# Patient Record
Sex: Female | Born: 1945
Health system: Southern US, Community
[De-identification: ages and names within clinical notes are randomized; demographics above are authoritative.]

## PROBLEM LIST (undated history)

## (undated) DIAGNOSIS — I10 Essential (primary) hypertension: Secondary | ICD-10-CM

## (undated) DIAGNOSIS — I639 Cerebral infarction, unspecified: Secondary | ICD-10-CM

## (undated) DIAGNOSIS — E039 Hypothyroidism, unspecified: Secondary | ICD-10-CM

## (undated) DIAGNOSIS — F419 Anxiety disorder, unspecified: Secondary | ICD-10-CM

## (undated) DIAGNOSIS — M84312A Stress fracture, left shoulder, initial encounter for fracture: Secondary | ICD-10-CM

## (undated) DIAGNOSIS — F329 Major depressive disorder, single episode, unspecified: Secondary | ICD-10-CM

## (undated) DIAGNOSIS — G2581 Restless legs syndrome: Secondary | ICD-10-CM

## (undated) DIAGNOSIS — F32A Depression, unspecified: Secondary | ICD-10-CM

## (undated) DIAGNOSIS — L509 Urticaria, unspecified: Secondary | ICD-10-CM

## (undated) DIAGNOSIS — G4730 Sleep apnea, unspecified: Secondary | ICD-10-CM

## (undated) HISTORY — DX: Hypothyroidism, unspecified: E03.9

## (undated) HISTORY — DX: Restless legs syndrome: G25.81

## (undated) HISTORY — DX: Urticaria, unspecified: L50.9

## (undated) HISTORY — PX: BREAST BIOPSY: SHX20

## (undated) HISTORY — PX: CHOLECYSTECTOMY: SHX55

## (undated) HISTORY — DX: Depression, unspecified: F32.A

## (undated) HISTORY — DX: Essential (primary) hypertension: I10

## (undated) HISTORY — DX: Major depressive disorder, single episode, unspecified: F32.9

## (undated) HISTORY — DX: Anxiety disorder, unspecified: F41.9

## (undated) HISTORY — DX: Sleep apnea, unspecified: G47.30

## (undated) HISTORY — DX: Stress fracture, left shoulder, initial encounter for fracture: M84.312A

---

## 1998-07-08 ENCOUNTER — Encounter: Admission: RE | Admit: 1998-07-08 | Discharge: 1998-07-08 | Payer: Self-pay | Admitting: Family Medicine

## 1998-11-24 ENCOUNTER — Encounter: Admission: RE | Admit: 1998-11-24 | Discharge: 1998-11-24 | Payer: Self-pay | Admitting: Sports Medicine

## 2000-12-03 ENCOUNTER — Encounter (INDEPENDENT_AMBULATORY_CARE_PROVIDER_SITE_OTHER): Payer: Self-pay

## 2000-12-03 ENCOUNTER — Other Ambulatory Visit: Admission: RE | Admit: 2000-12-03 | Discharge: 2000-12-03 | Payer: Self-pay | Admitting: Obstetrics and Gynecology

## 2001-12-09 ENCOUNTER — Ambulatory Visit (HOSPITAL_COMMUNITY): Admission: RE | Admit: 2001-12-09 | Discharge: 2001-12-09 | Payer: Self-pay | Admitting: Internal Medicine

## 2001-12-09 ENCOUNTER — Encounter: Payer: Self-pay | Admitting: Internal Medicine

## 2002-09-09 ENCOUNTER — Encounter: Payer: Self-pay | Admitting: Family Medicine

## 2002-09-09 ENCOUNTER — Ambulatory Visit (HOSPITAL_COMMUNITY): Admission: RE | Admit: 2002-09-09 | Discharge: 2002-09-09 | Payer: Self-pay | Admitting: Family Medicine

## 2002-11-27 HISTORY — PX: COLONOSCOPY: SHX174

## 2002-11-27 HISTORY — PX: ESOPHAGOGASTRODUODENOSCOPY: SHX1529

## 2003-03-04 ENCOUNTER — Ambulatory Visit (HOSPITAL_COMMUNITY): Admission: RE | Admit: 2003-03-04 | Discharge: 2003-03-04 | Payer: Self-pay | Admitting: Internal Medicine

## 2003-12-16 ENCOUNTER — Ambulatory Visit (HOSPITAL_COMMUNITY): Admission: RE | Admit: 2003-12-16 | Discharge: 2003-12-16 | Payer: Self-pay | Admitting: Internal Medicine

## 2004-11-27 HISTORY — PX: GASTRIC ROUX-EN-Y: SHX5262

## 2004-12-07 ENCOUNTER — Ambulatory Visit (HOSPITAL_COMMUNITY): Admission: RE | Admit: 2004-12-07 | Discharge: 2004-12-07 | Payer: Self-pay | Admitting: Internal Medicine

## 2005-02-12 ENCOUNTER — Emergency Department (HOSPITAL_COMMUNITY): Admission: EM | Admit: 2005-02-12 | Discharge: 2005-02-12 | Payer: Self-pay | Admitting: Emergency Medicine

## 2005-02-22 ENCOUNTER — Ambulatory Visit (HOSPITAL_COMMUNITY): Admission: RE | Admit: 2005-02-22 | Discharge: 2005-02-22 | Payer: Self-pay | Admitting: Surgery

## 2005-03-02 ENCOUNTER — Encounter: Admission: RE | Admit: 2005-03-02 | Discharge: 2005-05-31 | Payer: Self-pay | Admitting: Surgery

## 2005-03-14 ENCOUNTER — Ambulatory Visit (HOSPITAL_COMMUNITY): Admission: RE | Admit: 2005-03-14 | Discharge: 2005-03-14 | Payer: Self-pay | Admitting: Surgery

## 2005-04-21 ENCOUNTER — Ambulatory Visit (HOSPITAL_COMMUNITY): Admission: RE | Admit: 2005-04-21 | Discharge: 2005-04-21 | Payer: Self-pay | Admitting: Family Medicine

## 2005-05-04 ENCOUNTER — Ambulatory Visit: Payer: Self-pay | Admitting: Orthopedic Surgery

## 2005-05-09 ENCOUNTER — Inpatient Hospital Stay (HOSPITAL_COMMUNITY): Admission: RE | Admit: 2005-05-09 | Discharge: 2005-05-12 | Payer: Self-pay | Admitting: Surgery

## 2005-06-16 ENCOUNTER — Encounter: Admission: RE | Admit: 2005-06-16 | Discharge: 2005-09-14 | Payer: Self-pay | Admitting: Obstetrics and Gynecology

## 2005-07-03 ENCOUNTER — Ambulatory Visit: Payer: Self-pay | Admitting: Orthopedic Surgery

## 2005-07-13 ENCOUNTER — Ambulatory Visit: Payer: Self-pay | Admitting: Orthopedic Surgery

## 2005-07-18 ENCOUNTER — Encounter (HOSPITAL_COMMUNITY): Admission: RE | Admit: 2005-07-18 | Discharge: 2005-08-17 | Payer: Self-pay | Admitting: Orthopedic Surgery

## 2005-08-17 ENCOUNTER — Ambulatory Visit: Payer: Self-pay | Admitting: Orthopedic Surgery

## 2005-09-21 ENCOUNTER — Encounter: Admission: RE | Admit: 2005-09-21 | Discharge: 2005-11-26 | Payer: Self-pay | Admitting: Surgery

## 2006-01-04 ENCOUNTER — Encounter: Admission: RE | Admit: 2006-01-04 | Discharge: 2006-04-04 | Payer: Self-pay | Admitting: Surgery

## 2008-06-15 ENCOUNTER — Ambulatory Visit: Payer: Self-pay | Admitting: Orthopedic Surgery

## 2008-06-15 DIAGNOSIS — M7150 Other bursitis, not elsewhere classified, unspecified site: Secondary | ICD-10-CM | POA: Insufficient documentation

## 2008-10-14 ENCOUNTER — Ambulatory Visit: Payer: Self-pay | Admitting: Orthopedic Surgery

## 2008-10-14 DIAGNOSIS — M67919 Unspecified disorder of synovium and tendon, unspecified shoulder: Secondary | ICD-10-CM | POA: Insufficient documentation

## 2008-10-14 DIAGNOSIS — M25519 Pain in unspecified shoulder: Secondary | ICD-10-CM | POA: Insufficient documentation

## 2008-10-14 DIAGNOSIS — M719 Bursopathy, unspecified: Secondary | ICD-10-CM

## 2009-01-14 ENCOUNTER — Encounter (HOSPITAL_COMMUNITY): Admission: RE | Admit: 2009-01-14 | Discharge: 2009-02-13 | Payer: Self-pay | Admitting: Internal Medicine

## 2010-02-07 ENCOUNTER — Ambulatory Visit: Payer: Self-pay | Admitting: Orthopedic Surgery

## 2010-02-07 DIAGNOSIS — M6530 Trigger finger, unspecified finger: Secondary | ICD-10-CM | POA: Insufficient documentation

## 2010-02-07 DIAGNOSIS — D518 Other vitamin B12 deficiency anemias: Secondary | ICD-10-CM | POA: Insufficient documentation

## 2010-02-10 ENCOUNTER — Encounter: Payer: Self-pay | Admitting: Orthopedic Surgery

## 2010-02-10 ENCOUNTER — Encounter (INDEPENDENT_AMBULATORY_CARE_PROVIDER_SITE_OTHER): Payer: Self-pay | Admitting: *Deleted

## 2010-06-02 ENCOUNTER — Ambulatory Visit (HOSPITAL_COMMUNITY): Admission: RE | Admit: 2010-06-02 | Discharge: 2010-06-02 | Payer: Self-pay | Admitting: Orthopedic Surgery

## 2010-06-02 ENCOUNTER — Encounter: Payer: Self-pay | Admitting: Orthopedic Surgery

## 2010-06-06 ENCOUNTER — Ambulatory Visit: Payer: Self-pay | Admitting: Orthopedic Surgery

## 2010-06-06 DIAGNOSIS — M23329 Other meniscus derangements, posterior horn of medial meniscus, unspecified knee: Secondary | ICD-10-CM | POA: Insufficient documentation

## 2010-06-06 DIAGNOSIS — M1710 Unilateral primary osteoarthritis, unspecified knee: Secondary | ICD-10-CM | POA: Insufficient documentation

## 2010-06-06 DIAGNOSIS — IMO0002 Reserved for concepts with insufficient information to code with codable children: Secondary | ICD-10-CM

## 2010-09-14 ENCOUNTER — Ambulatory Visit: Payer: Self-pay | Admitting: Orthopedic Surgery

## 2010-10-12 ENCOUNTER — Encounter: Payer: Self-pay | Admitting: Orthopedic Surgery

## 2010-12-01 ENCOUNTER — Ambulatory Visit (HOSPITAL_COMMUNITY)
Admission: RE | Admit: 2010-12-01 | Discharge: 2010-12-01 | Payer: Self-pay | Source: Home / Self Care | Attending: Internal Medicine | Admitting: Internal Medicine

## 2010-12-06 ENCOUNTER — Ambulatory Visit
Admission: RE | Admit: 2010-12-06 | Discharge: 2010-12-06 | Payer: Self-pay | Source: Home / Self Care | Attending: Orthopedic Surgery | Admitting: Orthopedic Surgery

## 2010-12-18 ENCOUNTER — Encounter: Payer: Self-pay | Admitting: Internal Medicine

## 2010-12-27 NOTE — Letter (Signed)
Summary: External Other  External Other   Imported By: Elvera Maria 02/11/2010 10:41:13  _____________________________________________________________________  External Attachment:    Type:   Image     Comment:   Dr. Chestine Spore appt confimation

## 2010-12-27 NOTE — Assessment & Plan Note (Signed)
Summary: TRIGGER FINGER RECURRING/RT THUMB/BCBS/CAF   Visit Type:  Follow-up Referring Provider:  self Primary Provider:  Dr. Sherwood Gambler  CC:  RIGHT TRIGGER THUMB.  History of Present Illness: I saw Virginia [name] in the office today for a followup visit.  She is a 65 years old woman with the complaint of:  right trigger thumb  Last injection was 02-07-10. Patient states that it helped for several months.  RT THUMB trigger injection  Recurrent trigger finger the patient here for her fourth injection.  We discussed the risk of rupture and she wishes to proceed with injection.  We offered surgical treatment she would rather not  Clicking and locking noted with tenderness over the A1 pulley and nodularity at that level neurovascular exam normal.  She does have good flexion power at the IP joint  Injection given A1 pulley RIGHT thumb standard technique Verbal consent was obtained: The finger was prepped with ethyl chloride and injected with 1:1 injection of .25% sensorcaine, 1cc  and 40 mg of depomedrol, 1cc. There were no complications.   Allergies: 1)  ! Augmentin   Impression & Recommendations:  Problem # 1:  TRIGGER FINGER, THUMB (ICD-727.03) Assessment Deteriorated  Orders: Est. Patient Level III (16109) Injection, Tendon / Ligament (60454) Depo- Medrol 40mg  (J1030)  Patient Instructions: 1)  You have received an injection of cortisone today. You may experience increased pain at the injection site. Apply ice pack to the area for 20 minutes every 2 hours and take 2 xtra strength tylenol every 8 hours. This increased pain will usually resolve in 24 hours. The injection will take effect in 3-10 days.  2)  Please schedule a follow-up appointment as needed.   Orders Added: 1)  Est. Patient Level III [09811] 2)  Injection, Tendon / Ligament [20550] 3)  Depo- Medrol 40mg  [J1030]

## 2010-12-27 NOTE — Assessment & Plan Note (Signed)
Summary: needs injection rt thumb triggering seen before for this no x...   Visit Type:  Follow-up  CC:  right trigger thumb.  History of Present Illness: I saw Virginia [name] in the office today for a followup visit.  She is a 65 years old woman with the complaint of:  right trigger thumb.  Pain for 6 months. with history of clicking and locking.  Previous injection did well about 2 years ago    Problems Prior to Update: 1)  Anemia, B12 Deficiency  (ICD-281.1) 2)  Bursitis, Shoulder  (ICD-726.10) 3)  Shoulder Pain  (ICD-719.41) 4)  Trigger Finger  (ICD-727.3)  Allergies (verified): 1)  ! Augmentin  Past History:  Past Medical History: Last updated: 10/14/2008   Hypothyroidism Sinusitis  Past Surgical History: Last updated: 10/14/2008 Gall bladder removed gastric bypass  Review of Systems Endocrine:  she had several questions about her osteoporosis and her vitamin D metabolism she will be referred to [facility] endocrinologist [facility] for further care regarding that.  Physical Exam  Additional Exam:  RIGHT upper extremity examination there is no swelling in the RIGHT upper extremity.  There is tenderness over the A1 pulley of the RIGHT thumb.  She demonstrates clicking and catching with symptoms in the IP joint and over the A1 pulley.  She has good flexion power in the thumb and the joint is stable.  She has good capillary refill the skin is without rash or lesion and capillary refill his normal   Impression & Recommendations:  Problem # 1:  TRIGGER FINGER, THUMB (ICD-727.03) Assessment Deteriorated  I injected the A1 pulley of the RIGHT thumb Verbal consent was obtained: The finger was prepped with ethyl chloride and injected with 1:1 injection of .25% sensorcaine, 1cc  and 40 mg of depomedrol, 1cc. There were no complications.  Orders: Est. Patient Level III (16109) Joint Aspirate / Injection, Small (60454) Depo- Medrol 40mg  (J1030)  Other  Orders: Endocrinology Referral (Endocrine)  Patient Instructions: 1)  You have received an injection of cortisone today. You may experience increased pain at the injection site. Apply ice pack to the area for 20 minutes every 2 hours and take 2 xtra strength tylenol every 8 hours. This increased pain will usually resolve in 24 hours. The injection will take effect in 3-10 days.  2)    3)  Please schedule a follow-up appointment as needed.

## 2010-12-27 NOTE — Miscellaneous (Signed)
Summary: appt Dr. Chestine Spore 03/17/10 at noon, lmom for pt  Clinical Lists Changes

## 2010-12-27 NOTE — Miscellaneous (Signed)
Summary: faxed referral to see endocrinologist [facility]  Clinical Lists Changes

## 2010-12-27 NOTE — Assessment & Plan Note (Signed)
Summary: LEFT KNEE PAIN NEEDS XR/BCBS/BSF   Vital Signs:  Patient profile:   65 year old female Height:      64 inches Weight:      172 pounds Pulse rate:   80 / minute Resp:     16 per minute  Visit Type:  new problem Referring Provider:  self Primary Provider:  Dr. Sherwood Gambler  CC:  left knee pain.  History of Present Illness: I saw Virginia [name] in the office today for a visit.  She is a 66 years old woman with the complaint of:  left knee.  Pain is somewhat better today.  Xrays [facility] left knee on 06/02/10.  No injury.  Meds: prozac. synthroid, fosamax, vitamin d, tylenol, Oxycodone  was given a few weeks back  as well as IM shot in hip, some relief.  Pain and LEFT knee for approximate 6 weeks. No injury. Denies catching, locking, but reports swelling severe medial knee pain, which came on gradually. Pain is 8/10, relief with oxycodone. Pain is described as dull.       Allergies: 1)  ! Augmentin  Past History:  Past Surgical History: Last updated: 10/14/2008 Gall bladder removed gastric bypass  Family History: Last updated: 06/06/2010 FH of Cancer:  Family History Coronary Heart Disease female < 26 Family History of Arthritis Hx, family, chronic respiratory condition  Social History: Last updated: 06/06/2010 Patient is divorced.  retired no smoking no alcohol tea occasionally  Past Medical History: anxiety depression Hypothyroidism Sinusitis osteoporosis  Family History: Reviewed history and no changes required. FH of Cancer:  Family History Coronary Heart Disease female < 20 Family History of Arthritis Hx, family, chronic respiratory condition  Social History: Reviewed history and no changes required. Patient is divorced.  retired no smoking no alcohol tea occasionally  Review of Systems  The review of systems is negative for Constitutional, Cardiovascular, Respiratory, Gastrointestinal, Genitourinary, Neurologic, Musculoskeletal, Endocrine,  Psychiatric, Skin, HEENT, Immunology, and Hemoatologic.  Physical Exam  Skin:  intact without lesions or rashes Inguinal Nodes:  no significant adenopathy Psych:  alert and cooperative; normal mood and affect; normal attention span and concentration   Knee Exam  General:    Well-developed, well-nourished, normal body habitus; no deformities, normal grooming.  Gait:    favors the LEFT leg when walking  Palpation:    tenderness R-medial joint line.    Vascular:    There was no swelling or varicose veins. The pulses and temperature are normal. There was no edema or tenderness.  Sensory:    Gross coordination and sensation were normal.    Motor:    Motor strength 5/5 bilaterally for quadriceps, hamstrings, ankle dorsiflexion, and ankle plantar flexion.    Reflexes:    Normal and symmetric patellar and Achilles reflexes bilaterally.    Knee Exam:    Right:    Inspection:  Normal    Palpation:  Normal    Left:    Inspection:  Abnormal    Palpation:  Abnormal    medial joint line is tender. McMurray sign is positive at pain that 130 of flexion. She had full extension. All ligaments were stable   Impression & Recommendations: I think his meniscal tear. X-rays show mild arthritic changes with some lateral facet. Trochlear joint space narrowing.  MRI not ordered yet.  [facility] done at [facility]  Other Orders: Est. Patient Level IV (16109)  Patient Instructions: 1)  Exercises and ice fas needed  2)  continue the Oxycodone as needed  3)  if not any better after 4 weeks then get the MRI

## 2010-12-27 NOTE — Letter (Signed)
Summary: History form  History form   Imported By: Jacklynn Ganong 06/10/2010 09:57:32  _____________________________________________________________________  External Attachment:    Type:   Image     Comment:   External Document

## 2010-12-29 NOTE — Assessment & Plan Note (Signed)
Summary: left knee pain recurring/no xrays needed/bcbs.cbt   Visit Type:  Follow-up Referring Provider:  self Primary Provider:  Dr. Sherwood Gambler  CC:  left knee pain.  History of Present Illness: I saw Virginia [name] in the office today for a followup visit.  She is a 65 years old woman with the complaint of:  left knee pain recurring.  MRI taken left knee [facility] 12/01/10 for review.  IMPRESSION:   1.  Radial tear of the posterior horn of the medial meniscus and immediately adjacent the meniscal root. 2.  Moderate knee effusion with moderate Baker's cyst. 3.  Chondral thinning and mild chondral irregularity of the medial femoral condylar articular cartilage.    Using Tylenol for pain.  She had an injection of steroid in her RIGHT hip 2 weeks ago, which seemed to help.  Currently, she is not symptomatic, but she went to her primary care's office and he ordered an MRI. When I was on vacation. As noted above, shows a tear. The medial meniscus. Right now she is not symptomatic. 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  She denies any catching at this time or locking. Giving way symptoms occur occasionally           Allergies: 1)  ! Augmentin  Physical Exam  Additional Exam:  LEFT knee evaluation.  She's going to x3. Her overall appearance is normal. She has a normal mood and affect. She has no limp when she walks.  Inspection reveals tenderness along the medial joint line with normal range of motion. The knee feels stable. Muscle strength is normal. The skin is intact. The temperature of the leg is normal. There is no peripheral edema. Sensation is normal on the LEFT leg. Coordination and balance were normal and she ambulated.  She did not have a positive screw home, test or Apley's test, but she did have a positive McMurray's test   Impression & Recommendations:  Problem # 1:  DERANGEMENT OF POSTERIOR HORN OF MEDIAL  MENISCUS (ICD-717.2) Assessment Unchanged  Orders: Est. Patient Level III (16109)  Problem # 2:  ARTHRITIS, LEFT KNEE (ICD-716.96) Assessment: Unchanged  again arthroscopy is indicated, but she wants to wait until taxis and has finished, which is fine. We'll see her in March however, she calls back for a shot. She can come in RIGHT away.  Orders: Est. Patient Level III (60454)  Patient Instructions: 1)  come back mid march  2)  if she calls and needs a shot she can come in right away   Orders Added: 1)  Est. Patient Level III [09811]

## 2011-01-27 ENCOUNTER — Encounter: Payer: Self-pay | Admitting: Orthopedic Surgery

## 2011-02-07 ENCOUNTER — Telehealth: Payer: Self-pay | Admitting: Orthopedic Surgery

## 2011-02-07 ENCOUNTER — Encounter: Payer: Self-pay | Admitting: Orthopedic Surgery

## 2011-02-08 ENCOUNTER — Ambulatory Visit: Payer: Self-pay | Admitting: Orthopedic Surgery

## 2011-02-14 NOTE — Progress Notes (Signed)
Summary: patient cancelled appointment, feels much better  Phone Note Call from Patient   Caller: Patient Summary of Call: Patient called to cancel appointment for tomorrow, 02/08/11, for re-evaluation of knee.  States her pain is gone; states will call to re-schedule when she needs to return. Initial call taken by: Cammie Sickle,  February 07, 2011 8:55 AM

## 2011-04-14 NOTE — Consult Note (Signed)
NAME:  Virginia [name], Virginia [name]                         ACCOUNT NO.:  [id number]   MEDICAL RECORD NO.:  [id number]                   PATIENT TYPE:  OUT   LOCATION:  [facility]                                  FACILITY:  [facility]   PHYSICIAN:  R. Roetta Sessions, M.D.              DATE OF BIRTH:  [birth year]   DATE OF CONSULTATION:  DATE OF DISCHARGE:  09/09/2002                                   CONSULTATION   CHIEF COMPLAINT:  Hemoccult positive stool, GERD.   HISTORY OF PRESENT ILLNESS:  The patient is a pleasant 65 year old lady  kindly referred through the courtesy of Dr. Artis Delay to further evaluate  hemoccult positive stool on digital rectal exam plus 1/3 mail-in cards  positive.  She was originally found to be hemoccult positive by Dr. August Saucer  down in [place] during part of her annual examination.  She has not had  any melena or rectal bleeding.  She has a tendency towards diarrhea after  eating, particularly since her gallbladder was taken out 10 years ago.  She  does not describe any melena, has had no abdominal pain.  She has had  longstanding reflux symptoms for which she has taken various acid  suppression agents over the years - more recently Nexium 40 mg orally daily.  She has no odynophagia, no dysphagia.  She has not lost any weight.  She has  never had her lower GI tract evaluated.  Her family history is significant  in that her father had colonic polyps.  Reflux symptoms are worse after  eating a variety of foods.  Fasting ameliorates her symptoms (including her  diarrhea).   PAST MEDICAL HISTORY:  1. Hypothyroidism.  2. Hypertension.  3. Depression.  4. She is on hormone replacement therapy.   PAST SURGICAL HISTORY:  1. Cholecystectomy for cholelithiasis.  2. Cold knife conization.   CURRENT MEDICATIONS:  1. Synthroid 125 mcg daily.  2. Lotensin HCT 10/12.5.  3. Nexium 40 mg daily.  4. Prozac 40 mg daily.  5. Estrace 1 mg daily.  6. Provera 5 mg daily.   ALLERGIES:   No known drug allergies.   FAMILY HISTORY:  Father with colonic polyps, died with lung cancer.  Mother  alive with hypertension and arthritis.   SOCIAL HISTORY:  The patient is divorced, has one daughter.  She is retired  from the [employer] laboratory technician.  No tobacco, no  alcohol.   REVIEW OF SYSTEMS:  No chest pain, no dyspnea.  No fever or chills.  No  weight loss.   PHYSICAL EXAMINATION:  GENERAL:  Reveals a pleasant 65 year old lady,  resting comfortably.  VITAL SIGNS:  Weight 214, height 5 feet 4 inches.  Temperature 98.4, blood  pressure 120/80, pulse 72.  SKIN:  Warm and dry.  There is no jaundice.  HEENT:  No sclerae icterus.  JVD is not prominent.  CHEST:  Lungs  are clear to auscultation.  CARDIAC:  Regular rate and rhythm without murmur, gallop, or rub.  BREAST:  Deferred.  ABDOMEN:  Nondistended.  Positive bowel sounds.  Soft, nontender, without  any obvious mass or organomegaly.  EXTREMITIES:  No edema.  RECTAL:  Deferred to the time of colonoscopy.   IMPRESSION:  The patient is a pleasant 65 year old lady who is hemoccult  positive.  She has a positive family history of colonic polyps.  She needs  to have her lower GI tract evaluated.  As a separate issue, she has a  greater than 10-year history of reflux symptoms that are responsive to acid  suppression therapy.  There are no alarm features.  She has never had her  upper GI tract evaluated previously.   I have offered the patient both a colonoscopy and EGD to be performed at the  same time.  Discussed the potential risks, benefits, and alternatives with  this nice lady.  Questions were answered; she is agreeable.   PLAN:  Will perform endoscopic evaluation in the near future at [facility].  Further recommendations to follow.   I would like to thank Dr. Artis Delay for allowing me to see this nice lady  today.                                               Jonathon Bellows,  M.D.    RMR/MEDQ  D:  02/23/2003  T:  02/23/2003  Job:  [id number]   cc:   Madelin Rear. Sherwood Gambler, M.D.  [address]  [place]  [place] [address]  Fax: [phone]

## 2011-04-14 NOTE — Op Note (Signed)
NAMEARIANNI, GALLEGO             ACCOUNT NO.:  [id number]   MEDICAL RECORD NO.:  [id number]          PATIENT TYPE:  INP   LOCATION:  0165                         FACILITY:  [facility]   PHYSICIAN:  Lorne Skeens. Hoxworth, M.D.DATE OF BIRTH:  1946/02/23   DATE OF PROCEDURE:  05/09/2005  DATE OF DISCHARGE:                                 OPERATIVE REPORT   PROCEDURE:  Intraoperative upper GI endoscopy.   DESCRIPTION OF PROCEDURE:  Upper endoscopy is performed at the completion of  laparoscopic Roux-en-Y gastric bypass by Dr. Ovidio Kin. The Olympus video  endoscope was inserted into the proximal esophagus and then advanced under  direct vision to the EG junction at 44 cm from the incisors. The small  gastric pouch was entered. Staple and suture lines appeared intact and there  was no bleeding. The anastomosis was visualized and appeared patent. The  pouch was then tightly distended with air while Dr. Ezzard Standing clamped the  outlet under saline irrigation and there was no evidence of leak.  Photographs were obtained. The pouch was then decompressed and the scope  withdrawn.       BTH/MEDQ  D:  05/09/2005  T:  05/09/2005  Job:  [id number]

## 2011-04-14 NOTE — Discharge Summary (Signed)
[name], [name]             ACCOUNT NO.:  [id number]   MEDICAL RECORD NO.:  [id number]          PATIENT TYPE:  INP   LOCATION:  1503                         [facility]ACILITY:  [facility]   PHYSICIAN:  Virginia [name], M.D.  DATE OF BIRTH:  [birth year]   DATE OF ADMISSION:  05/09/2005  DATE OF DISCHARGE:  05/12/2005                                 DISCHARGE SUMMARY   DISCHARGE DIAGNOSES:  1.  Morbid obesity with a body mass index of approximately 38.5, weight 225      pounds.  2.  Hypertension.  3.  Sleep apnea, on CPAP.  4.  Gastroesophageal reflux disease.  5.  Irritable bowel syndrome.  6.  Corrected hypothyroidism.  7.  Restless legs syndrome.  8.  Depression.   OPERATION PERFORMED:  The patient had a laparoscopic roux-en-Y  gastrojejunostomy with esophagogastrojejunoscopy on May 09, 2005.   HISTORY OF ILLNESS:  Virginia [name] is a 65 year old white female patient of  Dr. Elfredia Nevins of [place] who has been morbidly obese much of her  adult life. She has tried multiple diets without success. She has been  through our preoperative bariatric program which has included screening with  labs, nutrition, psychiatry, and now comes to the hospital for roux-Y  gastric bypass.   HOSPITAL COURSE:  On the day of admission, the patient underwent an  anticolic, antigastric laparoscopic roux-en-Y gastrojejunostomy. She also  had an esophagogastrojejunoscopy for documenting patency of her anastomosis.   She did well with the surgery. On her postoperative day #1 her white blood  count was 13,900; hemoglobin of 12; hematocrit 35. Her Dopplers of her lower  extremities showed no evidence of DVT and her Gastrografin swallow showed no  evidence of leak. She was started on liquids.   On postoperative day #2 she was advanced to our half liquid/half protein  diet. Her white blood count was up a little bit. However, she was kept to  postoperative day #3. She is now three days postoperative. She is  afebrile;  her pulse is 79; her white blood count is 12,100; and she is ready for  discharge.   DISCHARGE INSTRUCTIONS:  Include resuming her home medications which  include:  1.  Lotensin HCT 10/12.5 daily.  2.  Synthroid 0.15 mg daily.  3.  Requip 1 mg nightly.  4.  Xanax p.r.n.  5.  She is given Roxicet liquid for pain.   She is to be on the bariatric diet. She has follow-up both with me in a week  and nutrition in a week for advancing her diet. She knows she can shower.  Her activity will be limited as far as driving for 4 or 5 days, no heavy  lifting for a week or two.   DISCHARGE CONDITION:  Good.       DHN/MEDQ  D:  05/12/2005  T:  05/12/2005  Job:  [id number]   cc:   Virginia Rear. Sherwood Gambler, MD  [address]  [place]  [place] [address]  Fax: [phone]   Virginia [name], M.D.  [phone] [address]  [place]  [place] [address]  Fax:  [phone] 

## 2011-04-14 NOTE — Op Note (Signed)
NAMECARALYNN, Virginia [name]             ACCOUNT NO.:  [id number]   MEDICAL RECORD NO.:  [id number]          PATIENT TYPE:  INP   LOCATION:  0003                         FACILITY:  [facility]   PHYSICIAN:  Sandria Bales. Ezzard Standing, M.D.  DATE OF BIRTH:  [birth year]   DATE OF PROCEDURE:  05/09/2005  DATE OF DISCHARGE:                                 OPERATIVE REPORT   PREOPERATIVE DIAGNOSES:  Morbid obesity with a BMI of 38.5, weight of 225  pounds.   POSTOPERATIVE DIAGNOSES:  Morbid obesity with a weight of 225 pounds, BMI of  38.5.   PROCEDURE:  Laparoscopic roux-en-Y gastrojejunostomy (antecolic-antegastric)  and esophagogastrojejunoscopy by Lorne Skeens. Hoxworth, M.D.   SURGEON:  Sandria Bales. Ezzard Standing, M.D.   FIRST ASSISTANT:  Sharlet Salina T. Hoxworth, M.D.   ANESTHESIA:  General endotracheal.   ESTIMATED BLOOD LOSS:  Minimal.   INDICATIONS FOR PROCEDURE:  Ms. Garoutte is a 65 year old white female who  has morbid obesity with a BMI of approximately 38.5, a weight of 225 pounds.  She has multiple comorbid problems associated with her weight which include  hypertension, sleep apnea, gastroesophageal reflux and history of  depression.   She now comes for attempted laparoscopic roux-en-Y gastrojejunostomy.   The indications and potential complications were explained to the patient.  The potential complications but not limited to bleeding, infection, bowel  leak, deep venous thrombosis, long-term nutritional consequences.   The patient now comes for attempted laparoscopic roux-en-Y  gastrojejunostomy.   OPERATIVE NOTE:   The patient placed in a supine position, given a general endotracheal  anesthetic. Of note, she had had nausea and vomiting the night before from  the bowel prep and was given a liter of fluid upon arrival because of some  mild dehydration. She had preoperative subcu heparin and IV Cefoxitin as  antibiotics. She had a Foley catheter in place, PAS stockings in place. Her  abdomen was  prepped with Betadine solution and sterilely draped.   The abdomen was accessed with an Optiview from [employer] in the left upper  quadrant and this was a 12 mm trocar. I used a total of 7 [employer] trocars.  There was a 5 mm left lateral subcostal, there was a 12 mm left subcostal  which was the Optiview. There was a 12 mm left paramedian trocar, there was  a left 12 mm right paramedian, 12 mm right subcostal. There was a 10 mm down  beside the right of the umbilicus for scope and then there was a 5 mm placed  in the submucosal location for the liver retractor. My first attention was  abdominal exploration. The right and left lobe of the liver were  unremarkable, the stump was unremarkable. The bowel that could be seen was  unremarkable. She did have some adhesions to the right upper quadrant and I  took these down with the harmonic scalpel. These were mainly omental  adhesions probably associated with a prior laparoscopic cholecystectomy.   I then identified the ligament of Treitz, counted 40 cm distally and divided  the small bowel using an EndoGIA stapler white load. I marked the gastric  limb with a Penrose drain and then counted 100 cm for the future gastric  limb.   I then did a side to side jejunojejunostomy. The anastomosis was done to the  right side of the main small bowel limb. I used a single firing of the white  load of the EndoGIA 45 stapler, closed the enterotomy with two running 2-0  Vicryl sutures. There was one weakness in the anastomosis and I put another  stitch of 2-0 Vicryl sutures. I then closed my mesenteric defect with a silk  suture which had a Laparoty on both ends and this closed the mesenteric  defect.   I then placed Tisseel over the jejunojejunostomy.   I then turned my attention to the upper abdomen. I placed a Nathanson  retractor to retract the liver. First I took down the angle of Hiss and then  went along the lesser curvature and divided the stomach  approximately 5 cm  beyond the gastroesophageal junction.   I used first a 45 mm stapler transversely and then I used three loads of the  60 mm staplers fashioning and tried to make a tubular pouch which was  approximately 5 cm long, along 3 cm wide.   With the gastric remnant completely divided from the new stomach pouch, I  inspected the staple line, there was no bleeding at any site. He did have  one bleeding vessel along the lesser curvature and I placed both clips and  harmonic scalpel and controlled this bleeding site.   I then over sewed the gastric remnant with a running 2-0 Vicryl suture with  laparoty's on both ends and I placed Tisseel along the new stomach pouch  greater curvature.   I was now ready for the gastrojejunostomy. I did place my candy cane to the  patient's right and brought this up and sewed this with a running posterior  2-0 Vicryl suture. I used the white load of the EndoGIA stapler to form my  gastrojejunostomy. I closed the enterotomy with two running 2-0 Vicryl  sutures. I then advanced the Ewald tube through my anastomosis to make sure  that I had at least a patent anastomosis.   I closed again the gastrojejunostomy enterotomy with two 2-0 Vicryl sutures  and then placed an anterior 2-0 Vicryl suture to make a second layer along  the gastrojejunostomy.   At this point, I reinspected my jejunojejunostomy. I reinspected how the  gastric limb lay. It was under no tension and  the serosa looked viable. I  then reinspected the gastrojejunostomy. I placed a clamp across the jejunum,  outflow track of the gastrojejunostomy while Dr. Johna Sheriff went to the head  of the bed and endoscoped the patient from above.   While he endoscoped the patient, I flooded the upper abdomen with saline.  There was no evidence of any leak. He identified the gastrojejunal anastomosis at about 40 cm, the esophagogastric junction at 39 cm for an  approximately 5 cm pouch and there  was no bleeding within the pouch and he  will dictate this portion of the operation.   Because I felt the operation had gone well and the anastomoses looked good,  I decided against leaving a drain in. I did watch the Nathanson retractor  come out, I watched the trocar that was removed, there was no bleeding in  the  trocar site. The skin was then closed with skin staples at each site. Then  the wounds were sterilely dressed with 4  x 4's and Hypafix. The patient  tolerated the procedure well and was transported to the recovery room in  good condition. Sponge, needle and instrument counts were correct at the end  of the case.       DHN/MEDQ  D:  05/09/2005  T:  05/09/2005  Job:  [id number]   cc:   Madelin Rear. Sherwood Gambler, MD  [address]  [place]  [place] [address]  Fax: [phone]   S. Kyra Manges, M.D.  [phone]  [place]  [place] [address]  Fax: [phone]

## 2011-04-14 NOTE — Op Note (Signed)
NAME:  Virginia [name], Virginia [name]                         ACCOUNT NO.:  [id number]   MEDICAL RECORD NO.:  [id number]                   PATIENT TYPE:  AMB   LOCATION:  DAY                                  FACILITY:  [facility]   PHYSICIAN:  R. Roetta Sessions, M.D.              DATE OF BIRTH:  [birth year]   DATE OF PROCEDURE:  03/04/2003  DATE OF DISCHARGE:                                 OPERATIVE REPORT   PROCEDURE:  Diagnostic esophagogastroduodenoscopy followed by a colonoscopy.   INDICATIONS FOR PROCEDURE:  The patient is a 65 year old lady with  longstanding gastroesophageal reflux symptoms, well controlled on Nexium,  found to be Hemoccult positive recently.  She is undergoing colonoscopy to  further evaluate Hemoccult positive stool as well as EGD particularly in  view of her longstanding gastroesophageal reflux symptoms.  This approach  has been discussed with the patient previously.  Potential risks, benefits,  and alternatives have been reviewed and questions answered.  She is  agreeable.  Please see my 02/23/2003, consultation.   PROCEDURE NOTE:  O2 saturation, blood pressure, pulse, respirations were  monitored throughout the entire procedure.   CONSCIOUS SEDATION:  Demerol 50 mg IV, Versed 4 mg IV in divided doses.  Cetacaine spray for topical oropharyngeal anesthesia.   INSTRUMENT USED:  Olympus video chip adult gastroscope and colonoscope.   PROCEDURE #1, EGD FINDINGS:  Examination of the tubular esophagus revealed  normal esophageal mucosa.  EG junction was easily traversed.   Stomach:  The gastric cavity was empty and insufflated well with air.  Thorough examination of the gastric mucosa including a retroflex view of the  proximal stomach, esophagogastric junction demonstrated only a small hiatal  hernia.  The pylorus was patent and easily traversed.   Duodenum:  The bulb and second portion appeared normal.   THERAPY/DIAGNOSTIC MANEUVERS PERFORMED:  None.   The patient  tolerated the procedure well and was prepared for colonoscopy.   PROCEDURE #2, COLONOSCOPY:  Digital rectal examination revealed no  abnormalities.   Her prep was good.   Rectum:  Examination of the rectal mucosa including retroflex view of the  anal verge revealed anal papilla and internal hemorrhoids.  Otherwise,  rectal mucosa appeared normal.   Colon:  The colonic mucosa was surveyed from the rectosigmoid junction to  the left, transverse, and right colon to the appendiceal orifice and  ileocecal valve and cecum.  The patient was noted to have a 6-mm polyp on a  stalk at the splenic flexure.  There was also left-sided diverticulum.  The  colonic mucosa appeared normal to the cecum.  The cecum, ileocecal valve,  appendiceal orifice were well seen and photographed for the record.  From  this level the scope was slowly withdrawn and all previously mentioned  mucosal surfaces were again seen.  No other abnormalities were observed.  The polyp at the splenic flexure was resected with the snare and recovered  through the scope without apparent complications.  The patient tolerated  both procedures very well and was reactive at end of endoscopy.   EGD IMPRESSION:  Small hiatal hernia.  Remainder of upper gastrointestinal  tract appeared normal.   COLONOSCOPY IMPRESSION:  Internal hemorrhoids, anal papilla.  Otherwise,  normal rectum.  Left-sided diverticulum.  Polyp at splenic flexure.  Remainder of the colonic mucosa appeared normal, status post polypectomy as  described above.   RECOMMENDATIONS:  1. Continue Nexium 40 mg daily for gastroesophageal reflux disease.  2. No aspirin/arthritis medications for 10 days.  Follow up on path from     polyp removed today.  3. Diverticulosis literature provided to the patient.  4. Further recommendations to follow.                                               Virginia [name], M.D.    RMR/MEDQ  D:  03/04/2003  T:  03/04/2003  Job:   [id number]   cc:   Madelin Rear. Sherwood Gambler, M.D.  [address]  [place]  [place] [address]  Fax: [phone]

## 2011-04-25 ENCOUNTER — Encounter: Payer: Self-pay | Admitting: Internal Medicine

## 2011-04-25 NOTE — Progress Notes (Signed)
Patient called back and decided that she would go ahead and have colonoscopy done. I explained to her again that she would need an OV first and she agreed to come in tomorrow 04/26/11 @ 2 pm and see LSL.

## 2011-04-25 NOTE — Progress Notes (Signed)
I received referral from [facility] that patient needed OV due to 6mm polyp found in 2004. I called patient and she said she hasn't even been to her PCP in some time and doesn't understand why they sent [place] a referral. I verified patient's last TCS as 03/04/2003 and explained to patient that with a history of polyps that our doctor's like to follow up with them every 5 years. She said that someone from our office called her and told her with the type of polyp she had that she didn't need to worry about coming back in 5 years and she could wait 10 years to have her next colonoscopy. Patient asked to wait until then to follow up.

## 2011-04-26 ENCOUNTER — Encounter: Payer: Self-pay | Admitting: Gastroenterology

## 2011-04-26 ENCOUNTER — Ambulatory Visit (INDEPENDENT_AMBULATORY_CARE_PROVIDER_SITE_OTHER): Payer: Federal, State, Local not specified - PPO | Admitting: Gastroenterology

## 2011-04-26 DIAGNOSIS — Z860100 Personal history of colon polyps, unspecified: Secondary | ICD-10-CM | POA: Insufficient documentation

## 2011-04-26 DIAGNOSIS — Z8371 Family history of colonic polyps: Secondary | ICD-10-CM

## 2011-04-26 NOTE — Progress Notes (Signed)
Primary Care Physician:  Cassell Smiles., MD  Primary Gastroenterologist:  Roetta Sessions, MD  Chief Complaint  Patient presents with  . Colon Cancer Screening  . Colon Polyps    HPI:  JADEA SHIFFER is a 65 y.o. female here to schedule surveillance colonoscopy. Her last colonoscopy was 8 years ago. She has small polyp removed from the splenic flexure. Path report is not available at this time. She has a family history of colon polyps, father. She denies any current problems with her bowels. Denies any blood in the stool or melena. No abdominal pain, heartburn, dysphagia, odynophagia, vomiting, unintentional weight loss. She'll still 100 pounds after her gastric bypass surgery in 2006 but states she's gained 50 pounds back. Heartburn well controlled on Nexium.  Current Outpatient Prescriptions  Medication Sig Dispense Refill  . ALPRAZolam (XANAX) 0.5 MG tablet       . FLUoxetine (PROZAC) 40 MG capsule Take 40 mg by mouth.        . Hormone Cream Base (VERSA HRT BASE BOTANICAL) CREA by Does not apply route.        . Levothyroxine Sodium (SYNTHROID PO) Take 112 mcg by mouth.       . esomeprazole (NEXIUM) 20 MG packet Take 20 mg by mouth.        Marland Kitchen lisinopril-hydrochlorothiazide (PRINZIDE,ZESTORETIC) 10-12.5 MG per tablet Take 1 tablet by mouth.        Marland Kitchen rOPINIRole (REQUIP) 1 MG tablet Take 1 mg by mouth at bedtime.          Allergies as of 04/26/2011 - Review Complete 04/26/2011  Allergen Reaction Noted  . ZOX:[id number]+XBJYNWGNF+AOZHYQMVHQ acid+aspartame      Past Medical History  Diagnosis Date  . Hypothyroidism   . Anxiety   . Depression   . Osteoporosis   . Sleep apnea   . HTN (hypertension)     resolved with weight loss s/p gastric bypass    Past Surgical History  Procedure Date  . Gastric roux-en-y 2006  . Cholecystectomy   . Esophagogastroduodenoscopy 2004    small hiatal hernia  . Colonoscopy 2004    internal hemorrhoids, left-sided diverticulae, splenic  flexure polyp (6mm), path unavailable at this time    Family History  Problem Relation Age of Onset  . Cancer      family history   . Coronary artery disease      family history   . Arthritis      family history   . Lung cancer Father     History   Social History  . Marital Status: Divorced    Spouse Name: N/A    Number of Children: 1  . Years of Education: N/A   Occupational History  . retired    .     Social History Main Topics  . Smoking status: Never Smoker   . Smokeless tobacco: Not on file  . Alcohol Use: No  . Drug Use: No  . Sexually Active: Not on file   Other Topics Concern  . Not on file   Social History Narrative  . No narrative on file      ROS:  General: Negative for anorexia, weight loss, fever, chills, fatigue, weakness. Eyes: Negative for vision changes.  ENT: Negative for hoarseness, difficulty swallowing , nasal congestion. CV: Negative for chest pain, angina, palpitations, dyspnea on exertion, peripheral edema.  Respiratory: Negative for dyspnea at rest, dyspnea on exertion, cough, sputum, wheezing.  GI: See history of present illness. GU:  Negative  for dysuria, hematuria, urinary incontinence, urinary frequency, nocturnal urination.  MS: Negative for joint pain, low back pain.  Derm: Negative for rash or itching.  Neuro: Negative for weakness, abnormal sensation, seizure, frequent headaches, memory loss, confusion.  Psych: Negative for anxiety, depression, suicidal ideation, hallucinations.  Endo: Negative for unusual weight change.  Heme: Negative for bruising or bleeding. Allergy: Negative for rash or hives.    Physical Examination:  BP 153/81  Pulse 77  Temp(Src) 97.4 F (36.3 C) (Temporal)  Ht 5\' 3"  (1.6 m)  Wt 179 lb 9.6 oz (81.466 kg)  BMI 31.81 kg/m2   General: Well-nourished, well-developed in no acute distress.  Head: Normocephalic, atraumatic.   Eyes: Conjunctiva pink, no icterus. Mouth: Oropharyngeal mucosa moist  and pink , no lesions erythema or exudate. Neck: Supple without thyromegaly, masses, or lymphadenopathy.  Lungs: Clear to auscultation bilaterally.  Heart: Regular rate and rhythm, no murmurs rubs or gallops.  Abdomen: Bowel sounds are normal, nontender, nondistended, no hepatosplenomegaly or masses, no abdominal bruits or    hernia , no rebound or guarding.   Extremities: No lower extremity edema.  Neuro: Alert and oriented x 4 , grossly normal neurologically.  Skin: Warm and dry, no rash or jaundice.   Psych: Alert and cooperative, normal mood and affect.

## 2011-04-26 NOTE — Assessment & Plan Note (Signed)
Personal history of colonic polyps, last colonoscopy 8 years ago. Family history of colon polyps in father. Recommend updating colonoscopy at this time. Patient does not want to take Trilytely prep, will give her Moviprep.  I have discussed the risks, alternatives, benefits with regards to but not limited to the risk of reaction to medication, bleeding, infection, perforation and the patient is agreeable to proceed. Written consent to be obtained.Marland Kitchen

## 2011-04-27 NOTE — Progress Notes (Signed)
Cc to PCP 

## 2011-04-28 HISTORY — PX: COLONOSCOPY: SHX174

## 2011-05-10 ENCOUNTER — Ambulatory Visit (HOSPITAL_COMMUNITY)
Admission: RE | Admit: 2011-05-10 | Discharge: 2011-05-10 | Disposition: A | Discharge status: Home / Self Care | Payer: Federal, State, Local not specified - PPO | Source: Ambulatory Visit | Attending: Internal Medicine | Admitting: Internal Medicine

## 2011-05-10 ENCOUNTER — Other Ambulatory Visit: Payer: Self-pay | Admitting: Internal Medicine

## 2011-05-10 ENCOUNTER — Encounter: Payer: Federal, State, Local not specified - PPO | Admitting: Internal Medicine

## 2011-05-10 DIAGNOSIS — K644 Residual hemorrhoidal skin tags: Secondary | ICD-10-CM | POA: Insufficient documentation

## 2011-05-10 DIAGNOSIS — I10 Essential (primary) hypertension: Secondary | ICD-10-CM | POA: Insufficient documentation

## 2011-05-10 DIAGNOSIS — Z860100 Personal history of colon polyps, unspecified: Secondary | ICD-10-CM

## 2011-05-10 DIAGNOSIS — Z1211 Encounter for screening for malignant neoplasm of colon: Secondary | ICD-10-CM

## 2011-05-10 DIAGNOSIS — K5730 Diverticulosis of large intestine without perforation or abscess without bleeding: Secondary | ICD-10-CM

## 2011-05-10 DIAGNOSIS — Z79899 Other long term (current) drug therapy: Secondary | ICD-10-CM | POA: Insufficient documentation

## 2011-05-10 DIAGNOSIS — D128 Benign neoplasm of rectum: Secondary | ICD-10-CM | POA: Insufficient documentation

## 2011-05-10 DIAGNOSIS — K648 Other hemorrhoids: Secondary | ICD-10-CM | POA: Insufficient documentation

## 2011-05-10 DIAGNOSIS — D129 Benign neoplasm of anus and anal canal: Secondary | ICD-10-CM

## 2011-05-10 DIAGNOSIS — Z800 Family history of malignant neoplasm of digestive organs: Secondary | ICD-10-CM | POA: Insufficient documentation

## 2011-05-11 NOTE — Progress Notes (Signed)
TCS < 10 YEARS DUE TO FAM HX POLYPS

## 2011-06-12 NOTE — Op Note (Signed)
  NAME:  Virginia [name], DOBROWOLSKI             ACCOUNT NO.:  [id number]  MEDICAL RECORD NO.:  [id number]  LOCATION:  DAYP                          FACILITY:  [facility]  PHYSICIAN:  R. Roetta Sessions, MD FACP FACGDATE OF BIRTH:  03/31/1946  DATE OF PROCEDURE:  05/10/2011 DATE OF DISCHARGE:                              OPERATIVE REPORT   INDICATIONS FOR PROCEDURE:  A 65 year old lady with history of colonic polyps last colonoscopy 8 years ago.  Positive family history of colon polyps.  No lower GI tract symptoms, currently here for surveillance colonoscopy.  Risks, benefits, limitations, alternatives and imponderables have discussed, questions answered.  Please see the documentation in the medical record.  PROCEDURE NOTE:  O2 saturation, blood pressure, pulse and respirations were monitored throughout the entire procedure.  CONSCIOUS SEDATION: 1. Versed 6 mg IV. 2. Demerol 75 mg IV in divided doses.  INSTRUMENT:  Pentax video chip system.  FINDINGS:  Digital rectal exam revealed external anal papilla and hemorrhoids.  Endoscopic findings:  Prep was adequate.  Colon:  Colonic mucosa was surveyed from the rectosigmoid junction through the left transverse right colon through the appendical surface, ileocecal valve/cecum.  These structures were well seen and photographed for the record.  Terminal ileum was intubated to 5-cm.  From this level, scope was slowly and consciously withdrawn.  All previously mentioned mucosal surfaces were again seen.  The patient was noted to have left-sided diverticula.  The remainder of the colonic mucosa as well as terminal ileal mucosa appeared normal.  Scope was pulled down into the rectum, where a thorough examination of the rectal mucosa including retroflexed view of the anal verge demonstrated some anal papilla and minimal internal hemorrhoids as well as a pedunculated 5-mm polyp at 5-cm from the anal verge.  This polyp was removed with hot snare cautery  technique and was recovered through the scope.  The patient tolerated the procedure well.  Cecal withdrawal time 8 minutes.  IMPRESSION: 1. Internal and external hemorrhoids and anal papilla, polyp in the     rectum resected with snare polypectomy technique as described     above, left-sided diverticula and colonic mucosa and terminal     mucosa appeared     normal.  Literature on polyps and diverticulosis provided to Ms.     Sullivant. 2. Followup on path. 3. Further recommendations to follow.     Jonathon Bellows, MD FACP [facility]     RMR/MEDQ  D:  05/10/2011  T:  05/10/2011  Job:  [id number]  cc:   Madelin Rear. Sherwood Gambler, MD Fax: [phone]  Electronically Signed by Lorrin Goodell M.D. on 06/12/2011 08:40:40 AM

## 2012-06-28 ENCOUNTER — Inpatient Hospital Stay (HOSPITAL_COMMUNITY): Payer: Federal, State, Local not specified - PPO

## 2012-06-28 ENCOUNTER — Emergency Department (HOSPITAL_COMMUNITY): Payer: Federal, State, Local not specified - PPO

## 2012-06-28 ENCOUNTER — Encounter (HOSPITAL_COMMUNITY): Payer: Self-pay | Admitting: *Deleted

## 2012-06-28 ENCOUNTER — Inpatient Hospital Stay (HOSPITAL_COMMUNITY)
Admission: EM | Admit: 2012-06-28 | Discharge: 2012-07-02 | DRG: 014 | Disposition: A | Discharge status: Home / Self Care | Payer: Federal, State, Local not specified - PPO | Attending: Internal Medicine | Admitting: Internal Medicine

## 2012-06-28 DIAGNOSIS — F411 Generalized anxiety disorder: Secondary | ICD-10-CM

## 2012-06-28 DIAGNOSIS — M810 Age-related osteoporosis without current pathological fracture: Secondary | ICD-10-CM | POA: Diagnosis present

## 2012-06-28 DIAGNOSIS — I639 Cerebral infarction, unspecified: Secondary | ICD-10-CM | POA: Diagnosis present

## 2012-06-28 DIAGNOSIS — E039 Hypothyroidism, unspecified: Secondary | ICD-10-CM | POA: Diagnosis present

## 2012-06-28 DIAGNOSIS — E785 Hyperlipidemia, unspecified: Secondary | ICD-10-CM | POA: Diagnosis present

## 2012-06-28 DIAGNOSIS — Z9884 Bariatric surgery status: Secondary | ICD-10-CM

## 2012-06-28 DIAGNOSIS — R209 Unspecified disturbances of skin sensation: Secondary | ICD-10-CM | POA: Diagnosis present

## 2012-06-28 DIAGNOSIS — I6350 Cerebral infarction due to unspecified occlusion or stenosis of unspecified cerebral artery: Principal | ICD-10-CM | POA: Diagnosis present

## 2012-06-28 DIAGNOSIS — I10 Essential (primary) hypertension: Secondary | ICD-10-CM

## 2012-06-28 DIAGNOSIS — F3289 Other specified depressive episodes: Secondary | ICD-10-CM | POA: Diagnosis present

## 2012-06-28 DIAGNOSIS — F419 Anxiety disorder, unspecified: Secondary | ICD-10-CM | POA: Diagnosis present

## 2012-06-28 DIAGNOSIS — R030 Elevated blood-pressure reading, without diagnosis of hypertension: Secondary | ICD-10-CM | POA: Diagnosis present

## 2012-06-28 DIAGNOSIS — Z79899 Other long term (current) drug therapy: Secondary | ICD-10-CM

## 2012-06-28 DIAGNOSIS — F329 Major depressive disorder, single episode, unspecified: Secondary | ICD-10-CM | POA: Diagnosis present

## 2012-06-28 LAB — URINALYSIS, ROUTINE W REFLEX MICROSCOPIC
Bilirubin Urine: NEGATIVE
Ketones, ur: NEGATIVE mg/dL
Nitrite: NEGATIVE
Specific Gravity, Urine: <1.005 — ABNORMAL LOW (ref 1.005–1.030)
Urobilinogen, UA: 0.2 mg/dL (ref 0.0–1.0)
pH: 5.5 (ref 5.0–8.0)

## 2012-06-28 LAB — URINE MICROSCOPIC-ADD ON

## 2012-06-28 LAB — CBC
Hemoglobin: 12.6 g/dL (ref 12.0–15.0)
MCH: 28.8 pg (ref 26.0–34.0)
MCHC: 32.1 g/dL (ref 30.0–36.0)
Platelets: 301 10*3/uL (ref 150–400)
RDW: 13.4 % (ref 11.5–15.5)

## 2012-06-28 LAB — COMPREHENSIVE METABOLIC PANEL
ALT: 21 U/L (ref 0–35)
Albumin: 3.4 g/dL — ABNORMAL LOW (ref 3.5–5.2)
Alkaline Phosphatase: 129 U/L — ABNORMAL HIGH (ref 39–117)
Calcium: 9.5 mg/dL (ref 8.4–10.5)
Potassium: 4.2 mEq/L (ref 3.5–5.1)
Sodium: 137 mEq/L (ref 135–145)
Total Protein: 7.2 g/dL (ref 6.0–8.3)

## 2012-06-28 LAB — DIFFERENTIAL
Basophils Absolute: 0 10*3/uL (ref 0.0–0.1)
Basophils Relative: 1 % (ref 0–1)
Eosinophils Absolute: 0.5 10*3/uL (ref 0.0–0.7)
Eosinophils Relative: 7 % — ABNORMAL HIGH (ref 0–5)
Lymphs Abs: 2.1 10*3/uL (ref 0.7–4.0)
Neutrophils Relative %: 50 % (ref 43–77)

## 2012-06-28 LAB — TROPONIN I: Troponin I: <0.3 ng/mL (ref <0.30)

## 2012-06-28 LAB — CK TOTAL AND CKMB (NOT AT ARMC): Relative Index: INVALID (ref 0.0–2.5)

## 2012-06-28 MED ORDER — SENNOSIDES-DOCUSATE SODIUM 8.6-50 MG PO TABS: 1.0000 | ORAL_TABLET | Freq: Every evening | ORAL | Status: DC | PRN

## 2012-06-28 MED ORDER — ALPRAZOLAM 0.5 MG PO TABS
0.5000 mg | ORAL_TABLET | Freq: Three times a day (TID) | ORAL | Status: DC | PRN
  Administered 2012-06-29: 0.5 mg via ORAL
  Filled 2012-06-28: qty 1

## 2012-06-28 MED ORDER — ONDANSETRON HCL 4 MG/2ML IJ SOLN
4.0000 mg | Freq: Three times a day (TID) | INTRAMUSCULAR | Status: AC | PRN
Start: 2012-06-28 — End: 2012-06-29

## 2012-06-28 MED ORDER — ENOXAPARIN SODIUM 30 MG/0.3ML ~~LOC~~ SOLN
30.0000 mg | Freq: Two times a day (BID) | SUBCUTANEOUS | Status: DC
  Administered 2012-06-28 – 2012-07-01 (×7): 30 mg via SUBCUTANEOUS
  Filled 2012-06-28 (×9): qty 0.3

## 2012-06-28 MED ORDER — FLUOXETINE HCL 20 MG PO CAPS
40.0000 mg | ORAL_CAPSULE | ORAL | Status: DC
  Administered 2012-06-29 – 2012-07-01 (×3): 40 mg via ORAL
  Filled 2012-06-28 (×5): qty 2

## 2012-06-28 MED ORDER — LEVOTHYROXINE SODIUM 112 MCG PO TABS
112.0000 ug | ORAL_TABLET | ORAL | Status: DC
  Administered 2012-06-29 – 2012-07-01 (×3): 112 ug via ORAL
  Filled 2012-06-28 (×5): qty 1

## 2012-06-28 MED ORDER — ASPIRIN-DIPYRIDAMOLE ER 25-200 MG PO CP12
1.0000 | ORAL_CAPSULE | Freq: Every day | ORAL | Status: DC
  Filled 2012-06-28: qty 1

## 2012-06-28 MED ORDER — ADULT MULTIVITAMIN W/MINERALS CH
1.0000 | ORAL_TABLET | Freq: Every morning | ORAL | Status: DC
  Administered 2012-06-29 – 2012-07-01 (×3): 1 via ORAL
  Filled 2012-06-28 (×4): qty 1

## 2012-06-28 MED ORDER — LORAZEPAM 1 MG PO TABS
1.0000 mg | ORAL_TABLET | Freq: Once | ORAL | Status: AC
  Administered 2012-06-28: 1 mg via ORAL
  Filled 2012-06-28: qty 1

## 2012-06-28 MED ORDER — SODIUM CHLORIDE 0.9 % IV SOLN
INTRAVENOUS | Status: AC
  Administered 2012-06-28: 21:00:00 via INTRAVENOUS

## 2012-06-28 MED ORDER — FERROUS SULFATE 325 (65 FE) MG PO TABS
325.0000 mg | ORAL_TABLET | ORAL | Status: DC
  Administered 2012-06-29 – 2012-07-01 (×3): 325 mg via ORAL
  Filled 2012-06-28 (×5): qty 1

## 2012-06-28 MED ORDER — ONDANSETRON HCL 4 MG/2ML IJ SOLN: 4.0000 mg | Freq: Four times a day (QID) | INTRAMUSCULAR | Status: DC | PRN

## 2012-06-28 MED ORDER — SIMVASTATIN 40 MG PO TABS
40.0000 mg | ORAL_TABLET | Freq: Every day | ORAL | Status: DC
  Administered 2012-06-29 – 2012-07-01 (×3): 40 mg via ORAL
  Filled 2012-06-28 (×4): qty 1

## 2012-06-28 NOTE — ED Provider Notes (Signed)
History     CSN: [id number]  Arrival date & time 06/28/12  1355   First MD Initiated Contact with Patient 06/28/12 1413      Chief Complaint  Patient presents with  . Numbness    HPI Pt was seen at 1435.  Per pt, c/o gradual onset and worsening of persistent left lower facial and left hand "numbness" for the past 3 days.  Pt states her symptoms have been intermittent for the past 3 days, lasting several minutes before self-resolving.  Pt states her symptoms have been constant since she woke up this morning approx 0900.  Pt describes her symptoms as her left lower face (nose, left tongue and upper/lower lips) and "entire left hand" are "numb." States over the course of the day today her symptoms have remained constant; though she now feels her left hand symptoms "have improved so that just my thumb is numb."  Denies focal motor weakness, no fevers, no neck pain, no headache, no visual changes, no dysphagia, no dysarthria, no facial droop, no CP/SOB, no abd pain.    Past Medical History  Diagnosis Date  . Hypothyroidism   . Anxiety   . Depression   . Osteoporosis   . Sleep apnea   . HTN (hypertension)     resolved with weight loss s/p gastric bypass    Past Surgical History  Procedure Date  . Gastric roux-en-y 2006  . Cholecystectomy   . Esophagogastroduodenoscopy 2004    small hiatal hernia  . Colonoscopy 2004    internal hemorrhoids, left-sided diverticulae, splenic flexure polyp (6mm), path unavailable at this time    Family History  Problem Relation Age of Onset  . Cancer      family history   . Coronary artery disease      family history   . Arthritis      family history   . Lung cancer Father     History  Substance Use Topics  . Smoking status: Never Smoker   . Smokeless tobacco: Not on file  . Alcohol Use: No    Review of Systems ROS: Statement: All systems negative except as marked or noted in the HPI; Constitutional: Negative for fever and chills. ; ;  Eyes: Negative for eye pain, redness and discharge. ; ; ENMT: Negative for ear pain, hoarseness, nasal congestion, sinus pressure and sore throat. ; ; Cardiovascular: Negative for chest pain, palpitations, diaphoresis, dyspnea and peripheral edema. ; ; Respiratory: Negative for cough, wheezing and stridor. ; ; Gastrointestinal: Negative for nausea, vomiting, diarrhea, abdominal pain, blood in stool, hematemesis, jaundice and rectal bleeding. . ; ; Genitourinary: Negative for dysuria, flank pain and hematuria. ; ; Musculoskeletal: Negative for back pain and neck pain. Negative for swelling and trauma.; ; Skin: Negative for pruritus, rash, abrasions, blisters, bruising and skin lesion.; ; Neuro: +paresthesias. Negative for headache, lightheadedness and neck stiffness. Negative for weakness, altered level of consciousness , altered mental status, extremity weakness, involuntary movement, seizure and syncope.     Allergies  Amoxicillin-pot clavulanate  Home Medications   Current Outpatient Rx  Name Route Sig Dispense Refill  . ALPRAZOLAM 0.5 MG PO TABS Oral Take 0.5 mg by mouth daily as needed. For anxiety and/or sleep    . CALTRATE 600 PLUS-VIT D PO Oral Take 1 tablet by mouth every morning.    Marland Kitchen VITAMIN D PO Oral Take 1 tablet by mouth every morning.    Marland Kitchen VITAMIN B-12 PO Oral Take 1 tablet by mouth every  morning.    Marland Kitchen FERROUS SULFATE 325 (65 FE) MG PO TABS Oral Take 325 mg by mouth every morning.    Marland Kitchen FLUOXETINE HCL 40 MG PO CAPS Oral Take 40 mg by mouth every morning.     Marland Kitchen LEVOTHYROXINE SODIUM 112 MCG PO TABS Oral Take 112 mcg by mouth every morning.    . ADULT MULTIVITAMIN W/MINERALS CH Oral Take 1 tablet by mouth every morning.      BP 133/75  Pulse 60  Temp 98.6 F (37 C) (Oral)  Resp 14  Ht 5\' 4"  (1.626 m)  Wt 186 lb (84.369 kg)  BMI 31.93 kg/m2  SpO2 99%  Physical Exam 1440: Physical examination:  Nursing notes reviewed; Vital signs and O2 SAT reviewed;  Constitutional: Well  developed, Well nourished, Well hydrated, In no acute distress; Head:  Normocephalic, atraumatic; Eyes: EOMI, PERRL, No scleral icterus; ENMT: Mouth and pharynx normal, Mucous membranes moist; Neck: Supple, Full range of motion, No lymphadenopathy; Cardiovascular: Regular rate and rhythm, No murmur, rub, or gallop; Respiratory: Breath sounds clear & equal bilaterally, No rales, rhonchi, wheezes.  Speaking full sentences with ease, Normal respiratory effort/excursion; Chest: Nontender, Movement normal; Abdomen: Soft, Nontender, Nondistended, Normal bowel sounds;; Extremities: Pulses normal, No tenderness, No edema, No calf edema or asymmetry. Sensation intact over left deltoid region, distal NMS intact with left hand having intact and equal sensation and strength in the distribution of the median, radial, and ulnar nerve function to RUE/right hand.  Strong radial pulses.;; Neuro: AA&Ox3, Major CN grossly intact.  Speech clear. Strength 5/5 equal bilat UE's and LE's.  DTR 2/4 equal bilat UE's and LE's.  +mild decreased sensation to left lower face, otherwise no gross sensory deficits.  Normal cerebellar testing bilat UE's (finger-nose) and LE's (heel-shin).  No facial droop.  No nystagmus.;; Skin: Color normal, Warm, Dry.   ED Course  Procedures   1445:  Pt is NOT code stroke nor TPA candidate because pt woke up with symptoms this morning and feels they are improving (left hand numbness) as the day progressed.   1705:  No change in neuro assessment.  Passed swallowing screen.  Dx testing d/w pt and family.  Questions answered.  Verb understanding, agreeable to transfer/admit to [facility].  T/C to [facility] Neuro Dr. Thad Ranger, case discussed, including:  HPI, pertinent PM/SHx, VS/PE, dx testing, ED course and treatment:  Agreeable to consult, requests to admit to [facility] at [facility].    1715:  T/C to [facility] Dr. Izola Price, case discussed, including:  HPI, pertinent PM/SHx, VS/PE, dx testing, ED course and  treatment:  Agreeable to admit, requests to obtain neuro bed on 3000.    MDM  MDM Reviewed: nursing note, vitals and previous chart Reviewed previous: ECG Interpretation: ECG, labs, x-ray, MRI and CT scan    Date: 06/28/2012  Rate: 63  Rhythm: normal sinus rhythm  QRS Axis: normal  Intervals: normal  ST/T Wave abnormalities: normal  Conduction Disutrbances:none  Narrative Interpretation:   Old EKG Reviewed: unchanged; no significant changes from previous EKG dated 02/23/2012.   Results for orders placed during the hospital encounter of 06/28/12  [facility]      Component Value Range   Prothrombin Time 13.0  11.6 - 15.2 seconds   INR 0.96  0.00 - 1.49  APTT      Component Value Range   aPTT 34  24 - 37 seconds  CBC      Component Value Range   WBC 7.0  4.0 - 10.5 K/uL   RBC 4.38  3.87 - 5.11 MIL/uL   Hemoglobin 12.6  12.0 - 15.0 g/dL   HCT 40.9  81.1 - 91.4 %   MCV 89.5  78.0 - 100.0 fL   MCH 28.8  26.0 - 34.0 pg   MCHC 32.1  30.0 - 36.0 g/dL   RDW 78.2  95.6 - 21.3 %   Platelets 301  150 - 400 K/uL  DIFFERENTIAL      Component Value Range   Neutrophils Relative 50  43 - 77 %   Neutro Abs 3.5  1.7 - 7.7 K/uL   Lymphocytes Relative 30  12 - 46 %   Lymphs Abs 2.1  0.7 - 4.0 K/uL   Monocytes Relative 13 (*) 3 - 12 %   Monocytes Absolute 0.9  0.1 - 1.0 K/uL   Eosinophils Relative 7 (*) 0 - 5 %   Eosinophils Absolute 0.5  0.0 - 0.7 K/uL   Basophils Relative 1  0 - 1 %   Basophils Absolute 0.0  0.0 - 0.1 K/uL  COMPREHENSIVE METABOLIC PANEL      Component Value Range   Sodium 137  135 - 145 mEq/L   Potassium 4.2  3.5 - 5.1 mEq/L   Chloride 101  96 - 112 mEq/L   CO2 27  19 - 32 mEq/L   Glucose, Bld 87  70 - 99 mg/dL   BUN 6  6 - 23 mg/dL   Creatinine, Ser 0.86  0.50 - 1.10 mg/dL   Calcium 9.5  8.4 - 57.8 mg/dL   Total Protein 7.2  6.0 - 8.3 g/dL   Albumin 3.4 (*) 3.5 - 5.2 g/dL   AST 26  0 - 37 U/L   ALT 21  0 - 35 U/L   Alkaline Phosphatase 129 (*) 39 - 117  U/L   Total Bilirubin 0.3  0.3 - 1.2 mg/dL   GFR calc non Af Amer 88 (*) >90 mL/min   GFR calc Af Amer >90  >90 mL/min  CK TOTAL AND CKMB      Component Value Range   Total CK 88  7 - 177 U/L   CK, MB 2.0  0.3 - 4.0 ng/mL   Relative Index RELATIVE INDEX IS INVALID  0.0 - 2.5  TROPONIN I      Component Value Range   Troponin I <0.30  <0.30 ng/mL  URINALYSIS, ROUTINE W REFLEX MICROSCOPIC      Component Value Range   Color, Urine YELLOW  YELLOW   APPearance CLEAR  CLEAR   Specific Gravity, Urine <1.005 (*) 1.005 - 1.030   pH 5.5  5.0 - 8.0   Glucose, UA NEGATIVE  NEGATIVE mg/dL   Hgb urine dipstick NEGATIVE  NEGATIVE   Bilirubin Urine NEGATIVE  NEGATIVE   Ketones, ur NEGATIVE  NEGATIVE mg/dL   Protein, ur NEGATIVE  NEGATIVE mg/dL   Urobilinogen, UA 0.2  0.0 - 1.0 mg/dL   Nitrite NEGATIVE  NEGATIVE   Leukocytes, UA MODERATE (*) NEGATIVE  URINE MICROSCOPIC-ADD ON      Component Value Range   Squamous Epithelial / LPF MANY (*) RARE   WBC, UA 11-20  <3 WBC/hpf   Bacteria, UA FEW (*) RARE   Ct Head Wo Contrast 06/28/2012  *RADIOLOGY REPORT*  Clinical Data: Numbness.  Left facial and arm numbness which began yesterday.  CT HEAD WITHOUT CONTRAST  Technique:  Contiguous axial images were obtained from the base of the skull  through the vertex without contrast.  Comparison: None  Findings: There is patchy low density within the subcortical and periventricular white matter consistent with chronic small vessel ischemic change.  The brain has an otherwise normal appearance without evidence for hemorrhage, infarction, hydrocephalus, or mass lesion.  There is no extra axial fluid collection.  The skull and paranasal sinuses are normal.  There is mucosal thickening involving the ethmoid air cells.  The remaining paranasal sinuses are clear.  The mastoid air cells are clear.  The skull is intact.  IMPRESSION:  1.  No acute intracranial abnormalities. 2. Small vessel ischemic disease  Original Report  Authenticated By: Rosealee Albee, M.D.   Mr Brain Wo Contrast 06/28/2012  *RADIOLOGY REPORT*  Clinical Data: Numbness of the left side of the body.  MRI HEAD WITHOUT CONTRAST  Technique:  Multiplanar, multiecho pulse sequences of the brain and surrounding structures were obtained according to standard protocol without intravenous contrast.  Comparison: Head CT same day  Findings:  there is a 5 mm acute infarction in the lateral thalamus on the right.  I think there may be a small subacute infarction in the central medulla as well.  No other acute infarction.  There are extensive chronic small vessel changes within the pons.  There are old small vessel infarctions within the cerebellum.  There are old chronic small vessel infarctions affecting the thalami, basal ganglia and the cerebral hemispheric deep white matter.  No large vessel territory infarction.  No mass lesion, hemorrhage, hydrocephalus or extra-axial collection.  No pituitary mass.  There is mild mucosal inflammation of the paranasal sinuses.  Major vessels at the base of the brain show flow.  IMPRESSION: Background pattern of chronic small vessel disease throughout the brain.  5 mm acute infarction within the lateral thalamus on the right. Suspicion of small acute or subacute infarction within the medulla.  Original Report Authenticated By: Thomasenia Sales, M.D.              Laray Anger, DO 06/29/12 [phone]

## 2012-06-28 NOTE — ED Notes (Signed)
Called to give report to [facility] and on hold. Unable to give report at this time

## 2012-06-28 NOTE — ED Notes (Signed)
Patient c/o numbness on left side of body (nose, mouth, and arm) x3 days. Per patient numbness was intermittent yesterday. Denies any weakness. Sent here from Dr Lamar Blinks office via EMS, ambulatory.

## 2012-06-28 NOTE — ED Notes (Addendum)
Sent from Dr. Lamar Blinks office for questionable TIA per office nurse. Numbness to left side of chin,lip and nose. Also intermittent numbness to left hand and fingers. No neuro deficits at this time. Pt states symptoms x 1 1/2 days.

## 2012-06-29 ENCOUNTER — Inpatient Hospital Stay (HOSPITAL_COMMUNITY): Payer: Federal, State, Local not specified - PPO

## 2012-06-29 DIAGNOSIS — R030 Elevated blood-pressure reading, without diagnosis of hypertension: Secondary | ICD-10-CM | POA: Diagnosis present

## 2012-06-29 LAB — LIPID PANEL
HDL: 59 mg/dL (ref >39)
Total CHOL/HDL Ratio: 3.1 RATIO

## 2012-06-29 LAB — CBC
MCH: 28.9 pg (ref 26.0–34.0)
MCV: 89.2 fL (ref 78.0–100.0)
Platelets: 271 10*3/uL (ref 150–400)
RDW: 13.6 % (ref 11.5–15.5)
WBC: 7.3 10*3/uL (ref 4.0–10.5)

## 2012-06-29 LAB — HEMOGLOBIN A1C: Mean Plasma Glucose: 111 mg/dL (ref <117)

## 2012-06-29 LAB — CREATININE, SERUM: GFR calc Af Amer: 83 mL/min — ABNORMAL LOW (ref >90)

## 2012-06-29 MED ORDER — ASPIRIN 325 MG PO TABS
325.0000 mg | ORAL_TABLET | Freq: Every day | ORAL | Status: DC
  Administered 2012-06-29: 325 mg via ORAL
  Filled 2012-06-29 (×2): qty 1

## 2012-06-29 MED ORDER — LORAZEPAM 1 MG PO TABS
1.0000 mg | ORAL_TABLET | Freq: Once | ORAL | Status: AC
  Administered 2012-06-29: 1 mg via ORAL
  Filled 2012-06-29: qty 1

## 2012-06-29 NOTE — H&P (Signed)
Virginia [name] is an 66 y.o. female.   Chief Complaint: Numbness HPI: A 66 [facility] old [facility] with sudden onset of left facial numbness and left hand numbness. Symptoms suddenly started she was feeling wiered but no weakness. She has risk factors for stroke including hypertension and hypothyroidism. She has never had a stroke before and no strong family history of stroke. [facility] done showing a right medullary stroke. She was therefore transferred here for further evaluation. Patient has symptoms started 3 days earlier with intermittent since numbness in the affected areas. She did not take it serious because it was going and coming back. She thought it was her neck discs that were acting out and she was be having some neuropathy. Today however the symptoms have persisted. No other side is affected and she is able to use her arm without a problem. No visual changes no change in her speech.  Past Medical History  Diagnosis Date  . Hypothyroidism   . Anxiety   . Depression   . Osteoporosis   . Sleep apnea   . HTN (hypertension)     resolved with weight loss s/p gastric bypass    Past Surgical History  Procedure Date  . Gastric roux-en-y 2006  . Cholecystectomy   . Esophagogastroduodenoscopy 2004    small hiatal hernia  . Colonoscopy 2004    internal hemorrhoids, left-sided diverticulae, splenic flexure polyp (6mm), path unavailable at this time    Family History  Problem Relation Age of Onset  . Cancer      family history   . Coronary artery disease      family history   . Arthritis      family history   . Lung cancer Father    Social History:  reports that she has never smoked. She does not have any smokeless tobacco history on file. She reports that she does not drink alcohol or use illicit drugs.  Allergies:  Allergies  Allergen Reactions  . Amoxicillin-Pot  Clavulanate Hives and Rash    Medications Prior to Admission  Medication Sig Dispense Refill  . ALPRAZolam (XANAX) 0.5 MG tablet Take 0.5 mg by mouth daily as needed. For anxiety and/or sleep      . Calcium-Vitamin D (CALTRATE 600 PLUS-VIT D PO) Take 1 tablet by mouth every morning.      . Cholecalciferol (VITAMIN D PO) Take 1 tablet by mouth every morning.      . Cyanocobalamin (VITAMIN B-12 PO) Take 1 tablet by mouth every morning.      . ferrous sulfate 325 (65 FE) MG tablet Take 325 mg by mouth every morning.      Marland Kitchen FLUoxetine (PROZAC) 40 MG capsule Take 40 mg by mouth every morning.       Marland Kitchen levothyroxine (SYNTHROID, LEVOTHROID) 112 MCG tablet Take 112 mcg by mouth every morning.      . Multiple Vitamin (MULTIVITAMIN WITH MINERALS) TABS Take 1 tablet by mouth every morning.        Results for orders placed during the hospital encounter of 06/28/12 (from the past 48 hour(s))  URINALYSIS, ROUTINE W REFLEX MICROSCOPIC     Status: Abnormal   Collection Time   06/28/12  2:59 PM      Component Value Range Comment   Color, Urine YELLOW  YELLOW    APPearance CLEAR  CLEAR    Specific Gravity, Urine <  1.005 (*) 1.005 - 1.030    pH 5.5  5.0 - 8.0    Glucose, UA NEGATIVE  NEGATIVE mg/dL    Hgb urine dipstick NEGATIVE  NEGATIVE    Bilirubin Urine NEGATIVE  NEGATIVE    Ketones, ur NEGATIVE  NEGATIVE mg/dL    Protein, ur NEGATIVE  NEGATIVE mg/dL    Urobilinogen, UA 0.2  0.0 - 1.0 mg/dL    Nitrite NEGATIVE  NEGATIVE    Leukocytes, UA MODERATE (*) NEGATIVE   URINE MICROSCOPIC-ADD ON     Status: Abnormal   Collection Time   06/28/12  2:59 PM      Component Value Range Comment   Squamous Epithelial / LPF MANY (*) RARE    WBC, UA 11-20  <3 WBC/hpf    Bacteria, UA FEW (*) RARE   PROTIME-INR     Status: Normal   Collection Time   06/28/12  3:10 PM      Component Value Range Comment   Prothrombin Time 13.0  11.6 - 15.2 seconds    INR 0.96  0.00 - 1.49   APTT     Status: Normal   Collection Time     06/28/12  3:10 PM      Component Value Range Comment   aPTT 34  24 - 37 seconds   CBC     Status: Normal   Collection Time   06/28/12  3:10 PM      Component Value Range Comment   WBC 7.0  4.0 - 10.5 K/uL    RBC 4.38  3.87 - 5.11 MIL/uL    Hemoglobin 12.6  12.0 - 15.0 g/dL    HCT 40.9  81.1 - 91.4 %    MCV 89.5  78.0 - 100.0 fL    MCH 28.8  26.0 - 34.0 pg    MCHC 32.1  30.0 - 36.0 g/dL    RDW 78.2  95.6 - 21.3 %    Platelets 301  150 - 400 K/uL   DIFFERENTIAL     Status: Abnormal   Collection Time   06/28/12  3:10 PM      Component Value Range Comment   Neutrophils Relative 50  43 - 77 %    Neutro Abs 3.5  1.7 - 7.7 K/uL    Lymphocytes Relative 30  12 - 46 %    Lymphs Abs 2.1  0.7 - 4.0 K/uL    Monocytes Relative 13 (*) 3 - 12 %    Monocytes Absolute 0.9  0.1 - 1.0 K/uL    Eosinophils Relative 7 (*) 0 - 5 %    Eosinophils Absolute 0.5  0.0 - 0.7 K/uL    Basophils Relative 1  0 - 1 %    Basophils Absolute 0.0  0.0 - 0.1 K/uL   COMPREHENSIVE METABOLIC PANEL     Status: Abnormal   Collection Time   06/28/12  3:10 PM      Component Value Range Comment   Sodium 137  135 - 145 mEq/L    Potassium 4.2  3.5 - 5.1 mEq/L    Chloride 101  96 - 112 mEq/L    CO2 27  19 - 32 mEq/L    Glucose, Bld 87  70 - 99 mg/dL    BUN 6  6 - 23 mg/dL    Creatinine, Ser 0.86  0.50 - 1.10 mg/dL    Calcium 9.5  8.4 - 57.8 mg/dL    Total Protein 7.2  6.0 -  8.3 g/dL    Albumin 3.4 (*) 3.5 - 5.2 g/dL    AST 26  0 - 37 U/L    ALT 21  0 - 35 U/L    Alkaline Phosphatase 129 (*) 39 - 117 U/L    Total Bilirubin 0.3  0.3 - 1.2 mg/dL    GFR calc non Af Amer 88 (*) >90 mL/min    GFR calc Af Amer >90  >90 mL/min   CK TOTAL AND CKMB     Status: Normal   Collection Time   06/28/12  3:10 PM      Component Value Range Comment   Total CK 88  7 - 177 U/L    CK, MB 2.0  0.3 - 4.0 ng/mL    Relative Index RELATIVE INDEX IS INVALID  0.0 - 2.5   TROPONIN I     Status: Normal   Collection Time   06/28/12  3:10 PM       Component Value Range Comment   Troponin I <0.30  <0.30 ng/mL   CBC     Status: Normal   Collection Time   06/28/12 11:50 PM      Component Value Range Comment   WBC 7.3  4.0 - 10.5 K/uL    RBC 4.43  3.87 - 5.11 MIL/uL    Hemoglobin 12.8  12.0 - 15.0 g/dL    HCT 16.1  09.6 - 04.5 %    MCV 89.2  78.0 - 100.0 fL    MCH 28.9  26.0 - 34.0 pg    MCHC 32.4  30.0 - 36.0 g/dL    RDW 40.9  81.1 - 91.4 %    Platelets 271  150 - 400 K/uL   CREATININE, SERUM     Status: Abnormal   Collection Time   06/28/12 11:50 PM      Component Value Range Comment   Creatinine, Ser 0.85  0.50 - 1.10 mg/dL    GFR calc non Af Amer 71 (*) >90 mL/min    GFR calc Af Amer 83 (*) >90 mL/min    Dg Chest 2 View  06/29/2012  *RADIOLOGY REPORT*  Clinical Data: CVA.  CHEST - 2 VIEW  Comparison: 12/07/2004 chest radiograph  Findings: The cardiomediastinal silhouette is unremarkable. There is no evidence of focal airspace disease, pulmonary edema, suspicious pulmonary nodule/mass, pleural effusion, or pneumothorax. No acute bony abnormalities are identified.  IMPRESSION: No evidence of active cardiopulmonary disease.  Original Report Authenticated By: Rosendo Gros, M.D.   Ct Head Wo Contrast  06/28/2012  *RADIOLOGY REPORT*  Clinical Data: Numbness.  Left facial and arm numbness which began yesterday.  CT HEAD WITHOUT CONTRAST  Technique:  Contiguous axial images were obtained from the base of the skull through the vertex without contrast.  Comparison: None  Findings: There is patchy low density within the subcortical and periventricular white matter consistent with chronic small vessel ischemic change.  The brain has an otherwise normal appearance without evidence for hemorrhage, infarction, hydrocephalus, or mass lesion.  There is no extra axial fluid collection.  The skull and paranasal sinuses are normal.  There is mucosal thickening involving the ethmoid air cells.  The remaining paranasal sinuses are clear.  The mastoid air  cells are clear.  The skull is intact.  IMPRESSION:  1.  No acute intracranial abnormalities. 2. Small vessel ischemic disease  Original Report Authenticated By: Rosealee Albee, M.D.   Mr Brain Wo Contrast  06/28/2012  *RADIOLOGY REPORT*  Clinical Data: Numbness of the left side of the body.  MRI HEAD WITHOUT CONTRAST  Technique:  Multiplanar, multiecho pulse sequences of the brain and surrounding structures were obtained according to standard protocol without intravenous contrast.  Comparison: Head CT same day  Findings:  there is a 5 mm acute infarction in the lateral thalamus on the right.  I think there may be a small subacute infarction in the central medulla as well.  No other acute infarction.  There are extensive chronic small vessel changes within the pons.  There are old small vessel infarctions within the cerebellum.  There are old chronic small vessel infarctions affecting the thalami, basal ganglia and the cerebral hemispheric deep white matter.  No large vessel territory infarction.  No mass lesion, hemorrhage, hydrocephalus or extra-axial collection.  No pituitary mass.  There is mild mucosal inflammation of the paranasal sinuses.  Major vessels at the base of the brain show flow.  IMPRESSION: Background pattern of chronic small vessel disease throughout the brain.  5 mm acute infarction within the lateral thalamus on the right. Suspicion of small acute or subacute infarction within the medulla.  Original Report Authenticated By: Thomasenia Sales, M.D.    Review of Systems  Constitutional: Negative.   HENT: Negative.   Eyes: Negative.   Respiratory: Negative.   Cardiovascular: Negative.   Gastrointestinal: Negative.   Genitourinary: Negative.   Musculoskeletal: Negative.   Skin: Negative.   Neurological: Positive for tingling, sensory change and loss of consciousness. Negative for focal weakness.       Numbness of left face and left hand  Psychiatric/Behavioral: Negative.     Blood  pressure 162/86, pulse 69, temperature 97.9 F (36.6 C), temperature source Oral, resp. rate 16, height 5\' 4"  (1.626 m), weight 84.369 kg (186 lb), SpO2 95.00%. Physical Exam  Constitutional: She is oriented to person, place, and time. She appears well-developed and well-nourished.  HENT:  Head: Normocephalic and atraumatic.  Right Ear: External ear normal.  Left Ear: External ear normal.  Nose: Nose normal.  Mouth/Throat: Oropharynx is clear and moist.  Eyes: Conjunctivae and EOM are normal. Pupils are equal, round, and reactive to light.  Neck: Normal range of motion. Neck supple.  Cardiovascular: Normal rate, regular rhythm, normal heart sounds and intact distal pulses.   Respiratory: Effort normal and breath sounds normal.  GI: Soft. Bowel sounds are normal.  Musculoskeletal: Normal range of motion.  Neurological: She is alert and oriented to person, place, and time. She has normal strength and normal reflexes. She displays normal reflexes. A sensory deficit is present. No cranial nerve deficit. She exhibits normal muscle tone. She displays a negative Romberg sign. Coordination normal.  Skin: Skin is warm and dry.  Psychiatric: She has a normal mood and affect. Her behavior is normal. Judgment and thought content normal.     Assessment/Plan 66 year old female presenting with right mid due to CVA. Patient with significant risk factors for coronary artery disease and CVA.  Plan #1 acute stroke: Patient will be admitted for further workup. She had had an MRI done. We'll get MRA, carotid Dopplers, 2-D echocardiogram. Patient will be started on Aggrenox as well as a statin. Neurology will be consulted. She will have stroke education as well.  #2 hypertension: Blood pressure seems reasonable at this point. With active stroke we will not lower it further.   #3 hypothyroidism: We'll check a TSH and continue with levothyroxine.  #4 anxiety disorder: She is stable right now but will  continue  with her anxiolytics from home.    GARBA,LAWAL 06/29/2012, 2:46 AM

## 2012-06-29 NOTE — Evaluation (Signed)
Speech Language Pathology Evaluation Patient Details Name: Virginia [name] MRN: [id number] DOB: [birth year] Today's Date: 06/29/2012 Time: 1330-1350 SLP Time Calculation (min): 20 min  Problem List:  Patient Active Problem List  Diagnosis  . ANEMIA, B12 DEFICIENCY  . ARTHRITIS, LEFT KNEE  . DERANGEMENT OF POSTERIOR HORN OF MEDIAL MENISCUS  . SHOULDER PAIN  . BURSITIS, SHOULDER  . TRIGGER FINGER, THUMB  . TRIGGER FINGER  . Personal history of colonic polyps  . Family history of colonic polyps  . Stroke  . Hypothyroidism  . Anxiety  . Elevated blood pressure reading without diagnosis of hypertension   Past Medical History:  Past Medical History  Diagnosis Date  . Hypothyroidism   . Anxiety   . Depression   . Osteoporosis   . Sleep apnea   . HTN (hypertension)     resolved with weight loss s/p gastric bypass   Past Surgical History:  Past Surgical History  Procedure Date  . Gastric roux-en-y 2006  . Cholecystectomy   . Esophagogastroduodenoscopy 2004    small hiatal hernia  . Colonoscopy 2004    internal hemorrhoids, left-sided diverticulae, splenic flexure polyp (6mm), path unavailable at this time   HPI:  Virginia [name] is an 66 y.o. female who began feeling a tingling sensation of left side mouth, left tip of tongue and left hand digits Wednesday evening, 06/26/12.  Thursday, experienced similar sensations what waxed and waned throughout the day.  Woke Friday am with persistent symptoms.  MRI completed on 06/28/12 indicates 5 mm acute infarction within the lateral thalamus on the right. Suspicion of small acute or subacute infarction within the medulla.  Patient referred for Cognitive Linguistic Evaluation per stroke protocol.  Assessment / Plan / Recommendation Clinical Impression  Cognitive Linguistic evaluation completed and judged to be baseline.  No further Speech Language Pathology warranted .  ST to sign off.      SLP Assessment  Patient does not need any  further Speech Lanaguage Pathology Services    Follow Up Recommendations  None            SLP Evaluation Prior Functioning  Cognitive/Linguistic Baseline: Within functional limits Type of Home: House Lives With: Family;Daughter Available Help at Discharge: Family Education: 12th grade education some college Vocation: Part time employment   Cognition  Overall Cognitive Status: Appears within functional limits for tasks assessed    Comprehension  Auditory Comprehension Overall Auditory Comprehension: Appears within functional limits for tasks assessed Visual Recognition/Discrimination Discrimination: Within Function Limits Reading Comprehension Reading Status: Within funtional limits    Expression Expression Primary Mode of Expression: Verbal Verbal Expression Overall Verbal Expression: Appears within functional limits for tasks assessed Written Expression Dominant Hand: Right Written Expression: Within Functional Limits   Oral / Motor Oral Motor/Sensory Function Overall Oral Motor/Sensory Function: Appears within functional limits for tasks assessed    Moreen Fowler MS, CCC-SLP [phone]   [facility] 06/29/2012, 2:06 PM

## 2012-06-29 NOTE — Progress Notes (Addendum)
PCP: Cassell Smiles., MD  Brief HPI:  A 66 year old female transferred from [facility] who presented with sudden onset of left facial numbness and left hand numbness but no weakness. She has risk factors for stroke including hypertension and hypothyroidism. She has never had a stroke before and no strong family history of stroke. She was seen in the emergency room at [facility] where a MRI was done showing a right medullary stroke. She was therefore transferred to [facility] for further evaluation. Patient had symptoms that started 3 days prior to admission with intermittent numbness in the affected areas. She did not take it serious because it was going and coming back. She thought it was her neck discs that were acting out and she was be having some neuropathy. However the symptoms persisted. No other side is affected and she is able to use her arm without a problem. No visual changes no change in her speech.  Past medical history:  Past Medical History  Diagnosis Date  . Hypothyroidism   . Anxiety   . Depression   . Osteoporosis   . Sleep apnea   . HTN (hypertension)     resolved with weight loss s/p gastric bypass     Consultants: Neurology  Procedures: None  Subjective: Patient states that the numbness in the left arm is better. Still with numbness in the left face. Some weakness is present. Denies any other symptoms.  Objective: Vital signs in last 24 hours: Temp:  [97.8 F (36.6 C)-99.1 F (37.3 C)] 97.9 F (36.6 C) (08/03 1005) Pulse Rate:  [56-82] 69  (08/03 1005) Resp:  [14-18] 18  (08/03 1005) BP: (133-162)/(63-98) 150/74 mmHg (08/03 1005) SpO2:  [94 %-100 %] 100 % (08/03 1005) Weight:  [84.369 kg (186 lb)] 84.369 kg (186 lb) (08/02 1400) Weight change:  Last BM Date: 06/28/12  Intake/Output from previous day:   Intake/Output this shift:    General appearance: alert, cooperative, appears stated age and no distress Head: Normocephalic, without obvious  abnormality, atraumatic Eyes: conjunctivae/corneas clear. PERRL, EOM's intact.  Throat: lips, mucosa, and tongue normal; teeth and gums normal Neck: no adenopathy, no carotid bruit, no JVD, supple, symmetrical, trachea midline and thyroid not enlarged, symmetric, no tenderness/mass/nodules Resp: clear to auscultation bilaterally Cardio: regular rate and rhythm, S1, S2 normal, no murmur, click, rub or gallop GI: soft, non-tender; bowel sounds normal; no masses,  no organomegaly Extremities: extremities normal, atraumatic, no cyanosis or edema Pulses: 2+ and symmetric Skin: Skin color, texture, turgor normal. No rashes or lesions Lymph nodes: Cervical, supraclavicular, and axillary nodes normal. Neurologic: Alert and oriented X 3. Decreased sensation left face. Other cranial nerves normal. Normal strength and tone in upper ext. Left leg is weaker (4-5/5) compared to right. Bicep reflexes sluggish on left. Left patellar was sluggish as well. Normal coordination. No pronator drift.  Lab Results:  Basename 06/28/12 2350 06/28/12 1510  WBC 7.3 7.0  HGB 12.8 12.6  HCT 39.5 39.2  PLT 271 301   BMET  Basename 06/28/12 2350 06/28/12 1510  NA -- 137  K -- 4.2  CL -- 101  CO2 -- 27  GLUCOSE -- 87  BUN -- 6  CREATININE 0.85 0.77  CALCIUM -- 9.5  ALT -- 21    Studies/Results: Dg Chest 2 View  06/29/2012  *RADIOLOGY REPORT*  Clinical Data: CVA.  CHEST - 2 VIEW  Comparison: 12/07/2004 chest radiograph  Findings: The cardiomediastinal silhouette is unremarkable. There is no evidence of focal airspace  disease, pulmonary edema, suspicious pulmonary nodule/mass, pleural effusion, or pneumothorax. No acute bony abnormalities are identified.  IMPRESSION: No evidence of active cardiopulmonary disease.  Original Report Authenticated By: Rosendo Gros, M.D.   Ct Head Wo Contrast  06/28/2012  *RADIOLOGY REPORT*  Clinical Data: Numbness.  Left facial and arm numbness which began yesterday.  CT HEAD  WITHOUT CONTRAST  Technique:  Contiguous axial images were obtained from the base of the skull through the vertex without contrast.  Comparison: None  Findings: There is patchy low density within the subcortical and periventricular white matter consistent with chronic small vessel ischemic change.  The brain has an otherwise normal appearance without evidence for hemorrhage, infarction, hydrocephalus, or mass lesion.  There is no extra axial fluid collection.  The skull and paranasal sinuses are normal.  There is mucosal thickening involving the ethmoid air cells.  The remaining paranasal sinuses are clear.  The mastoid air cells are clear.  The skull is intact.  IMPRESSION:  1.  No acute intracranial abnormalities. 2. Small vessel ischemic disease  Original Report Authenticated By: Rosealee Albee, M.D.   Mr Brain Wo Contrast  06/28/2012  *RADIOLOGY REPORT*  Clinical Data: Numbness of the left side of the body.  MRI HEAD WITHOUT CONTRAST  Technique:  Multiplanar, multiecho pulse sequences of the brain and surrounding structures were obtained according to standard protocol without intravenous contrast.  Comparison: Head CT same day  Findings:  there is a 5 mm acute infarction in the lateral thalamus on the right.  I think there may be a small subacute infarction in the central medulla as well.  No other acute infarction.  There are extensive chronic small vessel changes within the pons.  There are old small vessel infarctions within the cerebellum.  There are old chronic small vessel infarctions affecting the thalami, basal ganglia and the cerebral hemispheric deep white matter.  No large vessel territory infarction.  No mass lesion, hemorrhage, hydrocephalus or extra-axial collection.  No pituitary mass.  There is mild mucosal inflammation of the paranasal sinuses.  Major vessels at the base of the brain show flow.  IMPRESSION: Background pattern of chronic small vessel disease throughout the brain.  5 mm acute  infarction within the lateral thalamus on the right. Suspicion of small acute or subacute infarction within the medulla.  Original Report Authenticated By: Thomasenia Sales, M.D.    Medications:  Scheduled:   . sodium chloride   Intravenous STAT  . dipyridamole-aspirin  1 capsule Oral Daily  . enoxaparin  30 mg Subcutaneous Q12H  . ferrous sulfate  325 mg Oral BH-q7a  . FLUoxetine  40 mg Oral BH-q7a  . levothyroxine  112 mcg Oral BH-q7a  . LORazepam  1 mg Oral Once  . multivitamin with minerals  1 tablet Oral q morning - 10a  . simvastatin  40 mg Oral q1800   Continuous:  EAV:[id number], ondansetron (ZOFRAN) IV, ondansetron (ZOFRAN) IV, senna-docusate  Assessment/Plan:  Active Problems:  Stroke  HTN (hypertension)  Hypothyroidism  Anxiety    Acute CVA involving Right Thalamus and Medulla Patient is stable and has some deficits on the left. Stroke work up in progress. Patient does not take aspirin or any other antiplatelets at home. Will change Aggrenox to ASA. Statin has been initiated.PT/OT pending. Neurology consult pending. EKG showed SR. MRA will be ordered.  Elevated BP She states she used to have HTN but that resolved once she had gastric bypass procedure. But her BP has been  trending upwards over the last many months. Continue to monitor. Do not treat unless significantly elevated.  H/O Hypothyroidism Continue Levothyroxine  Anxiety Disorder Xanax as needed.  H/O Sleep Apnea Not since gastric bypass procedure.  Abnormal UA Asymptomatic. Afebrile. Possibly contaminated. No antibiotics for now.  Code Status Full Code  DVT Prophylaxis Enoxaparin  Disposition: Per PT/OT   LOS: 1 day   [facility]  [facility] Pager [phone] 06/29/2012, 10:07 AM

## 2012-06-29 NOTE — Progress Notes (Signed)
Occupational Therapy Evaluation Patient Details Name: Virginia [name] MRN: [id number] DOB: 29-May-1946 Today's Date: 06/29/2012 Time: 4098-1191 OT Time Calculation (min): 35 min  OT Assessment / Plan / Recommendation Clinical Impression  66 yo s/p apparent R lateral thalamic infarct with resulting sensory deficits L hand and l mouth area. Completed all education. No further Ot needs. thanks for referral.    OT Assessment  Patient does not need any further OT services    Follow Up Recommendations  No OT follow up    Barriers to Discharge      Equipment Recommendations  None recommended by OT    Recommendations for Other Services    Frequency       Precautions / Restrictions Precautions Precautions: None Restrictions Weight Bearing Restrictions: No   Pertinent Vitals/Pain NONE.    ADL  Eating/Feeding: Performed;Independent Where Assessed - Eating/Feeding: Chair Grooming: Performed;Independent Where Assessed - Grooming: Unsupported standing Upper Body Bathing: Simulated;Modified independent Where Assessed - Upper Body Bathing: Unsupported sitting Lower Body Bathing: Simulated;Modified independent Where Assessed - Lower Body Bathing: Unsupported sit to stand Upper Body Dressing: Simulated;Independent Where Assessed - Upper Body Dressing: Unsupported sitting Lower Body Dressing: Simulated;Independent Where Assessed - Lower Body Dressing: Unsupported sit to stand Toilet Transfer: Performed;Modified independent Toilet Transfer Method: Sit to stand;Stand pivot Toilet Transfer Equipment: Comfort height toilet Toileting - Clothing Manipulation and Hygiene: Simulated;Independent Where Assessed - Toileting Clothing Manipulation and Hygiene: Standing Transfers/Ambulation Related to ADLs: independent ADL Comments: overall independent. Discussed home safety regarding sensory deficits - especially increased risk of cutting/buring self. discussed warning s/s  of TIA/CVA and gave pt  pamphlet.     OT Diagnosis:    OT Problem List:   OT Treatment Interventions:     OT Goals Acute Rehab OT Goals OT Goal Formulation:  (EVAL ONLY)  Visit Information  Last OT Received On: 06/29/12 Assistance Needed: +1 PT/OT Co-Evaluation/Treatment: Yes    Subjective Data      Prior Functioning  Vision/Perception  Home Living Lives With: Daughter;Other (Comment);Family Available Help at Discharge: Family;Available 24 hours/day Type of Home: House Home Access: Stairs to enter [employer] of Steps: 4 Entrance Stairs-Rails: Right;Left Home Layout: One level Bathroom Shower/Tub: Forensic scientist: Standard Bathroom Accessibility: Yes How Accessible: Accessible via walker Home Adaptive Equipment: None Prior Function Level of Independence: Independent Able to Take Stairs?: Yes Driving: Yes Vocation: Part time employment Comments: [employer] during tax season Communication Communication: No difficulties Dominant Hand: Right   Vision - Assessment Eye Alignment: Within Functional Limits Perception Perception: Within Functional Limits Praxis Praxis: Intact  Cognition  Overall Cognitive Status: Appears within functional limits for tasks assessed/performed Arousal/Alertness: Awake/alert Orientation Level: Appears intact for tasks assessed Behavior During Session: [facility] for tasks performed    Extremity/Trunk Assessment Right Upper Extremity Assessment RUE ROM/Strength/Tone: Within functional levels RUE Sensation: WFL - Proprioception;Deficits RUE Sensation Deficits: decrased sensation L hand RUE Coordination: WFL - gross/fine motor Left Upper Extremity Assessment LUE ROM/Strength/Tone: Within functional levels LUE Sensation: WFL - Light Touch;WFL - Proprioception LUE Coordination: WFL - gross/fine motor Right Lower Extremity Assessment RLE ROM/Strength/Tone: Within functional levels RLE Sensation: WFL - Light Touch Left Lower  Extremity Assessment LLE ROM/Strength/Tone: Within functional levels LLE Sensation: WFL - Light Touch Trunk Assessment Trunk Assessment: Normal   Mobility Bed Mobility Bed Mobility: Supine to Sit Supine to Sit: 7: Independent Sitting - Scoot to Edge of Bed: 7: Independent Transfers Sit to Stand: 7: Independent Stand to Sit: 7: Independent  Exercise    Balance Balance Balance Assessed: Yes High Level Balance High Level Balance Activites: Direction changes;Turns;Sudden stops;Head turns High Level Balance Comments: no difficulty with any balance activities  End of Session OT - End of Session Activity Tolerance: Patient tolerated treatment well Patient left: in chair;with call bell/phone within reach Nurse Communication: Mobility status;Other (comment) (interest in nutritional consult)  GO     [name],HILLARY 06/29/2012, 2:54 PM [facility], OTR/L  [phone] 06/29/2012

## 2012-06-29 NOTE — Evaluation (Signed)
Physical Therapy Evaluation Patient Details Name: LEDONNA DORMER MRN: [id number] DOB: 1946/09/04 Today's Date: 06/29/2012 Time: 3086-5784 PT Time Calculation (min): 8 min  PT Assessment / Plan / Recommendation Clinical Impression  Pt admitted with R medullar stroke. Pt is baseline with all functional mobility and balance, no acute PT needs. Will not follow. Please reorder if necessary.     PT Assessment  Patent does not need any further PT services    Follow Up Recommendations  No PT follow up       Equipment Recommendations  None recommended by PT          Precautions / Restrictions Precautions Precautions: None Restrictions Weight Bearing Restrictions: No         Mobility  Bed Mobility Bed Mobility: Supine to Sit;Sitting - Scoot to Edge of Bed Supine to Sit: 7: Independent Sitting - Scoot to [place] of Bed: 7: Independent Transfers Transfers: Sit to Stand;Stand to Sit Sit to Stand: 7: Independent Stand to Sit: 7: Independent Ambulation/Gait Ambulation/Gait Assistance: 7: Independent Ambulation Distance (Feet): 100 Feet Assistive device: None Gait Pattern: Within Functional Limits Stairs: No Modified Rankin (Stroke Patients Only) Pre-Morbid Rankin Score: No symptoms Modified Rankin: No significant disability        Visit Information  Last PT Received On: 06/29/12 Assistance Needed: +1    Subjective Data      Prior Functioning  Home Living Lives With: Daughter;Other (Comment);Family Available Help at Discharge: Family;Available 24 hours/day Type of Home: House Home Access: Stairs to enter [employer] of Steps: 4 Entrance Stairs-Rails: Right;Left Home Layout: One level Bathroom Shower/Tub: Forensic scientist: Standard Bathroom Accessibility: Yes How Accessible: Accessible via walker Home Adaptive Equipment: None Prior Function Level of Independence: Independent Able to Take Stairs?: Yes Driving: Yes Vocation:  Part time employment Comments: [employer] during tax season Communication Communication: No difficulties Dominant Hand: Right    Cognition  Overall Cognitive Status: Appears within functional limits for tasks assessed/performed Arousal/Alertness: Awake/alert Orientation Level: Appears intact for tasks assessed Behavior During Session: [facility] for tasks performed    Extremity/Trunk Assessment Right Lower Extremity Assessment RLE ROM/Strength/Tone: Within functional levels RLE Sensation: WFL - Light Touch Left Lower Extremity Assessment LLE ROM/Strength/Tone: Within functional levels LLE Sensation: WFL - Light Touch   Balance Balance Balance Assessed: Yes High Level Balance High Level Balance Activites: Direction changes;Turns;Sudden stops;Head turns High Level Balance Comments: no difficulty with any balance activities  End of Session PT - End of Session Equipment Utilized During Treatment: Gait belt Activity Tolerance: Patient tolerated treatment well Patient left: in chair;with call bell/phone within reach;Other (comment) (with OT) Nurse Communication: Mobility status   Milana Kidney 06/29/2012, 2:19 PM  06/29/2012 Milana Kidney DPT PAGER: [phone] OFFICE: [phone]

## 2012-06-29 NOTE — Consult Note (Signed)
Referring Physician:Krishnan    Chief Complaint: New Right Thalamic CVA  HPI: Virginia [name] is an 66 y.o. female who began feeling a tingling sensation of left side mouth, left tip of tongue and left hand digits Wednesday evening, 06/26/12.  Thursday, experienced similar sensations what waxed and waned throughout the day.  Woke Friday am with persistent symptoms.  Decided to go to ER.    LSN: < Wednesday evening. 06/26/12 tPA Given: no, outside time window.    Past Medical History  Diagnosis Date  . Hypothyroidism   . Anxiety   . Depression   . Osteoporosis   . Sleep apnea   . HTN (hypertension)     resolved with weight loss s/p gastric bypass    Past Surgical History  Procedure Date  . Gastric roux-en-y 2006  . Cholecystectomy   . Esophagogastroduodenoscopy 2004    small hiatal hernia  . Colonoscopy 2004    internal hemorrhoids, left-sided diverticulae, splenic flexure polyp (6mm), path unavailable at this time    Family History  Problem Relation Age of Onset  . Cancer      family history   . Coronary artery disease      family history   . Arthritis      family history   . Lung cancer Father   All family members on maternal side have had TIA/CVA/CAD.  Social History:  No tobacco, IDU, or ETOH use. Living with/caring for mother x 5 months.  Drinks copious sweet tea.  Allergies:  Allergies  Allergen Reactions  . Amoxicillin-Pot Clavulanate Hives and Rash   Medications: Scheduled:   . sodium chloride   Intravenous STAT  . aspirin  325 mg Oral Daily  . enoxaparin  30 mg Subcutaneous Q12H  . ferrous sulfate  325 mg Oral BH-q7a  . FLUoxetine  40 mg Oral BH-q7a  . levothyroxine  112 mcg Oral BH-q7a  . LORazepam  1 mg Oral Once  . LORazepam  1 mg Oral Once  . multivitamin with minerals  1 tablet Oral q morning - 10a  . simvastatin  40 mg Oral q1800  . DISCONTD: dipyridamole-aspirin  1 capsule Oral Daily   ROS: No fever, chills, cough, URI.  No headache,  dizziness, syncope or presyncope.  No chest pain, SOB or palpitations.  No difficulty swallowing, drooling or difficulty talking.  No GI or GU c/o.  No balance difficulties or fall. No edema or new skin lesions. Does snore excessively, tired in daytime, no am headaches, no driving fatigue.  Had OSA and used CPAP prior to gastric bypass.  Lost 100 lbs, has gained 50 back.  Right hand dominant.  Physical Examination: Blood pressure 150/74, pulse 69, temperature 97.9 F (36.6 C), temperature source Oral, resp. rate 18, height 5\' 4"  (1.626 m), weight 84.369 kg (186 lb), SpO2 100.00%. Telemetry: SR  Neurologic Examination: Mental Status: Alert, oriented, thought content appropriate.  Speech fluent without evidence of aphasia. Able to follow 3 step commands without difficulty. Cranial Nerves: II- Visual fields grossly intact. III/IV/VI-Extraocular movements intact.  Pupils reactive bilaterally. V/VII-Smile symmetric VIII-grossly intact IX/X-not assessed XI-bilateral shoulder shrug XII-midline tongue extension Motor: 5/5 RUE/ RLE.  -5/5 proximal LUE, slightly weaker grip on left. -5/5 hip flexor and leg elevation LLE, normal tone and bulk.  No drift. Sensory: light touch diminished left lips to beginning of cheek to just under chin, LUE and LLE especially distal.   Deep Tendon Reflexes: 2+ and symmetric throughout Plantars: Downgoing bilaterally Cerebellar: Normal finger-to-nose  and normal heel-to-shin test. Orbits right hand around left.  Basic Metabolic Panel:  Lab 06/28/12 1610 06/28/12 1510  NA -- 137  K -- 4.2  CL -- 101  CO2 -- 27  GLUCOSE -- 87  BUN -- 6  CREATININE 0.85 0.77  CALCIUM -- 9.5  MG -- --  PHOS -- --   Liver Function Tests:  Lab 06/28/12 1510  AST 26  ALT 21  ALKPHOS 129*  BILITOT 0.3  PROT 7.2  ALBUMIN 3.4*   CBC:  Lab 06/28/12 2350 06/28/12 1510  WBC 7.3 7.0  NEUTROABS -- 3.5  HGB 12.8 12.6  HCT 39.5 39.2  MCV 89.2 89.5  PLT 271 301   Cardiac  Enzymes:  Lab 06/28/12 1510  CKTOTAL 88  CKMB 2.0  CKMBINDEX --  TROPONINI <0.30   Fasting Lipid Panel:  Lab 06/29/12 0500  CHOL 183  HDL 59  LDLCALC 107*  TRIG 87  CHOLHDL 3.1  LDLDIRECT --   Coagulation:  Lab 06/28/12 1510  LABPROT 13.0  INR 0.96   Urinalysis:  Lab 06/28/12 1459  COLORURINE YELLOW  LABSPEC <1.005*  PHURINE 5.5  GLUCOSEU NEGATIVE  HGBUR NEGATIVE  BILIRUBINUR NEGATIVE  KETONESUR NEGATIVE  PROTEINUR NEGATIVE  UROBILINOGEN 0.2  NITRITE NEGATIVE  LEUKOCYTESUR MODERATE*   Studies:   06/29/2012 CHEST - 2 VIEW  Findings: The cardiomediastinal silhouette is unremarkable. There is no evidence of focal airspace disease, pulmonary edema, suspicious pulmonary nodule/mass, pleural effusion, or pneumothorax. No acute bony abnormalities are identified.  IMPRESSION: No evidence of active cardiopulmonary disease. JEFFREY T. HU, M.D.   06/28/2012 CT HEAD WITHOUT CONTRAST  Technique:  Contiguous axial images were obtained from the base of the skull through the vertex without contrast.  Comparison: None  Findings: There is patchy low density within the subcortical and periventricular white matter consistent with chronic small vessel ischemic change.  The brain has an otherwise normal appearance without evidence for hemorrhage, infarction, hydrocephalus, or mass lesion.  There is no extra axial fluid collection.  The skull and paranasal sinuses are normal.  There is mucosal thickening involving the ethmoid air cells.  The remaining paranasal sinuses are clear.  The mastoid air cells are clear.  The skull is intact.  IMPRESSION:  1.  No acute intracranial abnormalities. 2. Small vessel ischemic disease  Rosealee Albee, M.D.   06/28/2012 MRI HEAD WITHOUT CONTRAST  Findings:  There is a 5 mm acute infarction in the lateral thalamus on the right.  I think there may be a small subacute infarction in the central medulla as well.  No other acute infarction.  There are extensive chronic  small vessel changes within the pons.  There are old small vessel infarctions within the cerebellum.  There are old chronic small vessel infarctions affecting the thalami, basal ganglia and the cerebral hemispheric deep white matter.  No large vessel territory infarction.  No mass lesion, hemorrhage, hydrocephalus or extra-axial collection.  No pituitary mass.  There is mild mucosal inflammation of the paranasal sinuses.  Major vessels at the base of the brain show flow.  IMPRESSION: Background pattern of chronic small vessel disease throughout the brain.  5 mm acute infarction within the lateral thalamus on the right. Suspicion of small acute or subacute infarction within the medulla.  Thomasenia Sales, M.D.   Assessment: 66 y.o. female with acute right thalamic infarct. Has resolving sensory alteration of lower left face, left hand and mild weakness of left U/L extremity.  Stroke work  up underway.  Strong maternal family history of CVA/CAD.    LDL 107, Goal LDL< 100 Stroke Risk Factors - HTN. HLD  Plan: 1. HgbA1c,- pending 2. MRA of the brain without contrast 3. PT consult, OT consult 4. Echocardiogram 5. Carotid dopplers 6. Prophylactic therapy-ASA 325 mg daily 7. Risk factor modification 8. Telemetry monitoring 9. Agree with adding Zocor; alk phos elevated, monitor on statin therapy.   Marya Fossa PA-C [facility] [phone] 06/29/2012, 11:11 AM  Patient seen and examined.  Clinical course and management discussed.  Necessary edits performed.  I agree with the above.  Thana Farr, MD [facility] [phone]  06/29/2012  1:31 PM

## 2012-06-30 DIAGNOSIS — I6350 Cerebral infarction due to unspecified occlusion or stenosis of unspecified cerebral artery: Secondary | ICD-10-CM

## 2012-06-30 DIAGNOSIS — E785 Hyperlipidemia, unspecified: Secondary | ICD-10-CM | POA: Diagnosis present

## 2012-06-30 LAB — URINE CULTURE

## 2012-06-30 MED ORDER — ASPIRIN 81 MG PO CHEW
CHEWABLE_TABLET | ORAL | Status: AC
  Administered 2012-06-30: 81 mg
  Filled 2012-06-30: qty 1

## 2012-06-30 MED ORDER — ASPIRIN EC 81 MG PO TBEC
81.0000 mg | DELAYED_RELEASE_TABLET | Freq: Every day | ORAL | Status: DC
  Administered 2012-07-01: 81 mg via ORAL
  Filled 2012-06-30 (×3): qty 1

## 2012-06-30 NOTE — Progress Notes (Signed)
*  PRELIMINARY RESULTS* Vascular Ultrasound Carotid Duplex (Doppler) has been completed.  Preliminary findings: Bilaterally no significant ICA stenosis with antegrade vertebral flow.  Farrel Demark, RDMS, RVT  06/30/2012, 2:41 PM

## 2012-06-30 NOTE — Progress Notes (Signed)
PCP: Cassell Smiles., MD  Brief HPI:  A 66 year old female transferred from [facility] who presented with sudden onset of left facial numbness and left hand numbness but no weakness. She has risk factors for stroke including hypertension and hypothyroidism. She has never had a stroke before and no strong family history of stroke. She was seen in the emergency room at [facility] where a MRI was done showing a right medullary stroke. She was therefore transferred to [facility] for further evaluation. Patient had symptoms that started 3 days prior to admission with intermittent numbness in the affected areas. She did not take it serious because it was going and coming back. She thought it was her neck discs that were acting out and she was be having some neuropathy. However the symptoms persisted. No other side is affected and she is able to use her arm without a problem. No visual changes no change in her speech.  Past medical history:  Past Medical History  Diagnosis Date  . Hypothyroidism   . Anxiety   . Depression   . Osteoporosis   . Sleep apnea   . HTN (hypertension)     resolved with weight loss s/p gastric bypass     Consultants: Neurology  Procedures: None  Subjective: Patient states that she is about the same. Numbness in the left arm and face persists. Says that she does have some discomfort in her left hip when she tries to lift her leg. Denies any other symptoms.  Objective: Vital signs in last 24 hours: Temp:  [97.9 F (36.6 C)-98.4 F (36.9 C)] 98.1 F (36.7 C) (08/04 0500) Pulse Rate:  [65-79] 65  (08/04 0500) Resp:  [18] 18  (08/04 0500) BP: (147-153)/(73-89) 147/76 mmHg (08/04 0500) SpO2:  [97 %-100 %] 98 % (08/04 0500) Weight change:  Last BM Date: 06/28/12  Intake/Output from previous day:   Intake/Output this shift:    General appearance: alert, cooperative, appears stated age and no distress Head: Normocephalic, without obvious abnormality,  atraumatic Resp: clear to auscultation bilaterally Cardio: regular rate and rhythm, S1, S2 normal, no murmur, click, rub or gallop GI: soft, non-tender; bowel sounds normal; no masses,  no organomegaly Extremities: extremities normal, atraumatic, no cyanosis or edema Neurologic: Alert and oriented X 3. Decreased sensation left face. Other cranial nerves normal. Normal strength and tone in upper ext. Left leg is weaker (4-5/5) compared to right probably due to left hip issues rather than neurological. Bicep reflexes sluggish on left. Left patellar was sluggish as well. Normal coordination. No pronator drift.  Lab Results:  Basename 06/28/12 2350 06/28/12 1510  WBC 7.3 7.0  HGB 12.8 12.6  HCT 39.5 39.2  PLT 271 301   BMET  Basename 06/28/12 2350 06/28/12 1510  NA -- 137  K -- 4.2  CL -- 101  CO2 -- 27  GLUCOSE -- 87  BUN -- 6  CREATININE 0.85 0.77  CALCIUM -- 9.5  ALT -- 21    Studies/Results: Dg Chest 2 View  06/29/2012  *RADIOLOGY REPORT*  Clinical Data: CVA.  CHEST - 2 VIEW  Comparison: 12/07/2004 chest radiograph  Findings: The cardiomediastinal silhouette is unremarkable. There is no evidence of focal airspace disease, pulmonary edema, suspicious pulmonary nodule/mass, pleural effusion, or pneumothorax. No acute bony abnormalities are identified.  IMPRESSION: No evidence of active cardiopulmonary disease.  Original Report Authenticated By: Rosendo Gros, M.D.   Ct Head Wo Contrast  06/28/2012  *RADIOLOGY REPORT*  Clinical Data: Numbness.  Left facial and arm numbness which began yesterday.  CT HEAD WITHOUT CONTRAST  Technique:  Contiguous axial images were obtained from the base of the skull through the vertex without contrast.  Comparison: None  Findings: There is patchy low density within the subcortical and periventricular white matter consistent with chronic small vessel ischemic change.  The brain has an otherwise normal appearance without evidence for hemorrhage, infarction,  hydrocephalus, or mass lesion.  There is no extra axial fluid collection.  The skull and paranasal sinuses are normal.  There is mucosal thickening involving the ethmoid air cells.  The remaining paranasal sinuses are clear.  The mastoid air cells are clear.  The skull is intact.  IMPRESSION:  1.  No acute intracranial abnormalities. 2. Small vessel ischemic disease  Original Report Authenticated By: Rosealee Albee, M.D.   Mr [facility] Head Wo Contrast  06/29/2012  *RADIOLOGY REPORT*  Clinical Data: Right thalamic infarct.  Numbness about her lips.  MRA HEAD WITHOUT CONTRAST  Technique: Angiographic images of the Circle of Willis were obtained using MRA technique without intravenous contrast.  Comparison: MRI brain 06/28/2012.  Findings: The study is mildly degraded by patient motion in the upper slab.  Mild irregularity is present the cavernous and supraclinoid internal carotid arteries without significant stenosis.  The A1 and M1 segments are normal.  The anterior communicating artery is patent.  The right A2 segment is small.  The A2 segment is essentially azygos from the left.  The MCA bifurcations are within normal limits.  There is moderate segmental irregularity within the MCA branch vessels bilaterally.  The vertebral arteries are codominant.  The left PICA origin is visualized and normal.  The AICA vessels are prominent bilaterally. The basilar artery is normal.  Both posterior cerebral arteries originate from the basilar tip.  There is signal loss in the proximal left posterior cerebral artery compatible with a moderate stenosis.  There is asymmetric to the distal PCA branch vessels bilaterally, worse on the left.  IMPRESSION:  1.  Atherosclerotic changes within the cavernous carotid arteries bilaterally. 2.  Mild to moderate small vessel disease. 3.  Proximal narrowing in the left posterior cerebral artery suggests a moderate stenosis.  Original Report Authenticated By: Jamesetta Orleans. MATTERN, M.D.   Mr  Brain Wo Contrast  06/28/2012  *RADIOLOGY REPORT*  Clinical Data: Numbness of the left side of the body.  MRI HEAD WITHOUT CONTRAST  Technique:  Multiplanar, multiecho pulse sequences of the brain and surrounding structures were obtained according to standard protocol without intravenous contrast.  Comparison: Head CT same day  Findings:  there is a 5 mm acute infarction in the lateral thalamus on the right.  I think there may be a small subacute infarction in the central medulla as well.  No other acute infarction.  There are extensive chronic small vessel changes within the pons.  There are old small vessel infarctions within the cerebellum.  There are old chronic small vessel infarctions affecting the thalami, basal ganglia and the cerebral hemispheric deep white matter.  No large vessel territory infarction.  No mass lesion, hemorrhage, hydrocephalus or extra-axial collection.  No pituitary mass.  There is mild mucosal inflammation of the paranasal sinuses.  Major vessels at the base of the brain show flow.  IMPRESSION: Background pattern of chronic small vessel disease throughout the brain.  5 mm acute infarction within the lateral thalamus on the right. Suspicion of small acute or subacute infarction within the medulla.  Original Report Authenticated By:  Thomasenia Sales, M.D.    Medications:  Scheduled:    . aspirin EC  81 mg Oral Daily  . enoxaparin  30 mg Subcutaneous Q12H  . ferrous sulfate  325 mg Oral BH-q7a  . FLUoxetine  40 mg Oral BH-q7a  . levothyroxine  112 mcg Oral BH-q7a  . LORazepam  1 mg Oral Once  . multivitamin with minerals  1 tablet Oral q morning - 10a  . simvastatin  40 mg Oral q1800  . DISCONTD: aspirin  325 mg Oral Daily  . DISCONTD: dipyridamole-aspirin  1 capsule Oral Daily   Continuous:  GEX:[id number], ondansetron (ZOFRAN) IV, senna-docusate  Assessment/Plan:  Principal Problem:  *Stroke Active Problems:  Hypothyroidism  Anxiety  Elevated blood pressure  reading without diagnosis of hypertension    Acute CVA involving Right Thalamus and Medulla Patient is stable and has some deficits on the left. Stroke work up in progress. Patient does not take aspirin or any other antiplatelets at home. Now on ASA. Statin has been initiated. PT/OT has seen. Appreciate neurology input. EKG showed SR. MRA report reviewed. Await ECHo and Carotid dopplers.  Elevated BP She states she used to have HTN but that resolved once she had gastric bypass procedure. But her BP has been trending upwards over the last many months. Continue to monitor. Do not treat unless significantly elevated. This can be further addressed by her PCP.  H/O Hypothyroidism Continue Levothyroxine  Anxiety Disorder Xanax as needed.  H/O Sleep Apnea Not since gastric bypass procedure.  Abnormal UA Asymptomatic. Afebrile. Possibly contaminated. No antibiotics for now.  Code Status Full Code  DVT Prophylaxis Enoxaparin  Disposition: Will go home once work up is completed.   LOS: 2 days   [facility]  [facility] Pager [phone] 06/30/2012, 9:20 AM

## 2012-06-30 NOTE — Progress Notes (Signed)
Admission History: Virginia [name] is an 66 y.o. female who began feeling a tingling sensation of left side mouth, left tip of tongue and left hand digits Wednesday evening, 06/26/12. Thursday, experienced similar sensations that waxed and waned throughout the day. Woke Friday am with persistent symptoms. Decided to go to ER.  MRI brain confirmed infarct right lateral thalamus and suspicion of small acute or subacute infarction within the medulla.  She was admitted for further workup.  Subjective: I'm about the same.  A little less tingling in middle 3 fingers left hand. Left perioral about the same.    Objective: BP 147/76  Pulse 65  Temp 98.1 F (36.7 C) (Oral)  Resp 18  Ht 5\' 4"  (1.626 m)  Wt 84.369 kg (186 lb)  BMI 31.93 kg/m2  SpO2 98% Telemetry: SR  Diet: regular, thin Activity: up ad lib DVT Prophylaxis: lovenox  Medications: Scheduled:   . sodium chloride   Intravenous STAT  . aspirin  325 mg Oral Daily  . enoxaparin  30 mg Subcutaneous Q12H  . ferrous sulfate  325 mg Oral BH-q7a  . FLUoxetine  40 mg Oral BH-q7a  . levothyroxine  112 mcg Oral BH-q7a  . LORazepam  1 mg Oral Once  . multivitamin with minerals  1 tablet Oral q morning - 10a  . simvastatin  40 mg Oral q1800  . DISCONTD: dipyridamole-aspirin  1 capsule Oral Daily   Neurologic Examination:  Mental Status:  Alert, oriented, thought content appropriate. Speech fluent without evidence of aphasia. Able to follow 3 step commands without difficulty.  Cranial Nerves:  II- Visual fields grossly intact.  III/IV/VI-Extraocular movements intact. Pupils reactive bilaterally.  V/VII-Smile symmetric  VIII-grossly intact  IX/X-not assessed  XI-bilateral shoulder shrug  XII-midline tongue extension  Motor: 5/5 RUE/ RLE. -5/5 proximal LUE, slightly weaker grip on left. -5/5 hip flexor and leg elevation LLE, normal tone and bulk. No drift.  Sensory: light touch diminished left lips to beginning of cheek to just under  chin, left thumb and pinkie and left plantar aspect foot.  Deep Tendon Reflexes: 2+ and symmetric throughout  Plantars: Downgoing bilaterally  Cerebellar: Normal finger-to-nose and normal heel-to-shin test. Less orbiting of right hand around left.  Physical Exam: Carotids without bruit Cor RRR without M/G/R Lungs CTA Extremities without edema  Lab Results: Basic Metabolic Panel:  Lab 06/28/12 1610 06/28/12 1510  NA -- 137  K -- 4.2  CL -- 101  CO2 -- 27  GLUCOSE -- 87  BUN -- 6  CREATININE 0.85 0.77  CALCIUM -- 9.5  MG -- --  PHOS -- --   Liver Function Tests:  Lab 06/28/12 1510  AST 26  ALT 21  ALKPHOS 129*  BILITOT 0.3  PROT 7.2  ALBUMIN 3.4*   CBC:  Lab 06/28/12 2350 06/28/12 1510  WBC 7.3 7.0  NEUTROABS -- 3.5  HGB 12.8 12.6  HCT 39.5 39.2  MCV 89.2 89.5  PLT 271 301   Hemoglobin A1C:  Lab 06/29/12 0700  HGBA1C 5.5   Fasting Lipid Panel:  Lab 06/29/12 0500  CHOL 183  HDL 59  LDLCALC 107*  TRIG 87  CHOLHDL 3.1  LDLDIRECT --   Coagulation:  Lab 06/28/12 1510  LABPROT 13.0  INR 0.96   Urinalysis:  Lab 06/28/12 1459  COLORURINE YELLOW  LABSPEC <1.005*  PHURINE 5.5  GLUCOSEU NEGATIVE  HGBUR NEGATIVE  BILIRUBINUR NEGATIVE  KETONESUR NEGATIVE  PROTEINUR NEGATIVE  UROBILINOGEN 0.2  NITRITE NEGATIVE  LEUKOCYTESUR MODERATE*  Study Results:  06/29/2012  CHEST - 2 VIEW  Comparison: 12/07/2004 chest radiograph  Findings: The cardiomediastinal silhouette is unremarkable. There is no evidence of focal airspace disease, pulmonary edema, suspicious pulmonary nodule/mass, pleural effusion, or pneumothorax. No acute bony abnormalities are identified.  IMPRESSION: No evidence of active cardiopulmonary disease.JEFFREY T. HU, M.D.   06/29/2012  MRA HEAD WITHOUT CONTRAST  Findings: The study is mildly degraded by patient motion in the upper slab.  Mild irregularity is present the cavernous and supraclinoid internal carotid arteries without significant  stenosis.  The A1 and M1 segments are normal.  The anterior communicating artery is patent.  The right A2 segment is small.  The A2 segment is essentially azygos from the left.  The MCA bifurcations are within normal limits.  There is moderate segmental irregularity within the MCA branch vessels bilaterally.  The vertebral arteries are codominant.  The left PICA origin is visualized and normal.  The AICA vessels are prominent bilaterally. The basilar artery is normal.  Both posterior cerebral arteries originate from the basilar tip.  There is signal loss in the proximal left posterior cerebral artery compatible with a moderate stenosis.  There is asymmetric to the distal PCA branch vessels bilaterally, worse on the left.  IMPRESSION:  1.  Atherosclerotic changes within the cavernous carotid arteries bilaterally. 2.  Mild to moderate small vessel disease. 3.  Proximal narrowing in the left posterior cerebral artery suggests a moderate stenosis.  CHRISTOPHER W. MATTERN, M.D.   06/28/2012  MRI HEAD WITHOUT CONTRAST  Findings:  there is a 5 mm acute infarction in the lateral thalamus on the right.  I think there may be a small subacute infarction in the central medulla as well.  No other acute infarction.  There are extensive chronic small vessel changes within the pons.  There are old small vessel infarctions within the cerebellum.  There are old chronic small vessel infarctions affecting the thalami, basal ganglia and the cerebral hemispheric deep white matter.  No large vessel territory infarction.  No mass lesion, hemorrhage, hydrocephalus or extra-axial collection.  No pituitary mass.  There is mild mucosal inflammation of the paranasal sinuses.  Major vessels at the base of the brain show flow.  IMPRESSION: Background pattern of chronic small vessel disease throughout the brain.  5 mm acute infarction within the lateral thalamus on the right. Suspicion of small acute or subacute infarction within the medulla.   Thomasenia Sales, M.D.   06/28/2012 CT HEAD WITHOUT CONTRAST Findings: There is patchy low density within the subcortical and periventricular white matter consistent with chronic small vessel ischemic change.  The brain has an otherwise normal appearance without evidence for hemorrhage, infarction, hydrocephalus, or mass lesion.  There is no extra axial fluid collection.  The skull and paranasal sinuses are normal.  There is mucosal thickening involving the ethmoid air cells.  The remaining paranasal sinuses are clear.  The mastoid air cells are clear.  The skull is intact.  IMPRESSION:  1.  No acute intracranial abnormalities. 2. Small vessel ischemic disease. Rosealee Albee, M.D.   Therapies: no therapy needed  Assessment/Plan: 66 y.o. female with acute right thalamic infarct and small acute or subacute infarction within the medulla.  Has resolving sensory alteration of lower left face, left hand and mild weakness of left U/L extremity. Treatment initiated for hyperlipidemia and ASA added for secondary prevention.   The patient likely has had a small vessel ischemic stroke. Doubt cardioembolic event  A1C 5.5  Plan:   1. Echocardiogram  2. Carotid dopplers  3. Prophylactic therapy-ASA 81 mg daily The patient was not on aspirin PTA 4. Recommend outpatient sleep study Will likely be able to go home after echo and carotid doppler testing.  Will need 2 month f/u with Dr. Anne Hahn. Blood pressures are slightly elevated- the patient may require therapy.   LOS: 2 days   Marya Fossa PA-C [facility] [phone] 06/30/2012  8:18 AM  Lesly Dukes

## 2012-07-01 MED ORDER — SIMVASTATIN 40 MG PO TABS: 20.0000 mg | ORAL_TABLET | Freq: Every day | ORAL | Status: DC

## 2012-07-01 MED ORDER — ASPIRIN 81 MG PO TBEC: 81.0000 mg | DELAYED_RELEASE_TABLET | Freq: Every day | ORAL | Status: AC

## 2012-07-01 NOTE — Progress Notes (Signed)
  ECHO report still not available. I called down to the CV Lab and they were waiting on a read by the cardiologist. I have conveyed the above to the patient. It is possible she may need to stay overnight if ECHO report is not available by 7Pm.  Virginia [name] 5:38 PM

## 2012-07-01 NOTE — Progress Notes (Signed)
  Echocardiogram 2D Echocardiogram has been performed.  Georgian Co 07/01/2012, 9:52 AM

## 2012-07-01 NOTE — Discharge Summary (Addendum)
Physician Discharge Summary  Patient ID: Virginia [name] MRN: [id number] DOB/AGE: 1946/01/02 66 y.o.  Admit date: 06/28/2012 Discharge date: 07/01/2012  PCP: Cassell Smiles., MD  DISCHARGE DIAGNOSES:  Principal Problem:  *Stroke Active Problems:  Hypothyroidism  Anxiety  Elevated blood pressure reading without diagnosis of hypertension  Hyperlipidemia   RECOMMENDATIONS TO PCP: 1. Please follow up for high BP. She may need initiation of an anti-hypertensive medication  DISCHARGE CONDITION: fair  INITIAL HISTORY: A 66 year old female transferred from [facility] who presented with sudden onset of left facial numbness and left hand numbness but no weakness. She has risk factors for stroke including hypertension and hypothyroidism. She has never had a stroke before and no strong family history of stroke. She was seen in the emergency room at [facility] where a MRI was done showing a right medullary stroke. She was therefore transferred to [facility] for further evaluation. Patient had symptoms that started 3 days prior to admission with intermittent numbness in the affected areas. She did not take it serious because it was going and coming back. She thought it was her neck discs that were acting out and she was be having some neuropathy. However the symptoms persisted. No other side is affected and she is able to use her arm without a problem. No visual changes no change in her speech.   HOSPITAL COURSE:   Acute CVA involving Right Thalamus and Medulla  Patient remained stable. She still has some numbness in the face, as well as in the left hand. She was started on aspirin. Her LDL was elevated and so, she was started on simvastatin. She will need to see the neurologist in followup in 2 months. She was seen by physical and occupational therapy and was cleared for discharge. Does not have any home health needs. EKG did not show any atrial fibrillation. Carotid Dopplers did not show any  significant stenosis. Echocardiogram is pending and once that is, reported she'll be discharged home.   Elevated BP  She states she used to have HTN but that resolved once she had gastric bypass procedure. But her BP has been trending upwards over the last many months. She did have elevated blood pressures during this hospitalization. However, because of the stroke she was not aggressively treated. Have asked her to followup with her PCP for blood pressure check next week and to consider initiation of antihypertensive agent at that time.   H/O Hypothyroidism  Continue Levothyroxine   Anxiety Disorder  Xanax as needed.   H/O Sleep Apnea  Not since gastric bypass procedure.   Abnormal UA  Asymptomatic. Afebrile. Possibly contaminated. No antibiotics for now.   Overall, patient has remained stable. She stable for discharge as long as the echocardiogram looks out. Right. Once that report is, available. She'll be discharged home.   PERTINENT LABS: LDL was 107. HbA1c was 5.5.   IMAGING STUDIES Dg Chest 2 View  06/29/2012  *RADIOLOGY REPORT*  Clinical Data: CVA.  CHEST - 2 VIEW  Comparison: 12/07/2004 chest radiograph  Findings: The cardiomediastinal silhouette is unremarkable. There is no evidence of focal airspace disease, pulmonary edema, suspicious pulmonary nodule/mass, pleural effusion, or pneumothorax. No acute bony abnormalities are identified.  IMPRESSION: No evidence of active cardiopulmonary disease.  Original Report Authenticated By: Rosendo Gros, M.D.   Ct Head Wo Contrast  06/28/2012  *RADIOLOGY REPORT*  Clinical Data: Numbness.  Left facial and arm numbness which began yesterday.  CT HEAD WITHOUT CONTRAST  Technique:  Contiguous axial images were obtained from the base of the skull through the vertex without contrast.  Comparison: None  Findings: There is patchy low density within the subcortical and periventricular white matter consistent with chronic small vessel ischemic  change.  The brain has an otherwise normal appearance without evidence for hemorrhage, infarction, hydrocephalus, or mass lesion.  There is no extra axial fluid collection.  The skull and paranasal sinuses are normal.  There is mucosal thickening involving the ethmoid air cells.  The remaining paranasal sinuses are clear.  The mastoid air cells are clear.  The skull is intact.  IMPRESSION:  1.  No acute intracranial abnormalities. 2. Small vessel ischemic disease  Original Report Authenticated By: Rosealee Albee, M.D.   Mr [facility] Head Wo Contrast  06/29/2012  *RADIOLOGY REPORT*  Clinical Data: Right thalamic infarct.  Numbness about her lips.  MRA HEAD WITHOUT CONTRAST  Technique: Angiographic images of the Circle of Willis were obtained using MRA technique without intravenous contrast.  Comparison: MRI brain 06/28/2012.  Findings: The study is mildly degraded by patient motion in the upper slab.  Mild irregularity is present the cavernous and supraclinoid internal carotid arteries without significant stenosis.  The A1 and M1 segments are normal.  The anterior communicating artery is patent.  The right A2 segment is small.  The A2 segment is essentially azygos from the left.  The MCA bifurcations are within normal limits.  There is moderate segmental irregularity within the MCA branch vessels bilaterally.  The vertebral arteries are codominant.  The left PICA origin is visualized and normal.  The AICA vessels are prominent bilaterally. The basilar artery is normal.  Both posterior cerebral arteries originate from the basilar tip.  There is signal loss in the proximal left posterior cerebral artery compatible with a moderate stenosis.  There is asymmetric to the distal PCA branch vessels bilaterally, worse on the left.  IMPRESSION:  1.  Atherosclerotic changes within the cavernous carotid arteries bilaterally. 2.  Mild to moderate small vessel disease. 3.  Proximal narrowing in the left posterior cerebral artery  suggests a moderate stenosis.  Original Report Authenticated By: Jamesetta Orleans. MATTERN, M.D.   Mr Brain Wo Contrast  06/28/2012  *RADIOLOGY REPORT*  Clinical Data: Numbness of the left side of the body.  MRI HEAD WITHOUT CONTRAST  Technique:  Multiplanar, multiecho pulse sequences of the brain and surrounding structures were obtained according to standard protocol without intravenous contrast.  Comparison: Head CT same day  Findings:  there is a 5 mm acute infarction in the lateral thalamus on the right.  I think there may be a small subacute infarction in the central medulla as well.  No other acute infarction.  There are extensive chronic small vessel changes within the pons.  There are old small vessel infarctions within the cerebellum.  There are old chronic small vessel infarctions affecting the thalami, basal ganglia and the cerebral hemispheric deep white matter.  No large vessel territory infarction.  No mass lesion, hemorrhage, hydrocephalus or extra-axial collection.  No pituitary mass.  There is mild mucosal inflammation of the paranasal sinuses.  Major vessels at the base of the brain show flow.  IMPRESSION: Background pattern of chronic small vessel disease throughout the brain.  5 mm acute infarction within the lateral thalamus on the right. Suspicion of small acute or subacute infarction within the medulla.  Original Report Authenticated By: Thomasenia Sales, M.D.    DISCHARGE EXAMINATION: Blood pressure 144/73, pulse 67, temperature 97.9  F (36.6 C), temperature source Oral, resp. rate 18, height 5\' 4"  (1.626 m), weight 84.369 kg (186 lb), SpO2 98.00%. General appearance: alert, cooperative, appears stated age and no distress Resp: clear to auscultation bilaterally Cardio: regular rate and rhythm, S1, S2 normal, no murmur, click, rub or gallop GI: soft, non-tender; bowel sounds normal; no masses,  no organomegaly Neurologic: Alert and oriented x3. No focal motor neurological  deficits  DISPOSITION: Home   Current Discharge Medication List    START taking these medications   Details  aspirin EC 81 MG EC tablet Take 1 tablet (81 mg total) by mouth daily. Qty: 30 tablet, Refills: 3    simvastatin (ZOCOR) 40 MG tablet Take 0.5 tablets (20 mg total) by mouth daily at 6 PM. Qty: 30 tablet, Refills: 3      CONTINUE these medications which have NOT CHANGED   Details  ALPRAZolam (XANAX) 0.5 MG tablet Take 0.5 mg by mouth daily as needed. For anxiety and/or sleep    Calcium-Vitamin D (CALTRATE 600 PLUS-VIT D PO) Take 1 tablet by mouth every morning.    Cholecalciferol (VITAMIN D PO) Take 1 tablet by mouth every morning.    Cyanocobalamin (VITAMIN B-12 PO) Take 1 tablet by mouth every morning.    ferrous sulfate 325 (65 FE) MG tablet Take 325 mg by mouth every morning.    FLUoxetine (PROZAC) 40 MG capsule Take 40 mg by mouth every morning.     levothyroxine (SYNTHROID, LEVOTHROID) 112 MCG tablet Take 112 mcg by mouth every morning.    Multiple Vitamin (MULTIVITAMIN WITH MINERALS) TABS Take 1 tablet by mouth every morning.       Follow-up Information    Follow up with Lesly Dukes, MD. Schedule an appointment as soon as possible for a visit in 2 months. (stroke follow up and recommend sleep study)    Contact information:   [address], [address] [place] [address] [facility] [place] [address] [phone]       Follow up with Cassell Smiles., MD. Schedule an appointment as soon as possible for a visit in 1 week. (BP check and to consider initiating antihypertensive medication)    Contact information:   [address] [place] [address] [phone]          TOTAL DISCHARGE TIME: 35 mins  [facility]  [facility] Pager [phone]  07/01/2012, 12:00 PM  ECHO report available this morning. EF was normal. Per neurology this is unlikely to be embolic stroke. Follow up as  outlined above.  Montasia Chisenhall 07/02/2012 7:26 AM

## 2012-07-01 NOTE — Progress Notes (Signed)
Patient was given stroke d.c instructions along with beyond the hospital booklet. Pt and I had a lengthy discussion about s/s and what meds to take.  No questions were asked.  Pt is pending d/c per ECHO results, therefore stroke discharge ed was done just incase.  Patient stable, neuro intact.

## 2012-07-01 NOTE — Progress Notes (Signed)
Admission History: Virginia [name] is an 66 y.o. female who began feeling a tingling sensation of left side mouth, left tip of tongue and left hand digits Wednesday evening, 06/26/12. Thursday, experienced similar sensations that waxed and waned throughout the day. Woke Friday am with persistent symptoms. Decided to go to ER.  MRI brain confirmed infarct right lateral thalamus and suspicion of small acute or subacute infarction within the medulla.  She was admitted for further workup.  Subjective: I feel pretty good. Still with some left sided numbness.  Objective: BP 164/78  Pulse 62  Temp 98 F (36.7 C) (Oral)  Resp 20  Ht 5\' 4"  (1.626 m)  Wt 84.369 kg (186 lb)  BMI 31.93 kg/m2  SpO2 98% Telemetry: SR  Diet: regular, thin Activity: up ad lib DVT Prophylaxis: lovenox  Medications: Scheduled:   . aspirin EC  81 mg Oral Daily  . enoxaparin  30 mg Subcutaneous Q12H  . ferrous sulfate  325 mg Oral BH-q7a  . FLUoxetine  40 mg Oral BH-q7a  . levothyroxine  112 mcg Oral BH-q7a  . multivitamin with minerals  1 tablet Oral q morning - 10a  . simvastatin  40 mg Oral q1800   Neurologic Examination:  GENERAL EXAM: Patient is in no distress  CARDIOVASCULAR: Regular rate and rhythm, no murmurs, no carotid bruits  NEUROLOGIC: MENTAL STATUS: awake, alert, language fluent, comprehension intact, naming intact CRANIAL NERVE: pupils equal and reactive to light, visual fields full to confrontation, extraocular muscles intact, no nystagmus, DECREASED LEFT NASAL AND LEFT UPPER LIP SENSATION, facial strength symmetric, uvula midline, shoulder shrug symmetric, tongue midline. MOTOR: normal bulk and tone, full strength in the BUE, BLE; EXCEPT MILD LEFT HIP FLEXOR WEAKNESS (5-/5).  SENSORY: DECREASED SENSATION IN LEFT LEFT UPPER LIPS, LEFT HAND. COORDINATION: finger-nose-finger, fine finger movements, heel-shin normal REFLEXES: deep tendon reflexes present and symmetric GAIT/STATION: IN BED. NOT  ASSESSED.   Lab Results: Basic Metabolic Panel: Lab 06/28/12 2350 06/28/12 1510  NA -- 137  K -- 4.2  CL -- 101  CO2 -- 27  GLUCOSE -- 87  BUN -- 6  CREATININE 0.85 0.77  CALCIUM -- 9.5  MG -- --  PHOS -- --   Liver Function Tests: Lab 06/28/12 1510  AST 26  ALT 21  ALKPHOS 129*  BILITOT 0.3  PROT 7.2  ALBUMIN 3.4*   CBC: Lab 06/28/12 2350 06/28/12 1510  WBC 7.3 7.0  NEUTROABS -- 3.5  HGB 12.8 12.6  HCT 39.5 39.2  MCV 89.2 89.5  PLT 271 301   Hemoglobin A1C: Lab 06/29/12 0700  HGBA1C 5.5   Fasting Lipid Panel: Lab 06/29/12 0500  CHOL 183  HDL 59  LDLCALC 107*  TRIG 87  CHOLHDL 3.1  LDLDIRECT --   Coagulation: Lab 06/28/12 1510  LABPROT 13.0  INR 0.96   Urinalysis: Lab 06/28/12 1459  COLORURINE YELLOW  LABSPEC <1.005*  PHURINE 5.5  GLUCOSEU NEGATIVE  HGBUR NEGATIVE  BILIRUBINUR NEGATIVE  KETONESUR NEGATIVE  PROTEINUR NEGATIVE  UROBILINOGEN 0.2  NITRITE NEGATIVE  LEUKOCYTESUR MODERATE*   Study Results:  06/29/2012  CHEST - 2 VIEW  Comparison: 12/07/2004 chest radiograph  Findings: The cardiomediastinal silhouette is unremarkable. There is no evidence of focal airspace disease, pulmonary edema, suspicious pulmonary nodule/mass, pleural effusion, or pneumothorax. No acute bony abnormalities are identified.  IMPRESSION: No evidence of active cardiopulmonary disease.JEFFREY T. HU, M.D.   06/29/2012  MRA HEAD WITHOUT CONTRAST  Findings: The study is mildly degraded by patient motion in  the upper slab.  Mild irregularity is present the cavernous and supraclinoid internal carotid arteries without significant stenosis.  The A1 and M1 segments are normal.  The anterior communicating artery is patent.  The right A2 segment is small.  The A2 segment is essentially azygos from the left.  The MCA bifurcations are within normal limits.  There is moderate segmental irregularity within the MCA branch vessels bilaterally.  The vertebral arteries are codominant.  The  left PICA origin is visualized and normal.  The AICA vessels are prominent bilaterally. The basilar artery is normal.  Both posterior cerebral arteries originate from the basilar tip.  There is signal loss in the proximal left posterior cerebral artery compatible with a moderate stenosis.  There is asymmetric to the distal PCA branch vessels bilaterally, worse on the left.  IMPRESSION:  1.  Atherosclerotic changes within the cavernous carotid arteries bilaterally. 2.  Mild to moderate small vessel disease. 3.  Proximal narrowing in the left posterior cerebral artery suggests a moderate stenosis.  CHRISTOPHER W. MATTERN, M.D.   06/28/2012  MRI HEAD WITHOUT CONTRAST  Findings:  there is a 5 mm acute infarction in the lateral thalamus on the right.  I think there may be a small subacute infarction in the central medulla as well.  No other acute infarction.  There are extensive chronic small vessel changes within the pons.  There are old small vessel infarctions within the cerebellum.  There are old chronic small vessel infarctions affecting the thalami, basal ganglia and the cerebral hemispheric deep white matter.  No large vessel territory infarction.  No mass lesion, hemorrhage, hydrocephalus or extra-axial collection.  No pituitary mass.  There is mild mucosal inflammation of the paranasal sinuses.  Major vessels at the base of the brain show flow.  IMPRESSION: Background pattern of chronic small vessel disease throughout the brain.  5 mm acute infarction within the lateral thalamus on the right. Suspicion of small acute or subacute infarction within the medulla.  Thomasenia Sales, M.D.   06/28/2012 CT HEAD WITHOUT CONTRAST Findings: There is patchy low density within the subcortical and periventricular white matter consistent with chronic small vessel ischemic change.  The brain has an otherwise normal appearance without evidence for hemorrhage, infarction, hydrocephalus, or mass lesion.  There is no extra axial  fluid collection.  The skull and paranasal sinuses are normal.  There is mucosal thickening involving the ethmoid air cells.  The remaining paranasal sinuses are clear.  The mastoid air cells are clear.  The skull is intact.  IMPRESSION:  1.  No acute intracranial abnormalities. 2. Small vessel ischemic disease. Rosealee Albee, M.D.   06/30/2012 Carotid Doppler No internal carotid artery stenosis bilaterally. Vertebrals with antegrade flow bilaterally.   06/30/2012 2D Echo   Therapies: no therapy needed  Assessment/Plan: 66 y.o. female with acute right thalamic infarct and small acute or subacute infarction within the medulla.  Has resolving sensory alteration of lower left face, left hand and mild weakness of left U/L extremity. Treatment initiated for hyperlipidemia and ASA added for secondary prevention.   Stroke secondary to small vessel ischemic stroke. Doubt cardioembolic event  A1C 5.5  Obesity, Body mass index is 31.93 kg/(m^2).  Plan:   1. F/u echocardiogram results 2. ASA 81 mg daily The patient was not on aspirin PTA 3. Recommend outpatient sleep study  Ok for discharge from neuro standpoint. Will need 2 month f/u with Dr. Anne Hahn. Blood pressures are slightly elevated- the patient may require  therapy.   LOS: 3 days   Annie Main, MSN, RN, ANVP-BC, ANP-BC, GNP-BC [facility] Pager: [phone] 07/01/2012 9:57 AM  Dr. Joycelyn Schmid has personally reviewed chart, pertinent data, examined the patient and developed the plan of care.  [facility] - Stroke Team Joycelyn Schmid, MD 07/01/2012, 7:24 PM   Please refer to [email or web address] for on-call Stroke MD

## 2012-07-02 NOTE — Progress Notes (Signed)
Pt D/C'd home via wheelchair @ 0830 per MD order. Pt given D/C instructions with Rx's, Pt verbalized understanding of stroke education. Rema Fendt, RN

## 2012-07-02 NOTE — Progress Notes (Signed)
PCP: Cassell Smiles., MD  Brief HPI:  A 67 year old female transferred from [facility] who presented with sudden onset of left facial numbness and left hand numbness but no weakness. She has risk factors for stroke including hypertension and hypothyroidism. She has never had a stroke before and no strong family history of stroke. She was seen in the emergency room at [facility] where a MRI was done showing a right medullary stroke. She was therefore transferred to [facility] for further evaluation. Patient had symptoms that started 3 days prior to admission with intermittent numbness in the affected areas. She did not take it serious because it was going and coming back. She thought it was her neck discs that were acting out and she was be having some neuropathy. However the symptoms persisted. No other side is affected and she is able to use her arm without a problem. No visual changes no change in her speech.  Past medical history:  Past Medical History  Diagnosis Date  . Hypothyroidism   . Anxiety   . Depression   . Osteoporosis   . Sleep apnea   . HTN (hypertension)     resolved with weight loss s/p gastric bypass     Consultants: Neurology  Procedures: None  Subjective: Patient remains stable. No new complaints. Numbness persists.  Objective: Vital signs in last 24 hours: Temp:  [97.5 F (36.4 C)-98 F (36.7 C)] 98 F (36.7 C) (08/06 0627) Pulse Rate:  [66-75] 66  (08/06 0627) Resp:  [17-18] 17  (08/06 0627) BP: (131-147)/(68-76) 144/68 mmHg (08/06 0627) SpO2:  [98 %-100 %] 100 % (08/06 0627) Weight change:  Last BM Date: 06/28/12  Intake/Output from previous day:   Intake/Output this shift:    General appearance: alert, cooperative, appears stated age and no distress Head: Normocephalic, without obvious abnormality, atraumatic Resp: clear to auscultation bilaterally Cardio: regular rate and rhythm, S1, S2 normal, no murmur, click, rub or gallop GI: soft,  non-tender; bowel sounds normal; no masses,  no organomegaly Extremities: extremities normal, atraumatic, no cyanosis or edema Neurologic: Alert and oriented X 3. No change in examination.  Lab Results: No results found for this basename: WBC:2,HGB:2,HCT:2,PLT:2 in the last 72 hours BMET No results found for this basename: NA:2,K:2,CL:2,CO2:2,GLUCOSE:2,BUN:2,CREATININE:2,CALCIUM:2, AST:2,ALT:2 in the last 72 hours  Studies/Results: No results found.  Medications:  Scheduled:    . aspirin EC  81 mg Oral Daily  . enoxaparin  30 mg Subcutaneous Q12H  . ferrous sulfate  325 mg Oral BH-q7a  . FLUoxetine  40 mg Oral BH-q7a  . levothyroxine  112 mcg Oral BH-q7a  . multivitamin with minerals  1 tablet Oral q morning - 10a  . simvastatin  40 mg Oral q1800   Continuous:  JYN:[id number], ondansetron (ZOFRAN) IV, senna-docusate  Assessment/Plan:  Principal Problem:  *Stroke Active Problems:  Hypothyroidism  Anxiety  Elevated blood pressure reading without diagnosis of hypertension  Hyperlipidemia    Acute CVA involving Right Thalamus and Medulla Patient is stable and has some deficits on the left. Stroke work up completed. Patient to take ASA. Statin has been initiated. PT/OT has seen. Appreciate neurology input. EKG showed SR. MRA report reviewed. ECHO and Carotid dopplers reports reviewed. No further intervention needed. Patient is stable for discharge. Please see d/c summary.  Elevated BP She states she used to have HTN but that resolved once she had gastric bypass procedure. But her BP has been trending upwards over the last many months. This can be further  addressed by her PCP.  H/O Hypothyroidism Continue Levothyroxine  Anxiety Disorder Xanax as needed.  H/O Sleep Apnea Not since gastric bypass procedure.  Abnormal UA Asymptomatic. Afebrile. Possibly contaminated. No antibiotics.  Code Status Full Code  DVT Prophylaxis Enoxaparin  Disposition: Home today    LOS: 4 days   [facility]  [facility] Pager [phone] 07/02/2012, 7:23 AM

## 2012-07-08 ENCOUNTER — Other Ambulatory Visit (HOSPITAL_COMMUNITY): Payer: Self-pay | Admitting: Internal Medicine

## 2012-07-08 DIAGNOSIS — Z139 Encounter for screening, unspecified: Secondary | ICD-10-CM

## 2012-07-11 ENCOUNTER — Ambulatory Visit (HOSPITAL_COMMUNITY)
Admission: RE | Admit: 2012-07-11 | Discharge: 2012-07-11 | Disposition: A | Discharge status: Home / Self Care | Payer: Federal, State, Local not specified - PPO | Source: Ambulatory Visit | Attending: Internal Medicine | Admitting: Internal Medicine

## 2012-07-11 DIAGNOSIS — Z1231 Encounter for screening mammogram for malignant neoplasm of breast: Secondary | ICD-10-CM | POA: Insufficient documentation

## 2012-07-11 DIAGNOSIS — Z139 Encounter for screening, unspecified: Secondary | ICD-10-CM

## 2012-12-25 ENCOUNTER — Emergency Department (HOSPITAL_COMMUNITY): Payer: Federal, State, Local not specified - PPO

## 2012-12-25 ENCOUNTER — Encounter (HOSPITAL_COMMUNITY): Payer: Self-pay | Admitting: Emergency Medicine

## 2012-12-25 ENCOUNTER — Emergency Department (HOSPITAL_COMMUNITY)
Admission: EM | Admit: 2012-12-25 | Discharge: 2012-12-25 | Disposition: A | Discharge status: Home / Self Care | Payer: Federal, State, Local not specified - PPO | Attending: Emergency Medicine | Admitting: Emergency Medicine

## 2012-12-25 DIAGNOSIS — Z7982 Long term (current) use of aspirin: Secondary | ICD-10-CM | POA: Insufficient documentation

## 2012-12-25 DIAGNOSIS — Y939 Activity, unspecified: Secondary | ICD-10-CM | POA: Insufficient documentation

## 2012-12-25 DIAGNOSIS — F3289 Other specified depressive episodes: Secondary | ICD-10-CM | POA: Insufficient documentation

## 2012-12-25 DIAGNOSIS — W010XXA Fall on same level from slipping, tripping and stumbling without subsequent striking against object, initial encounter: Secondary | ICD-10-CM | POA: Insufficient documentation

## 2012-12-25 DIAGNOSIS — Z79899 Other long term (current) drug therapy: Secondary | ICD-10-CM | POA: Insufficient documentation

## 2012-12-25 DIAGNOSIS — F329 Major depressive disorder, single episode, unspecified: Secondary | ICD-10-CM | POA: Insufficient documentation

## 2012-12-25 DIAGNOSIS — M810 Age-related osteoporosis without current pathological fracture: Secondary | ICD-10-CM | POA: Insufficient documentation

## 2012-12-25 DIAGNOSIS — S52599A Other fractures of lower end of unspecified radius, initial encounter for closed fracture: Secondary | ICD-10-CM | POA: Insufficient documentation

## 2012-12-25 DIAGNOSIS — F411 Generalized anxiety disorder: Secondary | ICD-10-CM | POA: Insufficient documentation

## 2012-12-25 DIAGNOSIS — I10 Essential (primary) hypertension: Secondary | ICD-10-CM | POA: Insufficient documentation

## 2012-12-25 DIAGNOSIS — Z8669 Personal history of other diseases of the nervous system and sense organs: Secondary | ICD-10-CM | POA: Insufficient documentation

## 2012-12-25 DIAGNOSIS — Y929 Unspecified place or not applicable: Secondary | ICD-10-CM | POA: Insufficient documentation

## 2012-12-25 DIAGNOSIS — S5290XA Unspecified fracture of unspecified forearm, initial encounter for closed fracture: Secondary | ICD-10-CM

## 2012-12-25 DIAGNOSIS — E039 Hypothyroidism, unspecified: Secondary | ICD-10-CM | POA: Insufficient documentation

## 2012-12-25 DIAGNOSIS — W1809XA Striking against other object with subsequent fall, initial encounter: Secondary | ICD-10-CM | POA: Insufficient documentation

## 2012-12-25 DIAGNOSIS — Z9884 Bariatric surgery status: Secondary | ICD-10-CM | POA: Insufficient documentation

## 2012-12-25 MED ORDER — HYDROCODONE-ACETAMINOPHEN 5-325 MG PO TABS: ORAL_TABLET | ORAL | Status: DC

## 2012-12-25 NOTE — ED Provider Notes (Signed)
History     CSN: [id number]  Arrival date & time 12/25/12  1825   First MD Initiated Contact with Patient 12/25/12 1955      Chief Complaint  Patient presents with  . Wrist Pain    (Consider location/radiation/quality/duration/timing/severity/associated sxs/prior treatment) HPI Comments: Patient c/o pain and swelling to the right wrist after she slipped on the ice and fell on an outstretched hand.  Reports immediate swelling tot  He joint.  She denies head injury, neck or back pain or LOC.  Patient is right hand dominant.    Patient is a 67 y.o. female presenting with wrist pain. The history is provided by the patient.  Wrist Pain This is a new problem. Episode onset: several hrs PTA. The problem occurs constantly. The problem has been unchanged. Associated symptoms include arthralgias and joint swelling. Pertinent negatives include no chest pain, chills, fever, headaches, myalgias, nausea, neck pain, numbness, rash, vomiting or weakness. The symptoms are aggravated by bending (movement and palpation). She has tried ice for the symptoms. The treatment provided no relief.    Past Medical History  Diagnosis Date  . Hypothyroidism   . Anxiety   . Depression   . Osteoporosis   . Sleep apnea   . HTN (hypertension)     resolved with weight loss s/p gastric bypass    Past Surgical History  Procedure Date  . Gastric roux-en-y 2006  . Cholecystectomy   . Esophagogastroduodenoscopy 2004    small hiatal hernia  . Colonoscopy 2004    internal hemorrhoids, left-sided diverticulae, splenic flexure polyp (6mm), path unavailable at this time    Family History  Problem Relation Age of Onset  . Cancer      family history   . Coronary artery disease      family history   . Arthritis      family history   . Lung cancer Father     History  Substance Use Topics  . Smoking status: Never Smoker   . Smokeless tobacco: Not on file  . Alcohol Use: No    OB History    Grav Para Term  Preterm Abortions TAB SAB Ect Mult Living                  Review of Systems  Constitutional: Negative for fever and chills.  HENT: Negative for neck pain.   Respiratory: Negative for chest tightness and shortness of breath.   Cardiovascular: Negative for chest pain.  Gastrointestinal: Negative for nausea and vomiting.  Genitourinary: Negative for dysuria and difficulty urinating.  Musculoskeletal: Positive for joint swelling and arthralgias. Negative for myalgias and back pain.  Skin: Negative for color change, rash and wound.  Neurological: Negative for dizziness, weakness, numbness and headaches.  All other systems reviewed and are negative.    Allergies  Amoxicillin-pot clavulanate  Home Medications   Current Outpatient Rx  Name  Route  Sig  Dispense  Refill  . ALPRAZOLAM 0.5 MG PO TABS   Oral   Take 0.5 mg by mouth daily as needed. For anxiety and/or sleep         . ASPIRIN 81 MG PO TBEC   Oral   Take 1 tablet (81 mg total) by mouth daily.   30 tablet   3   . CALTRATE 600 PLUS-VIT D PO   Oral   Take 1 tablet by mouth every morning.         Marland Kitchen VITAMIN D PO   Oral  Take 1 tablet by mouth every morning.         Marland Kitchen VITAMIN B-12 PO   Oral   Take 1 tablet by mouth every morning.         Marland Kitchen FERROUS SULFATE 325 (65 FE) MG PO TABS   Oral   Take 325 mg by mouth every morning.         Marland Kitchen FLUOXETINE HCL 40 MG PO CAPS   Oral   Take 40 mg by mouth every morning.          Marland Kitchen HYDROCODONE-ACETAMINOPHEN 5-325 MG PO TABS      Take one-two tabs po q 4-6 hrs prn pain   6 tablet   0   . HYDROCODONE-ACETAMINOPHEN 5-325 MG PO TABS      Take one-two tabs po q 4-6 hrs prn pain   20 tablet   0   . LEVOTHYROXINE SODIUM 112 MCG PO TABS   Oral   Take 112 mcg by mouth every morning.         . ADULT MULTIVITAMIN W/MINERALS CH   Oral   Take 1 tablet by mouth every morning.         Marland Kitchen SIMVASTATIN 40 MG PO TABS   Oral   Take 0.5 tablets (20 mg total) by  mouth daily at 6 PM.   30 tablet   3     BP 147/78  Pulse 70  Temp 98 F (36.7 C) (Oral)  Resp 20  Ht 5\' 4"  (1.626 m)  Wt 190 lb (86.183 kg)  BMI 32.61 kg/m2  SpO2 99%  Physical Exam  Nursing note and vitals reviewed. Constitutional: She is oriented to person, place, and time. She appears well-developed and well-nourished. No distress.  HENT:  Head: Normocephalic and atraumatic.  Neck: Normal range of motion.  Cardiovascular: Normal rate, regular rhythm, normal heart sounds and intact distal pulses.   No murmur heard. Pulmonary/Chest: Effort normal and breath sounds normal.  Musculoskeletal: She exhibits tenderness. She exhibits no edema.       Right wrist: She exhibits decreased range of motion, tenderness, bony tenderness and swelling. She exhibits no effusion, no crepitus, no deformity and no laceration.       Arms:      ttp of the distal right wrist, moderate STS present.  Radial pulse is brisk, distal sensation intact.  CR< 2 sec.  No proximal tenderness.  Grip strength is strong and symmetrical bilaterally  Lymphadenopathy:    She has no cervical adenopathy.  Neurological: She is alert and oriented to person, place, and time. She exhibits normal muscle tone. Coordination normal.  Skin: Skin is warm and dry.    ED Course  Procedures (including critical care time)  Labs Reviewed - No data to display Dg Wrist Complete Right  12/25/2012  *RADIOLOGY REPORT*  Clinical Data: 67 year old female with right wrist pain following fall and injury.  RIGHT WRIST - COMPLETE 3+ VIEW  Comparison: None  Findings: A nondisplaced transverse fracture through the distal radial metaphysis is identified with vertical extension into the radiocarpal joint. There is no evidence of subluxation or dislocation. No other focal bony abnormalities are identified.  IMPRESSION: Nondisplaced intrarticular distal radial fracture.   Original Report Authenticated By: Harmon Pier, M.D.         Sugar tong  splint applied. Sling given.  Pain improved.  Remains NV intact on recheck   MDM     Pt prefers to f/u with Dr. Romeo Apple.  Agrees  to call his office to arrange f/u appt tomorrow.      Pt is driving, dispensed vicodin pre-pack for home use.  Prescribed: norco #20  Daman Steffenhagen L. Hardy, [place] 12/26/12 2218

## 2012-12-25 NOTE — ED Notes (Signed)
Pt fell on ice today. C/o r wrist pain. Slight swelling to r lateral area. Nad. Pt had ice applied

## 2012-12-26 ENCOUNTER — Ambulatory Visit (INDEPENDENT_AMBULATORY_CARE_PROVIDER_SITE_OTHER): Payer: Federal, State, Local not specified - PPO | Admitting: Orthopedic Surgery

## 2012-12-26 ENCOUNTER — Encounter: Payer: Self-pay | Admitting: Orthopedic Surgery

## 2012-12-26 VITALS — BP 122/76 | Ht 64.0 in | Wt 195.8 lb

## 2012-12-26 DIAGNOSIS — S52599A Other fractures of lower end of unspecified radius, initial encounter for closed fracture: Secondary | ICD-10-CM

## 2012-12-26 DIAGNOSIS — S52509A Unspecified fracture of the lower end of unspecified radius, initial encounter for closed fracture: Secondary | ICD-10-CM

## 2012-12-26 MED ORDER — OXYCODONE-ACETAMINOPHEN 5-325 MG PO TABS: 1.0000 | ORAL_TABLET | ORAL | Status: DC | PRN

## 2012-12-26 NOTE — Progress Notes (Signed)
  Subjective:    Patient ID: Virginia [name], female    DOB: Jan 06, 1946, 67 y.o.   MRN: [id number]  Wrist Pain  The pain is present in the right wrist and right hand. This is a new problem. The current episode started yesterday. There has been a history of trauma. The problem occurs constantly. The problem has been unchanged. The quality of the pain is described as aching, dull and pounding. The pain is severe. Associated symptoms include joint swelling and a limited range of motion. Pertinent negatives include no fever, inability to bear weight, itching, joint locking, numbness or tingling.      Review of Systems  Constitutional: Negative for fever.  Skin: Negative for itching.  Neurological: Negative for tingling and numbness.  All other systems reviewed and are negative.       Objective:   Physical Exam  Nursing note and vitals reviewed. Constitutional: She is oriented to person, place, and time. She appears well-developed and well-nourished. No distress.  HENT:  Head: Normocephalic.  Eyes: Pupils are equal, round, and reactive to light.  Neck: Normal range of motion.  Cardiovascular: Normal rate and intact distal pulses.   Pulmonary/Chest: Effort normal. She has no wheezes.  Abdominal: She exhibits no distension.  Musculoskeletal:       Right shoulder: Normal.       Left shoulder: Normal.       Right elbow: Normal.      Left elbow: Normal.       Right wrist: She exhibits decreased range of motion, tenderness, bony tenderness and swelling. She exhibits no effusion, no crepitus, no deformity and no laceration.       Left wrist: Normal.       Arms:      Lower extremity exam  Ambulation is normal.  The right and left lower extremity:  Inspection and palpation revealed no tenderness or abnormality in alignment in the lower extremities. Range of motion is full.  Strength is grade 5.  All joints are stable.     Neurological: She is alert and oriented to person, place,  and time. She has normal reflexes. She displays normal reflexes. She exhibits normal muscle tone. Coordination normal.  Skin: Skin is warm and dry. No rash noted. She is not diaphoretic. No erythema. No pallor.  Psychiatric: She has a normal mood and affect. Her behavior is normal. Judgment and thought content normal.          Assessment & Plan:

## 2012-12-26 NOTE — Patient Instructions (Addendum)
Keep  Cast dry   Do not get wet   If it gets wet dry with a hair dryer on low setting and call the office   

## 2012-12-27 NOTE — ED Provider Notes (Signed)
Medical screening examination/treatment/procedure(s) were performed by non-physician practitioner and as supervising physician I was immediately available for consultation/collaboration.   Poonam Woehrle L Boss Danielsen, MD 12/27/12 1515 

## 2012-12-30 MED FILL — Hydrocodone-Acetaminophen Tab 5-325 MG: ORAL | Qty: 6 | Status: AC

## 2013-01-11 ENCOUNTER — Other Ambulatory Visit: Payer: Self-pay

## 2013-01-14 ENCOUNTER — Ambulatory Visit (INDEPENDENT_AMBULATORY_CARE_PROVIDER_SITE_OTHER): Payer: Federal, State, Local not specified - PPO

## 2013-01-14 ENCOUNTER — Ambulatory Visit (INDEPENDENT_AMBULATORY_CARE_PROVIDER_SITE_OTHER): Payer: Federal, State, Local not specified - PPO | Admitting: Orthopedic Surgery

## 2013-01-14 VITALS — BP 120/68 | Ht 64.0 in | Wt 195.0 lb

## 2013-01-14 DIAGNOSIS — S62109A Fracture of unspecified carpal bone, unspecified wrist, initial encounter for closed fracture: Secondary | ICD-10-CM

## 2013-01-14 NOTE — Progress Notes (Signed)
Patient ID: Virginia [name], female   DOB: [birth year], 67 y.o.   MRN: [id number] Chief Complaint  Patient presents with  . Follow-up    follow up right wrist fracture DOI 12/25/12    Diagnosis fracture right distal radius  Treatment short arm cast  Followup visit for x-ray  X-ray was ordered and reviewed and the interpretation is that the fracture has stabilized in stable configuration with an adequate radial inclination angle minimal shortening slight dorsal tilt  Recommend continue short arm casting and an x-ray out of plaster next visit

## 2013-01-14 NOTE — Patient Instructions (Signed)
Keep  Cast dry   Do not get wet   If it gets wet dry with a hair dryer on low setting and call the office   

## 2013-02-04 ENCOUNTER — Ambulatory Visit (INDEPENDENT_AMBULATORY_CARE_PROVIDER_SITE_OTHER): Payer: Federal, State, Local not specified - PPO | Admitting: Orthopedic Surgery

## 2013-02-04 ENCOUNTER — Ambulatory Visit (INDEPENDENT_AMBULATORY_CARE_PROVIDER_SITE_OTHER): Payer: Federal, State, Local not specified - PPO

## 2013-02-04 VITALS — BP 140/70 | Ht 64.0 in | Wt 195.0 lb

## 2013-02-04 DIAGNOSIS — S62101D Fracture of unspecified carpal bone, right wrist, subsequent encounter for fracture with routine healing: Secondary | ICD-10-CM

## 2013-02-04 DIAGNOSIS — S5290XD Unspecified fracture of unspecified forearm, subsequent encounter for closed fracture with routine healing: Secondary | ICD-10-CM

## 2013-02-04 DIAGNOSIS — S62109A Fracture of unspecified carpal bone, unspecified wrist, initial encounter for closed fracture: Secondary | ICD-10-CM | POA: Insufficient documentation

## 2013-02-04 NOTE — Patient Instructions (Addendum)
3 x a day I want the brace off and do hand exercises   Wear brace x 4 weeks

## 2013-02-04 NOTE — Progress Notes (Signed)
Patient ID: Virginia [name], female   DOB: 1946/03/12, 67 y.o.   MRN: [id number] Chief Complaint  Patient presents with  . Follow-up    3week recheck xray right wrist out of cast.    History status post fracture right wrist six-week x-ray out of plaster shows good healing good position  Recommend brace for 4 weeks exercise for 6 see me in 6.  Wrist fracture, right, with routine healing, subsequent encounter - Plan: DG Wrist Complete Right

## 2013-03-13 ENCOUNTER — Other Ambulatory Visit: Payer: Self-pay | Admitting: Neurology

## 2013-03-18 ENCOUNTER — Ambulatory Visit (INDEPENDENT_AMBULATORY_CARE_PROVIDER_SITE_OTHER): Payer: Federal, State, Local not specified - PPO | Admitting: Orthopedic Surgery

## 2013-03-18 VITALS — BP 118/64 | Ht 64.0 in | Wt 195.0 lb

## 2013-03-18 DIAGNOSIS — S62101S Fracture of unspecified carpal bone, right wrist, sequela: Secondary | ICD-10-CM

## 2013-03-18 DIAGNOSIS — G5601 Carpal tunnel syndrome, right upper limb: Secondary | ICD-10-CM

## 2013-03-18 DIAGNOSIS — G5600 Carpal tunnel syndrome, unspecified upper limb: Secondary | ICD-10-CM | POA: Insufficient documentation

## 2013-03-18 DIAGNOSIS — S42309S Unspecified fracture of shaft of humerus, unspecified arm, sequela: Secondary | ICD-10-CM

## 2013-03-18 NOTE — Progress Notes (Signed)
Patient ID: Virginia [name], female   DOB: 11/13/1946, 67 y.o.   MRN: [id number] Chief Complaint  Patient presents with  . Follow-up    6 week recheck right wrist following brace, injury date January 29   BP 118/64  Ht 5\' 4"  (1.626 m)  Wt 195 lb (88.451 kg)  BMI 33.46 kg/m2  Diagnosis right distal radius fracture  This is 3 months status post injury. Patient recently took her brace off still has some tiredness and numbness in the right hand especially when the arm is over the head.  She has decreased supination normal pronation decreased flexion normal extension  Grip strength is normal  Shows mild symptoms of carpal tunnel  After discussion we decided to try bracing at night and 6 weeks of vitamin D 600 mg twice a day she'll followup in 6 weeks if she's not improved we will discuss again the possibility of need for carpal tunnel release  I explained to her that she has lost some radial inclination which is making her on the more prominent she's also lost some volar tilt but not enough to warrant surgery.  She is happy with this she is happy that she has gotten to this point and is making good progress

## 2013-03-18 NOTE — Patient Instructions (Signed)
Wear brace only at night and take 100 mg vitamin B6 twice a day for 6 weeks

## 2013-04-29 ENCOUNTER — Ambulatory Visit (INDEPENDENT_AMBULATORY_CARE_PROVIDER_SITE_OTHER): Payer: Federal, State, Local not specified - PPO | Admitting: Orthopedic Surgery

## 2013-04-29 ENCOUNTER — Encounter: Payer: Self-pay | Admitting: Orthopedic Surgery

## 2013-04-29 VITALS — BP 120/70 | Ht 64.0 in | Wt 195.0 lb

## 2013-04-29 DIAGNOSIS — G5601 Carpal tunnel syndrome, right upper limb: Secondary | ICD-10-CM

## 2013-04-29 DIAGNOSIS — G5600 Carpal tunnel syndrome, unspecified upper limb: Secondary | ICD-10-CM

## 2013-04-29 NOTE — Patient Instructions (Addendum)
activities as tolerated 

## 2013-04-29 NOTE — Progress Notes (Signed)
Patient ID: Virginia [name], female   DOB: 1946-01-17, 67 y.o.   MRN: [id number] Chief Complaint  Patient presents with  . Follow-up    6 week recheck right CTS s/p brace and Vit B    CTS post fracture   Improved with brace and Vit B6  She has lost some supination and wrist flexion but overall wrist function is very good   BP 120/70  Ht 5\' 4"  (1.626 m)  Wt 195 lb (88.451 kg)  BMI 33.46 kg/m2 General appearance is normal, the patient is alert and oriented x3 with normal mood and affect. Right wrist no tenderness. She has decreased flexion and supination. No pain good grip strength. Elbow and shoulder are normal  No neurologic deficits at this time carpal tunnel symptoms improved good color and the wrist and hand.  Carpal tunnel syndrome improved continue splinting Right wrist fracture healed.  Activities as tolerated follow up as needed

## 2013-07-25 ENCOUNTER — Encounter (HOSPITAL_COMMUNITY): Payer: Self-pay | Admitting: *Deleted

## 2013-07-25 ENCOUNTER — Emergency Department (HOSPITAL_COMMUNITY): Payer: Federal, State, Local not specified - PPO

## 2013-07-25 ENCOUNTER — Emergency Department (HOSPITAL_COMMUNITY)
Admission: EM | Admit: 2013-07-25 | Discharge: 2013-07-25 | Disposition: A | Discharge status: Home / Self Care | Payer: Federal, State, Local not specified - PPO | Attending: Emergency Medicine | Admitting: Emergency Medicine

## 2013-07-25 DIAGNOSIS — F329 Major depressive disorder, single episode, unspecified: Secondary | ICD-10-CM | POA: Insufficient documentation

## 2013-07-25 DIAGNOSIS — F411 Generalized anxiety disorder: Secondary | ICD-10-CM | POA: Insufficient documentation

## 2013-07-25 DIAGNOSIS — G4730 Sleep apnea, unspecified: Secondary | ICD-10-CM | POA: Insufficient documentation

## 2013-07-25 DIAGNOSIS — Z79899 Other long term (current) drug therapy: Secondary | ICD-10-CM | POA: Insufficient documentation

## 2013-07-25 DIAGNOSIS — S20219A Contusion of unspecified front wall of thorax, initial encounter: Secondary | ICD-10-CM | POA: Insufficient documentation

## 2013-07-25 DIAGNOSIS — I10 Essential (primary) hypertension: Secondary | ICD-10-CM | POA: Insufficient documentation

## 2013-07-25 DIAGNOSIS — Z880 Allergy status to penicillin: Secondary | ICD-10-CM | POA: Insufficient documentation

## 2013-07-25 DIAGNOSIS — E039 Hypothyroidism, unspecified: Secondary | ICD-10-CM | POA: Insufficient documentation

## 2013-07-25 DIAGNOSIS — M810 Age-related osteoporosis without current pathological fracture: Secondary | ICD-10-CM | POA: Insufficient documentation

## 2013-07-25 DIAGNOSIS — Z9884 Bariatric surgery status: Secondary | ICD-10-CM | POA: Insufficient documentation

## 2013-07-25 DIAGNOSIS — F3289 Other specified depressive episodes: Secondary | ICD-10-CM | POA: Insufficient documentation

## 2013-07-25 DIAGNOSIS — Y9301 Activity, walking, marching and hiking: Secondary | ICD-10-CM | POA: Insufficient documentation

## 2013-07-25 DIAGNOSIS — Y92009 Unspecified place in unspecified non-institutional (private) residence as the place of occurrence of the external cause: Secondary | ICD-10-CM | POA: Insufficient documentation

## 2013-07-25 DIAGNOSIS — W1809XA Striking against other object with subsequent fall, initial encounter: Secondary | ICD-10-CM | POA: Insufficient documentation

## 2013-07-25 DIAGNOSIS — Z8673 Personal history of transient ischemic attack (TIA), and cerebral infarction without residual deficits: Secondary | ICD-10-CM | POA: Insufficient documentation

## 2013-07-25 HISTORY — DX: Cerebral infarction, unspecified: I63.9

## 2013-07-25 MED ORDER — HYDROCODONE-ACETAMINOPHEN 5-325 MG PO TABS: 1.0000 | ORAL_TABLET | ORAL | Status: DC | PRN

## 2013-07-25 NOTE — ED Provider Notes (Signed)
CSN: [id number]     Arrival date & time 07/25/13  1010 History   First MD Initiated Contact with Patient 07/25/13 1037     Chief Complaint  Patient presents with  . Fall   (Consider location/radiation/quality/duration/timing/severity/associated sxs/prior Treatment) Patient is a 67 y.o. female presenting with fall. The history is provided by the patient.  Fall This is a new problem. The current episode started 1 to 4 weeks ago. The problem has been gradually worsening. Associated symptoms include chest pain. Pertinent negatives include no chills, coughing, fever, headaches, nausea, rash or vomiting. She has tried rest, acetaminophen and ice for the symptoms. The treatment provided no relief.   Virginia [name] is a 67 y.o. female who presents to the ED for pain in the mid chest for over 2 weeks after a fall. She states that she was walking in the yard and fell. She was caring a flash light and pulled her hand with the flash light to her chest and fell on the flash light. She has tried ice, rest, tylenol and has no relief. She is concerned that she may have fractured her sternum.   Past Medical History  Diagnosis Date  . Hypothyroidism   . Anxiety   . Depression   . Osteoporosis   . Sleep apnea   . HTN (hypertension)     resolved with weight loss s/p gastric bypass  . Stroke    Past Surgical History  Procedure Laterality Date  . Gastric roux-en-y  2006  . Cholecystectomy    . Esophagogastroduodenoscopy  2004    small hiatal hernia  . Colonoscopy  2004    internal hemorrhoids, left-sided diverticulae, splenic flexure polyp (6mm), path unavailable at this time   Family History  Problem Relation Age of Onset  . Cancer      family history   . Coronary artery disease      family history   . Arthritis      family history   . Lung cancer Father    History  Substance Use Topics  . Smoking status: Never Smoker   . Smokeless tobacco: Not on file  . Alcohol Use: No   OB History     Grav Para Term Preterm Abortions TAB SAB Ect Mult Living                 Review of Systems  Constitutional: Negative for fever and chills.  Respiratory: Negative for cough and chest tightness.   Cardiovascular: Positive for chest pain.  Gastrointestinal: Negative for nausea and vomiting.  Skin: Negative for rash.  Neurological: Negative for dizziness and headaches.  Psychiatric/Behavioral: The patient is not nervous/anxious.     Allergies  Amoxicillin-pot clavulanate  Home Medications   Current Outpatient Rx  Name  Route  Sig  Dispense  Refill  . ALPRAZolam (XANAX) 0.5 MG tablet   Oral   Take 0.5 mg by mouth daily as needed. For anxiety and/or sleep         . Calcium-Vitamin D (CALTRATE 600 PLUS-VIT D PO)   Oral   Take 1 tablet by mouth every morning.         . Cholecalciferol (VITAMIN D PO)   Oral   Take 1 tablet by mouth every morning.         . Cyanocobalamin (VITAMIN B-12 PO)   Oral   Take 1 tablet by mouth every morning.         . ferrous sulfate 325 (65 FE) MG  tablet   Oral   Take 325 mg by mouth every morning.         Marland Kitchen FLUoxetine (PROZAC) 40 MG capsule   Oral   Take 40 mg by mouth every morning.          Marland Kitchen HYDROcodone-acetaminophen (NORCO/VICODIN) 5-325 MG per tablet      Take one-two tabs po q 4-6 hrs prn pain   6 tablet   0   . HYDROcodone-acetaminophen (NORCO/VICODIN) 5-325 MG per tablet      Take one-two tabs po q 4-6 hrs prn pain   20 tablet   0   . levothyroxine (SYNTHROID, LEVOTHROID) 112 MCG tablet   Oral   Take 112 mcg by mouth every morning.         . Multiple Vitamin (MULTIVITAMIN WITH MINERALS) TABS   Oral   Take 1 tablet by mouth every morning.         Marland Kitchen oxyCODONE-acetaminophen (PERCOCET/ROXICET) 5-325 MG per tablet   Oral   Take 1 tablet by mouth every 4 (four) hours as needed for pain.   90 tablet   0   . rOPINIRole (REQUIP) 0.25 MG tablet      TAKE 1 TABLET IN THE EVENING   90 tablet   3   .  EXPIRED: simvastatin (ZOCOR) 40 MG tablet   Oral   Take 0.5 tablets (20 mg total) by mouth daily at 6 PM.   30 tablet   3    BP 144/62  Pulse 71  Temp(Src) 97.9 F (36.6 C) (Oral)  Resp 18  Ht 5\' 3"  (1.6 m)  Wt 195 lb (88.451 kg)  BMI 34.55 kg/m2  SpO2 97% Physical Exam  Nursing note and vitals reviewed. Constitutional: She is oriented to person, place, and time. She appears well-developed and well-nourished. No distress.  HENT:  Head: Normocephalic and atraumatic.  Eyes: Conjunctivae and EOM are normal.  Neck: Normal range of motion. Neck supple.  Cardiovascular: Normal rate and regular rhythm.   Pulmonary/Chest: Effort normal and breath sounds normal. She exhibits tenderness.    Tender with palpation to sternum. No ecchymosis noted.  Abdominal: Soft. There is no tenderness.  Musculoskeletal: Normal range of motion.  Neurological: She is alert and oriented to person, place, and time. She has normal strength. No cranial nerve deficit or sensory deficit. Gait normal.  Skin: Skin is warm and dry.  Psychiatric: She has a normal mood and affect. Her behavior is normal.   Dg Sternum  07/25/2013   *RADIOLOGY REPORT*  Clinical Data: Traumatic injury and pain  STERNUM - 2+ VIEW  Comparison: None.  Findings: Cardiac shadow is within normal limits.  The lungs are clear.  No acute bony abnormality is seen.  IMPRESSION: No acute abnormality noted.   Original Report Authenticated By: Alcide Clever, M.D.    ED Course  Procedures  MDM  67 y.o. female with contusion to mid chest area s/p fall. Will treat pain and she will take NSAIDS and follow up with her PCP. She will return here as needed for any problems.  I have reviewed this patient's vital signs, nurses notes, appropriate labs and imaging.     Medication List    TAKE these medications       HYDROcodone-acetaminophen 5-325 MG per tablet  Commonly known as:  NORCO/VICODIN  Take 1 tablet by mouth every 4 (four) hours as needed.        ASK your doctor about these medications  ALPRAZolam 0.5 MG tablet  Commonly known as:  XANAX  Take 0.5 mg by mouth daily as needed. For anxiety and/or sleep     CALTRATE 600 PLUS-VIT D PO  Take 1 tablet by mouth every morning.     ferrous sulfate 325 (65 FE) MG tablet  Take 325 mg by mouth every morning.     FLUoxetine 40 MG capsule  Commonly known as:  PROZAC  Take 40 mg by mouth every morning.     levothyroxine 100 MCG tablet  Commonly known as:  SYNTHROID, LEVOTHROID  Take 100 mcg by mouth daily before breakfast.     multivitamin with minerals Tabs tablet  Take 1 tablet by mouth every morning.     rOPINIRole 0.25 MG tablet  Commonly known as:  REQUIP  TAKE 1 TABLET IN THE EVENING     simvastatin 20 MG tablet  Commonly known as:  ZOCOR  Take 20 mg by mouth daily.     VITAMIN B-12 PO  Take 1 tablet by mouth every morning.           [facility] Orlene Och, [place] 07/25/13 1515

## 2013-07-25 NOTE — ED Notes (Signed)
Pt updated on wait status 

## 2013-07-25 NOTE — ED Notes (Signed)
Pt alert & oriented x4, stable gait. Patient given discharge instructions, paperwork & prescription(s). Patient  instructed to stop at the registration desk to finish any additional paperwork. Patient verbalized understanding. Pt left department w/ no further questions. 

## 2013-07-25 NOTE — ED Notes (Signed)
Pt states she lost her balance and fell forward on Aug. 10. Fell on a flash light. Pt states she is concerned that she may have fx sternum.

## 2013-07-28 NOTE — ED Provider Notes (Signed)
Medical screening examination/treatment/procedure(s) were performed by non-physician practitioner and as supervising physician I was immediately available for consultation/collaboration.  Loralei Radcliffe T Kanetra Ho, MD 07/28/13 0831 

## 2013-09-12 ENCOUNTER — Other Ambulatory Visit: Payer: Self-pay | Admitting: Neurology

## 2013-09-15 NOTE — Telephone Encounter (Signed)
Dr Dohmeier previously prescribed this med in [employer]

## 2013-10-02 ENCOUNTER — Other Ambulatory Visit: Payer: Self-pay

## 2013-10-06 DIAGNOSIS — Z23 Encounter for immunization: Secondary | ICD-10-CM | POA: Diagnosis not present

## 2013-11-25 ENCOUNTER — Encounter: Payer: Self-pay | Admitting: Neurology

## 2013-11-25 ENCOUNTER — Ambulatory Visit: Payer: Self-pay | Admitting: Neurology

## 2013-12-02 ENCOUNTER — Encounter: Payer: Self-pay | Admitting: Neurology

## 2013-12-02 ENCOUNTER — Ambulatory Visit (INDEPENDENT_AMBULATORY_CARE_PROVIDER_SITE_OTHER): Payer: Medicare Other | Admitting: Neurology

## 2013-12-02 VITALS — BP 144/80 | HR 67 | Resp 17 | Ht 64.0 in | Wt 196.0 lb

## 2013-12-02 DIAGNOSIS — G4733 Obstructive sleep apnea (adult) (pediatric): Secondary | ICD-10-CM | POA: Diagnosis not present

## 2013-12-02 DIAGNOSIS — I6350 Cerebral infarction due to unspecified occlusion or stenosis of unspecified cerebral artery: Secondary | ICD-10-CM | POA: Diagnosis not present

## 2013-12-02 DIAGNOSIS — G2581 Restless legs syndrome: Secondary | ICD-10-CM

## 2013-12-02 DIAGNOSIS — Z9989 Dependence on other enabling machines and devices: Principal | ICD-10-CM

## 2013-12-02 DIAGNOSIS — I639 Cerebral infarction, unspecified: Secondary | ICD-10-CM

## 2013-12-02 MED ORDER — ROPINIROLE HCL 0.25 MG PO TABS: 0.2500 mg | ORAL_TABLET | Freq: Every day | ORAL | Status: DC

## 2013-12-02 MED ORDER — SIMVASTATIN 40 MG PO TABS: 40.0000 mg | ORAL_TABLET | Freq: Every day | ORAL | Status: DC

## 2013-12-02 NOTE — Progress Notes (Addendum)
[facility]  Provider:  Larey Seat, M D  Referring Provider: Redmond School, MD Primary Care Physician:  [name], MD  Chief Complaint  Patient presents with  . Follow-up    1 yr. per EMR, Rm 11    HPI:  Virginia [name] is a 68 y.o. female ,  seen here as a revisit  from [place], who sees the patient for Stroke since August 2nd 2013 .  At that time developed left-sided hemiparesis involving face and arm predominantly. The left side was affected. She has returned to the current , normal level of  function . In  October  2013 , the patient was tested for sleep apnea followed by a CPAP titration. PolySomnogram was ordered by Dr Jannifer Franklin to check for secondary risk for stroke .  The patient had previously been diagnosed with sleep apnea in 2006 , but at that time the RDI was 19 / hour and she had PLM related arousals  more frequently than apnea related events.  At that time,  she was also considered morbidly obese and a candidate for gastric  bypass surgery.  After she underwent weight loss surgery a weight loss of 100 pounds resulted . This also seemed to have resolved obstructive sleep apnea at least for a period of time , but over the years she had regained about 50 pounds and she noted a return of the symptoms , which brought her back to the sleep lab.  On 08-11-12  She underwent a diagnostic polysomnogram,  which documented an AHI of 17.9  and on 09-15-12 she was titrated successfully to CPAP and optimal titration device was ordered for the patient it seemed that she responded best to very low pressure such as 6 cm water.  A compliance download was obtained today for a whole year. Her twelve-month download short periods during which the CPAP could not be used.  The gap in 12/28/2012 resulted from a fall with a fracture of the  right wrist, the patient was unable to use the headgear and actually slept in a recliner.  The patient lives with her psychiatrically  ill daughter , who has ADHD, OCD claustrophobia and major depression. The second time period in October resulted from a psychiatric break down after her brother's wedding party. [employer]. Her daughter's daughter is 39 and also living with them. The patient was under a lot of stress and finally her daughter was committed.  She is her daughter's (27 y.o.)  main caretaker.    CPAP compliance : The patient fulfills the CMS requirement , AHI is 3.0, and auto -titrator is not restricted- the pressure window is between 4 and 20 CM water?  72% compliance over 12 month . 95% percentile pressure is 9.6 cm water.  In the last 90 days, her compliance was over 85% . 97% CPAP compliance in 2013.    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  She is now on Medicare , and has needed to get new CPAP supplies.      Review of Systems: Out of a complete 14 system review, the patient complains of only the following symptoms, and all other reviewed systems are negative. Patient endorsed GDS at 6 points, FSS 35, Epworth zero.   Please remind patients to bring CPAP machine to visit. [facility] is DME , [facility].   History   Social History  . Marital Status: Divorced    Spouse Name: N/A    Number of Children:  1  . Years of Education: college   Occupational History  . retired    .     Social History Main Topics  . Smoking status: Never Smoker   . Smokeless tobacco: Not on file  . Alcohol Use: No  . Drug Use: No  . Sexual Activity: Not on file   Other Topics Concern  . Not on file   Social History Narrative  . No narrative on file    Family History  Problem Relation Age of Onset  . Cancer      family history   . Coronary artery disease      family history   . Arthritis      family history   . Lung cancer Father   . Stroke Father   . Stroke Mother   . Stroke Brother   . Stroke Maternal Grandmother   . Stroke Maternal Grandfather     Past Medical History  Diagnosis Date  .  Hypothyroidism   . Anxiety   . Depression   . Osteoporosis   . Sleep apnea   . HTN (hypertension)     resolved with weight loss s/p gastric bypass  . Stroke   . RLS (restless legs syndrome)     Past Surgical History  Procedure Laterality Date  . Gastric roux-en-y  2006  . Cholecystectomy    . Esophagogastroduodenoscopy  2004    small hiatal hernia  . Colonoscopy  2004    internal hemorrhoids, left-sided diverticulae, splenic flexure polyp (51mm), path unavailable at this time    Current Outpatient Prescriptions  Medication Sig Dispense Refill  . ALPRAZolam (XANAX) 0.5 MG tablet Take 0.5 mg by mouth daily as needed. For anxiety and/or sleep      . aspirin 81 MG tablet Take 81 mg by mouth daily.      . Calcium Carbonate-Vit D-Min (CALCIUM 1200 PO) Take 1,200 mg by mouth daily.      . Cholecalciferol (VITAMIN D-3) 1000 UNITS CAPS Take 1,000 Units by mouth daily.      . Cyanocobalamin (VITAMIN B-12 PO) Take 1 tablet by mouth every morning.      Marland Kitchen FLUoxetine (PROZAC) 40 MG capsule Take 40 mg by mouth every morning.       Marland Kitchen levothyroxine (SYNTHROID, LEVOTHROID) 100 MCG tablet Take 100 mcg by mouth daily before breakfast.      . Multiple Vitamin (MULTIVITAMIN WITH MINERALS) TABS Take 1 tablet by mouth every morning.      Marland Kitchen rOPINIRole (REQUIP) 0.25 MG tablet Take 1 tablet (0.25 mg total) by mouth at bedtime.  90 tablet  3  . simvastatin (ZOCOR) 40 MG tablet Take 1 tablet (40 mg total) by mouth daily at 6 PM.  45 tablet  0   No current facility-administered medications for this visit.    Allergies as of 12/02/2013 - Review Complete 12/02/2013  Allergen Reaction Noted  . Amoxicillin-pot clavulanate Hives and Rash     Vitals: BP 144/80  Pulse 67  Resp 17  Ht 5\' 4"  (1.626 m)  Wt 196 lb (88.905 kg)  BMI 33.63 kg/m2 Last Weight:  Wt Readings from Last 1 Encounters:  12/02/13 196 lb (88.905 kg)   Last Height:   Ht Readings from Last 1 Encounters:  12/02/13 5\' 4"  (1.626 m)     Physical exam:  General: The patient is awake, alert and appears not in acute distress. The patient is well groomed. Head: Normocephalic, atraumatic. Neck is supple. Mallampati 3  neck circumference: 15.5  Cardiovascular:  Regular rate and rhythm, without  murmurs or carotid bruit, and without distended neck veins. Respiratory: Lungs are clear to auscultation. Skin:  Without evidence of edema, or rash Trunk: BMI is elevated , normal posture.  Neurologic exam : The patient is awake and alert, oriented to place and time.  Memory subjective described as intact.  There is a normal attention span & concentration ability. Speech is fluent without dysarthria, dysphonia or aphasia. Mood and affect are depressed .  Cranial nerves: Pupils are equal and briskly reactive to light. Funduscopic exam without  evidence of pallor or edema.  Extraocular movements  in vertical and horizontal planes intact and without nystagmus. Visual fields by finger perimetry are intact. Hearing to finger rub intact.  Facial sensation intact to fine touch. Facial motor strength is symmetric and tongue and uvula move midline.  Motor exam:    The patient has symmetric muscle tone and symmetric muscle mass between left and right body, there is a slight weakness of the hip flexor and a milder weakness of the supinator. Sensory:  Fine touch, pinprick and vibration were tested in all extremities. Proprioception normal.  Coordination: Rapid alternating movements in the fingers/hands is tested and normal, without evidence of ataxia, dysmetria or tremor.  Gait and station: Patient walks without assistive device .  Deep tendon reflexes: in the  upper and lower extremities are symmetric and intact.     Assessment:  After physical and neurologic examination, review of laboratory studies, imaging, neurophysiology testing and pre-existing records, assessment is   Patient with mild to moderate OSA , history of CVA, still obese -  but not morbidly .  CPAP auto-titrator allows for residual AHI of 3.0 , sufficient control. (95% @ 9.x cm water ) compliance for the last 90 days is very good, following the pause in October.   Patient has 2 medical reasons for interrupted CPAP use.   RLS - on Requip with some anticipation.   Plan:  Treatment plan and additional workup : continue CPAP use, I would like to limit the auto -titrator to 5-10 cm water, new mask and headgear ordered. Medicare guidelines applied.

## 2013-12-02 NOTE — Addendum Note (Signed)
Addended by: Larey Seat on: 12/02/2013 12:31 PM   Modules accepted: Orders, Medications

## 2013-12-05 ENCOUNTER — Encounter: Payer: Self-pay | Admitting: Neurology

## 2013-12-10 ENCOUNTER — Other Ambulatory Visit (HOSPITAL_COMMUNITY): Payer: Self-pay | Admitting: Internal Medicine

## 2013-12-10 DIAGNOSIS — E039 Hypothyroidism, unspecified: Secondary | ICD-10-CM | POA: Diagnosis not present

## 2013-12-10 DIAGNOSIS — Z6835 Body mass index (BMI) 35.0-35.9, adult: Secondary | ICD-10-CM | POA: Diagnosis not present

## 2013-12-10 DIAGNOSIS — I10 Essential (primary) hypertension: Secondary | ICD-10-CM | POA: Diagnosis not present

## 2013-12-10 DIAGNOSIS — Z01419 Encounter for gynecological examination (general) (routine) without abnormal findings: Secondary | ICD-10-CM | POA: Diagnosis not present

## 2013-12-19 ENCOUNTER — Encounter: Payer: Self-pay | Admitting: Neurology

## 2013-12-29 ENCOUNTER — Other Ambulatory Visit (HOSPITAL_COMMUNITY): Payer: Self-pay | Admitting: Internal Medicine

## 2013-12-29 DIAGNOSIS — M810 Age-related osteoporosis without current pathological fracture: Secondary | ICD-10-CM

## 2013-12-31 ENCOUNTER — Other Ambulatory Visit (HOSPITAL_COMMUNITY): Payer: Federal, State, Local not specified - PPO

## 2014-01-02 ENCOUNTER — Encounter: Payer: Self-pay | Admitting: Neurology

## 2014-01-05 ENCOUNTER — Other Ambulatory Visit (HOSPITAL_COMMUNITY): Payer: Self-pay | Admitting: Internal Medicine

## 2014-01-05 ENCOUNTER — Encounter: Payer: Self-pay | Admitting: Neurology

## 2014-01-05 DIAGNOSIS — Z139 Encounter for screening, unspecified: Secondary | ICD-10-CM

## 2014-01-07 ENCOUNTER — Ambulatory Visit (HOSPITAL_COMMUNITY)
Admission: RE | Admit: 2014-01-07 | Discharge: 2014-01-07 | Disposition: A | Discharge status: Home / Self Care | Payer: Medicare Other | Source: Ambulatory Visit | Attending: Internal Medicine | Admitting: Internal Medicine

## 2014-01-07 DIAGNOSIS — M899 Disorder of bone, unspecified: Secondary | ICD-10-CM | POA: Insufficient documentation

## 2014-01-07 DIAGNOSIS — Z780 Asymptomatic menopausal state: Secondary | ICD-10-CM | POA: Diagnosis not present

## 2014-01-07 DIAGNOSIS — M949 Disorder of cartilage, unspecified: Principal | ICD-10-CM

## 2014-01-07 DIAGNOSIS — M810 Age-related osteoporosis without current pathological fracture: Secondary | ICD-10-CM

## 2014-01-08 ENCOUNTER — Other Ambulatory Visit (HOSPITAL_COMMUNITY): Payer: Self-pay | Admitting: Internal Medicine

## 2014-01-08 ENCOUNTER — Ambulatory Visit (HOSPITAL_COMMUNITY)
Admission: RE | Admit: 2014-01-08 | Discharge: 2014-01-08 | Disposition: A | Discharge status: Home / Self Care | Payer: Medicare Other | Source: Ambulatory Visit | Attending: Internal Medicine | Admitting: Internal Medicine

## 2014-01-08 DIAGNOSIS — Z1231 Encounter for screening mammogram for malignant neoplasm of breast: Secondary | ICD-10-CM | POA: Diagnosis not present

## 2014-01-12 ENCOUNTER — Other Ambulatory Visit: Payer: Self-pay | Admitting: Internal Medicine

## 2014-01-12 DIAGNOSIS — R928 Other abnormal and inconclusive findings on diagnostic imaging of breast: Secondary | ICD-10-CM

## 2014-01-14 ENCOUNTER — Ambulatory Visit (HOSPITAL_COMMUNITY)
Admission: RE | Admit: 2014-01-14 | Discharge: 2014-01-14 | Disposition: A | Discharge status: Home / Self Care | Payer: Medicare Other | Source: Ambulatory Visit | Attending: Internal Medicine | Admitting: Internal Medicine

## 2014-01-14 DIAGNOSIS — R928 Other abnormal and inconclusive findings on diagnostic imaging of breast: Secondary | ICD-10-CM | POA: Insufficient documentation

## 2014-01-14 DIAGNOSIS — R922 Inconclusive mammogram: Secondary | ICD-10-CM | POA: Diagnosis not present

## 2014-01-15 ENCOUNTER — Ambulatory Visit (INDEPENDENT_AMBULATORY_CARE_PROVIDER_SITE_OTHER): Payer: Medicare Other | Admitting: Otolaryngology

## 2014-01-15 ENCOUNTER — Ambulatory Visit: Payer: Medicare Other | Admitting: Podiatry

## 2014-01-15 DIAGNOSIS — H903 Sensorineural hearing loss, bilateral: Secondary | ICD-10-CM | POA: Diagnosis not present

## 2014-01-22 ENCOUNTER — Encounter: Payer: Self-pay | Admitting: Podiatry

## 2014-01-22 ENCOUNTER — Other Ambulatory Visit: Payer: Self-pay | Admitting: Podiatry

## 2014-01-22 ENCOUNTER — Ambulatory Visit (INDEPENDENT_AMBULATORY_CARE_PROVIDER_SITE_OTHER): Payer: Medicare Other

## 2014-01-22 ENCOUNTER — Ambulatory Visit (INDEPENDENT_AMBULATORY_CARE_PROVIDER_SITE_OTHER): Payer: Medicare Other | Admitting: Podiatry

## 2014-01-22 VITALS — BP 112/56 | HR 81 | Resp 16 | Ht 64.0 in | Wt 190.0 lb

## 2014-01-22 DIAGNOSIS — M2010 Hallux valgus (acquired), unspecified foot: Secondary | ICD-10-CM | POA: Diagnosis not present

## 2014-01-22 DIAGNOSIS — M2040 Other hammer toe(s) (acquired), unspecified foot: Secondary | ICD-10-CM | POA: Diagnosis not present

## 2014-01-22 DIAGNOSIS — I6350 Cerebral infarction due to unspecified occlusion or stenosis of unspecified cerebral artery: Secondary | ICD-10-CM

## 2014-01-22 NOTE — Progress Notes (Signed)
   Subjective:    Patient ID: Virginia [name], female    DOB: 1945/12/14, 68 y.o.   MRN: [id number]  HPI Comments: "I have a problem on this right foot"  Patient c/o tenderness 1st MPJ and 2nd toe right foot for several years. The 2nd toe overlaps the 1st toe and is red at the knuckle. The 1st toe and MPJ is callused medially. She wears a toe strap that holds the 2nd to the 3rd toe and a spacer between the 1st and 2nd toes. This does help.     Review of Systems  All other systems reviewed and are negative.       Objective:   Physical Exam        Assessment & Plan:

## 2014-01-22 NOTE — Patient Instructions (Signed)
Bunion (Hallux Valgus) A bony bump (protrusion) on the inside of the foot, at the base of the first toe, is called a bunion (hallux valgus). A bunion causes the first toe to angle toward the other toes. SYMPTOMS   A bony bump on the inside of the foot, causing an outward turning of the first toe. It may also overlap the second toe.  Thickening of the skin (callus) over the bony bump.  Fluid buildup under the callus. Fluid may become red, tender, and swollen (inflamed) with constant irritation or pressure.  Foot pain and stiffness. CAUSES  Many causes exist, including:  Inherited from your family (genetics).  Injury (trauma) forcing the first toe into a position in which it overlaps other toes.  Bunions are also associated with wearing shoes that have a narrow toe box (pointy shoes). RISK INCREASES WITH:  Family history of foot abnormalities, especially bunions.  Arthritis.  Narrow shoes, especially high heels. PREVENTION  Wear shoes with a wide toe box.  Avoid shoes with high heels.  Wear a small pad between the big toe and second toe.  Maintain proper conditioning:  Foot and ankle flexibility.  Muscle strength and endurance. PROGNOSIS  With proper treatment, bunions can typically be cured. Occasionally, surgery is required.  RELATED COMPLICATIONS   Infection of the bunion.  Arthritis of the first toe.  Risks of surgery, including infection, bleeding, injury to nerves (numb toe), recurrent bunion, overcorrection (toe points inward), arthritis of the big toe, big toe pointing upward, and bone not healing. TREATMENT  Treatment first consists of stopping the activities that aggravate the pain, taking pain medicines, and icing to reduce inflammation and pain. Wear shoes with a wide toe box. Shoes can be modified by a shoe repair person to relieve pressure on the bunion, especially if you cannot find shoes with a wide enough toe box. You may also place a pad with the  center cut out in your shoe, to reduce pressure on the bunion. Sometimes, an arch support (orthotic) may reduce pressure on the bunion and alleviate the symptoms. Stretching and strengthening exercises for the muscles of the foot may be useful. You may choose to wear a brace or pad at night to hold the big toe away from the second toe. If non-surgical treatments are not successful, surgery may be needed. Surgery involves removing the overgrown tissue and correcting the position of the first toe, by realigning the bones. Bunion surgery is typically performed on an outpatient basis, meaning you can go home the same day as surgery. The surgery may involve cutting the mid portion of the bone of the first toe, or just cutting and repairing (reconstructing) the ligaments and soft tissues around the first toe.  MEDICATION   If pain medicine is needed, nonsteroidal anti-inflammatory medicines, such as aspirin and ibuprofen, or other minor pain relievers, such as acetaminophen, are often recommended.  Do not take pain medicine for 7 days before surgery.  Prescription pain relievers are usually only prescribed after surgery. Use only as directed and only as much as you need.  Ointments applied to the skin may be helpful. HEAT AND COLD  Cold treatment (icing) relieves pain and reduces inflammation. Cold treatment should be applied for 10 to 15 minutes every 2 to 3 hours for inflammation and pain and immediately after any activity that aggravates your symptoms. Use ice packs or an ice massage.  Heat treatment may be used prior to performing the stretching and strengthening activities prescribed by your   caregiver, physical therapist, or athletic trainer. Use a heat pack or a warm soak. SEEK MEDICAL CARE IF:   Symptoms get worse or do not improve in 2 weeks, despite treatment.  After surgery, you develop fever, increasing pain, redness, swelling, drainage of fluids, bleeding, or increasing warmth around the  surgical area.  New, unexplained symptoms develop. (Drugs used in treatment may produce side effects.) Document Released: 11/13/2005 Document Revised: 02/05/2012 Document Reviewed: 02/25/2009 ExitCare Patient Information 2014 [employer].   Hammer Toes Hammer toes is a condition in which one or more of your toes is permanently flexed. CAUSES  This happens when a muscle imbalance or abnormal bone length makes your small toes buckle. This causes the toe joint to contract and the strong cord-like bands that attach muscles to the bones (tendons) in your toes to shorten.  SIGNS AND SYMPTOMS  Common symptoms of flexible hammer toes include:   A build up of skin cells (Corns). Corns occur where boney bumps come in frequent contact with hard surfaces. For example, where your shoes press and rub.  Irritation.  Inflammation.  Pain.  Limited motion in your toes DIAGNOSIS  Hammer toes are diagnosed through a physical exam of your toes.During the exam, your health care provider may try to reproduce your symptoms by manipulating your foot. Often, x-ray exams are done to determine the degree of deformity and to make sure that the cause is not a fracture.  TREATMENT  Hammer toes can be treated with corrective surgery. There are several types of surgical procedures that can treat hammer toes. The most common procedures include:  Arthroplasty A portion of the joint is surgically removed and your toe is straightened. The gap fills in with fibrous tissue. This procedure helps treat pain and deformity and helps restore function.  Fusion Cartilage between the two bones of the affected joint is taken out and the bones fuse together into one longer bone. This helps keep your toe stable and reduces pain but leaves your toe stiff, yet straight.  Implantation A portion of your bone is removed and replaced with an implant to restore motion.  Flexor tendon transfers This procedure repositions the tendons  that curl the toes down (flexor tendons). This may be done to release the deforming force that causes your toe to buckle. Several of these procedures require fixing your toe with a pin that is visible at the tip of your toe. The pin keeps the toe straight during healing. Your health care provider will remove the pin usually within 4 8 weeks after the procedure.  Document Released: 11/10/2000 Document Revised: 09/03/2013 Document Reviewed: 07/21/2013 ExitCare Patient Information 2014 [employer].  

## 2014-01-23 NOTE — Progress Notes (Signed)
Subjective:     Patient ID: Virginia [name], female   DOB: 1946/10/04, 68 y.o.   MRN: [id number]  Foot Pain   patient presents with severe bunion deformity right with rigid contracture of digit 2 and pain on palpation dorsally second toe and keratotic tissue around the inside. States she's been using padding which helps some  Review of Systems  All other systems reviewed and are negative.       Objective:   Physical Exam  Nursing note and vitals reviewed. Constitutional: She is oriented to person, place, and time.  Cardiovascular: Intact distal pulses.   Musculoskeletal: Normal range of motion.  Neurological: She is oriented to person, place, and time.  Skin: Skin is dry.   neurovascular status intact with muscle strength adequate and range of motion within normal limits. Patient's forefoot right over left shows severe digital deformity of the second toe with rigid fracture noted. Patient has under lapping big toe with significant structural bunion deformity right over left     Assessment:     Significant for foot malalignment with rigid contracture of lesser digits and structural bunion right over left that is moderately symptomatic    Plan:     H&P and x-rays reviewed with patient. Discussed the nature of this condition and discussed surgical intervention for this right foot that can be done if necessary. Patient wants to hold off and continue to use padding understanding it may get more difficult to fix

## 2014-02-05 ENCOUNTER — Encounter: Payer: Self-pay | Admitting: Neurology

## 2014-02-05 DIAGNOSIS — M1610 Unilateral primary osteoarthritis, unspecified hip: Secondary | ICD-10-CM | POA: Diagnosis not present

## 2014-02-05 DIAGNOSIS — I10 Essential (primary) hypertension: Secondary | ICD-10-CM | POA: Diagnosis not present

## 2014-02-05 DIAGNOSIS — F411 Generalized anxiety disorder: Secondary | ICD-10-CM | POA: Diagnosis not present

## 2014-02-05 DIAGNOSIS — M169 Osteoarthritis of hip, unspecified: Secondary | ICD-10-CM | POA: Diagnosis not present

## 2014-02-05 DIAGNOSIS — E039 Hypothyroidism, unspecified: Secondary | ICD-10-CM | POA: Diagnosis not present

## 2014-02-06 ENCOUNTER — Encounter: Payer: Self-pay | Admitting: Neurology

## 2014-02-15 ENCOUNTER — Other Ambulatory Visit: Payer: Self-pay | Admitting: Neurology

## 2014-02-16 NOTE — Telephone Encounter (Signed)
This was prescribed at [place] in January

## 2014-02-18 ENCOUNTER — Other Ambulatory Visit (HOSPITAL_COMMUNITY): Payer: Self-pay | Admitting: Physician Assistant

## 2014-02-18 ENCOUNTER — Ambulatory Visit (HOSPITAL_COMMUNITY)
Admission: RE | Admit: 2014-02-18 | Discharge: 2014-02-18 | Disposition: A | Discharge status: Home / Self Care | Payer: Medicare Other | Source: Ambulatory Visit | Attending: Physician Assistant | Admitting: Physician Assistant

## 2014-02-18 DIAGNOSIS — M169 Osteoarthritis of hip, unspecified: Secondary | ICD-10-CM | POA: Diagnosis not present

## 2014-02-18 DIAGNOSIS — R1032 Left lower quadrant pain: Secondary | ICD-10-CM | POA: Diagnosis not present

## 2014-02-18 DIAGNOSIS — M25559 Pain in unspecified hip: Secondary | ICD-10-CM

## 2014-02-18 DIAGNOSIS — Z6834 Body mass index (BMI) 34.0-34.9, adult: Secondary | ICD-10-CM | POA: Diagnosis not present

## 2014-02-18 DIAGNOSIS — M1610 Unilateral primary osteoarthritis, unspecified hip: Secondary | ICD-10-CM | POA: Diagnosis not present

## 2014-04-17 DIAGNOSIS — Z6835 Body mass index (BMI) 35.0-35.9, adult: Secondary | ICD-10-CM | POA: Diagnosis not present

## 2014-04-17 DIAGNOSIS — N390 Urinary tract infection, site not specified: Secondary | ICD-10-CM | POA: Diagnosis not present

## 2014-04-29 DIAGNOSIS — L509 Urticaria, unspecified: Secondary | ICD-10-CM | POA: Diagnosis not present

## 2014-04-30 DIAGNOSIS — Z6835 Body mass index (BMI) 35.0-35.9, adult: Secondary | ICD-10-CM | POA: Diagnosis not present

## 2014-04-30 DIAGNOSIS — L509 Urticaria, unspecified: Secondary | ICD-10-CM | POA: Diagnosis not present

## 2014-08-27 DIAGNOSIS — Z23 Encounter for immunization: Secondary | ICD-10-CM | POA: Diagnosis not present

## 2014-09-11 ENCOUNTER — Other Ambulatory Visit: Payer: Self-pay

## 2014-10-09 ENCOUNTER — Encounter: Payer: Self-pay | Admitting: Neurology

## 2014-11-05 ENCOUNTER — Ambulatory Visit (INDEPENDENT_AMBULATORY_CARE_PROVIDER_SITE_OTHER): Payer: Medicare Other

## 2014-11-05 ENCOUNTER — Ambulatory Visit (INDEPENDENT_AMBULATORY_CARE_PROVIDER_SITE_OTHER): Payer: Medicare Other | Admitting: Orthopedic Surgery

## 2014-11-05 ENCOUNTER — Encounter: Payer: Self-pay | Admitting: Orthopedic Surgery

## 2014-11-05 VITALS — BP 154/84 | Ht 64.0 in | Wt 200.0 lb

## 2014-11-05 DIAGNOSIS — M1120 Other chondrocalcinosis, unspecified site: Secondary | ICD-10-CM

## 2014-11-05 DIAGNOSIS — M25561 Pain in right knee: Secondary | ICD-10-CM

## 2014-11-05 DIAGNOSIS — M25461 Effusion, right knee: Secondary | ICD-10-CM | POA: Diagnosis not present

## 2014-11-05 DIAGNOSIS — M1711 Unilateral primary osteoarthritis, right knee: Secondary | ICD-10-CM

## 2014-11-05 DIAGNOSIS — I639 Cerebral infarction, unspecified: Secondary | ICD-10-CM

## 2014-11-05 NOTE — Progress Notes (Signed)
Patient ID: Virginia [name], female   DOB: 1946-09-24, 68 y.o.   MRN: [id number]    Patient ID: Virginia [name], female   DOB: Jan 06, 1946, 68 y.o.   MRN: [id number]  Chief Complaint  Patient presents with  . Knee Pain    Right knee pain, no injury.    HPI Virginia [name] is a 68 y.o. female.  68 years old presents with 3 week history of pain and swelling giving out symptoms in her right knee no previous history of major knee problems. She complains of pressure sensation with some giving way symptoms aching and throbbing currently 2 out of 10 at its worst 7 out of 10 and did require her to use a crutch  It seems like the heat in the Aleve have relieved her symptoms 95% although she pressure when flexing her knee   Review of Systems Review of Systems 1. Review of systems joint pain stiff joints swollen joints denies back pain has a history of depression ankle swelling slight otherwise review of systems normal     Past Medical History  Diagnosis Date  . Hypothyroidism   . Anxiety   . Depression   . Osteoporosis   . Sleep apnea   . HTN (hypertension)     resolved with weight loss s/p gastric bypass  . Stroke   . RLS (restless legs syndrome)     Past Surgical History  Procedure Laterality Date  . Gastric roux-en-y  2006  . Cholecystectomy    . Esophagogastroduodenoscopy  2004    small hiatal hernia  . Colonoscopy  2004    internal hemorrhoids, left-sided diverticulae, splenic flexure polyp (63mm), path unavailable at this time    Social History History  Substance Use Topics  . Smoking status: Never Smoker   . Smokeless tobacco: Not on file  . Alcohol Use: No    Allergies  Allergen Reactions  . Amoxicillin-Pot Clavulanate Hives and Rash    Current Outpatient Prescriptions  Medication Sig Dispense Refill  . ALPRAZolam (XANAX) 0.5 MG tablet Take 0.5 mg by mouth daily as needed. For anxiety and/or sleep    . aspirin 81 MG tablet Take 81 mg by mouth daily.     . Calcium Carbonate-Vit D-Min (CALCIUM 1200 PO) Take 1,200 mg by mouth daily.    . Cholecalciferol (VITAMIN D-3) 1000 UNITS CAPS Take 1,000 Units by mouth daily.    . Cyanocobalamin (VITAMIN B-12 PO) Take 1 tablet by mouth every morning.    . DULoxetine (CYMBALTA) 60 MG capsule Take 60 mg by mouth daily.    Marland Kitchen levothyroxine (SYNTHROID, LEVOTHROID) 100 MCG tablet Take 100 mcg by mouth daily before breakfast.    . Multiple Vitamin (MULTIVITAMIN WITH MINERALS) TABS Take 1 tablet by mouth every morning.    Marland Kitchen rOPINIRole (REQUIP) 0.25 MG tablet Take 1 tablet (0.25 mg total) by mouth at bedtime. 90 tablet 3  . simvastatin (ZOCOR) 40 MG tablet TAKE 1 TABLET (40 MG TOTAL) BY MOUTH DAILY AT 6 PM. 90 tablet 3   No current facility-administered medications for this visit.      Physical Exam Physical Exam Blood pressure 154/84, height 5\' 4"  (1.626 m), weight 200 lb (90.719 kg).  Gen. appearance normal grooming normal hygiene The patient is alert and oriented person place and time Mood is normal affect is normal Ambulatory status do not detect a limp today  Exam of the right knee  Inspection moderate joint effusion with medial joint line tenderness McMurray sign  is positive ROM full passive range of motion Stability all ligaments were tested and are stable Strength muscle tone and strength remain normal  Skin: Skin is intact  Pulses: Normal distal pulses  Neuro: No sensory deficits  Left knee no effusion   Data Reviewed X-ray interpretation: Symmetric osteoarthritis with chondrocalcinosis right knee  Assessment    Chondrocalcinosis with osteoarthritis joint effusion and possible medial meniscal tear    Plan    Aspiration injection Return in 4 weeks from now  continue ice and Aleve-  Procedure note injection and aspiration right knee joint  Verbal consent was obtained to aspirate and inject the right knee joint   Timeout was completed to confirm the site of aspiration and  injection  An 18-gauge needle was used to aspirate the knee joint from a suprapatellar lateral approach.  The medications used were 40 mg of Depo-Medrol and 1% lidocaine 3 cc  Anesthesia was provided by ethyl chloride and the skin was prepped with alcohol.  After cleaning the skin with alcohol an 18-gauge needle was used to aspirate the right knee joint.  We obtained 20  cc of fluid  We follow this by injection of 40 mg of Depo-Medrol and 3 cc 1% lidocaine.  There were no complications. A sterile bandage was applied.          Arther Abbott 11/05/2014, 11:42 AM

## 2014-11-05 NOTE — Patient Instructions (Signed)
Ice 2-3 times daily 20-30 minutes Aleve

## 2014-12-02 ENCOUNTER — Ambulatory Visit (INDEPENDENT_AMBULATORY_CARE_PROVIDER_SITE_OTHER): Payer: Medicare Other | Admitting: Neurology

## 2014-12-02 ENCOUNTER — Encounter: Payer: Self-pay | Admitting: Neurology

## 2014-12-02 ENCOUNTER — Ambulatory Visit: Payer: Medicare Other | Admitting: Nurse Practitioner

## 2014-12-02 VITALS — BP 149/87 | HR 77 | Resp 14 | Ht 64.0 in | Wt 200.0 lb

## 2014-12-02 DIAGNOSIS — G2581 Restless legs syndrome: Secondary | ICD-10-CM

## 2014-12-02 DIAGNOSIS — I639 Cerebral infarction, unspecified: Secondary | ICD-10-CM

## 2014-12-02 DIAGNOSIS — I63031 Cerebral infarction due to thrombosis of right carotid artery: Secondary | ICD-10-CM | POA: Diagnosis not present

## 2014-12-02 DIAGNOSIS — G4733 Obstructive sleep apnea (adult) (pediatric): Secondary | ICD-10-CM

## 2014-12-02 DIAGNOSIS — Z9989 Dependence on other enabling machines and devices: Secondary | ICD-10-CM

## 2014-12-02 MED ORDER — SIMVASTATIN 40 MG PO TABS: ORAL_TABLET | ORAL | Status: DC

## 2014-12-02 MED ORDER — ROPINIROLE HCL 0.25 MG PO TABS: 0.2500 mg | ORAL_TABLET | Freq: Every day | ORAL | Status: DC

## 2014-12-02 NOTE — Progress Notes (Signed)
[facility] Sleep clinic visit   Provider:  Larey [name], M D  Referring Provider: Redmond School, MD Primary Care Physician:  Virginia [name]., MD  Chief Complaint  Patient presents with  . RV sleep cpap    Rm 10, alone    HPI:  Virginia [name] is a 69 y.o. female ,  seen here as a revisit  from [place], who sees the patient for Stroke since August 2nd 2013 .   She has a CPAP for OSA treatment and is seen here for Compliance.  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 She is now on Medicare , and has needed to get new CPAP supplies.    In 2013 she developed left-sided hemiparesis involving face and arm predominantly. The left side was affected. She has returned to the current , normal level of function . In  October  2013 , the patient was tested for sleep apnea followed by a CPAP titration. PolySomnogram was ordered by Dr Virginia [name] to check for secondary risk for stroke . The patient had previously been diagnosed with sleep apnea in 2006 , but at that time the RDI was 19 / hour and she had PLM related arousals  more frequently than apnea related events.  At that time,  she was also considered morbidly obese and a candidate for gastric  bypass surgery.  After she underwent weight loss surgery a weight loss of 100 pounds resulted . This also seemed to have resolved obstructive sleep apnea at least for a period of time , but over the years she had regained about 50 pounds and she noted a return of the symptoms , which brought her back to the sleep lab.  On 08-11-12  She underwent a diagnostic polysomnogram,  which documented an AHI of 17.9  and on 09-15-12 she was titrated successfully to CPAP and optimal titration device was ordered for the patient it seemed that she responded best to very low pressure such as 6 cm water.  A compliance download was obtained 2015 for a whole year. Her twelve-month download short periods during which the CPAP could not be  used.  The gap in 12/28/2012 resulted from a fall with a fracture of the  right wrist, the patient was unable to use the headgear and actually slept in a recliner.  The patient lives with her psychiatrically ill daughter , who has ADHD, OCD claustrophobia and major depression. The second time period in October  2015 resulted from a psychiatric break down after her brother's wedding party. [employer].  Her daughter's daughter is 88 and also living with them. The patient was under a lot of stress and finally her daughter was committed.  She is her daughter's (15 y.o.)  main caretaker.   2015 CPAP compliance : The patient fulfills the CMS requirement , AHI is 3.0, and auto -titrator is not restricted- the pressure window is between 4 and 20 CM water?  72% compliance over 12 month . 95% percentile pressure is 9.6 cm water.  In the last 90 days, her compliance was over 85% . 97% CPAP compliance in 2013.    Mrs.   [name] brought me her CPAP machine download today it is dated 12-01-14 and encompassed the last 30 days. The patient is a high compliance 93% of days of use and every single day all for 4 hours. The average time of use nightly is 8 hours 8 minutes the patient uses an auto Pap the minimum  pressure is 5 and the maximum pressure 10 cm water at the 95% percentile she is at 9.7 cm water her AHI is 4.1 reviewing the grafts I noticed that the AHI is not that highly variable day by day and that the airleak doesn't play a role. In the 90 day download her AHI was 3.6 her compliance 93% again -every night over 4 hours 8 hours and 12 minutes on average, between 5 and 10 cm water out of  95% percentile here as well 9.7 cm water. She would like a little more air to start and a shorter RAMP , starting at 5 cm , The patient denies any discomfort with the mask fit, she does not swallow air or is air hungry, and she does not complain of any condensation water accumulating. Her Epworth sleepiness score is 6 points and her  fatigue score 28 points. When I met her last year she was very depressed and I think she is now having a more happy outlook again.     Review of Systems: Out of a complete 14 system review, the patient complains of only the following symptoms, and all other reviewed systems are negative. Patient endorsed GDS at 2 points, FSS 28 Epworth 6    [facility] is DME , [place].   History   Social History  . Marital Status: Divorced    Spouse Name: N/A    Number of Children: 1  . Years of Education: college   Occupational History  . retired    .     Social History Main Topics  . Smoking status: Former Smoker    Quit date: 11/28/1987  . Smokeless tobacco: Not on file  . Alcohol Use: No  . Drug Use: No  . Sexual Activity: Not on file   Other Topics Concern  . Not on file   Social History Narrative   Caffeine 6 cups daily.  Retired/[employer],   2 yrs college,  Divorced, one daughter.    Family History  Problem Relation Age of Onset  . Cancer      family history   . Coronary artery disease      family history   . Arthritis      family history   . Lung cancer Father   . Stroke Father   . Stroke Mother   . Stroke Brother   . Stroke Maternal Grandmother   . Stroke Maternal Grandfather     Past Medical History  Diagnosis Date  . Hypothyroidism   . Anxiety   . Depression   . Osteoporosis   . Sleep apnea   . HTN (hypertension)     resolved with weight loss s/p gastric bypass  . Stroke   . RLS (restless legs syndrome)     Past Surgical History  Procedure Laterality Date  . Gastric roux-en-y  2006  . Cholecystectomy    . Esophagogastroduodenoscopy  2004    small hiatal hernia  . Colonoscopy  2004    internal hemorrhoids, left-sided diverticulae, splenic flexure polyp (7m), path unavailable at this time    Current Outpatient Prescriptions  Medication Sig Dispense Refill  . ALPRAZolam (XANAX) 0.5 MG tablet Take 0.5 mg by mouth daily as needed. For anxiety and/or  sleep    . aspirin 81 MG tablet Take 81 mg by mouth daily.    . Calcium Carbonate-Vit D-Min (CALCIUM 1200 PO) Take 1,200 mg by mouth daily.    . Cyanocobalamin (VITAMIN B-12 PO) Take 1 tablet by  mouth every morning.    . DULoxetine (CYMBALTA) 60 MG capsule Take 60 mg by mouth daily.    Marland Kitchen levothyroxine (SYNTHROID, LEVOTHROID) 100 MCG tablet Take 100 mcg by mouth daily before breakfast. BRAND.    . Multiple Vitamin (MULTIVITAMIN WITH MINERALS) TABS Take 1 tablet by mouth every morning.    Marland Kitchen rOPINIRole (REQUIP) 0.25 MG tablet Take 1 tablet (0.25 mg total) by mouth at bedtime. 90 tablet 3  . Cholecalciferol (VITAMIN D-3) 1000 UNITS CAPS Take 1,000 Units by mouth daily.    . simvastatin (ZOCOR) 40 MG tablet TAKE 1 TABLET (40 MG TOTAL) BY MOUTH DAILY AT 6 PM. (Patient taking differently: TAKE 1 TABLET (40 MG TOTAL) BY MOUTH DAILY AT 6 PM. (takes 1/2 tablet daily)) 90 tablet 3  . SYNTHROID 112 MCG tablet   3   No current facility-administered medications for this visit.    Allergies as of 12/02/2014 - Review Complete 12/02/2014  Allergen Reaction Noted  . Amoxicillin-pot clavulanate Hives and Rash     Vitals: BP 149/87 mmHg  Pulse 77  Resp 14  Ht '5\' 4"'  (1.626 m)  Wt 200 lb (90.719 kg)  BMI 34.31 kg/m2 Last Weight:  Wt Readings from Last 1 Encounters:  12/02/14 200 lb (90.719 kg)   Last Height:   Ht Readings from Last 1 Encounters:  12/02/14 '5\' 4"'  (1.626 m)    Physical exam:  General: The patient is awake, alert and appears not in acute distress. The patient is well groomed. Head: Normocephalic, atraumatic. Neck is supple. Mallampati 3  neck circumference: 15.5  Cardiovascular:  Regular rate and rhythm, without  murmurs or carotid bruit, and without distended neck veins. Respiratory: Lungs are clear to auscultation. Skin:  Without evidence of edema, or rash Trunk: BMI is elevated , normal posture.  Neurologic exam : The patient is awake and alert, oriented to place and time.   Memory subjective described as intact.  There is a normal attention span & concentration ability. Speech is fluent without dysarthria, dysphonia or aphasia. Mood and affect are depressed .  Cranial nerves: Pupils are equal and briskly reactive to light. Funduscopic exam without  evidence of pallor or edema.  Extraocular movements  in vertical and horizontal planes intact and without nystagmus. Visual fields by finger perimetry are intact. Hearing to finger rub intact.  Facial sensation intact to fine touch. Facial motor strength is symmetric and tongue and uvula move midline.  Motor exam:    The patient has symmetric muscle tone and symmetric muscle mass between left and right body, there is a slight weakness of the hip flexor and a milder weakness of the supinator. Sensory:  Fine touch, pinprick and vibration were tested in all extremities. Proprioception normal.  Coordination: Rapid alternating movements in the fingers/hands is tested and normal, without evidence of ataxia, dysmetria or tremor.  Gait and station: Patient walks without assistive device .  Deep tendon reflexes: in the  upper and lower extremities are symmetric and intact.     Assessment:  After physical and neurologic examination, review of laboratory studies, imaging, neurophysiology testing and pre-existing records, assessment is   1) Patient with mild to moderate OSA , history of CVA, still obese - but not morbidly .  CPAP auto-titrator allows for residual AHI of 3.9 , sufficient control.95% percentile here as well 9.7 cm water. She would like a little more air to start and a shorter RAMP , starting at 5 cm ,  The patient denies any  discomfort with the mask fit, she does not swallow air or is air hungry, and she does not complain of any condensation water accumulating.   2) Her Epworth sleepiness score is 6 points and her fatigue score 28 points.  When I met her last year she was very depressed and I think she is now  having a more happy outlook again. Depression resolved.  3)RLS - on Requip with some anticipation.   4) Zocor for secondary stroke prevention.   Plan:  Treatment plan and additional workup : continue CPAP use, I would like to start at 5 cm , RAMP time needs to be shorter - 97% compliance by  Medicare guidelines . New DME in [place]. Marland Kitchen

## 2014-12-03 ENCOUNTER — Ambulatory Visit (INDEPENDENT_AMBULATORY_CARE_PROVIDER_SITE_OTHER): Payer: Medicare Other | Admitting: Orthopedic Surgery

## 2014-12-03 ENCOUNTER — Encounter: Payer: Self-pay | Admitting: Orthopedic Surgery

## 2014-12-03 VITALS — BP 141/73 | Ht 64.0 in | Wt 200.0 lb

## 2014-12-03 DIAGNOSIS — I639 Cerebral infarction, unspecified: Secondary | ICD-10-CM

## 2014-12-03 DIAGNOSIS — M25561 Pain in right knee: Secondary | ICD-10-CM | POA: Diagnosis not present

## 2014-12-03 DIAGNOSIS — M25461 Effusion, right knee: Secondary | ICD-10-CM

## 2014-12-03 DIAGNOSIS — M1711 Unilateral primary osteoarthritis, right knee: Secondary | ICD-10-CM | POA: Diagnosis not present

## 2014-12-03 DIAGNOSIS — M1120 Other chondrocalcinosis, unspecified site: Secondary | ICD-10-CM

## 2014-12-03 NOTE — Progress Notes (Signed)
Patient ID: Virginia [name], female   DOB: [birth year], 69 y.o.   MRN: [id number] Encounter Diagnoses  Name Primary?  . Right knee pain Yes  . Effusion of knee joint, right   . Primary osteoarthritis of right knee   . Chondrocalcinosis     Chief Complaint  Patient presents with  . Follow-up    4 week follow up Rt knee s/p injection    Follow-up status post aspiration injection right knee approximately one month ago patient also had chondrocalcinosis with osteoarthritis of the right knee. She says her knee pain is better perfusion has returned. Her pain is not completely resolved  No catching locking or giving way since we aspirated her last time  Review of systems negative for numbness or tingling in the right leg. Exam shows a palpable joint effusion mild tenderness medial joint line range of motion 130. Knee stable motor exam normal skin intact good pulses normal sensation she is alert and oriented 3 mood and affect normal.  Aspirated and a 30 mL injected it. See report. Follow-up 4 weeks.  Procedure note injection and aspiration right knee joint  Verbal consent was obtained to aspirate and inject the right knee joint   Timeout was completed to confirm the site of aspiration and injection  An 18-gauge needle was used to aspirate the knee joint from a suprapatellar lateral approach.  The medications used were 40 mg of Depo-Medrol and 1% lidocaine 3 cc  Anesthesia was provided by ethyl chloride and the skin was prepped with alcohol.  After cleaning the skin with alcohol an 18-gauge needle was used to aspirate the right knee joint.  We obtained 30  cc of fluid  We follow this by injection of 40 mg of Depo-Medrol and 3 cc 1% lidocaine.  There were no complications. A sterile bandage was applied.

## 2014-12-31 ENCOUNTER — Ambulatory Visit (INDEPENDENT_AMBULATORY_CARE_PROVIDER_SITE_OTHER): Payer: Medicare Other | Admitting: Orthopedic Surgery

## 2014-12-31 ENCOUNTER — Encounter: Payer: Self-pay | Admitting: Orthopedic Surgery

## 2014-12-31 VITALS — BP 136/71 | Ht 64.0 in | Wt 200.0 lb

## 2014-12-31 DIAGNOSIS — I639 Cerebral infarction, unspecified: Secondary | ICD-10-CM | POA: Diagnosis not present

## 2014-12-31 DIAGNOSIS — M1711 Unilateral primary osteoarthritis, right knee: Secondary | ICD-10-CM

## 2014-12-31 NOTE — Progress Notes (Signed)
Chief Complaint  Patient presents with  . Follow-up    4 week recheck Right Knee    The patient says that she was going to cancel this visit her knee feels fine. She has free and easy range of motion no swelling  Follow-up as needed  Arthritis pain with effusion right knee resolved.

## 2015-02-25 ENCOUNTER — Other Ambulatory Visit (HOSPITAL_COMMUNITY): Payer: Self-pay | Admitting: Internal Medicine

## 2015-02-25 DIAGNOSIS — Z6836 Body mass index (BMI) 36.0-36.9, adult: Secondary | ICD-10-CM | POA: Diagnosis not present

## 2015-02-25 DIAGNOSIS — Z23 Encounter for immunization: Secondary | ICD-10-CM | POA: Diagnosis not present

## 2015-02-25 DIAGNOSIS — K921 Melena: Secondary | ICD-10-CM

## 2015-02-25 DIAGNOSIS — R109 Unspecified abdominal pain: Secondary | ICD-10-CM | POA: Diagnosis not present

## 2015-02-25 DIAGNOSIS — R7309 Other abnormal glucose: Secondary | ICD-10-CM | POA: Diagnosis not present

## 2015-02-25 DIAGNOSIS — F419 Anxiety disorder, unspecified: Secondary | ICD-10-CM | POA: Diagnosis not present

## 2015-02-25 DIAGNOSIS — E063 Autoimmune thyroiditis: Secondary | ICD-10-CM | POA: Diagnosis not present

## 2015-02-25 DIAGNOSIS — R1013 Epigastric pain: Secondary | ICD-10-CM | POA: Diagnosis not present

## 2015-02-25 DIAGNOSIS — E6609 Other obesity due to excess calories: Secondary | ICD-10-CM | POA: Diagnosis not present

## 2015-02-25 DIAGNOSIS — I10 Essential (primary) hypertension: Secondary | ICD-10-CM | POA: Diagnosis not present

## 2015-02-25 DIAGNOSIS — E781 Pure hyperglyceridemia: Secondary | ICD-10-CM | POA: Diagnosis not present

## 2015-03-01 ENCOUNTER — Ambulatory Visit (HOSPITAL_COMMUNITY)
Admission: RE | Admit: 2015-03-01 | Discharge: 2015-03-01 | Disposition: A | Discharge status: Home / Self Care | Payer: Medicare Other | Source: Ambulatory Visit | Attending: Internal Medicine | Admitting: Internal Medicine

## 2015-03-01 DIAGNOSIS — K760 Fatty (change of) liver, not elsewhere classified: Secondary | ICD-10-CM | POA: Diagnosis not present

## 2015-03-01 DIAGNOSIS — R1013 Epigastric pain: Secondary | ICD-10-CM | POA: Insufficient documentation

## 2015-03-01 DIAGNOSIS — R109 Unspecified abdominal pain: Secondary | ICD-10-CM

## 2015-03-01 DIAGNOSIS — K921 Melena: Secondary | ICD-10-CM

## 2015-03-01 LAB — POCT I-STAT CREATININE: Creatinine, Ser: 0.8 mg/dL (ref 0.50–1.10)

## 2015-03-01 MED ORDER — IOHEXOL 300 MG/ML  SOLN
100.0000 mL | Freq: Once | INTRAMUSCULAR | Status: AC | PRN
  Administered 2015-03-01: 100 mL via INTRAVENOUS

## 2015-03-09 DIAGNOSIS — K859 Acute pancreatitis, unspecified: Secondary | ICD-10-CM | POA: Diagnosis not present

## 2015-03-09 DIAGNOSIS — R1013 Epigastric pain: Secondary | ICD-10-CM | POA: Diagnosis not present

## 2015-03-22 DIAGNOSIS — H43812 Vitreous degeneration, left eye: Secondary | ICD-10-CM | POA: Diagnosis not present

## 2015-05-17 ENCOUNTER — Other Ambulatory Visit (HOSPITAL_COMMUNITY): Payer: Self-pay | Admitting: Internal Medicine

## 2015-05-17 DIAGNOSIS — Z1231 Encounter for screening mammogram for malignant neoplasm of breast: Secondary | ICD-10-CM

## 2015-05-24 ENCOUNTER — Other Ambulatory Visit: Payer: Self-pay

## 2015-05-26 ENCOUNTER — Ambulatory Visit (HOSPITAL_COMMUNITY)
Admission: RE | Admit: 2015-05-26 | Discharge: 2015-05-26 | Disposition: A | Discharge status: Home / Self Care | Payer: Medicare Other | Source: Ambulatory Visit | Attending: Internal Medicine | Admitting: Internal Medicine

## 2015-05-26 DIAGNOSIS — Z1231 Encounter for screening mammogram for malignant neoplasm of breast: Secondary | ICD-10-CM

## 2015-05-28 ENCOUNTER — Other Ambulatory Visit (HOSPITAL_COMMUNITY): Payer: Self-pay | Admitting: Internal Medicine

## 2015-05-28 DIAGNOSIS — N63 Unspecified lump in unspecified breast: Secondary | ICD-10-CM

## 2015-06-01 ENCOUNTER — Ambulatory Visit (HOSPITAL_COMMUNITY): Payer: Medicare Other

## 2015-06-08 ENCOUNTER — Other Ambulatory Visit: Payer: Self-pay | Admitting: Internal Medicine

## 2015-06-08 ENCOUNTER — Ambulatory Visit (HOSPITAL_COMMUNITY)
Admission: RE | Admit: 2015-06-08 | Discharge: 2015-06-08 | Disposition: A | Discharge status: Home / Self Care | Payer: Medicare Other | Source: Ambulatory Visit | Attending: Internal Medicine | Admitting: Internal Medicine

## 2015-06-08 DIAGNOSIS — N63 Unspecified lump in unspecified breast: Secondary | ICD-10-CM

## 2015-06-11 ENCOUNTER — Ambulatory Visit
Admission: RE | Admit: 2015-06-11 | Discharge: 2015-06-11 | Disposition: A | Discharge status: Home / Self Care | Payer: Medicare Other | Source: Ambulatory Visit | Attending: Internal Medicine | Admitting: Internal Medicine

## 2015-06-11 ENCOUNTER — Other Ambulatory Visit: Payer: Self-pay | Admitting: Internal Medicine

## 2015-06-11 DIAGNOSIS — N63 Unspecified lump in unspecified breast: Secondary | ICD-10-CM

## 2015-06-11 DIAGNOSIS — N6489 Other specified disorders of breast: Secondary | ICD-10-CM | POA: Diagnosis not present

## 2015-06-21 DIAGNOSIS — R7309 Other abnormal glucose: Secondary | ICD-10-CM | POA: Diagnosis not present

## 2015-09-17 DIAGNOSIS — M1991 Primary osteoarthritis, unspecified site: Secondary | ICD-10-CM | POA: Diagnosis not present

## 2015-09-17 DIAGNOSIS — Z1389 Encounter for screening for other disorder: Secondary | ICD-10-CM | POA: Diagnosis not present

## 2015-09-17 DIAGNOSIS — Z23 Encounter for immunization: Secondary | ICD-10-CM | POA: Diagnosis not present

## 2015-09-17 DIAGNOSIS — Z6833 Body mass index (BMI) 33.0-33.9, adult: Secondary | ICD-10-CM | POA: Diagnosis not present

## 2015-09-17 DIAGNOSIS — E063 Autoimmune thyroiditis: Secondary | ICD-10-CM | POA: Diagnosis not present

## 2015-09-17 DIAGNOSIS — E781 Pure hyperglyceridemia: Secondary | ICD-10-CM | POA: Diagnosis not present

## 2015-09-17 DIAGNOSIS — R7309 Other abnormal glucose: Secondary | ICD-10-CM | POA: Diagnosis not present

## 2015-12-05 ENCOUNTER — Telehealth: Payer: Self-pay

## 2015-12-05 NOTE — Telephone Encounter (Signed)
Spoke to pt. I advised her that our office will be closed tomorrow, 12/06/15 and we will need to reschedule her appt. Pt is agreeable to coming in on 12/16/15 at 2:30. Pt verbalized understanding.

## 2015-12-06 ENCOUNTER — Ambulatory Visit: Payer: Medicare Other | Admitting: Neurology

## 2015-12-16 ENCOUNTER — Ambulatory Visit (INDEPENDENT_AMBULATORY_CARE_PROVIDER_SITE_OTHER): Payer: Medicare Other | Admitting: Neurology

## 2015-12-16 ENCOUNTER — Encounter: Payer: Self-pay | Admitting: Neurology

## 2015-12-16 VITALS — BP 178/78 | HR 88 | Resp 20 | Ht 64.0 in | Wt 193.0 lb

## 2015-12-16 DIAGNOSIS — G4733 Obstructive sleep apnea (adult) (pediatric): Secondary | ICD-10-CM | POA: Diagnosis not present

## 2015-12-16 DIAGNOSIS — G2581 Restless legs syndrome: Secondary | ICD-10-CM | POA: Diagnosis not present

## 2015-12-16 DIAGNOSIS — I63311 Cerebral infarction due to thrombosis of right middle cerebral artery: Secondary | ICD-10-CM

## 2015-12-16 DIAGNOSIS — Z9989 Dependence on other enabling machines and devices: Principal | ICD-10-CM

## 2015-12-16 MED ORDER — ROPINIROLE HCL 0.25 MG PO TABS
0.2500 mg | ORAL_TABLET | Freq: Every day | ORAL | Status: DC
Start: 2015-12-16 — End: 2016-12-22

## 2015-12-16 NOTE — Progress Notes (Signed)
[facility] Sleep clinic visit   Provider:  Larey Seat, M D  Referring Provider: Redmond School, MD Primary Care Physician:  [name], MD  Chief Complaint  Patient presents with  . Follow-up    cpap follow up, going well, no problems, rm 11, alone    HPI:  Virginia [name] is a 70 y.o. female ,  seen here as a revisit  from [name], who sees the patient for Stroke since August 2nd 2013 .   She has a CPAP for OSA treatment and is seen here for Compliance.  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  In 2013 she developed left-sided hemiparesis involving face and arm predominantly. The left side was affected. She has returned to the current , normal level of function . In  October  2013 , the patient was tested for sleep apnea followed by a CPAP titration. PolySomnogram was ordered by Dr Jannifer Franklin to check for secondary risk for stroke . The patient had previously been diagnosed with sleep apnea in 2006 , but at that time the RDI was 19 / hour and she had PLM related arousals  more frequently than apnea related events.  At that time,  she was also considered morbidly obese and a candidate for gastric  bypass surgery.  After she underwent weight loss surgery a weight loss of 100 pounds resulted . This also seemed to have resolved obstructive sleep apnea at least for a period of time , but over the years she had regained about 50 pounds and she noted a return of the symptoms , which brought her back to the sleep lab.  On 08-11-12  She underwent a diagnostic polysomnogram,  which documented an AHI of 17.9  and on 09-15-12 she was titrated successfully to CPAP and optimal titration device was ordered for the patient it seemed that she responded best to very low pressure such as 6 cm water.  A compliance download was obtained 2015 for a whole year. Her twelve-month download short periods  during which the CPAP could not be used. The gap in 12/28/2012 resulted from a fall with a fracture of the  right wrist, the patient was unable to use the headgear and actually slept in a recliner. The patient lives with her psychiatrically ill daughter , who has ADHD, OCD claustrophobia and major depression. The second time period in October  2015 resulted from a psychiatric break down after her brother's wedding party. Her daughter's daughter is 54 and also living with them. The patient was under a lot of stress and finally her daughter was committed.  She is her daughter's (70 y.o.)  main caretaker.   2015 CPAP compliance : The patient fulfills the CMS requirement , AHI is 3.0, and auto -titrator is not restricted- the pressure window is between 4 and 20 CM water?  72% compliance over 12 month . 95% percentile pressure is 9.6 cm water.  In the last 90 days, her compliance was over 85% . 97% CPAP compliance in 2013.   [name] brought me her CPAP machine , dated 12-01-14 and encompassed the last 30 days.  The patient has a high compliance 93% of days of use and every single day all for 4 hours. The average time of use nightly is 8 hours 8 minutes the patient uses an auto Pap the minimum pressure is 5 and the maximum  pressure 10 cm water at the 95% percentile she is at 9.7 cm water her AHI is 4.1 reviewing the grafts I noticed that the AHI is not that highly variable day by day and that the airleak doesn't play a role. In the 90 day download her AHI was 3.6 her compliance 93% again -every night over 4 hours 8 hours and 12 minutes on average, between 5 and 10 cm water out of  95% percentile here as well 9.7 cm water. She would like a little more air to start and a shorter RAMP , starting at 5 cm , The patient denies any discomfort with the mask fit, she does not swallow air or is air hungry, and she does not complain of any condensation water accumulating. Her Epworth sleepiness score is 6 points and her  fatigue score 28 points. When I met her last year she was very depressed and I think she is now having a more happy outlook again.  History from 12/16/2015 [name] is here today for her regular yearly CPAP compliance visit. She is using an old machine, anEscape by [employer]	[place],. She has been on this machine for 3-1/2 years. The patient shows a 90% compliance on today's download for over 4 hours of daily use average user time 6 hours 52 minutes, AHI is 3.2 which is in a good resolution. She uses a minimum pressure of 5 and a maximum pressure of 10 cm water the 91st percentile is 9.6.  The patient has endorsed the geriatric depression score at one point, the fatigue severity score at 17 and the Epworth sleepiness score at 7 points. She lost 8 pounds over the last 12 month. She reported having higher glucose levels. HbcA1c score reportedly rose to 5.9. She needs no medication, diet controlled. She believes that the Zocor may have negatively affected her blood sugar levels as well as her memory. I'm not aware of the diabetic side effects but I am well aware of possible memory side effects as well as myopathy.    Review of Systems: Out of a complete 14 system review, the patient complains of only the following symptoms, and all other reviewed systems are negative. Patient endorsed GDS at 2 points,  FSS 17 Epworth 7   [facility] is DME , [place].   Social History   Social History  . Marital Status: Divorced    Spouse Name: N/A  . Number of Children: 1  . Years of Education: college   Occupational History  . retired    .     Social History Main Topics  . Smoking status: Former Smoker    Quit date: 11/28/1987  . Smokeless tobacco: Not on file  . Alcohol Use: No  . Drug Use: No  . Sexual Activity: Not on file   Other Topics Concern  . Not on file   Social History Narrative   Caffeine 6 cups daily.  Retired/[employer],   2 yrs college,  Divorced, one daughter.    Family History  Problem  Relation Age of Onset  . Cancer      family history   . Coronary artery disease      family history   . Arthritis      family history   . Lung cancer Father   . Stroke Father   . Stroke Mother   . Stroke Brother   . Stroke Maternal Grandmother   . Stroke Maternal Grandfather     Past Medical History  Diagnosis Date  .  Hypothyroidism   . Anxiety   . Depression   . Osteoporosis   . Sleep apnea   . HTN (hypertension)     resolved with weight loss s/p gastric bypass  . Stroke ([place])   . RLS (restless legs syndrome)     Past Surgical History  Procedure Laterality Date  . Gastric roux-en-y  2006  . Cholecystectomy    . Esophagogastroduodenoscopy  2004    small hiatal hernia  . Colonoscopy  2004    internal hemorrhoids, left-sided diverticulae, splenic flexure polyp (63m), path unavailable at this time    Current Outpatient Prescriptions  Medication Sig Dispense Refill  . ALPRAZolam (XANAX) 0.5 MG tablet Take 0.5 mg by mouth daily as needed. For anxiety and/or sleep    . aspirin 81 MG tablet Take 81 mg by mouth daily.    . Calcium Carbonate-Vit D-Min (CALCIUM 1200 PO) Take 1,200 mg by mouth daily.    . Cholecalciferol (VITAMIN D-3) 1000 UNITS CAPS Take 1,000 Units by mouth daily.    . Cyanocobalamin (VITAMIN B-12 PO) Take 1 tablet by mouth every morning.    . DULoxetine (CYMBALTA) 30 MG capsule Take 30 mg by mouth daily.  3  . DULoxetine (CYMBALTA) 60 MG capsule Take 60 mg by mouth daily.    .Marland Kitchenlevothyroxine (SYNTHROID, LEVOTHROID) 100 MCG tablet Take 100 mcg by mouth daily before breakfast.    . Multiple Vitamin (MULTIVITAMIN WITH MINERALS) TABS Take 1 tablet by mouth every morning.    .Marland KitchenrOPINIRole (REQUIP) 0.25 MG tablet Take 1 tablet (0.25 mg total) by mouth at bedtime. 90 tablet 3  . simvastatin (ZOCOR) 40 MG tablet TAKE 1 TABLET (40 MG TOTAL) BY MOUTH DAILY AT 6 PM. (Patient not taking: Reported on 12/16/2015) 90 tablet 3   No current facility-administered medications  for this visit.    Allergies as of 12/16/2015 - Review Complete 12/16/2015  Allergen Reaction Noted  . Amoxicillin-pot clavulanate Hives and Rash     Vitals: BP 178/78 mmHg  Pulse 88  Resp 20  Ht '5\' 4"'  (1.626 m)  Wt 193 lb (87.544 kg)  BMI 33.11 kg/m2 Last Weight:  Wt Readings from Last 1 Encounters:  12/16/15 193 lb (87.544 kg)   Last Height:   Ht Readings from Last 1 Encounters:  12/16/15 '5\' 4"'  (1.626 m)    Physical exam:  General: The patient is awake, alert and appears not in acute distress. The patient is well groomed. Head: Normocephalic, atraumatic. Neck is supple. Mallampati 3  neck circumference: 15.5  Cardiovascular:  Regular rate and rhythm, without  murmurs or carotid bruit, and without distended neck veins. Respiratory: Lungs are clear to auscultation. Skin:  Without evidence of edema, or rash Trunk: BMI is elevated , normal posture.  Neurologic exam : The patient is awake and alert, oriented to place and time.  Memory subjective described as intact.  There is a normal attention span & concentration ability. Speech is fluent without dysarthria, dysphonia or aphasia. Mood and affect are depressed .  Cranial nerves: Pupils are equal and briskly reactive to light. Funduscopic exam without  evidence of pallor or edema.  Extraocular movements  in vertical and horizontal planes intact and without nystagmus. Visual fields by finger perimetry are intact. Hearing to finger rub intact.  Facial sensation intact to fine touch. Facial motor strength is symmetric and tongue and uvula move midline.  Motor exam:    The patient has symmetric muscle tone and symmetric muscle mass between left  and right body, there is a slight weakness of the hip flexor and a milder weakness of the supinator. Sensory:  Fine touch, pinprick and vibration were tested in all extremities. Proprioception normal.  Coordination: Rapid alternating movements in the fingers/hands is tested and normal,  without evidence of ataxia, dysmetria or tremor.  Gait and station: Patient walks without assistive device .  Deep tendon reflexes: in the  upper and lower extremities are symmetric and intact.     Assessment:  After physical and neurologic examination, review of laboratory studies, imaging, neurophysiology testing and pre-existing records, assessment is   1) Patient with mild to moderate OSA , history of CVA, still obese - but not morbidly .  CPAP auto-titrator allows for residual AHI of 3.2 , sufficient control. 95% percentile here as well 9.6 cm water. She would like a little more air to start and a shorter RAMP , starting at 5 cm ,  The patient denies any discomfort with the mask fit, she does not swallow air or is air hungry, and she does not complain of any condensation water accumulating.   2) Her Epworth sleepiness score is 6 points and her fatigue score 28 points.  When I met her last year she was very depressed and I think she is now having a more happy outlook again. Depression resolved.   3)RLS - on Requip with some anticipation.   4) Zocor for secondary stroke prevention was d/c by the patient for fear of having raised her blood glucose .   Plan:  Treatment plan and additional workup : continue CPAP use, I would like to start at 5 cm , RAMP time needs to be shorter - 97% compliance by  Medicare guidelines . New DME in [facility].    Audrielle Vankuren, MD   Cc ; Dr Gerarda Fraction

## 2015-12-16 NOTE — Patient Instructions (Signed)
Rv in 12 month with Np and CPAP download.

## 2016-01-04 ENCOUNTER — Emergency Department (HOSPITAL_COMMUNITY): Payer: Medicare Other

## 2016-01-04 ENCOUNTER — Telehealth: Payer: Self-pay | Admitting: Neurology

## 2016-01-04 ENCOUNTER — Encounter (HOSPITAL_COMMUNITY): Payer: Self-pay | Admitting: Family Medicine

## 2016-01-04 ENCOUNTER — Observation Stay (HOSPITAL_COMMUNITY)
Admission: EM | Admit: 2016-01-04 | Discharge: 2016-01-05 | Disposition: A | Discharge status: Home / Self Care | Payer: Medicare Other | Attending: Internal Medicine | Admitting: Internal Medicine

## 2016-01-04 DIAGNOSIS — E039 Hypothyroidism, unspecified: Secondary | ICD-10-CM | POA: Insufficient documentation

## 2016-01-04 DIAGNOSIS — I6782 Cerebral ischemia: Secondary | ICD-10-CM | POA: Insufficient documentation

## 2016-01-04 DIAGNOSIS — G2581 Restless legs syndrome: Secondary | ICD-10-CM | POA: Insufficient documentation

## 2016-01-04 DIAGNOSIS — E785 Hyperlipidemia, unspecified: Secondary | ICD-10-CM | POA: Insufficient documentation

## 2016-01-04 DIAGNOSIS — R269 Unspecified abnormalities of gait and mobility: Secondary | ICD-10-CM | POA: Diagnosis not present

## 2016-01-04 DIAGNOSIS — Z7982 Long term (current) use of aspirin: Secondary | ICD-10-CM | POA: Insufficient documentation

## 2016-01-04 DIAGNOSIS — Z87891 Personal history of nicotine dependence: Secondary | ICD-10-CM | POA: Diagnosis not present

## 2016-01-04 DIAGNOSIS — Z9884 Bariatric surgery status: Secondary | ICD-10-CM | POA: Insufficient documentation

## 2016-01-04 DIAGNOSIS — F329 Major depressive disorder, single episode, unspecified: Secondary | ICD-10-CM | POA: Insufficient documentation

## 2016-01-04 DIAGNOSIS — Z79899 Other long term (current) drug therapy: Secondary | ICD-10-CM | POA: Insufficient documentation

## 2016-01-04 DIAGNOSIS — F419 Anxiety disorder, unspecified: Secondary | ICD-10-CM | POA: Diagnosis not present

## 2016-01-04 DIAGNOSIS — R4701 Aphasia: Principal | ICD-10-CM

## 2016-01-04 DIAGNOSIS — Z6832 Body mass index (BMI) 32.0-32.9, adult: Secondary | ICD-10-CM | POA: Diagnosis not present

## 2016-01-04 DIAGNOSIS — Z8673 Personal history of transient ischemic attack (TIA), and cerebral infarction without residual deficits: Secondary | ICD-10-CM | POA: Diagnosis not present

## 2016-01-04 DIAGNOSIS — R5383 Other fatigue: Secondary | ICD-10-CM | POA: Diagnosis not present

## 2016-01-04 DIAGNOSIS — I10 Essential (primary) hypertension: Secondary | ICD-10-CM | POA: Insufficient documentation

## 2016-01-04 DIAGNOSIS — I639 Cerebral infarction, unspecified: Secondary | ICD-10-CM | POA: Diagnosis present

## 2016-01-04 DIAGNOSIS — G4733 Obstructive sleep apnea (adult) (pediatric): Secondary | ICD-10-CM | POA: Diagnosis not present

## 2016-01-04 DIAGNOSIS — R4182 Altered mental status, unspecified: Secondary | ICD-10-CM | POA: Diagnosis not present

## 2016-01-04 DIAGNOSIS — R29818 Other symptoms and signs involving the nervous system: Secondary | ICD-10-CM | POA: Diagnosis not present

## 2016-01-04 DIAGNOSIS — R270 Ataxia, unspecified: Secondary | ICD-10-CM | POA: Insufficient documentation

## 2016-01-04 LAB — I-STAT CHEM 8, ED
BUN: 12 mg/dL (ref 6–20)
CALCIUM ION: 1.16 mmol/L (ref 1.13–1.30)
CHLORIDE: 100 mmol/L — AB (ref 101–111)
CREATININE: 0.8 mg/dL (ref 0.44–1.00)
GLUCOSE: 95 mg/dL (ref 65–99)
HCT: 38 % (ref 36.0–46.0)
Hemoglobin: 12.9 g/dL (ref 12.0–15.0)
POTASSIUM: 3.6 mmol/L (ref 3.5–5.1)
Sodium: 139 mmol/L (ref 135–145)
TCO2: 27 mmol/L (ref 0–100)

## 2016-01-04 LAB — URINALYSIS, ROUTINE W REFLEX MICROSCOPIC
Bilirubin Urine: NEGATIVE
GLUCOSE, UA: NEGATIVE mg/dL
HGB URINE DIPSTICK: NEGATIVE
Ketones, ur: NEGATIVE mg/dL
Nitrite: NEGATIVE
Protein, ur: NEGATIVE mg/dL
SPECIFIC GRAVITY, URINE: 1.004 — AB (ref 1.005–1.030)
pH: 7 (ref 5.0–8.0)

## 2016-01-04 LAB — DIFFERENTIAL
Basophils Absolute: 0 10*3/uL (ref 0.0–0.1)
Basophils Relative: 1 %
EOS PCT: 6 %
Eosinophils Absolute: 0.5 10*3/uL (ref 0.0–0.7)
LYMPHS ABS: 2.6 10*3/uL (ref 0.7–4.0)
LYMPHS PCT: 31 %
MONO ABS: 0.9 10*3/uL (ref 0.1–1.0)
Monocytes Relative: 11 %
NEUTROS ABS: 4.4 10*3/uL (ref 1.7–7.7)
Neutrophils Relative %: 53 %

## 2016-01-04 LAB — COMPREHENSIVE METABOLIC PANEL
ALK PHOS: 174 U/L — AB (ref 38–126)
ALT: 18 U/L (ref 14–54)
AST: 24 U/L (ref 15–41)
Albumin: 4 g/dL (ref 3.5–5.0)
Anion gap: 11 (ref 5–15)
BILIRUBIN TOTAL: 0.7 mg/dL (ref 0.3–1.2)
BUN: 13 mg/dL (ref 6–20)
CALCIUM: 9.6 mg/dL (ref 8.9–10.3)
CO2: 26 mmol/L (ref 22–32)
CREATININE: 0.79 mg/dL (ref 0.44–1.00)
Chloride: 102 mmol/L (ref 101–111)
GFR calc non Af Amer: >60 mL/min (ref >60)
GLUCOSE: 100 mg/dL — AB (ref 65–99)
Potassium: 3.8 mmol/L (ref 3.5–5.1)
SODIUM: 139 mmol/L (ref 135–145)
TOTAL PROTEIN: 7.9 g/dL (ref 6.5–8.1)

## 2016-01-04 LAB — PROTIME-INR
INR: 1.1 (ref 0.00–1.49)
Prothrombin Time: 13.9 seconds (ref 11.6–15.2)

## 2016-01-04 LAB — CBC
HEMATOCRIT: 35.7 % — AB (ref 36.0–46.0)
HEMOGLOBIN: 11 g/dL — AB (ref 12.0–15.0)
MCH: 23.9 pg — AB (ref 26.0–34.0)
MCHC: 30.8 g/dL (ref 30.0–36.0)
MCV: 77.6 fL — AB (ref 78.0–100.0)
PLATELETS: 365 10*3/uL (ref 150–400)
RBC: 4.6 MIL/uL (ref 3.87–5.11)
RDW: 17.1 % — ABNORMAL HIGH (ref 11.5–15.5)
WBC: 8.4 10*3/uL (ref 4.0–10.5)

## 2016-01-04 LAB — URINE MICROSCOPIC-ADD ON

## 2016-01-04 LAB — CBG MONITORING, ED: Glucose-Capillary: 86 mg/dL (ref 65–99)

## 2016-01-04 LAB — I-STAT TROPONIN, ED: Troponin i, poc: 0 ng/mL (ref 0.00–0.08)

## 2016-01-04 LAB — APTT: aPTT: 32 seconds (ref 24–37)

## 2016-01-04 MED ORDER — VITAMIN B-12 100 MCG PO TABS
100.0000 ug | ORAL_TABLET | Freq: Every morning | ORAL | Status: DC
  Administered 2016-01-05: 100 ug via ORAL
  Filled 2016-01-04: qty 1

## 2016-01-04 MED ORDER — DULOXETINE HCL 60 MG PO CPEP
60.0000 mg | ORAL_CAPSULE | Freq: Every day | ORAL | Status: DC
  Administered 2016-01-05: 60 mg via ORAL
  Filled 2016-01-04: qty 1

## 2016-01-04 MED ORDER — LEVOTHYROXINE SODIUM 112 MCG PO TABS
112.0000 ug | ORAL_TABLET | Freq: Every day | ORAL | Status: DC
Start: 2016-01-05 — End: 2016-01-05
  Administered 2016-01-05: 112 ug via ORAL
  Filled 2016-01-04: qty 1

## 2016-01-04 MED ORDER — SODIUM CHLORIDE 0.9 % IV SOLN: 250.0000 mL | INTRAVENOUS | Status: DC | PRN

## 2016-01-04 MED ORDER — DULOXETINE HCL 30 MG PO CPEP
30.0000 mg | ORAL_CAPSULE | Freq: Every day | ORAL | Status: DC
  Administered 2016-01-05: 30 mg via ORAL
  Filled 2016-01-04: qty 1

## 2016-01-04 MED ORDER — ADULT MULTIVITAMIN W/MINERALS CH
1.0000 | ORAL_TABLET | Freq: Every morning | ORAL | Status: DC
  Administered 2016-01-05: 1 via ORAL
  Filled 2016-01-04: qty 1

## 2016-01-04 MED ORDER — STROKE: EARLY STAGES OF RECOVERY BOOK
Freq: Once | Status: AC
  Administered 2016-01-04: 1
  Filled 2016-01-04: qty 1

## 2016-01-04 MED ORDER — CALCIUM CARBONATE-VITAMIN D 500-200 MG-UNIT PO TABS
1.0000 | ORAL_TABLET | Freq: Every day | ORAL | Status: DC
  Administered 2016-01-05: 1 via ORAL
  Filled 2016-01-04: qty 1

## 2016-01-04 MED ORDER — ENOXAPARIN SODIUM 40 MG/0.4ML ~~LOC~~ SOLN
40.0000 mg | SUBCUTANEOUS | Status: DC
  Administered 2016-01-04: 40 mg via SUBCUTANEOUS
  Filled 2016-01-04: qty 0.4

## 2016-01-04 MED ORDER — LORAZEPAM 2 MG/ML IJ SOLN
1.0000 mg | Freq: Once | INTRAMUSCULAR | Status: AC
  Administered 2016-01-05: 1 mg via INTRAVENOUS
  Filled 2016-01-04: qty 1

## 2016-01-04 MED ORDER — SENNOSIDES-DOCUSATE SODIUM 8.6-50 MG PO TABS: 1.0000 | ORAL_TABLET | Freq: Every evening | ORAL | Status: DC | PRN

## 2016-01-04 MED ORDER — VITAMIN D 1000 UNITS PO TABS
1000.0000 [IU] | ORAL_TABLET | Freq: Every day | ORAL | Status: DC
  Administered 2016-01-05: 1000 [IU] via ORAL
  Filled 2016-01-04: qty 1

## 2016-01-04 MED ORDER — ACETAMINOPHEN 650 MG RE SUPP: 650.0000 mg | RECTAL | Status: DC | PRN

## 2016-01-04 MED ORDER — SODIUM CHLORIDE 0.9% FLUSH
3.0000 mL | Freq: Two times a day (BID) | INTRAVENOUS | Status: DC
  Administered 2016-01-04 – 2016-01-05 (×2): 3 mL via INTRAVENOUS

## 2016-01-04 MED ORDER — ASPIRIN 81 MG PO CHEW
81.0000 mg | CHEWABLE_TABLET | Freq: Every day | ORAL | Status: DC
  Administered 2016-01-05: 81 mg via ORAL
  Filled 2016-01-04: qty 1

## 2016-01-04 MED ORDER — SODIUM CHLORIDE 0.9% FLUSH: 3.0000 mL | INTRAVENOUS | Status: DC | PRN

## 2016-01-04 MED ORDER — ACETAMINOPHEN 325 MG PO TABS: 650.0000 mg | ORAL_TABLET | ORAL | Status: DC | PRN

## 2016-01-04 NOTE — ED Notes (Signed)
Pt reports she felt normal on Sunday night before going to bed around 23:00. Pt woke up yesterday morning, noticed around 7:00am she states she felt "like my brain was foggy", hard to focus. Pt left work early yesterday than her normal time. Pt reports she having a hard time to get her thoughts into words and taking her longer to say something. Pt has generalized weakness. On Sunday, pt reports she layed around the house more than she normally does.

## 2016-01-04 NOTE — Progress Notes (Signed)
Pt arrived to unit via [facility] 2110 hrs, A&Ox4, no C/O, no obvious distress, NIHSS-0, admission orders implemented, MD paged to notify of arrival, Pt oriented to room and equipment

## 2016-01-04 NOTE — ED Provider Notes (Signed)
CSN: [id number]     Arrival date & time 01/04/16  1547 History   First MD Initiated Contact with Patient 01/04/16 1615     Chief Complaint  Patient presents with  . Aphasia  . Altered Mental Status     (Consider location/radiation/quality/duration/timing/severity/associated sxs/prior Treatment) HPI Comments: Patient presents with weakness. She states that she woke up yesterday morning and felt off balance. She was normal when she went to bed the night before. She states yesterday and today she's been stumbling. She also feels like her brain this fall he. She states it's hard to focus and it's hard to get her words out. Her daughter states she hasn't really had slurred speech but she's taking a long time to get her words out and get her thoughts together. She denies any numbness or weakness in her extremities. She denies any facial numbness or drooping. She denies any vision changes. She denies any chest pain or shortness of breath. No abdominal pain. No nausea or vomiting. No urinary symptoms. She had a history of a stroke in 2013 with facial numbness and left-sided extremity numbness. She doesn't notice any residual symptoms.  Patient is a 70 y.o. female presenting with altered mental status.  Altered Mental Status Associated symptoms: no abdominal pain, no fever, no headaches, no nausea, no rash, no vomiting and no weakness     Past Medical History  Diagnosis Date  . Hypothyroidism   . Anxiety   . Depression   . Osteoporosis   . Sleep apnea   . HTN (hypertension)     resolved with weight loss s/p gastric bypass  . Stroke ([place])   . RLS (restless legs syndrome)    Past Surgical History  Procedure Laterality Date  . Gastric roux-en-y  2006  . Cholecystectomy    . Esophagogastroduodenoscopy  2004    small hiatal hernia  . Colonoscopy  2004    internal hemorrhoids, left-sided diverticulae, splenic flexure polyp (64mm), path unavailable at this time   Family History  Problem  Relation Age of Onset  . Cancer      family history   . Coronary artery disease      family history   . Arthritis      family history   . Lung cancer Father   . Stroke Father   . Stroke Mother   . Stroke Brother   . Stroke Maternal Grandmother   . Stroke Maternal Grandfather    Social History  Substance Use Topics  . Smoking status: Former Smoker    Quit date: 11/28/1987  . Smokeless tobacco: None  . Alcohol Use: No   OB History    No data available     Review of Systems  Constitutional: Positive for fatigue. Negative for fever, chills and diaphoresis.  HENT: Negative for congestion, rhinorrhea and sneezing.   Eyes: Negative.   Respiratory: Negative for cough, chest tightness and shortness of breath.   Cardiovascular: Negative for chest pain and leg swelling.  Gastrointestinal: Negative for nausea, vomiting, abdominal pain, diarrhea and blood in stool.  Genitourinary: Negative for frequency, hematuria, flank pain and difficulty urinating.  Musculoskeletal: Negative for back pain and arthralgias.  Skin: Negative for rash.  Neurological: Positive for speech difficulty. Negative for dizziness, weakness, numbness and headaches.      Allergies  Amoxicillin-pot clavulanate  Home Medications   Prior to Admission medications   Medication Sig Start Date End Date Taking? Authorizing Provider  ALPRAZolam Duanne Moron) 0.5 MG tablet Take 0.5 mg  by mouth daily as needed. For anxiety and/or sleep 03/27/11  Yes Historical Provider, MD  aspirin 81 MG tablet Take 81 mg by mouth daily.   Yes Historical Provider, MD  Calcium Carbonate-Vit D-Min (CALCIUM 1200 PO) Take 1,200 mg by mouth daily.   Yes Historical Provider, MD  Cholecalciferol (VITAMIN D-3) 1000 UNITS CAPS Take 1,000 Units by mouth daily.   Yes Historical Provider, MD  Cyanocobalamin (VITAMIN B-12 PO) Take 1 tablet by mouth every morning.   Yes Historical Provider, MD  DULoxetine (CYMBALTA) 30 MG capsule Take 30 mg by mouth  daily. 12/01/15  Yes Historical Provider, MD  DULoxetine (CYMBALTA) 60 MG capsule Take 60 mg by mouth daily.   Yes Historical Provider, MD  Multiple Vitamin (MULTIVITAMIN WITH MINERALS) TABS Take 1 tablet by mouth every morning.   Yes Historical Provider, MD  rOPINIRole (REQUIP) 0.25 MG tablet Take 1 tablet (0.25 mg total) by mouth at bedtime. 12/16/15  Yes Carmen Dohmeier, MD  SYNTHROID 112 MCG tablet Take 112 mcg by mouth daily. 10/09/15  Yes Historical Provider, MD   BP 179/81 mmHg  Pulse 82  Temp(Src) 97.7 F (36.5 C) (Oral)  Resp 20  Ht 5\' 4"  (1.626 m)  Wt 190 lb (86.183 kg)  BMI 32.60 kg/m2  SpO2 100% Physical Exam  Constitutional: She is oriented to person, place, and time. She appears well-developed and well-nourished.  HENT:  Head: Normocephalic and atraumatic.  Eyes: Pupils are equal, round, and reactive to light.  Neck: Normal range of motion. Neck supple.  Cardiovascular: Normal rate, regular rhythm and normal heart sounds.   Pulmonary/Chest: Effort normal and breath sounds normal. No respiratory distress. She has no wheezes. She has no rales. She exhibits no tenderness.  Abdominal: Soft. Bowel sounds are normal. There is no tenderness. There is no rebound and no guarding.  Musculoskeletal: Normal range of motion. She exhibits no edema.  Lymphadenopathy:    She has no cervical adenopathy.  Neurological: She is alert and oriented to person, place, and time. She has normal strength. No cranial nerve deficit or sensory deficit. GCS eye subscore is 4. GCS verbal subscore is 5. GCS motor subscore is 6.  Motor 5/5 all extremities, sensation grossly intact to light touch all extremities. No pronator drift, finger nose intact, no facial drooping. No obvious cranial nerve deficit.  Skin: Skin is warm and dry. No rash noted.  Psychiatric: She has a normal mood and affect.    ED Course  Procedures (including critical care time) Labs Review Labs Reviewed  CBC - Abnormal; Notable for  the following:    Hemoglobin 11.0 (*)    HCT 35.7 (*)    MCV 77.6 (*)    MCH 23.9 (*)    RDW 17.1 (*)    All other components within normal limits  COMPREHENSIVE METABOLIC PANEL - Abnormal; Notable for the following:    Glucose, Bld 100 (*)    Alkaline Phosphatase 174 (*)    All other components within normal limits  URINALYSIS, ROUTINE W REFLEX MICROSCOPIC (NOT AT [facility]) - Abnormal; Notable for the following:    Specific Gravity, Urine 1.004 (*)    Leukocytes, UA SMALL (*)    All other components within normal limits  URINE MICROSCOPIC-ADD ON - Abnormal; Notable for the following:    Squamous Epithelial / LPF 0-5 (*)    Bacteria, UA RARE (*)    All other components within normal limits  I-STAT CHEM 8, ED - Abnormal; Notable for the following:  Chloride 100 (*)    All other components within normal limits  PROTIME-INR  APTT  DIFFERENTIAL  I-STAT TROPOININ, ED  CBG MONITORING, ED    Imaging Review Ct Head Wo Contrast  01/04/2016  CLINICAL DATA:  Weakness. Mental status changes. Stroke-like symptoms. EXAM: CT HEAD WITHOUT CONTRAST TECHNIQUE: Contiguous axial images were obtained from the base of the skull through the vertex without intravenous contrast. COMPARISON:  CT 06/28/2012 and MRI of 06/29/2012. FINDINGS: Sinuses/Soft tissues: Ethmoid air cell mucosal thickening. Hyperostosis frontalis interna. Other paranasal sinuses and mastoid air cells clear. Intracranial: Mildly age advanced cerebral atrophy. Mild low density in the periventricular white matter likely related to small vessel disease. more focal hypoattenuation in the deep white matter of the left cerebral hemisphere on image 17 of series 2 is similar. No mass lesion, hemorrhage, hydrocephalus, acute infarct, intra-axial, or extra-axial fluid collection. IMPRESSION: 1.  No acute intracranial abnormality. 2.  Cerebral atrophy and small vessel ischemic change. 3. Sinus disease. Electronically Signed   By: Abigail Miyamoto M.D.   On:  01/04/2016 16:32   I have personally reviewed and evaluated these images and lab results as part of my medical decision-making.   EKG Interpretation   Date/Time:  Tuesday January 04 2016 16:01:56 EST Ventricular Rate:  71 PR Interval:  150 QRS Duration: 94 QT Interval:  403 QTC Calculation: 438 R Axis:   17 Text Interpretation:  Sinus rhythm Low voltage, precordial leads Baseline  wander in lead(s) V5 since last tracing no significant change Confirmed by  Laquenta Whitsell  MD, Shaine Newmark ([id number]) on 01/04/2016 5:10:16 PM      MDM   Final diagnoses:  Ataxia    Patient presents with ataxia and speech difficulty. She's not a candidate for intervention given her symptoms started yesterday morning. Her head CT is negative for intracranial hemorrhage or other abnormality. She doesn't have any obvious neurologic deficits currently. I spoke with Dr. Janann Colonel, the neuro hospitalist who recommended transfer to [facility] for further stroke evaluation. I discussed this with the hospitalist, Dr. Jonnie Finner who will do the H&P for admission to [facility].    Malvin Johns, MD 01/04/16 [phone]

## 2016-01-04 NOTE — ED Notes (Signed)
[facility] contacted for transportation 

## 2016-01-04 NOTE — Telephone Encounter (Signed)
Patient called and stated she has had stroke like symptoms yesterday and today.  She was advised to go to the emergency room.

## 2016-01-04 NOTE — H&P (Signed)
[facility] History and Physical  EMANUEL ZOCH [id number] DOB: [birth year] DOA: 01/04/2016  Referring physician: Dr. Tamera Punt PCP: [name], MD   Chief Complaint: Speech and gait difficulty  HPI: Virginia [name] is a 70 y.o. female with hx of HTN, OSA, gastric bypass in 2006, depression, acute CVA 2013 with mild LLE weakness as sequela.  Pt presenting to ED today with 1 1/2 day hx of foggy thinking, difficulty expressing herself and difficulty with balance/ gait.  Head CT in ED neg for bleed or CVA.  ED MD spoke with Dr Janann Colonel on call for neurology who recommended admit at [facility] for CVA workup and they will see tomorrow.    Pt here in 2013 with L sided weakness/ facial droop and w/u showed thalamic and medullary CVA.  She recovered mostly from that event.  Takes ASA , no nsaids or other blood thinners. No hx afib.   EKG showed NSR, no acute changes.    Patient states that she awakened yesterday morning feeling "not right", she was having difficulty forming thoughts and also difficutly walking.  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 Woke up this am with same issues, as day went on the thought/ speech problems became worse so came to ED.  Daughter says she has had notable delays in speaking, holding sounds longer than usual and difficutly walking holding onto walls, etc.  Dtr says she arriving to ED her symptoms have improved.    Chart review: 2006 > morbid obesity , underwent lap roux-en-Y gastrojejunostomy (gastric bypass).  Also HTN, OSA, IBS, depression Aug 2013 > acute CVA w L facial weakness/ L hand numbness; workup showed acute CVA thalaum and medulla. No afib, carotid dopp were negative for sig stenosis and echo pending at dc.   ROS  no CP  no sob  no abd pain, n/v/d  no joint pain  no HA or rash  no blurred visions  Where does patient live home Can patient participate in ADLs? yes  Past Medical History  Past Medical History   Diagnosis Date  . Hypothyroidism   . Anxiety   . Depression   . Osteoporosis   . Sleep apnea   . HTN (hypertension)     resolved with weight loss s/p gastric bypass  . Stroke ([place])   . RLS (restless legs syndrome)    Past Surgical History  Past Surgical History  Procedure Laterality Date  . Gastric roux-en-y  2006  . Cholecystectomy    . Esophagogastroduodenoscopy  2004    small hiatal hernia  . Colonoscopy  2004    internal hemorrhoids, left-sided diverticulae, splenic flexure polyp (29mm), path unavailable at this time   Family History  Family History  Problem Relation Age of Onset  . Cancer      family history   . Coronary artery disease      family history   . Arthritis      family history   . Lung cancer Father   . Stroke Father   . Stroke Mother   . Stroke Brother   . Stroke Maternal Grandmother   . Stroke Maternal Grandfather    Social History  reports that she quit smoking about 28 years ago. She does not have any smokeless tobacco history on file. She reports that she does not drink alcohol or use illicit drugs. Allergies PCN  Home medications Prior to Admission medications   Medication Sig Start Date End Date  Taking? Authorizing Provider  ALPRAZolam Duanne Moron) 0.5 MG tablet Take 0.5 mg by mouth daily as needed. For anxiety and/or sleep 03/27/11  Yes Historical Provider, MD  aspirin 81 MG tablet Take 81 mg by mouth daily.   Yes Historical Provider, MD  Calcium Carbonate-Vit D-Min (CALCIUM 1200 PO) Take 1,200 mg by mouth daily.   Yes Historical Provider, MD  Cholecalciferol (VITAMIN D-3) 1000 UNITS CAPS Take 1,000 Units by mouth daily.   Yes Historical Provider, MD  Cyanocobalamin (VITAMIN B-12 PO) Take 1 tablet by mouth every morning.   Yes Historical Provider, MD  DULoxetine (CYMBALTA) 30 MG capsule Take 30 mg by mouth daily. 12/01/15  Yes Historical Provider, MD  DULoxetine (CYMBALTA) 60 MG capsule Take 60 mg by mouth daily.   Yes Historical Provider, MD   Multiple Vitamin (MULTIVITAMIN WITH MINERALS) TABS Take 1 tablet by mouth every morning.   Yes Historical Provider, MD  rOPINIRole (REQUIP) 0.25 MG tablet Take 1 tablet (0.25 mg total) by mouth at bedtime. 12/16/15  Yes Carmen Dohmeier, MD  SYNTHROID 112 MCG tablet Take 112 mcg by mouth daily. 10/09/15  Yes Historical Provider, MD   Liver Function Tests  Recent Labs Lab 01/04/16 1632  AST 24  ALT 18  ALKPHOS 174*  BILITOT 0.7  PROT 7.9  ALBUMIN 4.0   No results for input(s): LIPASE, AMYLASE in the last 168 hours. CBC  Recent Labs Lab 01/04/16 1632 01/04/16 1653  WBC 8.4  --   NEUTROABS 4.4  --   HGB 11.0* 12.9  HCT 35.7* 38.0  MCV 77.6*  --   PLT 365  --    Basic Metabolic Panel  Recent Labs Lab 01/04/16 1632 01/04/16 1653  NA 139 139  K 3.8 3.6  CL 102 100*  CO2 26  --   GLUCOSE 100* 95  BUN 13 12  CREATININE 0.79 0.80  CALCIUM 9.6  --      Filed Vitals:   01/04/16 1604 01/04/16 1702  BP: 179/81   Pulse: 82   Temp: 97.7 F (36.5 C) 97.7 F (36.5 C)  TempSrc: Oral   Resp: 20   Height: 5\' 4"  (1.626 m)   Weight: 86.183 kg (190 lb)   SpO2: 100%    Exam: Alert no distress No rash, cyanosis or gangrene Sclera anicteric, throat clear No jvd Chest clear bilat RRR no mrg Abd soft ntnd no mass or ascites MS no joint chgs  Ext no edema, wounds or lesions Neuro alert, Ox 3 UE 5/5 strength, sens intact LE's 5/5 strength, sens intact CN's 2-12 intact, VF"s intact FTN and HTS tests normal Gait not tested Language ok, no dysarthria or notable aphasia  Home medications > cymbalta, ASA, prn xanax, CaCO3, vitamins, requip, synthroid  Assessment: 1 Speech and gait difficulty - poss CVA acute.  NIH stroke scale 0 at this time, pt improved since arriving per family. ED spoke w neuro on call recommending admit to [facility] and do CVA w/u.   2 HTN - BP 's up slightly , not on BP meds at home 3 Hx gastric bypass 4 Hx CVA 2013 5 Hypothyroid 6  Depression  Plan - admit [facility], stroke w/u. SQ lovenox for DVT proph. Watch BP, treat if goes up further.     DVT Prophylaxis lovenox  Code Status: full  Family Communication: at bedside  Disposition Plan: dc home when w/u complete    Sol Blazing [facility] Pager [phone]  Cell [phone]  If 7PM-7AM, please contact  night-coverage [email or web address] Password [facility] 01/04/2016, 6:39 PM

## 2016-01-04 NOTE — ED Notes (Signed)
Pt ambulated to bathroom with minimal assistance.

## 2016-01-04 NOTE — ED Notes (Signed)
Bed: WA01 Expected date:  Expected time:  Means of arrival:  Comments: Tr 3

## 2016-01-05 ENCOUNTER — Inpatient Hospital Stay (HOSPITAL_COMMUNITY): Payer: Medicare Other

## 2016-01-05 ENCOUNTER — Observation Stay (HOSPITAL_BASED_OUTPATIENT_CLINIC_OR_DEPARTMENT_OTHER): Payer: Medicare Other

## 2016-01-05 DIAGNOSIS — I10 Essential (primary) hypertension: Secondary | ICD-10-CM

## 2016-01-05 DIAGNOSIS — G458 Other transient cerebral ischemic attacks and related syndromes: Secondary | ICD-10-CM | POA: Diagnosis not present

## 2016-01-05 DIAGNOSIS — I639 Cerebral infarction, unspecified: Secondary | ICD-10-CM

## 2016-01-05 DIAGNOSIS — R4182 Altered mental status, unspecified: Secondary | ICD-10-CM | POA: Diagnosis not present

## 2016-01-05 DIAGNOSIS — R4701 Aphasia: Secondary | ICD-10-CM

## 2016-01-05 DIAGNOSIS — R531 Weakness: Secondary | ICD-10-CM | POA: Diagnosis not present

## 2016-01-05 DIAGNOSIS — I6789 Other cerebrovascular disease: Secondary | ICD-10-CM

## 2016-01-05 DIAGNOSIS — E039 Hypothyroidism, unspecified: Secondary | ICD-10-CM | POA: Diagnosis not present

## 2016-01-05 LAB — LIPID PANEL
Cholesterol: 184 mg/dL (ref 0–200)
HDL: 61 mg/dL (ref >40)
LDL CALC: 110 mg/dL — AB (ref 0–99)
TRIGLYCERIDES: 65 mg/dL (ref <150)
Total CHOL/HDL Ratio: 3 RATIO
VLDL: 13 mg/dL (ref 0–40)

## 2016-01-05 MED ORDER — ROPINIROLE HCL 0.25 MG PO TABS
0.2500 mg | ORAL_TABLET | Freq: Every day | ORAL | Status: DC
  Administered 2016-01-05: 0.25 mg via ORAL
  Filled 2016-01-05 (×2): qty 1

## 2016-01-05 MED ORDER — ATORVASTATIN CALCIUM 10 MG PO TABS: 10.0000 mg | ORAL_TABLET | Freq: Every day | ORAL | Status: DC

## 2016-01-05 MED ORDER — ALPRAZOLAM 0.5 MG PO TABS
0.5000 mg | ORAL_TABLET | Freq: Four times a day (QID) | ORAL | Status: DC | PRN
  Administered 2016-01-05: 0.5 mg via ORAL
  Filled 2016-01-05: qty 1

## 2016-01-05 NOTE — Progress Notes (Signed)
Echocardiogram 2D Echocardiogram has been performed.  01/05/2016 4:34 PM Maudry Mayhew, RVT, RDCS, RDMS

## 2016-01-05 NOTE — Progress Notes (Deleted)
Lipid Panel     Component Value Date/Time   CHOL 184 01/05/2016 0437   TRIG 65 01/05/2016 0437   HDL 61 01/05/2016 0437   CHOLHDL 3.0 01/05/2016 0437   VLDL 13 01/05/2016 0437   LDLCALC 110* 01/05/2016 0437    CBC    Component Value Date/Time   WBC 8.4 01/04/2016 1632   RBC 4.60 01/04/2016 1632   HGB 12.9 01/04/2016 1653   HCT 38.0 01/04/2016 1653   PLT 365 01/04/2016 1632   MCV 77.6* 01/04/2016 1632   MCH 23.9* 01/04/2016 1632   MCHC 30.8 01/04/2016 1632   RDW 17.1* 01/04/2016 1632   LYMPHSABS 2.6 01/04/2016 1632   MONOABS 0.9 01/04/2016 1632   EOSABS 0.5 01/04/2016 1632   BASOSABS 0.0 01/04/2016 1632    CMP Latest Ref Rng 01/04/2016 01/04/2016 03/01/2015  Glucose 65 - 99 mg/dL 95 100(H) -  BUN 6 - 20 mg/dL 12 13 -  Creatinine 0.44 - 1.00 mg/dL 0.80 0.79 0.80  Sodium 135 - 145 mmol/L 139 139 -  Potassium 3.5 - 5.1 mmol/L 3.6 3.8 -  Chloride 101 - 111 mmol/L 100(L) 102 -  CO2 22 - 32 mmol/L - 26 -  Calcium 8.9 - 10.3 mg/dL - 9.6 -  Total Protein 6.5 - 8.1 g/dL - 7.9 -  Total Bilirubin 0.3 - 1.2 mg/dL - 0.7 -  Alkaline Phos 38 - 126 U/L - 174(H) -  AST 15 - 41 U/L - 24 -  ALT 14 - 54 U/L - 18 -

## 2016-01-05 NOTE — Care Management Note (Signed)
Case Management Note  Patient Details  Name: RYLEA RINNER MRN: [id number] Date of Birth: August 13, 1946  Subjective/Objective:                    Action/Plan: Patient was admitted with aphasia, gait instability. Lives at home alone. Will follow for discharge needs pending PT/OT evals and physician orders.  Expected Discharge Date:                  Expected Discharge Plan:     In-House Referral:     Discharge planning Services     Post Acute Care Choice:    Choice offered to:     DME Arranged:    DME Agency:     HH Arranged:    HH Agency:     Status of Service:  In process, will continue to follow  Medicare Important Message Given:    Date Medicare IM Given:    Medicare IM give by:    Date Additional Medicare IM Given:    Additional Medicare Important Message give by:     If discussed at [place] of Stay Meetings, dates discussed:    Additional Comments:  Rolm Baptise, RN 01/05/2016, 1:16 PM [phone]

## 2016-01-05 NOTE — Progress Notes (Signed)
*  PRELIMINARY RESULTS* Vascular Ultrasound Carotid Duplex (Doppler) has been completed.  Preliminary findings: Bilateral: No significant (1-39%) ICA stenosis. Antegrade vertebral flow.    Landry Mellow, RDMS, RVT  01/05/2016, 3:48 PM

## 2016-01-05 NOTE — Progress Notes (Signed)
D/C orders received, pt for D/C home today.  IV and telemetry D/C.  Rx and D/C instructions given with verbalized understanding.  Family at bedside to assist with D/C.  Staff brought pt downstairs via wheelchair.  

## 2016-01-05 NOTE — Progress Notes (Signed)
OT Cancellation Note  Patient Details Name: Virginia [name] MRN: [id number] DOB: [birth year]   Cancelled Treatment:    Reason Eval/Treat Not Completed: Other (comment) (Per PT, pt declining therapy at this time.) Per PT, pt reports that she feels back at baseline level and does not need therapy services. Will sign off for OT at this time. Please re-consult if change in medical status occurs. Thank you for this referral.  [name] M.S., OTR/L Pager: [phone]  01/05/2016, 1:45 PM

## 2016-01-05 NOTE — Discharge Summary (Signed)
Discharge Summary  Virginia [name] [id number] DOB: 09/02/1946  PCP: [name], MD  Admit date: 01/04/2016 Discharge date: 01/05/2016  Time spent: 25 minutes  Recommendations for Outpatient Follow-up:  1. New medication: Lipitor 10 mg by mouth daily at bedtime 2. Patient will follow up with her PCP in the next one month . A1c drawn prior discharge needs to be followed up on  Discharge Diagnoses:  Active Hospital Problems   Diagnosis Date Noted  . Aphasia 01/04/2016  . Gait difficulty 01/04/2016  . Acute CVA (cerebrovascular accident) ([place]) 01/04/2016  . CVA (cerebral infarction) 01/04/2016  . Hypothyroidism 06/28/2012    Resolved Hospital Problems   Diagnosis Date Noted Date Resolved  No resolved problems to display.    Discharge Condition: Improved, being discharged home   Diet recommendation: Heart healthy   Filed Weights   01/04/16 1604 01/04/16 2136  Weight: 86.183 kg (190 lb) 88.769 kg (195 lb 11.2 oz)    History of present illness:  70 year old female past mental history of hypothyroidism and hyperlipidemia who presented on 2/7 with 1-2 days of symptoms of head swimming and word finding difficulty. On admission, CT scan of the head negative. Symptoms soon resolved. She'll admitted for stroke versus TIA workup:  Hospital Course:  Principal Problem:   Aphasia, felt to be secondary to TIA: Resolved. TIA workup unrevealing. Echocardiogram preliminary report negative. Carotid Dopplers note no evidence of significant stenoses. Lipid panel noted elevated LDL 110. Patient presented on Zocor, but had stopped this medicine several years ago after concerns for confusion and mildly elevated blood sugars which she had read as side effects. Cause felt to be from small vessel disease.  Patient already consistently takes a daily aspirin. A1c ordered with results pending, although nonfasting blood sugars have been in normal range. Patient passed bedside swallow evaluation on  admission. She declined PT and OT services, although noted to be ambulating without assistance to the bathroom  Active Problems:   Hypothyroidism: Stable, continue on Synthroid    Gait difficulty   History of CVA CVA (cerebral infarction)   Procedures:   echocardiogram done 2/8: Preliminary report negative  Carotid Dopplers done 2/8: [place] report notes no significant stenoses bilaterally  Consultations:   neurology  Discharge Exam: BP 122/60 mmHg  Pulse 82  Temp(Src) 98.7 F (37.1 C) (Oral)  Resp 18  Ht 5\' 4"  (1.626 m)  Wt 88.769 kg (195 lb 11.2 oz)  BMI 33.58 kg/m2  SpO2 97%  General:  alert and oriented 3, no acute distress Cardiovascular:  regular rate and rhythm, S1-S2  Respiratory:  clear to auscultation bilaterally   Discharge Instructions You were cared for by a hospitalist during your hospital stay. If you have any questions about your discharge medications or the care you received while you were in the hospital after you are discharged, you can call the unit and asked to speak with the hospitalist on call if the hospitalist that took care of you is not available. Once you are discharged, your primary care physician will handle any further medical issues. Please note that NO REFILLS for any discharge medications will be authorized once you are discharged, as it is imperative that you return to your primary care physician (or establish a relationship with a primary care physician if you do not have one) for your aftercare needs so that they can reassess your need for medications and monitor your lab values.  Discharge Instructions    Diet - low sodium heart healthy  Complete by:  As directed      Increase activity slowly    Complete by:  As directed             Medication List    TAKE these medications        ALPRAZolam 0.5 MG tablet  Commonly known as:  XANAX  Take 0.5 mg by mouth daily as needed. For anxiety and/or sleep     aspirin 81 MG tablet    Take 81 mg by mouth daily.     atorvastatin 10 MG tablet  Commonly known as:  LIPITOR  Take 1 tablet (10 mg total) by mouth daily.     CALCIUM 1200 PO  Take 1,200 mg by mouth daily.     DULoxetine 30 MG capsule  Commonly known as:  CYMBALTA  Take 30 mg by mouth daily.     DULoxetine 60 MG capsule  Commonly known as:  CYMBALTA  Take 60 mg by mouth daily.     multivitamin with minerals Tabs tablet  Take 1 tablet by mouth every morning.     rOPINIRole 0.25 MG tablet  Commonly known as:  REQUIP  Take 1 tablet (0.25 mg total) by mouth at bedtime.     SYNTHROID 112 MCG tablet  Generic drug:  levothyroxine  Take 112 mcg by mouth daily.     VITAMIN B-12 PO  Take 1 tablet by mouth every morning.     Vitamin D-3 1000 units Caps  Take 1,000 Units by mouth daily.       Allergies  Allergen Reactions  . Amoxicillin-Pot Clavulanate Hives and Rash    Has patient had a PCN reaction causing immediate rash, facial/tongue/throat swelling, SOB or lightheadedness with hypotension:  Has patient had a PCN reaction causing severe rash involving mucus membranes or skin necrosis:  Has patient had a PCN reaction that required hospitalization No Has patient had a PCN reaction occurring within the last 10 years: No If all of the above answers are "NO", then may proceed with Cephalosporin use.        Follow-up Information    Follow up with [name], MD In 1 month.   Specialty:  Internal Medicine   Contact information:   [address] [place] [id number] [phone]        The results of significant diagnostics from this hospitalization (including imaging, microbiology, ancillary and laboratory) are listed below for reference.    Significant Diagnostic Studies: Dg Chest 2 View  01/05/2016  CLINICAL DATA:  Recent stroke EXAM: CHEST  2 VIEW COMPARISON:  07/25/2013 FINDINGS: The heart size and mediastinal contours are within normal limits. Both lungs are clear. The  visualized skeletal structures are unremarkable. IMPRESSION: No active cardiopulmonary disease. Electronically Signed   By: Inez Catalina M.D.   On: 01/05/2016 10:04   Ct Head Wo Contrast  01/04/2016  CLINICAL DATA:  Weakness. Mental status changes. Stroke-like symptoms. EXAM: CT HEAD WITHOUT CONTRAST TECHNIQUE: Contiguous axial images were obtained from the base of the skull through the vertex without intravenous contrast. COMPARISON:  CT 06/28/2012 and MRI of 06/29/2012. FINDINGS: Sinuses/Soft tissues: Ethmoid air cell mucosal thickening. Hyperostosis frontalis interna. Other paranasal sinuses and mastoid air cells clear. Intracranial: Mildly age advanced cerebral atrophy. Mild low density in the periventricular white matter likely related to small vessel disease. more focal hypoattenuation in the deep white matter of the left cerebral hemisphere on image 17 of series 2 is similar. No mass lesion, hemorrhage, hydrocephalus, acute infarct,  intra-axial, or extra-axial fluid collection. IMPRESSION: 1.  No acute intracranial abnormality. 2.  Cerebral atrophy and small vessel ischemic change. 3. Sinus disease. Electronically Signed   By: Abigail Miyamoto M.D.   On: 01/04/2016 16:32   Mr Brain Wo Contrast  01/05/2016  CLINICAL DATA:  Mental status changes upon waking yesterday. Generalized weakness. Un clear with clots. EXAM: MRI HEAD WITHOUT CONTRAST MRA HEAD WITHOUT CONTRAST TECHNIQUE: Multiplanar, multiecho pulse sequences of the brain and surrounding structures were obtained without intravenous contrast. Angiographic images of the head were obtained using MRA technique without contrast. COMPARISON:  Head CT T7 2017.  MRI 06/28/2012. FINDINGS: MRI HEAD FINDINGS Diffusion imaging does not show any acute or subacute infarction. There are old small vessel infarctions affecting the brainstem. No focal cerebellar insult. Cerebral hemispheres show old small vessel ischemic changes affecting the thalami, basal ganglia and  hemispheric deep white matter. No cortical or large vessel territory infarction. No mass lesion, hemorrhage, hydrocephalus or extra-axial collection. Findings appear similar to the previous exam. No pituitary mass. No inflammatory sinus disease. No skull or skullbase lesion. MRA HEAD FINDINGS Moderate motion degradation. Both internal carotid arteries are patent into the brain. The anterior and middle cerebral vessels are patent without suspicion of proximal stenosis. No visible aneurysm or vascular malformation. Both vertebral arteries are patent to the basilar. No basilar stenosis is seen. Posterior circulation branch vessels show flow. IMPRESSION: No acute finding. Atrophy and chronic small vessel ischemic changes throughout the brain. Motion degraded MR angiogram. No major vessel occlusion or visible correctable proximal stenosis. Electronically Signed   By: Nelson Chimes M.D.   On: 01/05/2016 10:15   Mr Jodene Nam Head/brain Wo Cm  01/05/2016  CLINICAL DATA:  Mental status changes upon waking yesterday. Generalized weakness. Un clear with clots. EXAM: MRI HEAD WITHOUT CONTRAST MRA HEAD WITHOUT CONTRAST TECHNIQUE: Multiplanar, multiecho pulse sequences of the brain and surrounding structures were obtained without intravenous contrast. Angiographic images of the head were obtained using MRA technique without contrast. COMPARISON:  Head CT T7 2017.  MRI 06/28/2012. FINDINGS: MRI HEAD FINDINGS Diffusion imaging does not show any acute or subacute infarction. There are old small vessel infarctions affecting the brainstem. No focal cerebellar insult. Cerebral hemispheres show old small vessel ischemic changes affecting the thalami, basal ganglia and hemispheric deep white matter. No cortical or large vessel territory infarction. No mass lesion, hemorrhage, hydrocephalus or extra-axial collection. Findings appear similar to the previous exam. No pituitary mass. No inflammatory sinus disease. No skull or skullbase lesion.  MRA HEAD FINDINGS Moderate motion degradation. Both internal carotid arteries are patent into the brain. The anterior and middle cerebral vessels are patent without suspicion of proximal stenosis. No visible aneurysm or vascular malformation. Both vertebral arteries are patent to the basilar. No basilar stenosis is seen. Posterior circulation branch vessels show flow. IMPRESSION: No acute finding. Atrophy and chronic small vessel ischemic changes throughout the brain. Motion degraded MR angiogram. No major vessel occlusion or visible correctable proximal stenosis. Electronically Signed   By: Nelson Chimes M.D.   On: 01/05/2016 10:15    Microbiology: No results found for this or any previous visit (from the past 240 hour(s)).   Labs: Basic Metabolic Panel:  Recent Labs Lab 01/04/16 1632 01/04/16 1653  NA 139 139  K 3.8 3.6  CL 102 100*  CO2 26  --   GLUCOSE 100* 95  BUN 13 12  CREATININE 0.79 0.80  CALCIUM 9.6  --    Liver Function Tests:  Recent Labs Lab 01/04/16 1632  AST 24  ALT 18  ALKPHOS 174*  BILITOT 0.7  PROT 7.9  ALBUMIN 4.0   No results for input(s): LIPASE, AMYLASE in the last 168 hours. No results for input(s): AMMONIA in the last 168 hours. CBC:  Recent Labs Lab 01/04/16 1632 01/04/16 1653  WBC 8.4  --   NEUTROABS 4.4  --   HGB 11.0* 12.9  HCT 35.7* 38.0  MCV 77.6*  --   PLT 365  --    Cardiac Enzymes: No results for input(s): CKTOTAL, CKMB, CKMBINDEX, TROPONINI in the last 168 hours. BNP: BNP (last 3 results) No results for input(s): BNP in the last 8760 hours.  ProBNP (last 3 results) No results for input(s): PROBNP in the last 8760 hours.  CBG:  Recent Labs Lab 01/04/16 1655  GLUCAP 86       Signed:  Amariyah Bazar K  [facility] 01/05/2016, 5:30 PM

## 2016-01-05 NOTE — Progress Notes (Signed)
SLP Cancellation Note  Patient Details Name: Virginia [name] MRN: [id number] DOB: 03-23-1946   Cancelled treatment:       Reason Eval/Treat Not Completed: SLP screened, no needs identified, will sign off   Juan Quam Laurice 01/05/2016, 2:54 PM

## 2016-01-05 NOTE — Progress Notes (Signed)
OT Cancellation Note  Patient Details Name: Virginia [name] MRN: [id number] DOB: [birth year]   Cancelled Treatment:    Reason Eval/Treat Not Completed: Patient at procedure or test/ unavailable. Will follow up for OT eval as time allows and pt is appropriate.  [name] M.S., OTR/L Pager: [phone]  01/05/2016, 10:08 AM

## 2016-01-05 NOTE — Consult Note (Signed)
Requesting Physician: Dr. Maryland Pink    Chief Complaint: 1 1/2 day hx of foggy thinking, difficulty expressing herself and difficulty with balance/ gait.  HPI:                                                                                                                                         Virginia [name] is an 70 y.o. female with hx of HTN, OSA, gastric bypass in 2006, depression, acute CVA 2013 with mild LLE weakness as sequela. Pt presenting to ED today with 1 1/2 day hx of foggy thinking, difficulty expressing herself and difficulty with balance/ gait. Head CT in ED neg for bleed or CVA. Per chart "that she awakened yesterday morning feeling "not right", she was having difficulty forming thoughts and also difficutly walking. 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 Woke up this am with same issues, as day went on the thought/ speech problems became worse so came to ED. Daughter says she has had notable delays in speaking, holding sounds longer than usual and difficutly walking holding onto walls" she states symptoms lasted for about 1.5 days but now fully resolved. She admits to be under stress but not more than usual. Denies taking extra Xanax or ETOH.       Past Medical History  Diagnosis Date  . Hypothyroidism   . Anxiety   . Depression   . Osteoporosis   . Sleep apnea   . HTN (hypertension)     resolved with weight loss s/p gastric bypass  . Stroke ([place])   . RLS (restless legs syndrome)     Past Surgical History  Procedure Laterality Date  . Gastric roux-en-y  2006  . Cholecystectomy    . Esophagogastroduodenoscopy  2004    small hiatal hernia  . Colonoscopy  2004    internal hemorrhoids, left-sided diverticulae, splenic flexure polyp (84mm), path unavailable at this time    Family History  Problem Relation Age of Onset  . Cancer      family history   . Coronary artery disease      family history   . Arthritis      family history   . Lung  cancer Father   . Stroke Father   . Stroke Mother   . Stroke Brother   . Stroke Maternal Grandmother   . Stroke Maternal Grandfather    Social History:  reports that she quit smoking about 28 years ago. She does not have any smokeless tobacco history on file. She reports that she does not drink alcohol or use illicit drugs.  Allergies:  Allergies  Allergen Reactions  . Amoxicillin-Pot Clavulanate Hives and Rash        Medications:  Prior to Admission:  Prescriptions prior to admission  Medication Sig Dispense Refill Last Dose  . ALPRAZolam (XANAX) 0.5 MG tablet Take 0.5 mg by mouth daily as needed. For anxiety and/or sleep   01/03/2016 at Unknown time  . aspirin 81 MG tablet Take 81 mg by mouth daily.   01/04/2016 at Unknown time  . Calcium Carbonate-Vit D-Min (CALCIUM 1200 PO) Take 1,200 mg by mouth daily.   01/04/2016 at Unknown time  . Cholecalciferol (VITAMIN D-3) 1000 UNITS CAPS Take 1,000 Units by mouth daily.   01/04/2016 at Unknown time  . Cyanocobalamin (VITAMIN B-12 PO) Take 1 tablet by mouth every morning.   01/04/2016 at Unknown time  . DULoxetine (CYMBALTA) 30 MG capsule Take 30 mg by mouth daily.  3 01/04/2016 at Unknown time  . DULoxetine (CYMBALTA) 60 MG capsule Take 60 mg by mouth daily.   01/04/2016 at Unknown time  . Multiple Vitamin (MULTIVITAMIN WITH MINERALS) TABS Take 1 tablet by mouth every morning.   01/04/2016 at Unknown time  . rOPINIRole (REQUIP) 0.25 MG tablet Take 1 tablet (0.25 mg total) by mouth at bedtime. 90 tablet 3 01/03/2016 at Unknown time  . SYNTHROID 112 MCG tablet Take 112 mcg by mouth daily.  3 Today.    Scheduled: . aspirin  81 mg Oral Daily  . calcium-vitamin D  1 tablet Oral Q breakfast  . cholecalciferol  1,000 Units Oral Daily  . DULoxetine  30 mg Oral Daily  . DULoxetine  60 mg Oral Daily  . enoxaparin (LOVENOX) injection  40 mg  Subcutaneous Q24H  . levothyroxine  112 mcg Oral QAC breakfast  . multivitamin with minerals  1 tablet Oral q morning - 10a  . rOPINIRole  0.25 mg Oral QHS  . sodium chloride flush  3 mL Intravenous Q12H  . vitamin B-12  100 mcg Oral q morning - 10a    ROS:                                                                                                                                       History obtained from the patient  General ROS: negative for - chills, fatigue, fever, night sweats, weight gain or weight loss Psychological ROS: negative for - behavioral disorder, hallucinations, memory difficulties, mood swings or suicidal ideation Ophthalmic ROS: negative for - blurry vision, double vision, eye pain or loss of vision ENT ROS: negative for - epistaxis, nasal discharge, oral lesions, sore throat, tinnitus or vertigo Allergy and Immunology ROS: negative for - hives or itchy/watery eyes Hematological and Lymphatic ROS: negative for - bleeding problems, bruising or swollen lymph nodes Endocrine ROS: negative for - galactorrhea, hair pattern changes, polydipsia/polyuria or temperature intolerance Respiratory ROS: negative for - cough, hemoptysis, shortness of breath or wheezing Cardiovascular ROS: negative for - chest pain, dyspnea on exertion, edema or irregular heartbeat Gastrointestinal ROS: negative for - abdominal pain, diarrhea, hematemesis,  nausea/vomiting or stool incontinence Genito-Urinary ROS: negative for - dysuria, hematuria, incontinence or urinary frequency/urgency Musculoskeletal ROS: negative for - joint swelling or muscular weakness Neurological ROS: as noted in HPI Dermatological ROS: negative for rash and skin lesion changes  Neurologic Examination:                                                                                                      Blood pressure 144/86, pulse 78, temperature 98.4 F (36.9 C), temperature source Oral, resp. rate 18, height 5\' 4"  (1.626  m), weight 88.769 kg (195 lb 11.2 oz), SpO2 98 %.  HEENT-  Normocephalic, no lesions, without obvious abnormality.  Normal external eye and conjunctiva.  Normal TM's bilaterally.  Normal auditory canals and external ears. Normal external nose, mucus membranes and septum.  Normal pharynx. Cardiovascular- S1, S2 normal, pulses palpable throughout   Lungs- chest clear, no wheezing, rales, normal symmetric air entry Abdomen- normal findings: bowel sounds normal Extremities- no edema Lymph-no adenopathy palpable Musculoskeletal-no joint tenderness, deformity or swelling Skin-warm and dry, no hyperpigmentation, vitiligo, or suspicious lesions  Neurological Examination Mental Status: Alert, oriented, thought content appropriate.  Speech fluent without evidence of aphasia.  Able to follow 3 step commands without difficulty. Cranial Nerves: II: Discs flat bilaterally; Visual fields grossly normal, pupils equal, round, reactive to light and accommodation III,IV, VI: ptosis not present, extra-ocular motions intact bilaterally V,VII: smile symmetric, facial light touch sensation normal bilaterally VIII: hearing normal bilaterally IX,X: uvula rises symmetrically XI: bilateral shoulder shrug XII: midline tongue extension Motor: Right : Upper extremity   5/5    Left:     Upper extremity   5/5  Lower extremity   5/5     Lower extremity   5/5 Tone and bulk:normal tone throughout; no atrophy noted Sensory: Pinprick and light touch intact throughout, bilaterally Deep Tendon Reflexes: 2+ and symmetric throughout UE and no KJ or AJ Plantars: Right: downgoing   Left: downgoing Cerebellar: normal finger-to-nose, normal rapid alternating movements and normal heel-to-shin test Gait: not tested       Lab Results: Basic Metabolic Panel:  Recent Labs Lab 01/04/16 1632 01/04/16 1653  NA 139 139  K 3.8 3.6  CL 102 100*  CO2 26  --   GLUCOSE 100* 95  BUN 13 12  CREATININE 0.79 0.80  CALCIUM 9.6   --     Liver Function Tests:  Recent Labs Lab 01/04/16 1632  AST 24  ALT 18  ALKPHOS 174*  BILITOT 0.7  PROT 7.9  ALBUMIN 4.0   No results for input(s): LIPASE, AMYLASE in the last 168 hours. No results for input(s): AMMONIA in the last 168 hours.  CBC:  Recent Labs Lab 01/04/16 1632 01/04/16 1653  WBC 8.4  --   NEUTROABS 4.4  --   HGB 11.0* 12.9  HCT 35.7* 38.0  MCV 77.6*  --   PLT 365  --     Cardiac Enzymes: No results for input(s): CKTOTAL, CKMB, CKMBINDEX, TROPONINI in the last 168 hours.  Lipid Panel:  Recent Labs Lab 01/05/16 [id number]  CHOL 184  TRIG 65  HDL 61  CHOLHDL 3.0  VLDL 13  LDLCALC 110*    CBG:  Recent Labs Lab 01/04/16 1655  GLUCAP 86    Microbiology: Results for orders placed or performed during the hospital encounter of 06/28/12  Urine culture     Status: None   Collection Time: 06/28/12  2:59 PM  Result Value Ref Range Status   Specimen Description URINE, CLEAN CATCH  Final   Special Requests NONE  Final   Culture  Setup Time 06/29/2012 02:17  Final   Colony Count 15,000 COLONIES/ML  Final   Culture   Final    Multiple bacterial morphotypes present, none predominant. Suggest appropriate recollection if clinically indicated.   Report Status 06/30/2012 FINAL  Final    Coagulation Studies:  Recent Labs  01/04/16 1632  LABPROT 13.9  INR 1.10    Imaging: Ct Head Wo Contrast  01/04/2016  CLINICAL DATA:  Weakness. Mental status changes. Stroke-like symptoms. EXAM: CT HEAD WITHOUT CONTRAST TECHNIQUE: Contiguous axial images were obtained from the base of the skull through the vertex without intravenous contrast. COMPARISON:  CT 06/28/2012 and MRI of 06/29/2012. FINDINGS: Sinuses/Soft tissues: Ethmoid air cell mucosal thickening. Hyperostosis frontalis interna. Other paranasal sinuses and mastoid air cells clear. Intracranial: Mildly age advanced cerebral atrophy. Mild low density in the periventricular white matter likely  related to small vessel disease. more focal hypoattenuation in the deep white matter of the left cerebral hemisphere on image 17 of series 2 is similar. No mass lesion, hemorrhage, hydrocephalus, acute infarct, intra-axial, or extra-axial fluid collection. IMPRESSION: 1.  No acute intracranial abnormality. 2.  Cerebral atrophy and small vessel ischemic change. 3. Sinus disease. Electronically Signed   By: Abigail Miyamoto M.D.   On: 01/04/2016 16:32       Assessment and plan discussed with with attending physician and they are in agreement.    Virginia Quill PA-C [facility] [phone]  01/05/2016, 9:46 AM   Assessment: 70 y.o. female with transient prolonged speech and difficulty finding her words.  Currently fully resolved. MRI shows no acute infarct. Stroke work up pending. Although patient believes this lasted >24 hours still cannot rule out TIA or MRI (-) CVA.   Stroke Risk Factors - hypertension   Recommend: 1) Finish stroke work up 2) Continue ASA.

## 2016-01-05 NOTE — Progress Notes (Signed)
PT Cancellation Note  Patient Details Name: Virginia [name] MRN: [id number] DOB: [birth year]   Cancelled Treatment:    Reason Eval/Treat Not Completed: PT screened, no needs identified, will sign off.  Had long discussion with pt who states that she feels back to normal and does not wish to participate with therapy. Pt states she has been independent in her room did not note any dizziness or unsteadiness upon standing. Pt appears tired during discussion. Offered to come back later to complete evaluation however pt declined working with both PT and OT. OT notified. Will sign off, please reconsult if needs change.    Rolinda Roan 01/05/2016, 1:26 PM   Rolinda Roan, PT, DPT Acute Rehabilitation Services Pager: [phone]

## 2016-01-06 LAB — HEMOGLOBIN A1C
Hgb A1c MFr Bld: 5.7 % — ABNORMAL HIGH (ref 4.8–5.6)
Mean Plasma Glucose: 117 mg/dL

## 2016-01-07 DIAGNOSIS — Z6834 Body mass index (BMI) 34.0-34.9, adult: Secondary | ICD-10-CM | POA: Diagnosis not present

## 2016-01-07 DIAGNOSIS — L309 Dermatitis, unspecified: Secondary | ICD-10-CM | POA: Diagnosis not present

## 2016-01-07 DIAGNOSIS — I10 Essential (primary) hypertension: Secondary | ICD-10-CM | POA: Diagnosis not present

## 2016-01-07 DIAGNOSIS — M1991 Primary osteoarthritis, unspecified site: Secondary | ICD-10-CM | POA: Diagnosis not present

## 2016-01-07 DIAGNOSIS — E538 Deficiency of other specified B group vitamins: Secondary | ICD-10-CM | POA: Diagnosis not present

## 2016-01-07 DIAGNOSIS — Z1389 Encounter for screening for other disorder: Secondary | ICD-10-CM | POA: Diagnosis not present

## 2016-01-07 DIAGNOSIS — E063 Autoimmune thyroiditis: Secondary | ICD-10-CM | POA: Diagnosis not present

## 2016-01-22 ENCOUNTER — Other Ambulatory Visit: Payer: Self-pay | Admitting: Neurology

## 2016-02-29 DIAGNOSIS — E611 Iron deficiency: Secondary | ICD-10-CM | POA: Diagnosis not present

## 2016-03-07 DIAGNOSIS — D509 Iron deficiency anemia, unspecified: Secondary | ICD-10-CM | POA: Diagnosis not present

## 2016-03-21 DIAGNOSIS — Z1389 Encounter for screening for other disorder: Secondary | ICD-10-CM | POA: Diagnosis not present

## 2016-03-21 DIAGNOSIS — E6609 Other obesity due to excess calories: Secondary | ICD-10-CM | POA: Diagnosis not present

## 2016-03-21 DIAGNOSIS — I10 Essential (primary) hypertension: Secondary | ICD-10-CM | POA: Diagnosis not present

## 2016-03-21 DIAGNOSIS — D649 Anemia, unspecified: Secondary | ICD-10-CM | POA: Diagnosis not present

## 2016-03-21 DIAGNOSIS — E063 Autoimmune thyroiditis: Secondary | ICD-10-CM | POA: Diagnosis not present

## 2016-03-21 DIAGNOSIS — E782 Mixed hyperlipidemia: Secondary | ICD-10-CM | POA: Diagnosis not present

## 2016-03-21 DIAGNOSIS — Z6832 Body mass index (BMI) 32.0-32.9, adult: Secondary | ICD-10-CM | POA: Diagnosis not present

## 2016-03-21 DIAGNOSIS — E039 Hypothyroidism, unspecified: Secondary | ICD-10-CM | POA: Diagnosis not present

## 2016-03-21 DIAGNOSIS — F329 Major depressive disorder, single episode, unspecified: Secondary | ICD-10-CM | POA: Diagnosis not present

## 2016-04-04 ENCOUNTER — Encounter: Payer: Self-pay | Admitting: Orthopedic Surgery

## 2016-04-04 ENCOUNTER — Ambulatory Visit (INDEPENDENT_AMBULATORY_CARE_PROVIDER_SITE_OTHER): Payer: Medicare Other | Admitting: Orthopedic Surgery

## 2016-04-04 VITALS — Ht 64.0 in

## 2016-04-04 DIAGNOSIS — M65312 Trigger thumb, left thumb: Secondary | ICD-10-CM

## 2016-04-04 NOTE — Progress Notes (Signed)
Chief complaint triggering left thumb  Patient present request of injection left thumb for triggering  She does have pain over the A1 pulley demonstrates visible clicking and popping  Left Trigger thumb injection Medication  1 mL of 40 mg Depo-Medrol  2 mL of 1% lidocaine plain  Ethyl chloride for anesthesia  Verbal consent was obtained timeout was taken to confirm the injection site as left thumb  Alcohol was used to prepare the skin along with ethyl chloride and then the injection was made at the A1 pulley there were no complications

## 2016-05-02 DIAGNOSIS — E063 Autoimmune thyroiditis: Secondary | ICD-10-CM | POA: Diagnosis not present

## 2016-05-10 ENCOUNTER — Other Ambulatory Visit: Payer: Self-pay | Admitting: Internal Medicine

## 2016-05-10 DIAGNOSIS — N6310 Unspecified lump in the right breast, unspecified quadrant: Secondary | ICD-10-CM

## 2016-05-11 ENCOUNTER — Ambulatory Visit (INDEPENDENT_AMBULATORY_CARE_PROVIDER_SITE_OTHER): Payer: Medicare Other | Admitting: Gastroenterology

## 2016-05-11 ENCOUNTER — Other Ambulatory Visit: Payer: Self-pay

## 2016-05-11 ENCOUNTER — Encounter: Payer: Self-pay | Admitting: Gastroenterology

## 2016-05-11 VITALS — BP 142/70 | HR 83 | Temp 96.9°F | Ht 64.0 in | Wt 198.4 lb

## 2016-05-11 DIAGNOSIS — K5900 Constipation, unspecified: Secondary | ICD-10-CM

## 2016-05-11 DIAGNOSIS — I639 Cerebral infarction, unspecified: Secondary | ICD-10-CM

## 2016-05-11 DIAGNOSIS — Z860100 Personal history of colon polyps, unspecified: Secondary | ICD-10-CM

## 2016-05-11 MED ORDER — PEG-KCL-NACL-NASULF-NA ASC-C 100 G PO SOLR: 1.0000 | Freq: Once | ORAL | Status: AC

## 2016-05-11 MED ORDER — LINACLOTIDE 72 MCG PO CAPS: 72.0000 ug | ORAL_CAPSULE | Freq: Every day | ORAL | Status: DC

## 2016-05-11 NOTE — Progress Notes (Signed)
Primary Care Physician:  [name], MD  Primary Gastroenterologist:  Garfield Cornea, MD   Chief Complaint  Patient presents with  . OTHER    due colonoscopy/ hx of polyps    HPI:  Virginia [name] is a 70 y.o. female here to schedule surveillance colonoscopy for history of adenomatous colon polyps. Family history of colon polyps in her father as well. Since we Last saw her she has had 2 strokes. Last one in February. She is on aspirin and Lipitor. Continues to have issues with constipation. Bowel movements infrequent. Stools hard. Describes symptoms consistent with rectocele. Rare bright red blood noted with straining. No abdominal pain. No heartburn. No unintentional weight loss. No dysphagia.    Current Outpatient Prescriptions  Medication Sig Dispense Refill  . ALPRAZolam (XANAX) 0.5 MG tablet Take 0.5 mg by mouth daily as needed. For anxiety and/or sleep    . aspirin 81 MG tablet Take 81 mg by mouth daily.    Marland Kitchen atorvastatin (LIPITOR) 10 MG tablet Take 1 tablet (10 mg total) by mouth daily. 30 tablet 1  . Calcium Carbonate-Vit D-Min (CALCIUM 1200 PO) Take 1,200 mg by mouth daily.    . Cholecalciferol (VITAMIN D-3) 1000 UNITS CAPS Take 1,000 Units by mouth daily.    . Cyanocobalamin (VITAMIN B-12 PO) Take 1 tablet by mouth every morning.    . DULoxetine (CYMBALTA) 30 MG capsule Take 30 mg by mouth daily.  3  . DULoxetine (CYMBALTA) 60 MG capsule Take 60 mg by mouth daily.    . Multiple Vitamin (MULTIVITAMIN WITH MINERALS) TABS Take 1 tablet by mouth every morning.    Marland Kitchen rOPINIRole (REQUIP) 0.25 MG tablet Take 1 tablet (0.25 mg total) by mouth at bedtime. 90 tablet 3  . SYNTHROID 112 MCG tablet Take 137 mcg by mouth daily.   3   No current facility-administered medications for this visit.    Allergies as of 05/11/2016 - Review Complete 05/11/2016  Allergen Reaction Noted  . Amoxicillin-pot clavulanate Hives and Rash     Past Medical History  Diagnosis Date  .  Hypothyroidism   . Anxiety   . Depression   . Osteoporosis   . Sleep apnea   . HTN (hypertension)     resolved with weight loss s/p gastric bypass  . Stroke [facility])     2013/February 2017  . RLS (restless legs syndrome)     Past Surgical History  Procedure Laterality Date  . Gastric roux-en-y  2006  . Cholecystectomy    . Esophagogastroduodenoscopy  2004    small hiatal hernia  . Colonoscopy  2004    internal hemorrhoids, left-sided diverticulae, splenic flexure polyp (41mm), path unavailable at this time  . Colonoscopy  04/2011    RMR: Internal and external hemorrhoids, rectal tubular adenoma removed, left-sided diverticulosis    Family History  Problem Relation Age of Onset  . Cancer      family history   . Coronary artery disease      family history   . Arthritis      family history   . Lung cancer Father   . Stroke Father   . Stroke Mother   . Stroke Brother   . Stroke Maternal Grandmother   . Stroke Maternal Grandfather     Social History   Social History  . Marital Status: Divorced    Spouse Name: N/A  . Number of Children: 1  . Years of Education: college   Occupational History  . retired    .  Social History Main Topics  . Smoking status: Former Smoker    Quit date: 11/28/1987  . Smokeless tobacco: Not on file     Comment: Quit x 25 plus years  . Alcohol Use: No  . Drug Use: No  . Sexual Activity: Not on file   Other Topics Concern  . Not on file   Social History Narrative   Caffeine 6 cups daily.  Retired/[employer],   2 yrs college,  Divorced, one daughter.      ROS:  General: Negative for anorexia, weight loss, fever, chills, fatigue, weakness. Eyes: Negative for vision changes.  ENT: Negative for hoarseness, difficulty swallowing , nasal congestion. CV: Negative for chest pain, angina, palpitations, dyspnea on exertion, peripheral edema.  Respiratory: Negative for dyspnea at rest, dyspnea on exertion, cough, sputum, wheezing.  GI:  See history of present illness. GU:  Negative for dysuria, hematuria, urinary incontinence, urinary frequency, nocturnal urination.  MS: Negative for joint pain, low back pain.  Derm: Negative for rash or itching.  Neuro: Negative for weakness, abnormal sensation, seizure, frequent headaches, memory loss, confusion.  Psych: Negative for anxiety, depression, suicidal ideation, hallucinations.  Endo: Negative for unusual weight change.  Heme: Negative for bruising or bleeding. Allergy: Negative for rash or hives.    Physical Examination:  BP 142/70 mmHg  Pulse 83  Temp(Src) 96.9 F (36.1 C)  Ht 5\' 4"  (1.626 m)  Wt 198 lb 6.4 oz (89.994 kg)  BMI 34.04 kg/m2   General: Well-nourished, well-developed in no acute distress.  Head: Normocephalic, atraumatic.   Eyes: Conjunctiva pink, no icterus. Mouth: Oropharyngeal mucosa moist and pink , no lesions erythema or exudate. Neck: Supple without thyromegaly, masses, or lymphadenopathy.  Lungs: Clear to auscultation bilaterally.  Heart: Regular rate and rhythm, no murmurs rubs or gallops.  Abdomen: Bowel sounds are normal, nontender, nondistended, no hepatosplenomegaly or masses, no abdominal bruits or    hernia , no rebound or guarding.   Rectal: Not performed Extremities: No lower extremity edema. No clubbing or deformities.  Neuro: Alert and oriented x 4 , grossly normal neurologically.  Skin: Warm and dry, no rash or jaundice.   Psych: Alert and cooperative, normal mood and affect.  Labs:    No results found for: LIPASE Lab Results  Component Value Date   WBC 8.4 01/04/2016   HGB 12.9 01/04/2016   HCT 38.0 01/04/2016   MCV 77.6* 01/04/2016   PLT 365 01/04/2016     Imaging Studies: No results found.

## 2016-05-11 NOTE — Progress Notes (Signed)
cc'ed to pcp °

## 2016-05-11 NOTE — Assessment & Plan Note (Signed)
70 year old female here for surveillance colonoscopy for history of adenomatous colon polyps. Complains of constipation. Likely rectocele based on history. Begin Linzess 72 g daily. Samples and prescription provided. Proceed with colonoscopy in the near future.  I have discussed the risks, alternatives, benefits with regards to but not limited to the risk of reaction to medication, bleeding, infection, perforation and the patient is agreeable to proceed. Written consent to be obtained.  Encouraged patient to let us know if her bowel movements have not improved prior to her bowel prep in order to ensure adequate colon preparation.

## 2016-05-11 NOTE — Patient Instructions (Signed)
1. Start Linzess 53mcg daily on empty stomach for constipation. Samples provided and RX sent to your pharmacy. 2. Colonoscopy as scheduled. See separate instructions.

## 2016-05-12 ENCOUNTER — Other Ambulatory Visit: Payer: Self-pay | Admitting: Internal Medicine

## 2016-05-12 ENCOUNTER — Other Ambulatory Visit (HOSPITAL_COMMUNITY): Payer: Self-pay | Admitting: Internal Medicine

## 2016-05-12 DIAGNOSIS — N6310 Unspecified lump in the right breast, unspecified quadrant: Secondary | ICD-10-CM

## 2016-05-12 DIAGNOSIS — Z09 Encounter for follow-up examination after completed treatment for conditions other than malignant neoplasm: Secondary | ICD-10-CM

## 2016-06-01 ENCOUNTER — Encounter (HOSPITAL_COMMUNITY)
Admission: RE | Disposition: A | Discharge status: Home / Self Care | Payer: Self-pay | Source: Ambulatory Visit | Attending: Internal Medicine

## 2016-06-01 ENCOUNTER — Ambulatory Visit (HOSPITAL_COMMUNITY)
Admission: RE | Admit: 2016-06-01 | Discharge: 2016-06-01 | Disposition: A | Discharge status: Home / Self Care | Payer: Medicare Other | Source: Ambulatory Visit | Attending: Internal Medicine | Admitting: Internal Medicine

## 2016-06-01 ENCOUNTER — Encounter (HOSPITAL_COMMUNITY): Payer: Self-pay | Admitting: *Deleted

## 2016-06-01 DIAGNOSIS — Z1211 Encounter for screening for malignant neoplasm of colon: Secondary | ICD-10-CM | POA: Insufficient documentation

## 2016-06-01 DIAGNOSIS — G2581 Restless legs syndrome: Secondary | ICD-10-CM | POA: Insufficient documentation

## 2016-06-01 DIAGNOSIS — I10 Essential (primary) hypertension: Secondary | ICD-10-CM | POA: Insufficient documentation

## 2016-06-01 DIAGNOSIS — K5900 Constipation, unspecified: Secondary | ICD-10-CM

## 2016-06-01 DIAGNOSIS — F419 Anxiety disorder, unspecified: Secondary | ICD-10-CM | POA: Insufficient documentation

## 2016-06-01 DIAGNOSIS — Z881 Allergy status to other antibiotic agents status: Secondary | ICD-10-CM | POA: Insufficient documentation

## 2016-06-01 DIAGNOSIS — Z87891 Personal history of nicotine dependence: Secondary | ICD-10-CM | POA: Insufficient documentation

## 2016-06-01 DIAGNOSIS — Z9884 Bariatric surgery status: Secondary | ICD-10-CM | POA: Diagnosis not present

## 2016-06-01 DIAGNOSIS — Z860100 Personal history of colon polyps, unspecified: Secondary | ICD-10-CM | POA: Insufficient documentation

## 2016-06-01 DIAGNOSIS — F329 Major depressive disorder, single episode, unspecified: Secondary | ICD-10-CM | POA: Insufficient documentation

## 2016-06-01 DIAGNOSIS — E039 Hypothyroidism, unspecified: Secondary | ICD-10-CM | POA: Insufficient documentation

## 2016-06-01 DIAGNOSIS — M810 Age-related osteoporosis without current pathological fracture: Secondary | ICD-10-CM | POA: Insufficient documentation

## 2016-06-01 DIAGNOSIS — Z79899 Other long term (current) drug therapy: Secondary | ICD-10-CM | POA: Insufficient documentation

## 2016-06-01 DIAGNOSIS — K6289 Other specified diseases of anus and rectum: Secondary | ICD-10-CM | POA: Insufficient documentation

## 2016-06-01 DIAGNOSIS — Z9049 Acquired absence of other specified parts of digestive tract: Secondary | ICD-10-CM | POA: Diagnosis not present

## 2016-06-01 DIAGNOSIS — G4730 Sleep apnea, unspecified: Secondary | ICD-10-CM | POA: Insufficient documentation

## 2016-06-01 DIAGNOSIS — Z8673 Personal history of transient ischemic attack (TIA), and cerebral infarction without residual deficits: Secondary | ICD-10-CM | POA: Diagnosis not present

## 2016-06-01 DIAGNOSIS — Z7982 Long term (current) use of aspirin: Secondary | ICD-10-CM | POA: Insufficient documentation

## 2016-06-01 DIAGNOSIS — K5750 Diverticulosis of both small and large intestine without perforation or abscess without bleeding: Secondary | ICD-10-CM | POA: Insufficient documentation

## 2016-06-01 HISTORY — PX: COLONOSCOPY: SHX5424

## 2016-06-01 SURGERY — COLONOSCOPY
Anesthesia: Moderate Sedation

## 2016-06-01 MED ORDER — MIDAZOLAM HCL 5 MG/5ML IJ SOLN
INTRAMUSCULAR | Status: AC
  Filled 2016-06-01: qty 10

## 2016-06-01 MED ORDER — ONDANSETRON HCL 4 MG/2ML IJ SOLN
INTRAMUSCULAR | Status: DC | PRN
  Administered 2016-06-01: 4 mg via INTRAVENOUS

## 2016-06-01 MED ORDER — MIDAZOLAM HCL 5 MG/5ML IJ SOLN
INTRAMUSCULAR | Status: DC | PRN
  Administered 2016-06-01: 2 mg via INTRAVENOUS
  Administered 2016-06-01: 1 mg via INTRAVENOUS
  Administered 2016-06-01: 2 mg via INTRAVENOUS
  Administered 2016-06-01 (×2): 1 mg via INTRAVENOUS

## 2016-06-01 MED ORDER — STERILE WATER FOR IRRIGATION IR SOLN
Status: DC | PRN
  Administered 2016-06-01: 09:00:00

## 2016-06-01 MED ORDER — SODIUM CHLORIDE 0.9 % IV SOLN
INTRAVENOUS | Status: DC
  Administered 2016-06-01: 08:00:00 via INTRAVENOUS

## 2016-06-01 MED ORDER — MEPERIDINE HCL 100 MG/ML IJ SOLN
INTRAMUSCULAR | Status: AC
  Filled 2016-06-01: qty 2

## 2016-06-01 MED ORDER — ONDANSETRON HCL 4 MG/2ML IJ SOLN
INTRAMUSCULAR | Status: AC
  Filled 2016-06-01: qty 2

## 2016-06-01 MED ORDER — MEPERIDINE HCL 100 MG/ML IJ SOLN
INTRAMUSCULAR | Status: DC | PRN
  Administered 2016-06-01 (×2): 25 mg via INTRAVENOUS
  Administered 2016-06-01 (×2): 50 mg via INTRAVENOUS

## 2016-06-01 NOTE — H&P (View-Only) (Signed)
Primary Care Physician:  [name], MD  Primary Gastroenterologist:  Garfield Cornea, MD   Chief Complaint  Patient presents with  . OTHER    due colonoscopy/ hx of polyps    HPI:  Virginia [name] is a 70 y.o. female here to schedule surveillance colonoscopy for history of adenomatous colon polyps. Family history of colon polyps in her father as well. Since we Last saw her she has had 2 strokes. Last one in February. She is on aspirin and Lipitor. Continues to have issues with constipation. Bowel movements infrequent. Stools hard. Describes symptoms consistent with rectocele. Rare bright red blood noted with straining. No abdominal pain. No heartburn. No unintentional weight loss. No dysphagia.    Current Outpatient Prescriptions  Medication Sig Dispense Refill  . ALPRAZolam (XANAX) 0.5 MG tablet Take 0.5 mg by mouth daily as needed. For anxiety and/or sleep    . aspirin 81 MG tablet Take 81 mg by mouth daily.    Marland Kitchen atorvastatin (LIPITOR) 10 MG tablet Take 1 tablet (10 mg total) by mouth daily. 30 tablet 1  . Calcium Carbonate-Vit D-Min (CALCIUM 1200 PO) Take 1,200 mg by mouth daily.    . Cholecalciferol (VITAMIN D-3) 1000 UNITS CAPS Take 1,000 Units by mouth daily.    . Cyanocobalamin (VITAMIN B-12 PO) Take 1 tablet by mouth every morning.    . DULoxetine (CYMBALTA) 30 MG capsule Take 30 mg by mouth daily.  3  . DULoxetine (CYMBALTA) 60 MG capsule Take 60 mg by mouth daily.    . Multiple Vitamin (MULTIVITAMIN WITH MINERALS) TABS Take 1 tablet by mouth every morning.    Marland Kitchen rOPINIRole (REQUIP) 0.25 MG tablet Take 1 tablet (0.25 mg total) by mouth at bedtime. 90 tablet 3  . SYNTHROID 112 MCG tablet Take 137 mcg by mouth daily.   3   No current facility-administered medications for this visit.    Allergies as of 05/11/2016 - Review Complete 05/11/2016  Allergen Reaction Noted  . Amoxicillin-pot clavulanate Hives and Rash     Past Medical History  Diagnosis Date  .  Hypothyroidism   . Anxiety   . Depression   . Osteoporosis   . Sleep apnea   . HTN (hypertension)     resolved with weight loss s/p gastric bypass  . Stroke [facility])     2013/February 2017  . RLS (restless legs syndrome)     Past Surgical History  Procedure Laterality Date  . Gastric roux-en-y  2006  . Cholecystectomy    . Esophagogastroduodenoscopy  2004    small hiatal hernia  . Colonoscopy  2004    internal hemorrhoids, left-sided diverticulae, splenic flexure polyp (50mm), path unavailable at this time  . Colonoscopy  04/2011    RMR: Internal and external hemorrhoids, rectal tubular adenoma removed, left-sided diverticulosis    Family History  Problem Relation Age of Onset  . Cancer      family history   . Coronary artery disease      family history   . Arthritis      family history   . Lung cancer Father   . Stroke Father   . Stroke Mother   . Stroke Brother   . Stroke Maternal Grandmother   . Stroke Maternal Grandfather     Social History   Social History  . Marital Status: Divorced    Spouse Name: N/A  . Number of Children: 1  . Years of Education: college   Occupational History  . retired    .  Social History Main Topics  . Smoking status: Former Smoker    Quit date: 11/28/1987  . Smokeless tobacco: Not on file     Comment: Quit x 25 plus years  . Alcohol Use: No  . Drug Use: No  . Sexual Activity: Not on file   Other Topics Concern  . Not on file   Social History Narrative   Caffeine 6 cups daily.  Retired/[employer],   2 yrs college,  Divorced, one daughter.      ROS:  General: Negative for anorexia, weight loss, fever, chills, fatigue, weakness. Eyes: Negative for vision changes.  ENT: Negative for hoarseness, difficulty swallowing , nasal congestion. CV: Negative for chest pain, angina, palpitations, dyspnea on exertion, peripheral edema.  Respiratory: Negative for dyspnea at rest, dyspnea on exertion, cough, sputum, wheezing.  GI:  See history of present illness. GU:  Negative for dysuria, hematuria, urinary incontinence, urinary frequency, nocturnal urination.  MS: Negative for joint pain, low back pain.  Derm: Negative for rash or itching.  Neuro: Negative for weakness, abnormal sensation, seizure, frequent headaches, memory loss, confusion.  Psych: Negative for anxiety, depression, suicidal ideation, hallucinations.  Endo: Negative for unusual weight change.  Heme: Negative for bruising or bleeding. Allergy: Negative for rash or hives.    Physical Examination:  BP 142/70 mmHg  Pulse 83  Temp(Src) 96.9 F (36.1 C)  Ht 5\' 4"  (1.626 m)  Wt 198 lb 6.4 oz (89.994 kg)  BMI 34.04 kg/m2   General: Well-nourished, well-developed in no acute distress.  Head: Normocephalic, atraumatic.   Eyes: Conjunctiva pink, no icterus. Mouth: Oropharyngeal mucosa moist and pink , no lesions erythema or exudate. Neck: Supple without thyromegaly, masses, or lymphadenopathy.  Lungs: Clear to auscultation bilaterally.  Heart: Regular rate and rhythm, no murmurs rubs or gallops.  Abdomen: Bowel sounds are normal, nontender, nondistended, no hepatosplenomegaly or masses, no abdominal bruits or    hernia , no rebound or guarding.   Rectal: Not performed Extremities: No lower extremity edema. No clubbing or deformities.  Neuro: Alert and oriented x 4 , grossly normal neurologically.  Skin: Warm and dry, no rash or jaundice.   Psych: Alert and cooperative, normal mood and affect.  Labs:    No results found for: LIPASE Lab Results  Component Value Date   WBC 8.4 01/04/2016   HGB 12.9 01/04/2016   HCT 38.0 01/04/2016   MCV 77.6* 01/04/2016   PLT 365 01/04/2016     Imaging Studies: No results found.

## 2016-06-01 NOTE — Discharge Instructions (Signed)
Colonoscopy Discharge Instructions  Read the instructions outlined below and refer to this sheet in the next few weeks. These discharge instructions provide you with general information on caring for yourself after you leave the hospital. Your doctor may also give you specific instructions. While your treatment has been planned according to the most current medical practices available, unavoidable complications occasionally occur. If you have any problems or questions after discharge, call Dr. Gala Romney at [phone]. ACTIVITY  You may resume your regular activity, but move at a slower pace for the next 24 hours.   Take frequent rest periods for the next 24 hours.   Walking will help get rid of the air and reduce the bloated feeling in your belly (abdomen).   No driving for 24 hours (because of the medicine (anesthesia) used during the test).    Do not sign any important legal documents or operate any machinery for 24 hours (because of the anesthesia used during the test).  NUTRITION  Drink plenty of fluids.   You may resume your normal diet as instructed by your doctor.   Begin with a light meal and progress to your normal diet. Heavy or fried foods are harder to digest and may make you feel sick to your stomach (nauseated).   Avoid alcoholic beverages for 24 hours or as instructed.  MEDICATIONS  You may resume your normal medications unless your doctor tells you otherwise.  WHAT YOU CAN EXPECT TODAY  Some feelings of bloating in the abdomen.   Passage of more gas than usual.   Spotting of blood in your stool or on the toilet paper.  IF YOU HAD POLYPS REMOVED DURING THE COLONOSCOPY:  No aspirin products for 7 days or as instructed.   No alcohol for 7 days or as instructed.   Eat a soft diet for the next 24 hours.  FINDING OUT THE RESULTS OF YOUR TEST Not all test results are available during your visit. If your test results are not back during the visit, make an appointment  with your caregiver to find out the results. Do not assume everything is normal if you have not heard from your caregiver or the medical facility. It is important for you to follow up on all of your test results.  SEEK IMMEDIATE MEDICAL ATTENTION IF:  You have more than a spotting of blood in your stool.   Your belly is swollen (abdominal distention).   You are nauseated or vomiting.   You have a temperature over 101.   You have abdominal pain or discomfort that is severe or gets worse throughout the day.    Constipation and diverticulosis information provided  MiraLAX one capful daily as needed for constipation  Begin Benefiber 1 tablespoon twice daily  Repeat colonoscopy in 5 years.  Diverticulosis Diverticulosis is the condition that develops when small pouches (diverticula) form in the wall of your colon. Your colon, or large intestine, is where water is absorbed and stool is formed. The pouches form when the inside layer of your colon pushes through weak spots in the outer layers of your colon. CAUSES  No one knows exactly what causes diverticulosis. RISK FACTORS  Being older than 4. Your risk for this condition increases with age. Diverticulosis is rare in people younger than 40 years. By age 35, almost everyone has it.  Eating a low-fiber diet.  Being frequently constipated.  Being overweight.  Not getting enough exercise.  Smoking.  Taking over-the-counter pain medicines, like aspirin and ibuprofen. SYMPTOMS  Most people with diverticulosis do not have symptoms. DIAGNOSIS  Because diverticulosis often has no symptoms, health care providers often discover the condition during an exam for other colon problems. In many cases, a health care provider will diagnose diverticulosis while using a flexible scope to examine the colon (colonoscopy). TREATMENT  If you have never developed an infection related to diverticulosis, you may not need treatment. If you have had an  infection before, treatment may include:  Eating more fruits, vegetables, and grains.  Taking a fiber supplement.  Taking a live bacteria supplement (probiotic).  Taking medicine to relax your colon. HOME CARE INSTRUCTIONS   Drink at least 6-8 glasses of water each day to prevent constipation.  Try not to strain when you have a bowel movement.  Keep all follow-up appointments. If you have had an infection before:  Increase the fiber in your diet as directed by your health care provider or dietitian.  Take a dietary fiber supplement if your health care provider approves.  Only take medicines as directed by your health care provider. SEEK MEDICAL CARE IF:   You have abdominal pain.  You have bloating.  You have cramps.  You have not gone to the bathroom in 3 days. SEEK IMMEDIATE MEDICAL CARE IF:   Your pain gets worse.  Yourbloating becomes very bad.  You have a fever or chills, and your symptoms suddenly get worse.  You begin vomiting.  You have bowel movements that are bloody or black. MAKE SURE YOU:  Understand these instructions.  Will watch your condition.  Will get help right away if you are not doing well or get worse.   This information is not intended to replace advice given to you by your health care provider. Make sure you discuss any questions you have with your health care provider.   Document Released: 08/10/2004 Document Revised: 11/18/2013 Document Reviewed: 10/08/2013 Elsevier Interactive Patient Education [employer].

## 2016-06-01 NOTE — Op Note (Signed)
[facility] Patient Name: Virginia [name] Procedure Date: 06/01/2016 8:09 AM MRN: [id number] Date of Birth: Mar 05, 1946 Attending MD: Norvel Richards , MD CSN: [id number] Age: 70 Admit Type: Outpatient Procedure:                Colonoscopy Indications:              High risk colon cancer surveillance: Personal                            history of colonic polyps Providers:                Norvel Richards, MD, Otis Peak B. Sharon Seller, RN,                            Randa Spike, Technician Referring MD:              Medicines:                Midazolam 7 mg IV, Meperidine [id number] mg IV Complications:            No immediate complications. Estimated Blood Loss:     Estimated blood loss was minimal. Procedure:                Pre-Anesthesia Assessment:                           - Prior to the procedure, a History and Physical                            was performed, and patient medications and                            allergies were reviewed. [name] tolerance of                            previous anesthesia was also reviewed. The risks                            and benefits of the procedure and the sedation                            options and risks were discussed with the patient.                            All questions were answered, and informed consent                            was obtained. Prior Anticoagulants: The patient has                            taken no previous anticoagulant or antiplatelet                            agents. ASA Grade Assessment: II - A patient with  mild systemic disease. After reviewing the risks                            and benefits, the patient was deemed in                            satisfactory condition to undergo the procedure.                           After obtaining informed consent, the colonoscope                            was passed under direct vision. Throughout the     procedure, the patient's blood pressure, pulse, and                            oxygen saturations were monitored continuously. The                            [id number]) was introduced through the anus                            and advanced to the the cecum, identified by                            appendiceal orifice and ileocecal valve. The                            colonoscopy was performed without difficulty. The                            patient tolerated the procedure well. The quality                            of the bowel preparation was adequate. The                            ileocecal valve, appendiceal orifice, and rectum                            were photographed. The entire colon was well                            visualized. Scope In: 8:42:00 AM Scope Out: 9:05:43 AM Scope Withdrawal Time: 0 hours 12 minutes 25 seconds  Total Procedure Duration: 0 hours 23 minutes 43 seconds  Findings:      The perianal exam findings include hypertrophied anal papilla(e).      Multiple medium-mouthed diverticula were found in the entire colon.       Estimated blood loss was minimal.      The colon (entire examined portion) was moderately redundant. Advancing       the scope required using manual pressure.      The exam was otherwise without abnormality on direct and retroflexion       views. Impression:               -  Hypertrophied anal papilla(e) found on perianal                            exam.                           - Diverticulosis in the entire examined colon.                           - Redundant colon.                           - The examination was otherwise normal on direct                            and retroflexion views.                           - No specimens collected. lenses was effective                            managing constipation but couldn't pay too                            expensive. Moderate Sedation:      Moderate (conscious) sedation  was administered by the endoscopy nurse       and supervised by the endoscopist. The following parameters were       monitored: oxygen saturation, heart rate, blood pressure, respiratory       rate, EKG, adequacy of pulmonary ventilation, and response to care.       Total physician intraservice time was 35 minutes. Recommendation:           - Patient has a contact number available for                            emergencies. The signs and symptoms of potential                            delayed complications were discussed with the                            patient. Return to normal activities tomorrow.                            Written discharge instructions were provided to the                            patient.                           - Advance diet as tolerated today.                           - Use Benefiber one tablespoon PO BID. MiraLAX one  capful daily as needed. Repeat colonoscopy in 5                            years.                           - Patient has a contact number available for                            emergencies. The signs and symptoms of potential                            delayed complications were discussed with the                            patient. Return to normal activities tomorrow.                            Written discharge instructions were provided to the                            patient.                           - Repeat colonoscopy in 5 years for surveillance. Procedure Code(s):        --- Professional ---                           [phone], Colonoscopy, flexible; diagnostic, including                            collection of specimen(s) by brushing or washing,                            when performed (separate procedure)                           99152, Moderate sedation services provided by the                            same physician or other qualified health care                            professional  performing the diagnostic or                            therapeutic service that the sedation supports,                            requiring the presence of an independent trained                            observer to assist in the monitoring of the  patient's level of consciousness and physiological                            status; initial 15 minutes of intraservice time,                            patient age 42 years or older                           [id number], Moderate sedation services; each additional                            15 minutes intraservice time Diagnosis Code(s):        --- Professional ---                           Z86.010, Personal history of colonic polyps                           K62.89, Other specified diseases of anus and rectum                           K57.30, Diverticulosis of large intestine without                            perforation or abscess without bleeding                           Q43.8, Other specified congenital malformations of                            intestine CPT copyright 2016 [employer]. All rights reserved. The codes documented in this report are preliminary and upon coder review may  be revised to meet current compliance requirements. Cristopher Estimable. Mychaela Lennartz, MD Norvel Richards, MD 06/01/2016 9:23:16 AM This report has been signed electronically. Number of Addenda: 0

## 2016-06-01 NOTE — Interval H&P Note (Signed)
History and Physical Interval Note:  06/01/2016 8:26 AM  Virginia [name]  has presented today for surgery, with the diagnosis of constipation, history of colon polyps  The various methods of treatment have been discussed with the patient and family. After consideration of risks, benefits and other options for treatment, the patient has consented to  Procedure(s) with comments: COLONOSCOPY (N/A) - 0830 as a surgical intervention .  [name] history has been reviewed, patient examined, no change in status, stable for surgery.  I have reviewed the patient's chart and labs.  Questions were answered to the patient's satisfaction.     Virginia [name]  Linzess helped -  too expensive. Surveillance colonoscopy per plan.  The risks, benefits, limitations, alternatives and imponderables have been reviewed with the patient. Questions have been answered. All parties are agreeable.

## 2016-06-06 ENCOUNTER — Ambulatory Visit (HOSPITAL_COMMUNITY)
Admission: RE | Admit: 2016-06-06 | Discharge: 2016-06-06 | Disposition: A | Discharge status: Home / Self Care | Payer: Medicare Other | Source: Ambulatory Visit | Attending: Internal Medicine | Admitting: Internal Medicine

## 2016-06-06 DIAGNOSIS — N63 Unspecified lump in breast: Secondary | ICD-10-CM | POA: Insufficient documentation

## 2016-06-06 DIAGNOSIS — R928 Other abnormal and inconclusive findings on diagnostic imaging of breast: Secondary | ICD-10-CM | POA: Diagnosis not present

## 2016-06-06 DIAGNOSIS — N6310 Unspecified lump in the right breast, unspecified quadrant: Secondary | ICD-10-CM

## 2016-06-06 DIAGNOSIS — Z09 Encounter for follow-up examination after completed treatment for conditions other than malignant neoplasm: Secondary | ICD-10-CM

## 2016-06-07 ENCOUNTER — Encounter (HOSPITAL_COMMUNITY): Payer: Self-pay | Admitting: Internal Medicine

## 2016-10-26 DIAGNOSIS — Z23 Encounter for immunization: Secondary | ICD-10-CM | POA: Diagnosis not present

## 2016-10-31 DIAGNOSIS — H524 Presbyopia: Secondary | ICD-10-CM | POA: Diagnosis not present

## 2016-10-31 DIAGNOSIS — E119 Type 2 diabetes mellitus without complications: Secondary | ICD-10-CM | POA: Diagnosis not present

## 2016-11-09 DIAGNOSIS — F419 Anxiety disorder, unspecified: Secondary | ICD-10-CM | POA: Diagnosis not present

## 2016-11-09 DIAGNOSIS — R5383 Other fatigue: Secondary | ICD-10-CM | POA: Diagnosis not present

## 2016-11-09 DIAGNOSIS — R002 Palpitations: Secondary | ICD-10-CM | POA: Diagnosis not present

## 2016-11-09 DIAGNOSIS — E669 Obesity, unspecified: Secondary | ICD-10-CM | POA: Diagnosis not present

## 2016-11-09 DIAGNOSIS — Z1389 Encounter for screening for other disorder: Secondary | ICD-10-CM | POA: Diagnosis not present

## 2016-11-09 DIAGNOSIS — I10 Essential (primary) hypertension: Secondary | ICD-10-CM | POA: Diagnosis not present

## 2016-11-09 DIAGNOSIS — E063 Autoimmune thyroiditis: Secondary | ICD-10-CM | POA: Diagnosis not present

## 2016-11-09 DIAGNOSIS — Z6837 Body mass index (BMI) 37.0-37.9, adult: Secondary | ICD-10-CM | POA: Diagnosis not present

## 2016-11-10 DIAGNOSIS — M810 Age-related osteoporosis without current pathological fracture: Secondary | ICD-10-CM | POA: Diagnosis not present

## 2016-11-10 DIAGNOSIS — E063 Autoimmune thyroiditis: Secondary | ICD-10-CM | POA: Diagnosis not present

## 2016-11-10 DIAGNOSIS — Z1389 Encounter for screening for other disorder: Secondary | ICD-10-CM | POA: Diagnosis not present

## 2016-11-10 DIAGNOSIS — R5383 Other fatigue: Secondary | ICD-10-CM | POA: Diagnosis not present

## 2016-11-10 DIAGNOSIS — Z79899 Other long term (current) drug therapy: Secondary | ICD-10-CM | POA: Diagnosis not present

## 2016-11-10 DIAGNOSIS — I10 Essential (primary) hypertension: Secondary | ICD-10-CM | POA: Diagnosis not present

## 2016-11-10 DIAGNOSIS — M353 Polymyalgia rheumatica: Secondary | ICD-10-CM | POA: Diagnosis not present

## 2016-11-12 DIAGNOSIS — Z1389 Encounter for screening for other disorder: Secondary | ICD-10-CM | POA: Diagnosis not present

## 2016-11-12 DIAGNOSIS — I10 Essential (primary) hypertension: Secondary | ICD-10-CM | POA: Diagnosis not present

## 2016-11-13 DIAGNOSIS — H25013 Cortical age-related cataract, bilateral: Secondary | ICD-10-CM | POA: Diagnosis not present

## 2016-11-13 DIAGNOSIS — H43812 Vitreous degeneration, left eye: Secondary | ICD-10-CM | POA: Diagnosis not present

## 2016-11-13 DIAGNOSIS — H40013 Open angle with borderline findings, low risk, bilateral: Secondary | ICD-10-CM | POA: Diagnosis not present

## 2016-11-13 DIAGNOSIS — H35372 Puckering of macula, left eye: Secondary | ICD-10-CM | POA: Diagnosis not present

## 2016-11-24 DIAGNOSIS — E748 Other specified disorders of carbohydrate metabolism: Secondary | ICD-10-CM | POA: Diagnosis not present

## 2016-11-24 DIAGNOSIS — R748 Abnormal levels of other serum enzymes: Secondary | ICD-10-CM | POA: Diagnosis not present

## 2016-11-24 DIAGNOSIS — R945 Abnormal results of liver function studies: Secondary | ICD-10-CM | POA: Diagnosis not present

## 2016-11-24 DIAGNOSIS — E063 Autoimmune thyroiditis: Secondary | ICD-10-CM | POA: Diagnosis not present

## 2016-11-24 DIAGNOSIS — R700 Elevated erythrocyte sedimentation rate: Secondary | ICD-10-CM | POA: Diagnosis not present

## 2016-11-27 HISTORY — PX: CATARACT EXTRACTION: SUR2

## 2016-11-30 ENCOUNTER — Encounter (INDEPENDENT_AMBULATORY_CARE_PROVIDER_SITE_OTHER): Payer: Medicare Other | Admitting: Ophthalmology

## 2016-11-30 DIAGNOSIS — H2513 Age-related nuclear cataract, bilateral: Secondary | ICD-10-CM | POA: Diagnosis not present

## 2016-11-30 DIAGNOSIS — H353131 Nonexudative age-related macular degeneration, bilateral, early dry stage: Secondary | ICD-10-CM

## 2016-11-30 DIAGNOSIS — I10 Essential (primary) hypertension: Secondary | ICD-10-CM | POA: Diagnosis not present

## 2016-11-30 DIAGNOSIS — H35372 Puckering of macula, left eye: Secondary | ICD-10-CM | POA: Diagnosis not present

## 2016-11-30 DIAGNOSIS — H35033 Hypertensive retinopathy, bilateral: Secondary | ICD-10-CM | POA: Diagnosis not present

## 2016-11-30 DIAGNOSIS — H43813 Vitreous degeneration, bilateral: Secondary | ICD-10-CM

## 2016-12-07 ENCOUNTER — Encounter (HOSPITAL_COMMUNITY): Payer: Self-pay | Admitting: *Deleted

## 2016-12-07 ENCOUNTER — Emergency Department (HOSPITAL_COMMUNITY): Payer: Medicare Other

## 2016-12-07 ENCOUNTER — Emergency Department (HOSPITAL_COMMUNITY)
Admission: EM | Admit: 2016-12-07 | Discharge: 2016-12-07 | Disposition: A | Discharge status: Home / Self Care | Payer: Medicare Other | Attending: Emergency Medicine | Admitting: Emergency Medicine

## 2016-12-07 DIAGNOSIS — S0083XA Contusion of other part of head, initial encounter: Secondary | ICD-10-CM | POA: Diagnosis not present

## 2016-12-07 DIAGNOSIS — R42 Dizziness and giddiness: Secondary | ICD-10-CM | POA: Diagnosis not present

## 2016-12-07 DIAGNOSIS — Z79899 Other long term (current) drug therapy: Secondary | ICD-10-CM | POA: Insufficient documentation

## 2016-12-07 DIAGNOSIS — S0990XA Unspecified injury of head, initial encounter: Secondary | ICD-10-CM | POA: Diagnosis present

## 2016-12-07 DIAGNOSIS — W19XXXA Unspecified fall, initial encounter: Secondary | ICD-10-CM

## 2016-12-07 DIAGNOSIS — Z87891 Personal history of nicotine dependence: Secondary | ICD-10-CM | POA: Insufficient documentation

## 2016-12-07 DIAGNOSIS — Y929 Unspecified place or not applicable: Secondary | ICD-10-CM | POA: Insufficient documentation

## 2016-12-07 DIAGNOSIS — Y999 Unspecified external cause status: Secondary | ICD-10-CM | POA: Diagnosis not present

## 2016-12-07 DIAGNOSIS — Z7982 Long term (current) use of aspirin: Secondary | ICD-10-CM | POA: Diagnosis not present

## 2016-12-07 DIAGNOSIS — E039 Hypothyroidism, unspecified: Secondary | ICD-10-CM | POA: Insufficient documentation

## 2016-12-07 DIAGNOSIS — R51 Headache: Secondary | ICD-10-CM | POA: Diagnosis not present

## 2016-12-07 DIAGNOSIS — W01198A Fall on same level from slipping, tripping and stumbling with subsequent striking against other object, initial encounter: Secondary | ICD-10-CM | POA: Insufficient documentation

## 2016-12-07 DIAGNOSIS — S064X0A Epidural hemorrhage without loss of consciousness, initial encounter: Secondary | ICD-10-CM | POA: Diagnosis not present

## 2016-12-07 DIAGNOSIS — Y939 Activity, unspecified: Secondary | ICD-10-CM | POA: Insufficient documentation

## 2016-12-07 DIAGNOSIS — S199XXA Unspecified injury of neck, initial encounter: Secondary | ICD-10-CM | POA: Diagnosis not present

## 2016-12-07 DIAGNOSIS — S098XXA Other specified injuries of head, initial encounter: Secondary | ICD-10-CM | POA: Diagnosis not present

## 2016-12-07 DIAGNOSIS — I10 Essential (primary) hypertension: Secondary | ICD-10-CM | POA: Diagnosis not present

## 2016-12-07 DIAGNOSIS — S0181XA Laceration without foreign body of other part of head, initial encounter: Secondary | ICD-10-CM | POA: Diagnosis not present

## 2016-12-07 MED ORDER — GI COCKTAIL ~~LOC~~
30.0000 mL | Freq: Once | ORAL | Status: AC
  Administered 2016-12-07: 30 mL via ORAL
  Filled 2016-12-07: qty 30

## 2016-12-07 MED ORDER — HYDROCODONE-ACETAMINOPHEN 5-325 MG PO TABS: ORAL_TABLET | ORAL | 0 refills | Status: DC

## 2016-12-07 NOTE — ED Notes (Signed)
Pt back from x-ray.

## 2016-12-07 NOTE — ED Notes (Signed)
Pt complained of pain/indigestion in mid chest area. No radiating symptoms. Pt states having hx of indigestion. MD made aware of complaints. Verbal order given for GI cocktail. Pt states relief after belching.

## 2016-12-07 NOTE — Discharge Instructions (Signed)
Follow-up with your family doctor this week if any problems. 

## 2016-12-07 NOTE — ED Triage Notes (Signed)
Pt tripped over space heater and hit head on brick wall; pt has swelling to left side of head

## 2016-12-07 NOTE — ED Notes (Signed)
Pt taken to xray 

## 2016-12-07 NOTE — ED Provider Notes (Signed)
[facility] DEPT Provider Note   CSN: [id number] Arrival date & time: 12/07/16  [id number]     History   Chief Complaint Chief Complaint  Patient presents with  . Fall    HPI Virginia [name] is a 71 y.o. female.  Patient states that she tripped and fell and hit her head no loss of consciousness patient has pain in her forehead   The history is provided by the patient. No language interpreter was used.  Fall  This is a new problem. The current episode started 3 to 5 hours ago. The problem occurs constantly. The problem has not changed since onset.Associated symptoms include headaches. Pertinent negatives include no chest pain and no abdominal pain. Nothing aggravates the symptoms. Nothing relieves the symptoms. She has tried nothing for the symptoms.    Past Medical History:  Diagnosis Date  . Anxiety   . Depression   . HTN (hypertension)    resolved with weight loss s/p gastric bypass  . Hypothyroidism   . Osteoporosis   . RLS (restless legs syndrome)   . Sleep apnea   . Stroke [facility])    2013/February 2017    Patient Active Problem List   Diagnosis Date Noted  . History of colonic polyps   . Constipation 05/11/2016  . Gait difficulty 01/04/2016  . Aphasia 01/04/2016  . Acute CVA (cerebrovascular accident) ([place]) 01/04/2016  . CVA (cerebral infarction) 01/04/2016  . Ataxia   . OSA on CPAP 12/02/2013  . CTS (carpal tunnel syndrome) 03/18/2013  . Wrist fracture 02/04/2013  . Distal radius fracture 12/26/2012  . Hyperlipidemia 06/30/2012  . Elevated blood pressure reading without diagnosis of hypertension 06/29/2012  . Stroke ([place]) 06/28/2012  . Hypothyroidism 06/28/2012  . Anxiety 06/28/2012  . Personal history of colonic polyps 04/26/2011  . Family history of colonic polyps 04/26/2011  . ARTHRITIS, LEFT KNEE 06/06/2010  . DERANGEMENT OF POSTERIOR HORN OF MEDIAL MENISCUS 06/06/2010  . ANEMIA, B12 DEFICIENCY 02/07/2010  . TRIGGER FINGER, THUMB 02/07/2010  .  SHOULDER PAIN 10/14/2008  . BURSITIS, SHOULDER 10/14/2008  . TRIGGER FINGER 06/15/2008    Past Surgical History:  Procedure Laterality Date  . CHOLECYSTECTOMY    . COLONOSCOPY  2004   internal hemorrhoids, left-sided diverticulae, splenic flexure polyp (18mm), path unavailable at this time  . COLONOSCOPY  04/2011   RMR: Internal and external hemorrhoids, rectal tubular adenoma removed, left-sided diverticulosis  . COLONOSCOPY N/A 06/01/2016   Procedure: COLONOSCOPY;  Surgeon: Daneil Dolin, MD;  Location: [facility]ITE;  Service: Endoscopy;  Laterality: N/A;  0830  . ESOPHAGOGASTRODUODENOSCOPY  2004   small hiatal hernia  . GASTRIC ROUX-EN-Y  2006    OB History    No data available       Home Medications    Prior to Admission medications   Medication Sig Start Date End Date Taking? Authorizing Provider  aspirin EC 81 MG tablet Take 81 mg by mouth daily.   Yes Historical Provider, MD  atorvastatin (LIPITOR) 10 MG tablet Take 1 tablet (10 mg total) by mouth daily. 01/05/16  Yes Annita Brod, MD  busPIRone (BUSPAR) 5 MG tablet Take 5 mg by mouth 2 (two) times daily.  04/30/16  Yes Historical Provider, MD  Calcium Carbonate-Vit D-Min (CALCIUM 1200 PO) Take 1,200 mg by mouth daily.   Yes Historical Provider, MD  Cholecalciferol (VITAMIN D-3) 1000 UNITS CAPS Take 1,000 Units by mouth daily.   Yes Historical Provider, MD  Cyanocobalamin (VITAMIN B-12 PO) Take 1  tablet by mouth every morning.   Yes Historical Provider, MD  DULoxetine (CYMBALTA) 30 MG capsule Take 30 mg by mouth daily. 12/01/15  Yes Historical Provider, MD  DULoxetine (CYMBALTA) 60 MG capsule Take 60 mg by mouth daily.   Yes Historical Provider, MD  levothyroxine (SYNTHROID, LEVOTHROID) 137 MCG tablet Take 137 mcg by mouth daily before breakfast.   Yes Historical Provider, MD  Multiple Vitamin (MULTIVITAMIN WITH MINERALS) TABS Take 1 tablet by mouth every morning.   Yes Historical Provider, MD  rOPINIRole (REQUIP) 0.25 MG  tablet Take 1 tablet (0.25 mg total) by mouth at bedtime. 12/16/15  Yes Carmen Dohmeier, MD  ALPRAZolam Duanne Moron) 0.5 MG tablet Take 0.5 mg by mouth daily as needed. For anxiety and/or sleep 03/27/11   Historical Provider, MD  HYDROcodone-acetaminophen (NORCO/VICODIN) 5-325 MG tablet Take one every 6 hours for pain not helped by tylenol 12/07/16   Milton Ferguson, MD  linaclotide [facility]) 72 MCG capsule Take 1 capsule (72 mcg total) by mouth daily before breakfast. Patient not taking: Reported on 12/07/2016 05/11/16   Mahala Menghini, PA-C    Family History Family History  Problem Relation Age of Onset  . Lung cancer Father   . Stroke Father   . Stroke Mother   . Stroke Brother   . Stroke Maternal Grandmother   . Stroke Maternal Grandfather   . Cancer      family history   . Coronary artery disease      family history   . Arthritis      family history     Social History Social History  Substance Use Topics  . Smoking status: Former Smoker    Quit date: 11/28/1987  . Smokeless tobacco: Never Used     Comment: Quit x 25 plus years  . Alcohol use No     Allergies   Amoxicillin-pot clavulanate   Review of Systems Review of Systems  Constitutional: Negative for appetite change and fatigue.  HENT: Negative for congestion, ear discharge and sinus pressure.   Eyes: Negative for discharge.  Respiratory: Negative for cough.   Cardiovascular: Negative for chest pain.  Gastrointestinal: Negative for abdominal pain and diarrhea.  Genitourinary: Negative for frequency and hematuria.  Musculoskeletal: Negative for back pain.  Skin: Negative for rash.  Neurological: Positive for headaches. Negative for seizures.  Psychiatric/Behavioral: Negative for hallucinations.     Physical Exam Updated Vital Signs BP 187/83   Pulse 69   Temp 97.6 F (36.4 C) (Oral)   Resp 18   Ht 5\' 4"  (1.626 m)   Wt 200 lb (90.7 kg)   SpO2 100%   BMI 34.33 kg/m   Physical Exam  Constitutional: She is  oriented to person, place, and time. She appears well-developed.  HENT:  Bruising and abrasion to left forehead  Eyes: Conjunctivae and EOM are normal. No scleral icterus.  Neck: Neck supple. No thyromegaly present.  Cardiovascular: Normal rate and regular rhythm.  Exam reveals no gallop and no friction rub.   No murmur heard. Pulmonary/Chest: No stridor. She has no wheezes. She has no rales. She exhibits no tenderness.  Abdominal: She exhibits no distension. There is no tenderness. There is no rebound.  Musculoskeletal: Normal range of motion. She exhibits no edema.  Lymphadenopathy:    She has no cervical adenopathy.  Neurological: She is oriented to person, place, and time. She exhibits normal muscle tone. Coordination normal.  Skin: No rash noted. No erythema.  Psychiatric: She has a normal mood  and affect. Her behavior is normal.     ED Treatments / Results  Labs (all labs ordered are listed, but only abnormal results are displayed) Labs Reviewed - No data to display  EKG  EKG Interpretation None       Radiology Ct Head Wo Contrast  Result Date: 12/07/2016 CLINICAL DATA:  Tripped on space heater this morning, hit left side of forehead on brick wall, dizziness EXAM: CT HEAD WITHOUT CONTRAST CT CERVICAL SPINE WITHOUT CONTRAST TECHNIQUE: Multidetector CT imaging of the head and cervical spine was performed following the standard protocol without intravenous contrast. Multiplanar CT image reconstructions of the cervical spine were also generated. COMPARISON:  01/05/2016 brain MRI FINDINGS: CT HEAD FINDINGS Brain: No intracranial hemorrhage, mass effect or midline shift. No acute cortical infarction. No mass lesion is noted on this unenhanced scan. Vascular: Mild atherosclerotic calcifications of carotid siphon. Skull: No skull fracture is noted. Sinuses/Orbits: No paranasal sinuses air-fluid levels. Other: Significant scalp swelling and subcutaneous stranding in left frontal region.  There is a scalp subcutaneous hematoma left frontal region measures 5.2 cm length by 1.4 cm thickness. Mild left scalp supraorbital anteriorly soft tissue swelling. CT CERVICAL SPINE FINDINGS Alignment: Normal alignment. Skull base and vertebrae: Degenerative changes are noted C1-C2 articulation. No acute fracture or subluxation. Mild anterior spurring lower endplate of D34-534 and C6 vertebral body. Soft tissues and spinal canal: No spinal canal hematoma. No prevertebral soft tissue swelling. Cervical airway is patent. Disc levels: There is disc space flattening with mild posterior disc bulge at C3-C4 level. Disc space flattening with mild anterior mild posterior spurring at C4-C5 and C5-C6 level. Mild posterior disc bulge at C6-C7 level. Upper chest: The visualized lung apices shows no evidence of pneumothorax. Other: There is calcified left ligamentum flavum posterior aspect of spinal canal at C5-C6 level. Please see axial image 55. No significant spinal canal stenosis. IMPRESSION: 1. No acute intracranial abnormality. Significant scalp swelling subcutaneous stranding and scalp hematoma in left frontal region. 2. No skull fracture.  No acute cortical infarction. 3. No cervical spine acute fracture or subluxation. Mild degenerative changes as described above. Electronically Signed   By: Lahoma Crocker M.D.   On: 12/07/2016 08:27   Ct Cervical Spine Wo Contrast  Result Date: 12/07/2016 CLINICAL DATA:  Tripped on space heater this morning, hit left side of forehead on brick wall, dizziness EXAM: CT HEAD WITHOUT CONTRAST CT CERVICAL SPINE WITHOUT CONTRAST TECHNIQUE: Multidetector CT imaging of the head and cervical spine was performed following the standard protocol without intravenous contrast. Multiplanar CT image reconstructions of the cervical spine were also generated. COMPARISON:  01/05/2016 brain MRI FINDINGS: CT HEAD FINDINGS Brain: No intracranial hemorrhage, mass effect or midline shift. No acute cortical  infarction. No mass lesion is noted on this unenhanced scan. Vascular: Mild atherosclerotic calcifications of carotid siphon. Skull: No skull fracture is noted. Sinuses/Orbits: No paranasal sinuses air-fluid levels. Other: Significant scalp swelling and subcutaneous stranding in left frontal region. There is a scalp subcutaneous hematoma left frontal region measures 5.2 cm length by 1.4 cm thickness. Mild left scalp supraorbital anteriorly soft tissue swelling. CT CERVICAL SPINE FINDINGS Alignment: Normal alignment. Skull base and vertebrae: Degenerative changes are noted C1-C2 articulation. No acute fracture or subluxation. Mild anterior spurring lower endplate of D34-534 and C6 vertebral body. Soft tissues and spinal canal: No spinal canal hematoma. No prevertebral soft tissue swelling. Cervical airway is patent. Disc levels: There is disc space flattening with mild posterior disc bulge at C3-C4 level.  Disc space flattening with mild anterior mild posterior spurring at C4-C5 and C5-C6 level. Mild posterior disc bulge at C6-C7 level. Upper chest: The visualized lung apices shows no evidence of pneumothorax. Other: There is calcified left ligamentum flavum posterior aspect of spinal canal at C5-C6 level. Please see axial image 55. No significant spinal canal stenosis. IMPRESSION: 1. No acute intracranial abnormality. Significant scalp swelling subcutaneous stranding and scalp hematoma in left frontal region. 2. No skull fracture.  No acute cortical infarction. 3. No cervical spine acute fracture or subluxation. Mild degenerative changes as described above. Electronically Signed   By: Lahoma Crocker M.D.   On: 12/07/2016 08:27    Procedures Procedures (including critical care time)  Medications Ordered in ED Medications  gi cocktail (Maalox,Lidocaine,Donnatal) (30 mLs Oral Given 12/07/16 1118)     Initial Impression / Assessment and Plan / ED Course  I have reviewed the triage vital signs and the nursing  notes.  Pertinent labs & imaging results that were available during my care of the patient were reviewed by me and considered in my medical decision making (see chart for details).  Clinical Course     CT scan of head and cervical spine negative. Patient will be discharged home for rest and given Vicodin if Tylenol does not help her headache.  Final Clinical Impressions(s) / ED Diagnoses   Final diagnoses:  Fall, initial encounter    New Prescriptions New Prescriptions   HYDROCODONE-ACETAMINOPHEN (NORCO/VICODIN) 5-325 MG TABLET    Take one every 6 hours for pain not helped by tylenol     Milton Ferguson, MD 12/07/16 1313

## 2016-12-18 ENCOUNTER — Ambulatory Visit: Payer: Medicare Other | Admitting: Adult Health

## 2016-12-18 ENCOUNTER — Encounter: Payer: Self-pay | Admitting: Adult Health

## 2016-12-19 ENCOUNTER — Ambulatory Visit (INDEPENDENT_AMBULATORY_CARE_PROVIDER_SITE_OTHER): Payer: Medicare Other | Admitting: Endocrinology

## 2016-12-19 ENCOUNTER — Encounter: Payer: Self-pay | Admitting: Endocrinology

## 2016-12-19 VITALS — BP 170/94 | HR 92 | Ht 64.0 in | Wt 209.0 lb

## 2016-12-19 DIAGNOSIS — F339 Major depressive disorder, recurrent, unspecified: Secondary | ICD-10-CM

## 2016-12-19 DIAGNOSIS — I10 Essential (primary) hypertension: Secondary | ICD-10-CM | POA: Diagnosis not present

## 2016-12-19 DIAGNOSIS — R0989 Other specified symptoms and signs involving the circulatory and respiratory systems: Secondary | ICD-10-CM

## 2016-12-19 DIAGNOSIS — L680 Hirsutism: Secondary | ICD-10-CM | POA: Diagnosis not present

## 2016-12-19 DIAGNOSIS — R7989 Other specified abnormal findings of blood chemistry: Secondary | ICD-10-CM

## 2016-12-19 MED ORDER — DEXAMETHASONE 1 MG PO TABS: ORAL_TABLET | ORAL | 0 refills | Status: DC

## 2016-12-19 NOTE — Patient Instructions (Addendum)
Take calcium at nite, not with the thyroid pill  The cortisone test will be done in the morning when you come in fasting. Take the dexamethasone tablet at bedtime the night before May do the urine collection for 24 hours the day before you come in for the lab work

## 2016-12-19 NOTE — Progress Notes (Signed)
Patient ID: Virginia [name], female   DOB: 1945/12/29, 71 y.o.   MRN: [id number]           Referring physician: Gerarda Fraction  Chief complaint: Feeling tired  History of Present Illness:  Complains of persistent fatigue for about a year.  She also has had associated decreased motivation level She thinks she feels sleepy all the time and tends to sleep more than usual She has had long-standing history of depression and has more recently been changed to Cymbalta but she does not think this works any better than previous treatment She has had about a 20 pound weight gain over the last year but is not very active She does not complain of any weakness in her muscles She may tend to have some bruising of her skin  Because of her feeling fatigued she had multiple labs done in December by her PCP including morning and evening cortisol levels Her morning cortisol was normal at 16.4 and evening/4 PM cortisol was mildly increased at 12.8  ?  HYPERTENSION:  She says she has not been treated for hypertension in her blood pressure usually fairly good when she sees her PCP, last reading was 138/82 However she thinks that when she is more active or anxious her blood pressure will go up to as high as 200/99. At this time she also feels her pulses beating strongly but does not feel any palpitations or associated headaches or flushing At times she may feel hot flashes which is not new    Past Medical History:  Diagnosis Date  . Anxiety   . Depression   . HTN (hypertension)    resolved with weight loss s/p gastric bypass  . Hypothyroidism    Since at least 2007  . Osteoporosis   . RLS (restless legs syndrome)   . Sleep apnea   . Stroke [facility])    2013/February 2017    Past Surgical History:  Procedure Laterality Date  . CHOLECYSTECTOMY    . COLONOSCOPY  2004   internal hemorrhoids, left-sided diverticulae, splenic flexure polyp (79m), path unavailable at this time  . COLONOSCOPY  04/2011   RMR:  Internal and external hemorrhoids, rectal tubular adenoma removed, left-sided diverticulosis  . COLONOSCOPY N/A 06/01/2016   Procedure: COLONOSCOPY;  Surgeon: RDaneil Dolin MD;  Location: [facility];  Service: Endoscopy;  Laterality: N/A;  0830  . ESOPHAGOGASTRODUODENOSCOPY  2004   small hiatal hernia  . GASTRIC ROUX-EN-Y  2006    Family History  Problem Relation Age of Onset  . Lung cancer Father   . Stroke Father   . Stroke Mother   . Stroke Brother   . Hypertension Brother   . Stroke Maternal Grandmother   . Stroke Maternal Grandfather   . Cancer      family history   . Coronary artery disease      family history   . Arthritis      family history     Social History:  reports that she quit smoking about 29 years ago. She has never used smokeless tobacco. She reports that she does not drink alcohol or use drugs.  Allergies:    Allergies as of 12/19/2016      Reactions   Amoxicillin-pot Clavulanate Hives, Rash   Has patient had a PCN reaction causing immediate rash, facial/tongue/throat swelling, SOB or lightheadedness with hypotension:no Has patient had a PCN reaction causing severe rash involving mucus membranes or skin necrosis:no      Medication List  Accurate as of 12/19/16  3:07 PM. Always use your most recent med list.          ALPRAZolam 0.5 MG tablet Commonly known as:  XANAX Take 0.5 mg by mouth daily as needed. For anxiety and/or sleep   aspirin EC 81 MG tablet Take 81 mg by mouth daily.   atorvastatin 10 MG tablet Commonly known as:  LIPITOR Take 1 tablet (10 mg total) by mouth daily.   busPIRone 5 MG tablet Commonly known as:  BUSPAR Take 5 mg by mouth 2 (two) times daily.   CALCIUM 1200 PO Take 1,200 mg by mouth daily.   dexamethasone 1 MG tablet Commonly known as:  DECADRON Take 1 tablet at bedtime before the blood test   DULoxetine 30 MG capsule Commonly known as:  CYMBALTA Take 30 mg by mouth daily.   DULoxetine 60 MG  capsule Commonly known as:  CYMBALTA Take 60 mg by mouth daily.   HYDROcodone-acetaminophen 5-325 MG tablet Commonly known as:  NORCO/VICODIN Take one every 6 hours for pain not helped by tylenol   levothyroxine 137 MCG tablet Commonly known as:  SYNTHROID, LEVOTHROID Take 137 mcg by mouth daily before breakfast.   linaclotide 72 MCG capsule Commonly known as:  LINZESS Take 1 capsule (72 mcg total) by mouth daily before breakfast.   multivitamin with minerals Tabs tablet Take 1 tablet by mouth every morning.   rOPINIRole 0.25 MG tablet Commonly known as:  REQUIP Take 1 tablet (0.25 mg total) by mouth at bedtime.   VITAMIN B-12 PO Take 1 tablet by mouth every morning.   Vitamin D-3 1000 units Caps Take 1,000 Units by mouth daily.       LABS:  No visits with results within 1 Week(s) from this visit.  Latest known visit with results is:  Admission on 01/04/2016, Discharged on 01/05/2016  Component Date Value Ref Range Status  . Prothrombin Time 01/04/2016 13.9  11.6 - 15.2 seconds Final  . INR 01/04/2016 1.10  0.00 - 1.49 Final  . aPTT 01/04/2016 32  24 - 37 seconds Final  . WBC 01/04/2016 8.4  4.0 - 10.5 K/uL Final  . RBC 01/04/2016 4.60  3.87 - 5.11 MIL/uL Final  . Hemoglobin 01/04/2016 11.0* 12.0 - 15.0 g/dL Final  . HCT 01/04/2016 35.7* 36.0 - 46.0 % Final  . MCV 01/04/2016 77.6* 78.0 - 100.0 fL Final  . MCH 01/04/2016 23.9* 26.0 - 34.0 pg Final  . MCHC 01/04/2016 30.8  30.0 - 36.0 g/dL Final  . RDW 01/04/2016 17.1* 11.5 - 15.5 % Final  . Platelets 01/04/2016 365  150 - 400 K/uL Final  . Neutrophils Relative % 01/04/2016 53  % Final  . Neutro Abs 01/04/2016 4.4  1.7 - 7.7 K/uL Final  . Lymphocytes Relative 01/04/2016 31  % Final  . Lymphs Abs 01/04/2016 2.6  0.7 - 4.0 K/uL Final  . Monocytes Relative 01/04/2016 11  % Final  . Monocytes Absolute 01/04/2016 0.9  0.1 - 1.0 K/uL Final  . Eosinophils Relative 01/04/2016 6  % Final  . Eosinophils Absolute  01/04/2016 0.5  0.0 - 0.7 K/uL Final  . Basophils Relative 01/04/2016 1  % Final  . Basophils Absolute 01/04/2016 0.0  0.0 - 0.1 K/uL Final  . Sodium 01/04/2016 139  135 - 145 mmol/L Final  . Potassium 01/04/2016 3.8  3.5 - 5.1 mmol/L Final  . Chloride 01/04/2016 102  101 - 111 mmol/L Final  . CO2 01/04/2016 26  22 -  32 mmol/L Final  . Glucose, Bld 01/04/2016 100* 65 - 99 mg/dL Final  . BUN 01/04/2016 13  6 - 20 mg/dL Final  . Creatinine, Ser 01/04/2016 0.79  0.44 - 1.00 mg/dL Final  . Calcium 01/04/2016 9.6  8.9 - 10.3 mg/dL Final  . Total Protein 01/04/2016 7.9  6.5 - 8.1 g/dL Final  . Albumin 01/04/2016 4.0  3.5 - 5.0 g/dL Final  . AST 01/04/2016 24  15 - 41 U/L Final  . ALT 01/04/2016 18  14 - 54 U/L Final  . Alkaline Phosphatase 01/04/2016 174* 38 - 126 U/L Final  . Total Bilirubin 01/04/2016 0.7  0.3 - 1.2 mg/dL Final  . GFR calc non Af Amer 01/04/2016 >60  >60 mL/min Final  . GFR calc Af Amer 01/04/2016 >60  >60 mL/min Final   Comment: (NOTE) The eGFR has been calculated using the CKD EPI equation. This calculation has not been validated in all clinical situations. eGFR's persistently <60 mL/min signify possible Chronic Kidney Disease.   . Anion gap 01/04/2016 11  5 - 15 Final  . Troponin i, poc 01/04/2016 0.00  0.00 - 0.08 ng/mL Final  . Comment 3 01/04/2016          Final   Comment: Due to the release kinetics of cTnI, a negative result within the first hours of the onset of symptoms does not rule out myocardial infarction with certainty. If myocardial infarction is still suspected, repeat the test at appropriate intervals.   . Glucose-Capillary 01/04/2016 86  65 - 99 mg/dL Final  . Sodium 01/04/2016 139  135 - 145 mmol/L Final  . Potassium 01/04/2016 3.6  3.5 - 5.1 mmol/L Final  . Chloride 01/04/2016 100* 101 - 111 mmol/L Final  . BUN 01/04/2016 12  6 - 20 mg/dL Final  . Creatinine, Ser 01/04/2016 0.80  0.44 - 1.00 mg/dL Final  . Glucose, Bld 01/04/2016 95  65 - 99  mg/dL Final  . Calcium, Ion 01/04/2016 1.16  1.13 - 1.30 mmol/L Final  . TCO2 01/04/2016 27  0 - 100 mmol/L Final  . Hemoglobin 01/04/2016 12.9  12.0 - 15.0 g/dL Final  . HCT 01/04/2016 38.0  36.0 - 46.0 % Final  . Color, Urine 01/04/2016 YELLOW  YELLOW Final  . APPearance 01/04/2016 CLEAR  CLEAR Final  . Specific Gravity, Urine 01/04/2016 1.004* 1.005 - 1.030 Final  . pH 01/04/2016 7.0  5.0 - 8.0 Final  . Glucose, UA 01/04/2016 NEGATIVE  NEGATIVE mg/dL Final  . Hgb urine dipstick 01/04/2016 NEGATIVE  NEGATIVE Final  . Bilirubin Urine 01/04/2016 NEGATIVE  NEGATIVE Final  . Ketones, ur 01/04/2016 NEGATIVE  NEGATIVE mg/dL Final  . Protein, ur 01/04/2016 NEGATIVE  NEGATIVE mg/dL Final  . Nitrite 01/04/2016 NEGATIVE  NEGATIVE Final  . Leukocytes, UA 01/04/2016 SMALL* NEGATIVE Final  . Squamous Epithelial / LPF 01/04/2016 0-5* NONE SEEN Final  . WBC, UA 01/04/2016 6-30  0 - 5 WBC/hpf Final  . RBC / HPF 01/04/2016 0-5  0 - 5 RBC/hpf Final  . Bacteria, UA 01/04/2016 RARE* NONE SEEN Final  . Hgb A1c MFr Bld 01/05/2016 5.7* 4.8 - 5.6 % Final   Comment: (NOTE)         Pre-diabetes: 5.7 - 6.4         Diabetes: >6.4         Glycemic control for adults with diabetes: <7.0   . Mean Plasma Glucose 01/05/2016 117  mg/dL Final   Comment: (NOTE) Performed At:  [facility] [place], [place] [id number] Lindon Romp MD [id number]   . Cholesterol 01/05/2016 184  0 - 200 mg/dL Final  . Triglycerides 01/05/2016 65  <150 mg/dL Final  . HDL 01/05/2016 61  >40 mg/dL Final  . Total CHOL/HDL Ratio 01/05/2016 3.0  RATIO Final  . VLDL 01/05/2016 13  0 - 40 mg/dL Final  . LDL Cholesterol 01/05/2016 110* 0 - 99 mg/dL Final   Comment:        Total Cholesterol/HDL:CHD Risk Coronary Heart Disease Risk Table                     Men   Women  1/2 Average Risk   3.4   3.3  Average Risk       5.0   4.4  2 X Average Risk   9.6   7.1  3 X Average Risk  23.4   11.0        Use the  calculated Patient Ratio above and the CHD Risk Table to determine the patient's CHD Risk.        ATP III CLASSIFICATION (LDL):  <100     mg/dL   Optimal  100-129  mg/dL   Near or Above                    Optimal  130-159  mg/dL   Borderline  160-189  mg/dL   High  >190     mg/dL   Very High         Review of Systems  Constitutional: Positive for weight loss. Negative for reduced appetite.  HENT: Negative for headaches.   Eyes: Negative for visual disturbance.  Respiratory: Positive for daytime sleepiness. Negative for shortness of breath.   Cardiovascular: Positive for palpitations.       May feel increased pulsations when she is more active  Gastrointestinal: Negative for diarrhea.  Endocrine: Positive for fatigue. Negative for heat intolerance.  Genitourinary: Negative for frequency.  Musculoskeletal: Positive for joint pain and muscle aches.       She has occasional back pain, not new.  No history of fractures except remote history of right wrist fracture  Skin:       She has had increased facial hair especially under the chin for about 15 years, she thinks this is getting more prominent  Neurological: Negative for weakness.  Psychiatric/Behavioral: Positive for depressed mood.     PHYSICAL EXAM:  BP (!) 170/94   Pulse 92   Ht '5\' 4"'  (1.626 m)   Wt 209 lb (94.8 kg)   SpO2 95%   BMI 35.87 kg/m   GENERAL:She has generalized obesity. No Buffalo hump on the neck No supraclavicular fat pads or moon facies  No pallor, clubbing, lymphadenopathy or edema.   Skin:  no rash or pigmentation.  No purplish striae   EYES:  Externally normal.  Fundii: Exam not indicated  ENT: Oral mucosa and tongue normal.  THYROID:  Not palpable.  HEART:  Normal  S1 and S2; no murmur or click.  CHEST:  Normal shape.  Lungs: Vescicular breath sounds heard equally.  No crepitations/ wheeze.  ABDOMEN:  No distention.  Liver and spleen not palpable.  No other mass or  tenderness.  NEUROLOGICAL: .Reflexes are bilaterally normal at biceps Proximal muscle power appears normal in the legs  JOINTS:  Normal.   ASSESSMENT:    Mildly increased p.m. cortisol.  Clinically she has no features  of Cushing's diseaseWith no signs or symptoms typical of the disease.  Most likely her abnormal evening cortisol is related to persistent depression  Labile blood pressure occasional symptoms of hot flashes but no typical flushing.  Also no typical palpitations or headaches suggestive of pheochromocytoma.  However does need evaluation as blood pressure elevation may be more recent  Long-standing hirsutism of unclear etiology   PLAN:    24-hour fractionated urinary catecholamines  Dexamethasone suppression test  Will also check free and total testosterone and DHEA level today for her hirsutism although unlikely that she has an underlying cause and may be postmenopausal   Consultation note sent to the referring physician  [facility] 12/19/2016, 3:07 PM

## 2016-12-20 LAB — TESTOSTERONE, FREE, TOTAL, SHBG
Sex Hormone Binding: 54.5 nmol/L (ref 17.3–125.0)
TESTOSTERONE: 17 ng/dL (ref 3–41)
Testosterone, Free: 2.2 pg/mL (ref 0.0–4.2)

## 2016-12-20 LAB — DHEA-SULFATE: DHEA-SO4: 76.8 ug/dL (ref 20.4–186.6)

## 2016-12-20 NOTE — Progress Notes (Signed)
Please let patient know that the lab result is normal and no further action needed

## 2016-12-22 ENCOUNTER — Other Ambulatory Visit: Payer: Self-pay | Admitting: Neurology

## 2016-12-22 DIAGNOSIS — G2581 Restless legs syndrome: Secondary | ICD-10-CM

## 2016-12-22 DIAGNOSIS — G4733 Obstructive sleep apnea (adult) (pediatric): Secondary | ICD-10-CM

## 2016-12-22 DIAGNOSIS — Z9989 Dependence on other enabling machines and devices: Secondary | ICD-10-CM

## 2016-12-22 DIAGNOSIS — I63311 Cerebral infarction due to thrombosis of right middle cerebral artery: Secondary | ICD-10-CM

## 2017-01-03 ENCOUNTER — Ambulatory Visit: Payer: Medicare Other | Admitting: Cardiovascular Disease

## 2017-01-10 ENCOUNTER — Encounter: Payer: Self-pay | Admitting: Adult Health

## 2017-01-10 ENCOUNTER — Ambulatory Visit (INDEPENDENT_AMBULATORY_CARE_PROVIDER_SITE_OTHER): Payer: Medicare Other | Admitting: Adult Health

## 2017-01-10 VITALS — BP 156/97 | HR 84 | Ht 64.0 in | Wt 209.4 lb

## 2017-01-10 DIAGNOSIS — I63311 Cerebral infarction due to thrombosis of right middle cerebral artery: Secondary | ICD-10-CM | POA: Diagnosis not present

## 2017-01-10 DIAGNOSIS — G4733 Obstructive sleep apnea (adult) (pediatric): Secondary | ICD-10-CM

## 2017-01-10 DIAGNOSIS — Z9989 Dependence on other enabling machines and devices: Secondary | ICD-10-CM | POA: Diagnosis not present

## 2017-01-10 NOTE — Patient Instructions (Signed)
Continue using CPAP nightly If your symptoms worsen or you develop new symptoms please let us know.   

## 2017-01-10 NOTE — Progress Notes (Signed)
PATIENT: Virginia [name] DOB: 1946-02-18  REASON FOR VISIT: follow up HISTORY FROM: patient  HISTORY OF PRESENT ILLNESS: Today 01/10/2017: [name] is a 71 year old female with a history of obstructive sleep apnea on CPAP. She returns today for a compliance download. Her download indicates that she uses her machine 29 out of 30 days for compliance of 97%. She uses her machine greater than 4 hours 26 out of 30 days for compliance of 87%. Her residual AHI is 3.3 on a minimum pressure of 5cm water with maximum pressure of 10 cm of water. On average she uses her machine 6 hours and 34 minutes. The patient reports that she has been under a lot of stress in the last month. she reports that her brother passed away from a stroke and her daughter has been diagnosed with breast cancer. She states in the last 3 weeks she has not used her CPAP diligently. She has noticed more daytime sleepiness. She reports that she had a fall in January. Reports that she tripped and hit her head on a brick wall. She did have a CT scan that was relatively unremarkable. Reports that she had bruising on the left side of the face that has now resolved. She returns today for an evaluation.   HISTORY per Dr. Edwena Felty notes: Virginia [name] is a 71 y.o. female ,  seen here as a revisit  from [facility], who sees the patient for Stroke since August 2nd 2013 .   She has a CPAP for OSA treatment and is seen here for Compliance.  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 She is now on Medicare , and has needed to get new CPAP supplies.  In 2013 she developed left-sided hemiparesis involving face and arm predominantly. The left side was affected. She has returned to the current , normal level of function . In  October  2013 , the patient was tested for sleep apnea followed by a CPAP titration. PolySomnogram was ordered by Dr Jannifer Franklin to check for secondary risk for stroke . The patient had previously been diagnosed  with sleep apnea in 2006 , but at that time the RDI was 19 / hour and she had PLM related arousals  more frequently than apnea related events.  At that time,  she was also considered morbidly obese and a candidate for gastric  bypass surgery.  After she underwent weight loss surgery a weight loss of 100 pounds resulted . This also seemed to have resolved obstructive sleep apnea at least for a period of time , but over the years she had regained about 50 pounds and she noted a return of the symptoms , which brought her back to the sleep lab.  On 08-11-12  She underwent a diagnostic polysomnogram,  which documented an AHI of 17.9  and on 09-15-12 she was titrated successfully to CPAP and optimal titration device was ordered for the patient it seemed that she responded best to very low pressure such as 6 cm water.  A compliance download was obtained 2015 for a whole year. Her twelve-month download short periods during which the CPAP could not be used. The gap in 12/28/2012 resulted from a fall with a fracture of the  right wrist, the patient was unable to use the headgear and actually slept in a recliner. The patient lives with her psychiatrically ill daughter , who has ADHD, OCD claustrophobia and major depression. The second time period in October  2015 resulted  from a psychiatric break down after her brother's wedding party. Her daughter's daughter is 92 and also living with them. The patient was under a lot of stress and finally her daughter was committed.  She is her daughter's (44 y.o.)  main caretaker.   2015 CPAP compliance : The patient fulfills the CMS requirement , AHI is 3.0, and auto -titrator is not restricted- the pressure window is between 4 and 20 CM water?  72% compliance over 12 month . 95% percentile pressure is 9.6 cm water.  In the last 90 days, her compliance was over 85% . 97% CPAP compliance in 2013.   Virginia [name] brought me her CPAP machine , dated 12-01-14 and encompassed the  last 30 days.  The patient has a high compliance 93% of days of use and every single day all for 4 hours. The average time of use nightly is 8 hours 8 minutes the patient uses an auto Pap the minimum pressure is 5 and the maximum pressure 10 cm water at the 95% percentile she is at 9.7 cm water her AHI is 4.1 reviewing the grafts I noticed that the AHI is not that highly variable day by day and that the airleak doesn't play a role. In the 90 day download her AHI was 3.6 her compliance 93% again -every night over 4 hours 8 hours and 12 minutes on average, between 5 and 10 cm water out of  95% percentile here as well 9.7 cm water. She would like a little more air to start and a shorter RAMP , starting at 5 cm , The patient denies any discomfort with the mask fit, she does not swallow air or is air hungry, and she does not complain of any condensation water accumulating. Her Epworth sleepiness score is 6 points and her fatigue score 28 points. When I met her last year she was very depressed and I think she is now having a more happy outlook again.  History from 12/16/2015 [name] is here today for her regular yearly CPAP compliance visit. She is using an old machine, an[employer]scape by [employer]	[place],. She has been on this machine for 3-1/2 years. The patient shows a 90% compliance on today's download for over 4 hours of daily use average user time 6 hours 52 minutes, AHI is 3.2 which is in a good resolution. She uses a minimum pressure of 5 and a maximum pressure of 10 cm water the 91st percentile is 9.6.  The patient has endorsed the geriatric depression score at one point, the fatigue severity score at 17 and the Epworth sleepiness score at 7 points. She lost 8 pounds over the last 12 month. She reported having higher glucose levels. HbcA1c score reportedly rose to 5.9. She needs no medication, diet controlled. She believes that the Zocor may have negatively affected her blood sugar levels as well as her memory.  I'm not aware of the diabetic side effects but I am well aware of possible memory side effects as well as myopathy.   REVIEW OF SYSTEMS: Out of a complete 14 system review of symptoms, the patient complains only of the following symptoms, and all other reviewed systems are negative.  Aching muscles, agitation, depression, nervous/anxious, cold intolerance  ALLERGIES: Allergies  Allergen Reactions  . Amoxicillin-Pot Clavulanate Hives and Rash    Has patient had a PCN reaction causing immediate rash, facial/tongue/throat swelling, SOB or lightheadedness with hypotension:no Has patient had a PCN reaction causing severe rash involving mucus membranes or skin  necrosis:no      HOME MEDICATIONS: Outpatient Medications Prior to Visit  Medication Sig Dispense Refill  . ALPRAZolam (XANAX) 0.5 MG tablet Take 0.5 mg by mouth daily as needed. For anxiety and/or sleep    . aspirin EC 81 MG tablet Take 81 mg by mouth daily.    Marland Kitchen atorvastatin (LIPITOR) 10 MG tablet Take 1 tablet (10 mg total) by mouth daily. 30 tablet 1  . busPIRone (BUSPAR) 5 MG tablet Take 5 mg by mouth 2 (two) times daily.     . Calcium Carbonate-Vit D-Min (CALCIUM 1200 PO) Take 1,200 mg by mouth daily.    . Cholecalciferol (VITAMIN D-3) 1000 UNITS CAPS Take 1,000 Units by mouth daily.    . Cyanocobalamin (VITAMIN B-12 PO) Take 1 tablet by mouth every morning.    Marland Kitchen dexamethasone (DECADRON) 1 MG tablet Take 1 tablet at bedtime before the blood test 1 tablet 0  . DULoxetine (CYMBALTA) 30 MG capsule Take 30 mg by mouth daily.  3  . DULoxetine (CYMBALTA) 60 MG capsule Take 60 mg by mouth daily.    Marland Kitchen HYDROcodone-acetaminophen (NORCO/VICODIN) 5-325 MG tablet Take one every 6 hours for pain not helped by tylenol 12 tablet 0  . levothyroxine (SYNTHROID, LEVOTHROID) 137 MCG tablet Take 137 mcg by mouth daily before breakfast.    . linaclotide (LINZESS) 72 MCG capsule Take 1 capsule (72 mcg total) by mouth daily before breakfast. 30  capsule 3  . Multiple Vitamin (MULTIVITAMIN WITH MINERALS) TABS Take 1 tablet by mouth every morning.    Marland Kitchen rOPINIRole (REQUIP) 0.25 MG tablet TAKE 1 TABLET (0.25 MG TOTAL) BY MOUTH AT BEDTIME. 90 tablet 0   No facility-administered medications prior to visit.     PAST MEDICAL HISTORY: Past Medical History:  Diagnosis Date  . Anxiety   . Depression   . HTN (hypertension)    resolved with weight loss s/p gastric bypass  . Hypothyroidism    Since at least 2007  . Osteoporosis   . RLS (restless legs syndrome)   . Sleep apnea   . Stroke [facility])    2013/February 2017    PAST SURGICAL HISTORY: Past Surgical History:  Procedure Laterality Date  . CHOLECYSTECTOMY    . COLONOSCOPY  2004   internal hemorrhoids, left-sided diverticulae, splenic flexure polyp (49m), path unavailable at this time  . COLONOSCOPY  04/2011   RMR: Internal and external hemorrhoids, rectal tubular adenoma removed, left-sided diverticulosis  . COLONOSCOPY N/A 06/01/2016   Procedure: COLONOSCOPY;  Surgeon: RDaneil Dolin MD;  [facility];  Service: Endoscopy;  Laterality: N/A;  0830  . ESOPHAGOGASTRODUODENOSCOPY  2004   small hiatal hernia  . GASTRIC ROUX-EN-Y  2006    FAMILY HISTORY: Family History  Problem Relation Age of Onset  . Lung cancer Father   . Stroke Father   . Stroke Mother   . Stroke Brother   . Hypertension Brother   . Stroke Maternal Grandmother   . Stroke Maternal Grandfather   . Cancer      family history   . Coronary artery disease      family history   . Arthritis      family history     SOCIAL HISTORY: Social History   Social History  . Marital status: Divorced    Spouse name: N/A  . Number of children: 1  . Years of education: college   Occupational History  . retired    .  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 Smoking status: Former Smoker    Quit date: 11/28/1987  . Smokeless tobacco: Never Used     Comment: Quit x 25 plus years  . Alcohol use No    . Drug use: No  . Sexual activity: Not on file   Other Topics Concern  . Not on file   Social History Narrative   Caffeine 6 cups daily.  Retired/[employer],   2 yrs college,  Divorced, one daughter.      PHYSICAL EXAM  Vitals:   01/10/17 0926  BP: (!) 156/97  Pulse: 84  Weight: 209 lb 6.4 oz (95 kg)  Height: _0  (1.626 m)   Body mass index is 35.94 kg/m.  Generalized: Well developed, in no acute distress   Neurological examination  Mentation: Alert oriented to time, place, history taking. Follows all commands speech and language fluent Cranial nerve II-XII: Pupils were equal round reactive to light. Extraocular movements were full, visual field were full on confrontational test. Facial sensation and strength were normal. Uvula tongue midline. Head turning and shoulder shrug  were normal and symmetric. Motor: The motor testing reveals 5 over 5 strength of all 4 extremities. Good symmetric motor tone is noted throughout.  Sensory: Sensory testing is intact to soft touch on all 4 extremities. No evidence of extinction is noted.  Coordination: Cerebellar testing reveals good finger-nose-finger and heel-to-shin bilaterally.  Gait and station: Gait is normal.  Reflexes: Deep tendon reflexes are symmetric and normal bilaterally.   DIAGNOSTIC DATA (LABS, IMAGING, TESTING) - I reviewed patient records, labs, notes, testing and imaging myself where available.  Lab Results  Component Value Date   WBC 8.4 01/04/2016   HGB 12.9 01/04/2016   HCT 38.0 01/04/2016   MCV 77.6 (L) 01/04/2016   PLT 365 01/04/2016      Component Value Date/Time   NA 139 01/04/2016 1653   K 3.6 01/04/2016 1653   CL 100 (L) 01/04/2016 1653   CO2 26 01/04/2016 1632   GLUCOSE 95 01/04/2016 1653   BUN 12 01/04/2016 1653   CREATININE 0.80 01/04/2016 1653   CALCIUM 9.6 01/04/2016 1632   PROT 7.9 01/04/2016 1632   ALBUMIN 4.0 01/04/2016 1632   AST 24 01/04/2016 1632   ALT 18 01/04/2016 1632    ALKPHOS 174 (H) 01/04/2016 1632   BILITOT 0.7 01/04/2016 1632   GFRNONAA >60 01/04/2016 1632   GFRAA >60 01/04/2016 1632   Lab Results  Component Value Date   CHOL 184 01/05/2016   HDL 61 01/05/2016   LDLCALC 110 (H) 01/05/2016   TRIG 65 01/05/2016   CHOLHDL 3.0 01/05/2016   Lab Results  Component Value Date   HGBA1C 5.7 (H) 01/05/2016     ASSESSMENT AND PLAN 71 y.o. year old female  has a past medical history of Anxiety; Depression; HTN (hypertension); Hypothyroidism; Osteoporosis; RLS (restless legs syndrome); Sleep apnea; and Stroke ([place]). here with:  1. Obstructive sleep apnea on CPAP  Overall the patient download shows excellent compliance and good treatment of her apnea. She is encouraged to use her CPAP nightly. Advised that if her symptoms worsen or she develops new symptoms she should let us know. Will follow-up in one year with Dr. Brett Fairy  I spent 15 minutes with the patient 50% of this time was spent reviewing her CPAP download.  Ward Givens, MSN, NP-C 01/10/2017, 9:25 AM [facility] [address] [place],  [address] [phone]

## 2017-01-11 NOTE — Progress Notes (Signed)
I agree with the assessment and plan as directed by NP .The patient is known to me .   Mavrick Mcquigg, MD  

## 2017-01-16 ENCOUNTER — Encounter: Payer: Self-pay | Admitting: Cardiovascular Disease

## 2017-01-16 ENCOUNTER — Ambulatory Visit (INDEPENDENT_AMBULATORY_CARE_PROVIDER_SITE_OTHER): Payer: Medicare Other | Admitting: Cardiovascular Disease

## 2017-01-16 VITALS — BP 159/92 | HR 83 | Ht 64.0 in | Wt 209.0 lb

## 2017-01-16 DIAGNOSIS — G4733 Obstructive sleep apnea (adult) (pediatric): Secondary | ICD-10-CM | POA: Diagnosis not present

## 2017-01-16 DIAGNOSIS — I10 Essential (primary) hypertension: Secondary | ICD-10-CM

## 2017-01-16 DIAGNOSIS — Z9989 Dependence on other enabling machines and devices: Secondary | ICD-10-CM

## 2017-01-16 DIAGNOSIS — R0989 Other specified symptoms and signs involving the circulatory and respiratory systems: Secondary | ICD-10-CM

## 2017-01-16 DIAGNOSIS — R0602 Shortness of breath: Secondary | ICD-10-CM | POA: Diagnosis not present

## 2017-01-16 DIAGNOSIS — Z8673 Personal history of transient ischemic attack (TIA), and cerebral infarction without residual deficits: Secondary | ICD-10-CM | POA: Diagnosis not present

## 2017-01-16 DIAGNOSIS — R5383 Other fatigue: Secondary | ICD-10-CM

## 2017-01-16 MED ORDER — VALSARTAN 80 MG PO TABS: 80.0000 mg | ORAL_TABLET | Freq: Every day | ORAL | 3 refills | Status: DC

## 2017-01-16 NOTE — Progress Notes (Signed)
CARDIOLOGY CONSULT NOTE  Patient ID: MAREESA GATHRIGHT MRN: [id number] DOB/AGE: [birth year] 71 y.o.  Admit date: (Not on file) Primary Physician: [name], MD Referring Physician:   Reason for Consultation: labile HTN, fatigue  HPI: The patient is a 71 year old woman with a history of hyperlipidemia, TIA (hospitalized for this in 12/2015), labile hypertension, OSA on CPAP, and anxiety referred for fatigue and labile blood pressures.  Blood tests 10/2016: Hgb 12.9, platelets 339, K 4.1, creatinine 0.85, BUN 7, ESR 47, TSH 2.56.  Echocardiogram 01/05/16: Normal LV systolic function, EF 14-97%, normal regional wall motion, grade 1 diastolic dysfunction.  ECG performed in the office today which I personally reviewed demonstrates normal sinus rhythm with no ischemic ST segment or T-wave abnormalities, nor any arrhythmias.  She said she is very anxious and stressed out. Brother recently passed away after multiple CVA's at age 40. Her daughter was recently diagnosed with breast cancer.  She denies exertional chest pain. Does have exertional dyspnea with activities such as vacuuming and raking leaves. Says SBP gets over 200 mmHg with these activities. Has occasional GERD relieved with Tums.   Said she's had 2 CVA's. Was told by ophthalmologist she has sustained "eye damage due to high blood pressure".  BP today 159/92.    Allergies  Allergen Reactions  . Amoxicillin-Pot Clavulanate Hives and Rash    Has patient had a PCN reaction causing immediate rash, facial/tongue/throat swelling, SOB or lightheadedness with hypotension:no Has patient had a PCN reaction causing severe rash involving mucus membranes or skin necrosis:no      Current Outpatient Prescriptions  Medication Sig Dispense Refill  . ALPRAZolam (XANAX) 0.5 MG tablet Take 0.5 mg by mouth daily as needed. For anxiety and/or sleep    . aspirin EC 81 MG tablet Take 81 mg by mouth daily.    Marland Kitchen atorvastatin  (LIPITOR) 10 MG tablet Take 1 tablet (10 mg total) by mouth daily. 30 tablet 1  . b complex vitamins tablet Take 1 tablet by mouth daily.    . busPIRone (BUSPAR) 5 MG tablet Take 5 mg by mouth 2 (two) times daily.     . Calcium Carbonate-Vit D-Min (CALCIUM 1200 PO) Take 1,200 mg by mouth daily.    . Cholecalciferol (VITAMIN D-3) 1000 UNITS CAPS Take 1,000 Units by mouth daily.    . Cyanocobalamin (VITAMIN B-12 PO) Take 1 tablet by mouth every morning.    . DULoxetine (CYMBALTA) 30 MG capsule Take 30 mg by mouth daily.  3  . DULoxetine (CYMBALTA) 60 MG capsule Take 60 mg by mouth daily.    Marland Kitchen levothyroxine (SYNTHROID, LEVOTHROID) 137 MCG tablet Take 137 mcg by mouth daily before breakfast.    . Multiple Vitamin (MULTIVITAMIN WITH MINERALS) TABS Take 1 tablet by mouth every morning.    Marland Kitchen rOPINIRole (REQUIP) 0.25 MG tablet TAKE 1 TABLET (0.25 MG TOTAL) BY MOUTH AT BEDTIME. 90 tablet 0   No current facility-administered medications for this visit.     Past Medical History:  Diagnosis Date  . Anxiety   . Depression   . HTN (hypertension)    resolved with weight loss s/p gastric bypass  . Hypothyroidism    Since at least 2007  . Osteoporosis   . RLS (restless legs syndrome)   . Sleep apnea   . Stroke [facility])    2013/February 2017    Past Surgical History:  Procedure Laterality Date  . CHOLECYSTECTOMY    . COLONOSCOPY  2004  internal hemorrhoids, left-sided diverticulae, splenic flexure polyp (60m), path unavailable at this time  . COLONOSCOPY  04/2011   RMR: Internal and external hemorrhoids, rectal tubular adenoma removed, left-sided diverticulosis  . COLONOSCOPY N/A 06/01/2016   Procedure: COLONOSCOPY;  Surgeon: RDaneil Dolin MD;  Location[facility];  Service: Endoscopy;  Laterality: N/A;  0830  . ESOPHAGOGASTRODUODENOSCOPY  2004   small hiatal hernia  . GASTRIC ROUX-EN-Y  2006    Social History   Social History  . Marital status: Divorced    Spouse name: N/A  . Number  of children: 1  . Years of education: college   Occupational History  . retired    .  Retired   Social History Main Topics  . Smoking status: Former Smoker    Quit date: 11/28/1987  . Smokeless tobacco: Never Used     Comment: Quit x 25 plus years  . Alcohol use No  . Drug use: No  . Sexual activity: Not on file   Other Topics Concern  . Not on file   Social History Narrative   Caffeine 6 cups daily.  Retired/[employer],   2 yrs college,  Divorced, one daughter.     No family history of premature CAD in 1st degree relatives.  Prior to Admission medications   Medication Sig Start Date End Date Taking? Authorizing Provider  ALPRAZolam (Duanne Moron 0.5 MG tablet Take 0.5 mg by mouth daily as needed. For anxiety and/or sleep 03/27/11   Historical Provider, MD  aspirin EC 81 MG tablet Take 81 mg by mouth daily.    Historical Provider, MD  atorvastatin (LIPITOR) 10 MG tablet Take 1 tablet (10 mg total) by mouth daily. 01/05/16   SAnnita Brod MD  b complex vitamins tablet Take 1 tablet by mouth daily.    Historical Provider, MD  busPIRone (BUSPAR) 5 MG tablet Take 5 mg by mouth 2 (two) times daily.  04/30/16   Historical Provider, MD  Calcium Carbonate-Vit D-Min (CALCIUM 1200 PO) Take 1,200 mg by mouth daily.    Historical Provider, MD  Cholecalciferol (VITAMIN D-3) 1000 UNITS CAPS Take 1,000 Units by mouth daily.    Historical Provider, MD  Cyanocobalamin (VITAMIN B-12 PO) Take 1 tablet by mouth every morning.    Historical Provider, MD  dexamethasone (DECADRON) 1 MG tablet Take 1 tablet at bedtime before the blood test Patient not taking: Reported on 01/10/2017 12/19/16   AElayne Snare MD  DULoxetine (CYMBALTA) 30 MG capsule Take 30 mg by mouth daily. 12/01/15   Historical Provider, MD  DULoxetine (CYMBALTA) 60 MG capsule Take 60 mg by mouth daily.    Historical Provider, MD  levothyroxine (SYNTHROID, LEVOTHROID) 137 MCG tablet Take 137 mcg by mouth daily before breakfast.    Historical  Provider, MD  Multiple Vitamin (MULTIVITAMIN WITH MINERALS) TABS Take 1 tablet by mouth every morning.    Historical Provider, MD  rOPINIRole (REQUIP) 0.25 MG tablet TAKE 1 TABLET (0.25 MG TOTAL) BY MOUTH AT BEDTIME. 12/25/16   CLarey Seat MD     Review of systems complete and found to be negative unless listed above in HPI     Physical exam Blood pressure (!) 159/92, pulse 83, height '5\' 4"'  (1.626 m), weight 209 lb (94.8 kg), SpO2 98 %. General: NAD, anxious Neck: No JVD, no thyromegaly or thyroid nodule.  Lungs: Clear to auscultation bilaterally with normal respiratory effort. CV: Nondisplaced PMI. Regular rate and rhythm, normal S1/S2, no S3/S4, no murmur.  No  peripheral edema.  No carotid bruit.  Abdomen: Soft, nontender, obese, no distention.  Skin: Intact without lesions or rashes.  Neurologic: Alert and oriented x 3.  Psych: Anxious, tearful. Extremities: No clubbing or cyanosis.  HEENT: Normal.   ECG: Most recent ECG reviewed.  Labs:   Lab Results  Component Value Date   WBC 8.4 01/04/2016   HGB 12.9 01/04/2016   HCT 38.0 01/04/2016   MCV 77.6 (L) 01/04/2016   PLT 365 01/04/2016   No results for input(s): NA, K, CL, CO2, BUN, CREATININE, CALCIUM, PROT, BILITOT, ALKPHOS, ALT, AST, GLUCOSE in the last 168 hours.  Invalid input(s): LABALBU Lab Results  Component Value Date   CKTOTAL 88 06/28/2012   CKMB 2.0 06/28/2012   TROPONINI <0.30 06/28/2012    Lab Results  Component Value Date   CHOL 184 01/05/2016   CHOL 183 06/29/2012   Lab Results  Component Value Date   HDL 61 01/05/2016   HDL 59 06/29/2012   Lab Results  Component Value Date   LDLCALC 110 (H) 01/05/2016   LDLCALC 107 (H) 06/29/2012   Lab Results  Component Value Date   TRIG 65 01/05/2016   TRIG 87 06/29/2012   Lab Results  Component Value Date   CHOLHDL 3.0 01/05/2016   CHOLHDL 3.1 06/29/2012   No results found for: LDLDIRECT       Studies: No results found.  ASSESSMENT  AND PLAN:  1. Labile hypertension: Given h/o 2 CVA's, she needs optimal BP control. Will start Diovan 80 mg daily. I've asked her to monitor BP 3-4 times per week for the next several weeks and to inform me of these values.  2. Shortness of breath and fatigue: I will proceed with a nuclear myocardial perfusion imaging study to evaluate for ischemic heart disease (exercise Myoview). Will see if she has an exaggerated BP response with addition of Diovan 80 mg.  3. OSA: On CPAP.  4. Hyperlipidemia: On Lipitor.  5. History of CVA's: On ASA 81 mg.   Dispo: fu 4-6 weeks   Signed: Kate Sable, M.D., F.A.C.C.  01/16/2017, 2:26 PM

## 2017-01-16 NOTE — Patient Instructions (Addendum)
Your physician recommends that you schedule a follow-up appointment in: 4-6 weeks Dr Bronson Ing  START Diovan 80 mg daily   Your physician has requested that you have en exercise stress myoview. For further information please visit [email or web address]. Please follow instruction sheet, as given.   Take your blood pressure 3 times a week and bring back at next apt so MD can look at      Thank you for choosing [facility] !

## 2017-01-23 ENCOUNTER — Encounter (HOSPITAL_COMMUNITY)
Admission: RE | Admit: 2017-01-23 | Discharge: 2017-01-23 | Disposition: A | Discharge status: Home / Self Care | Payer: Medicare Other | Source: Ambulatory Visit | Attending: Cardiovascular Disease | Admitting: Cardiovascular Disease

## 2017-01-23 ENCOUNTER — Inpatient Hospital Stay (HOSPITAL_COMMUNITY): Admission: RE | Admit: 2017-01-23 | Payer: Medicare Other | Source: Ambulatory Visit

## 2017-01-23 ENCOUNTER — Encounter (HOSPITAL_COMMUNITY): Payer: Self-pay

## 2017-01-23 DIAGNOSIS — I510 Cardiac septal defect, acquired: Secondary | ICD-10-CM | POA: Insufficient documentation

## 2017-01-23 DIAGNOSIS — R0602 Shortness of breath: Secondary | ICD-10-CM | POA: Insufficient documentation

## 2017-01-23 DIAGNOSIS — R5383 Other fatigue: Secondary | ICD-10-CM | POA: Insufficient documentation

## 2017-01-23 LAB — NM MYOCAR MULTI W/SPECT W/WALL MOTION / EF
CHL CUP NUCLEAR SRS: 0
CHL CUP RESTING HR STRESS: 77 {beats}/min
CHL RATE OF PERCEIVED EXERTION: 15
CSEPEW: 4.6 METS
Exercise duration (min): 2 min
Exercise duration (sec): 57 s
LVDIAVOL: 76 mL (ref 46–106)
LVSYSVOL: 26 mL
MPHR: 152 {beats}/min
NUC STRESS TID: 1.13
Peak HR: 141 {beats}/min
Percent HR: 92 %
RATE: 1.13
SDS: 3
SSS: 3

## 2017-01-23 MED ORDER — TECHNETIUM TC 99M TETROFOSMIN IV KIT
30.0000 | PACK | Freq: Once | INTRAVENOUS | Status: AC | PRN
  Administered 2017-01-23: 30 via INTRAVENOUS

## 2017-01-23 MED ORDER — TECHNETIUM TC 99M TETROFOSMIN IV KIT
10.0000 | PACK | Freq: Once | INTRAVENOUS | Status: AC | PRN
  Administered 2017-01-23: 10 via INTRAVENOUS

## 2017-01-23 MED ORDER — SODIUM CHLORIDE 0.9% FLUSH
INTRAVENOUS | Status: AC
  Filled 2017-01-23: qty 10

## 2017-01-23 MED ORDER — REGADENOSON 0.4 MG/5ML IV SOLN
INTRAVENOUS | Status: AC
  Filled 2017-01-23: qty 5

## 2017-02-06 ENCOUNTER — Other Ambulatory Visit: Payer: Medicare Other

## 2017-02-06 ENCOUNTER — Other Ambulatory Visit: Payer: Self-pay | Admitting: Endocrinology

## 2017-02-06 DIAGNOSIS — R0989 Other specified symptoms and signs involving the circulatory and respiratory systems: Secondary | ICD-10-CM

## 2017-02-06 DIAGNOSIS — R7989 Other specified abnormal findings of blood chemistry: Secondary | ICD-10-CM

## 2017-02-06 DIAGNOSIS — I10 Essential (primary) hypertension: Secondary | ICD-10-CM | POA: Diagnosis not present

## 2017-02-06 NOTE — Addendum Note (Signed)
Addended by: Kaylyn Lim I on: 02/06/2017 01:58 PM   Modules accepted: Orders

## 2017-02-07 LAB — CREATININE, URINE, 24 HOUR
CREATININE 24H UR: 924 mg/(24.h) (ref 800–1800)
Creatinine, Urine: 37.7 mg/dL

## 2017-02-09 ENCOUNTER — Telehealth: Payer: Self-pay | Admitting: Endocrinology

## 2017-02-09 NOTE — Telephone Encounter (Signed)
Please call in the cortisol pill per pt's discussion with the MD. For the pt to have the cortisol test done here, please call [facility] [place]

## 2017-02-10 LAB — CATECHOLAMINES, FRACTIONATED, URINE, 24 HOUR
DOPAMINE RANDOM UR: 75 ug/L
Dopamine , 24H Ur: 184 ug/24 hr (ref 0–510)
Epinephrine, 24H Ur: <2 ug/24 hr (ref 0–20)
NOREPINEPH RAND UR: 22 ug/L
NOREPINEPHRINE 24H UR: 54 ug/(24.h) (ref 0–135)

## 2017-02-12 ENCOUNTER — Other Ambulatory Visit: Payer: Self-pay | Admitting: Endocrinology

## 2017-02-12 MED ORDER — DEXAMETHASONE 1 MG PO TABS: ORAL_TABLET | ORAL | 0 refills | Status: DC

## 2017-02-12 NOTE — Telephone Encounter (Signed)
Sent!

## 2017-02-28 ENCOUNTER — Encounter: Payer: Self-pay | Admitting: Cardiovascular Disease

## 2017-02-28 ENCOUNTER — Ambulatory Visit (INDEPENDENT_AMBULATORY_CARE_PROVIDER_SITE_OTHER): Payer: Medicare Other | Admitting: Cardiovascular Disease

## 2017-02-28 VITALS — BP 126/62 | HR 90 | Ht 64.0 in | Wt 206.0 lb

## 2017-02-28 DIAGNOSIS — I10 Essential (primary) hypertension: Secondary | ICD-10-CM

## 2017-02-28 DIAGNOSIS — R002 Palpitations: Secondary | ICD-10-CM | POA: Diagnosis not present

## 2017-02-28 DIAGNOSIS — G4733 Obstructive sleep apnea (adult) (pediatric): Secondary | ICD-10-CM

## 2017-02-28 DIAGNOSIS — R0602 Shortness of breath: Secondary | ICD-10-CM

## 2017-02-28 DIAGNOSIS — Z8673 Personal history of transient ischemic attack (TIA), and cerebral infarction without residual deficits: Secondary | ICD-10-CM

## 2017-02-28 DIAGNOSIS — R5383 Other fatigue: Secondary | ICD-10-CM

## 2017-02-28 DIAGNOSIS — I63311 Cerebral infarction due to thrombosis of right middle cerebral artery: Secondary | ICD-10-CM

## 2017-02-28 DIAGNOSIS — Z9989 Dependence on other enabling machines and devices: Secondary | ICD-10-CM | POA: Diagnosis not present

## 2017-02-28 DIAGNOSIS — R0989 Other specified symptoms and signs involving the circulatory and respiratory systems: Secondary | ICD-10-CM

## 2017-02-28 MED ORDER — VALSARTAN 160 MG PO TABS: 160.0000 mg | ORAL_TABLET | Freq: Every day | ORAL | 3 refills | Status: DC

## 2017-02-28 NOTE — Patient Instructions (Signed)
Medication Instructions:  INCREASE DIOVAN TO 160 MG DAILY   Labwork: NONE  Testing/Procedures: NONE  Follow-Up: Your physician recommends that you schedule a follow-up appointment in: 1 MONTH    Any Other Special Instructions Will Be Listed Below (If Applicable).  PLEASE CONTINUE TAKING YOUR BLOOD PRESSURE AND BRING THE READINGS WITH YOU TO THE NEXT APPOINTMENT    If you need a refill on your cardiac medications before your next appointment, please call your pharmacy.

## 2017-02-28 NOTE — Progress Notes (Signed)
SUBJECTIVE: The patient returns for follow-up after undergoing cardiovascular testing performed for the evaluation of shortness of breath and fatigue.  Underwent a low risk nuclear stress test 01/23/17. There was a hypertensive response to exercise.  I started Diovan 80 mg daily at her last visit. This is her only antihypertensive medication at present. I asked her to monitor her blood pressures at home and they have remained markedly elevated to as high as 189/90. She continues to feel fatigued. She denies chest pain.   She had an episode of rapid palpitations the other day while driving to work which made her nauseous. This happens sporadically and not even once a month.   Review of Systems: As per "subjective", otherwise negative.  Allergies  Allergen Reactions  . Amoxicillin-Pot Clavulanate Hives and Rash    Has patient had a PCN reaction causing immediate rash, facial/tongue/throat swelling, SOB or lightheadedness with hypotension:no Has patient had a PCN reaction causing severe rash involving mucus membranes or skin necrosis:no      Current Outpatient Prescriptions  Medication Sig Dispense Refill  . ALPRAZolam (XANAX) 0.5 MG tablet Take 0.5 mg by mouth daily as needed. For anxiety and/or sleep    . aspirin EC 81 MG tablet Take 81 mg by mouth daily.    Marland Kitchen atorvastatin (LIPITOR) 10 MG tablet Take 1 tablet (10 mg total) by mouth daily. 30 tablet 1  . b complex vitamins tablet Take 1 tablet by mouth daily.    . busPIRone (BUSPAR) 5 MG tablet Take 5 mg by mouth 2 (two) times daily.     . Calcium Carbonate-Vit D-Min (CALCIUM 1200 PO) Take 1,200 mg by mouth daily.    . Cholecalciferol (VITAMIN D-3) 1000 UNITS CAPS Take 1,000 Units by mouth daily.    . Cyanocobalamin (VITAMIN B-12 PO) Take 1 tablet by mouth every morning.    Marland Kitchen dexamethasone (DECADRON) 1 MG tablet Take 1 tablet at bedtime before the blood test 1 tablet 0  . DULoxetine (CYMBALTA) 30 MG capsule Take 30 mg by mouth  daily.  3  . DULoxetine (CYMBALTA) 60 MG capsule Take 60 mg by mouth daily.    Marland Kitchen levothyroxine (SYNTHROID, LEVOTHROID) 137 MCG tablet Take 137 mcg by mouth daily before breakfast.    . Multiple Vitamin (MULTIVITAMIN WITH MINERALS) TABS Take 1 tablet by mouth every morning.    Marland Kitchen rOPINIRole (REQUIP) 0.25 MG tablet TAKE 1 TABLET (0.25 MG TOTAL) BY MOUTH AT BEDTIME. 90 tablet 0  . valsartan (DIOVAN) 80 MG tablet Take 1 tablet (80 mg total) by mouth daily. 90 tablet 3   No current facility-administered medications for this visit.     Past Medical History:  Diagnosis Date  . Anxiety   . Depression   . HTN (hypertension)    resolved with weight loss s/p gastric bypass  . Hypothyroidism    Since at least 2007  . Osteoporosis   . RLS (restless legs syndrome)   . Sleep apnea   . Stroke [facility])    2013/February 2017    Past Surgical History:  Procedure Laterality Date  . CHOLECYSTECTOMY    . COLONOSCOPY  2004   internal hemorrhoids, left-sided diverticulae, splenic flexure polyp (43mm), path unavailable at this time  . COLONOSCOPY  04/2011   RMR: Internal and external hemorrhoids, rectal tubular adenoma removed, left-sided diverticulosis  . COLONOSCOPY N/A 06/01/2016   Procedure: COLONOSCOPY;  Surgeon: Daneil Dolin, MD;  Location: [facility];  Service: Endoscopy;  Laterality:  N/A;  0830  . ESOPHAGOGASTRODUODENOSCOPY  2004   small hiatal hernia  . GASTRIC ROUX-EN-Y  2006    Social History   Social History  . Marital status: Divorced    Spouse name: N/A  . Number of children: 1  . Years of education: college   Occupational History  . retired    .  Retired   Social History Main Topics  . Smoking status: Former Smoker    Quit date: 11/28/1987  . Smokeless tobacco: Never Used     Comment: Quit x 25 plus years  . Alcohol use No  . Drug use: No  . Sexual activity: Not on file   Other Topics Concern  . Not on file   Social History Narrative   Caffeine 6 cups daily.   Retired/[employer],   2 yrs college,  Divorced, one daughter.     Vitals:   02/28/17 1322  BP: 126/62  Pulse: 90  SpO2: 95%  Weight: 206 lb (93.4 kg)  Height: 5\' 4"  (1.626 m)    PHYSICAL EXAM General: NAD HEENT: Normal. Neck: No JVD, no thyromegaly. Lungs: Clear to auscultation bilaterally with normal respiratory effort. CV: Nondisplaced PMI.  Regular rate and rhythm, normal S1/S2, no S3/S4, no murmur. No pretibial or periankle edema.   Abdomen: Soft, nontender, no distention.  Neurologic: Alert and oriented.  Psych: Normal affect. Skin: Normal. Musculoskeletal: No gross deformities.    ECG: Most recent ECG reviewed.      ASSESSMENT AND PLAN: 1. Labile hypertension: Blood pressure remains markedly elevated and she demonstrated a hypertensive response to exercise during stress testing. I will increase Diovan to 160 mg daily. I have asked her to continue to monitor her blood pressures.  2. Shortness of breath and fatigue: Low risk nuclear stress test as detailed above. She demonstrated a hypertensive response exercise. I will increase Diovan to 160 mg daily. This may be the etiology of her symptoms.  3. Obstructive sleep apnea: Continues on CPAP.  4. Hyperlipidemia: Remains on Lipitor. No changes.  5. History of CVAs: Continue aspirin 81 mg.  6. Palpitations: Sporadic in nature. Cardiac monitoring not required at this time. I would consider labetalol as an additional antihypertensive agent in the future.  Disposition: Follow-up 1 month.   Kate Sable, M.D., F.A.C.C.

## 2017-03-19 DIAGNOSIS — H2512 Age-related nuclear cataract, left eye: Secondary | ICD-10-CM | POA: Diagnosis not present

## 2017-03-19 DIAGNOSIS — H25012 Cortical age-related cataract, left eye: Secondary | ICD-10-CM | POA: Diagnosis not present

## 2017-03-19 DIAGNOSIS — H25013 Cortical age-related cataract, bilateral: Secondary | ICD-10-CM | POA: Diagnosis not present

## 2017-03-19 DIAGNOSIS — H2513 Age-related nuclear cataract, bilateral: Secondary | ICD-10-CM | POA: Diagnosis not present

## 2017-03-19 DIAGNOSIS — H35372 Puckering of macula, left eye: Secondary | ICD-10-CM | POA: Diagnosis not present

## 2017-03-19 DIAGNOSIS — H35342 Macular cyst, hole, or pseudohole, left eye: Secondary | ICD-10-CM | POA: Diagnosis not present

## 2017-03-19 DIAGNOSIS — H35033 Hypertensive retinopathy, bilateral: Secondary | ICD-10-CM | POA: Diagnosis not present

## 2017-03-20 ENCOUNTER — Other Ambulatory Visit: Payer: Self-pay | Admitting: Neurology

## 2017-03-20 DIAGNOSIS — G4733 Obstructive sleep apnea (adult) (pediatric): Secondary | ICD-10-CM

## 2017-03-20 DIAGNOSIS — I63311 Cerebral infarction due to thrombosis of right middle cerebral artery: Secondary | ICD-10-CM

## 2017-03-20 DIAGNOSIS — Z9989 Dependence on other enabling machines and devices: Secondary | ICD-10-CM

## 2017-03-20 DIAGNOSIS — G2581 Restless legs syndrome: Secondary | ICD-10-CM

## 2017-03-27 DIAGNOSIS — H52222 Regular astigmatism, left eye: Secondary | ICD-10-CM | POA: Diagnosis not present

## 2017-03-27 DIAGNOSIS — H2512 Age-related nuclear cataract, left eye: Secondary | ICD-10-CM | POA: Diagnosis not present

## 2017-03-27 DIAGNOSIS — H25812 Combined forms of age-related cataract, left eye: Secondary | ICD-10-CM | POA: Diagnosis not present

## 2017-04-11 ENCOUNTER — Encounter: Payer: Self-pay | Admitting: Cardiovascular Disease

## 2017-04-11 ENCOUNTER — Ambulatory Visit (INDEPENDENT_AMBULATORY_CARE_PROVIDER_SITE_OTHER): Payer: Medicare Other | Admitting: Cardiovascular Disease

## 2017-04-11 VITALS — BP 126/74 | HR 78 | Ht 64.0 in | Wt 208.0 lb

## 2017-04-11 DIAGNOSIS — R5383 Other fatigue: Secondary | ICD-10-CM

## 2017-04-11 DIAGNOSIS — Z9989 Dependence on other enabling machines and devices: Secondary | ICD-10-CM | POA: Diagnosis not present

## 2017-04-11 DIAGNOSIS — Z7182 Exercise counseling: Secondary | ICD-10-CM

## 2017-04-11 DIAGNOSIS — Z8673 Personal history of transient ischemic attack (TIA), and cerebral infarction without residual deficits: Secondary | ICD-10-CM

## 2017-04-11 DIAGNOSIS — G4733 Obstructive sleep apnea (adult) (pediatric): Secondary | ICD-10-CM

## 2017-04-11 DIAGNOSIS — R0602 Shortness of breath: Secondary | ICD-10-CM

## 2017-04-11 DIAGNOSIS — R0989 Other specified symptoms and signs involving the circulatory and respiratory systems: Secondary | ICD-10-CM

## 2017-04-11 DIAGNOSIS — I63311 Cerebral infarction due to thrombosis of right middle cerebral artery: Secondary | ICD-10-CM

## 2017-04-11 DIAGNOSIS — R002 Palpitations: Secondary | ICD-10-CM

## 2017-04-11 NOTE — Progress Notes (Signed)
SUBJECTIVE: The patient presents for follow-up of labile hypertension, shortness of breath, fatigue, and palpitations.  She denies chest pain, shortness of breath, and further episodes of palpitations, she feels less fatigued.  After increasing the dose of valsartan, her blood pressures have overall remain well controlled. I reviewed her blood pressure log. She had isolated systolic readings of 086, 160, 146, and 153.  She now plans to begin walking. We talked about exercise for the primary prevention of cardiovascular disease.   Review of Systems: As per "subjective", otherwise negative.  Allergies  Allergen Reactions  . Amoxicillin-Pot Clavulanate Hives and Rash    Has patient had a PCN reaction causing immediate rash, facial/tongue/throat swelling, SOB or lightheadedness with hypotension:no Has patient had a PCN reaction causing severe rash involving mucus membranes or skin necrosis:no      Current Outpatient Prescriptions  Medication Sig Dispense Refill  . ALPRAZolam (XANAX) 0.5 MG tablet Take 0.5 mg by mouth daily as needed. For anxiety and/or sleep    . aspirin EC 81 MG tablet Take 81 mg by mouth daily.    Marland Kitchen atorvastatin (LIPITOR) 10 MG tablet Take 1 tablet (10 mg total) by mouth daily. 30 tablet 1  . b complex vitamins tablet Take 1 tablet by mouth daily.    . busPIRone (BUSPAR) 5 MG tablet Take 5 mg by mouth 2 (two) times daily.     . Calcium Carbonate-Vit D-Min (CALCIUM 1200 PO) Take 1,200 mg by mouth daily.    . Cholecalciferol (VITAMIN D-3) 1000 UNITS CAPS Take 1,000 Units by mouth daily.    . Cyanocobalamin (VITAMIN B-12 PO) Take 1 tablet by mouth every morning.    Marland Kitchen dexamethasone (DECADRON) 1 MG tablet Take 1 tablet at bedtime before the blood test 1 tablet 0  . DULoxetine (CYMBALTA) 30 MG capsule Take 30 mg by mouth daily.  3  . DULoxetine (CYMBALTA) 60 MG capsule Take 60 mg by mouth daily.    Marland Kitchen levothyroxine (SYNTHROID, LEVOTHROID) 137 MCG tablet Take 137 mcg  by mouth daily before breakfast.    . Multiple Vitamin (MULTIVITAMIN WITH MINERALS) TABS Take 1 tablet by mouth every morning.    Marland Kitchen rOPINIRole (REQUIP) 0.25 MG tablet TAKE 1 TABLET (0.25 MG TOTAL) BY MOUTH AT BEDTIME. 90 tablet 1  . valsartan (DIOVAN) 160 MG tablet Take 1 tablet (160 mg total) by mouth daily. 90 tablet 3   No current facility-administered medications for this visit.     Past Medical History:  Diagnosis Date  . Anxiety   . Depression   . HTN (hypertension)    resolved with weight loss s/p gastric bypass  . Hypothyroidism    Since at least 2007  . Osteoporosis   . RLS (restless legs syndrome)   . Sleep apnea   . Stroke [facility])    2013/February 2017    Past Surgical History:  Procedure Laterality Date  . CATARACT EXTRACTION  2018  . CHOLECYSTECTOMY    . COLONOSCOPY  2004   internal hemorrhoids, left-sided diverticulae, splenic flexure polyp (25mm), path unavailable at this time  . COLONOSCOPY  04/2011   RMR: Internal and external hemorrhoids, rectal tubular adenoma removed, left-sided diverticulosis  . COLONOSCOPY N/A 06/01/2016   Procedure: COLONOSCOPY;  Surgeon: Daneil Dolin, MD;  Location: [facility];  Service: Endoscopy;  Laterality: N/A;  0830  . ESOPHAGOGASTRODUODENOSCOPY  2004   small hiatal hernia  . GASTRIC ROUX-EN-Y  2006    Social History  Social History  . Marital status: Divorced    Spouse name: N/A  . Number of children: 1  . Years of education: college   Occupational History  . retired    .  Retired   Social History Main Topics  . Smoking status: Former Smoker    Quit date: 11/28/1987  . Smokeless tobacco: Never Used     Comment: Quit x 25 plus years  . Alcohol use No  . Drug use: No  . Sexual activity: Not on file   Other Topics Concern  . Not on file   Social History Narrative   Caffeine 6 cups daily.  Retired/[employer],   2 yrs college,  Divorced, one daughter.     Vitals:   04/11/17 1122  BP: 126/74  Pulse: 78  SpO2:  97%  Weight: 208 lb (94.3 kg)  Height: 5\' 4"  (1.626 m)    Wt Readings from Last 3 Encounters:  04/11/17 208 lb (94.3 kg)  02/28/17 206 lb (93.4 kg)  01/16/17 209 lb (94.8 kg)     PHYSICAL EXAM General: NAD HEENT: Normal. Neck: No JVD, no thyromegaly. Lungs: Clear to auscultation bilaterally with normal respiratory effort. CV: Nondisplaced PMI.  Regular rate and rhythm, normal S1/S2, no S3/S4, no murmur. No pretibial or periankle edema.  No carotid bruit.   Abdomen: Soft, nontender, no distention.  Neurologic: Alert and oriented.  Psych: Normal affect. Skin: Normal. Musculoskeletal: No gross deformities.    ECG: Most recent ECG reviewed.   Labs: Lab Results  Component Value Date/Time   K 3.6 01/04/2016 04:53 PM   BUN 12 01/04/2016 04:53 PM   CREATININE 0.80 01/04/2016 04:53 PM   ALT 18 01/04/2016 04:32 PM   HGB 12.9 01/04/2016 04:53 PM     Lipids: Lab Results  Component Value Date/Time   LDLCALC 110 (H) 01/05/2016 04:37 AM   CHOL 184 01/05/2016 04:37 AM   TRIG 65 01/05/2016 04:37 AM   HDL 61 01/05/2016 04:37 AM       ASSESSMENT AND PLAN: 1. Labile hypertension: Much better controlled on Diovan 160 mg daily. I have asked her to continue to monitor blood pressures 3 times per week. I have recommended exercise in the form of walking and weight loss.  2. Shortness of breath and fatigue: Low risk nuclear stress test. She demonstrated a hypertensive response exercise. Symptoms have significantly improved with blood pressure control..  3. Obstructive sleep apnea: Continues on CPAP.  4. Hyperlipidemia: Remains on Lipitor. No changes.  5. History of CVAs: Continue aspirin 81 mg.  6. Palpitations: Symptoms have improved.   Disposition: Follow up 4 months.  Kate Sable, M.D., F.A.C.C.

## 2017-04-11 NOTE — Patient Instructions (Signed)
Medication Instructions:  Your physician recommends that you continue on your current medications as directed. Please refer to the Current Medication list given to you today.   Labwork: NONE  Testing/Procedures: NONE  Follow-Up: Your physician recommends that you schedule a follow-up appointment in: 4 MONTHS    Any Other Special Instructions Will Be Listed Below (If Applicable).  PLEASE KEEP A BLOOD PRESSURE LOG AND BRING IT TO NEXT VISIT   If you need a refill on your cardiac medications before your next appointment, please call your pharmacy.

## 2017-04-18 DIAGNOSIS — H25011 Cortical age-related cataract, right eye: Secondary | ICD-10-CM | POA: Diagnosis not present

## 2017-04-18 DIAGNOSIS — H2511 Age-related nuclear cataract, right eye: Secondary | ICD-10-CM | POA: Diagnosis not present

## 2017-04-24 DIAGNOSIS — H2511 Age-related nuclear cataract, right eye: Secondary | ICD-10-CM | POA: Diagnosis not present

## 2017-04-24 DIAGNOSIS — H25012 Cortical age-related cataract, left eye: Secondary | ICD-10-CM | POA: Diagnosis not present

## 2017-04-24 DIAGNOSIS — H52202 Unspecified astigmatism, left eye: Secondary | ICD-10-CM | POA: Diagnosis not present

## 2017-04-24 DIAGNOSIS — H2512 Age-related nuclear cataract, left eye: Secondary | ICD-10-CM | POA: Diagnosis not present

## 2017-06-04 ENCOUNTER — Ambulatory Visit (INDEPENDENT_AMBULATORY_CARE_PROVIDER_SITE_OTHER): Payer: Medicare Other | Admitting: Ophthalmology

## 2017-06-04 DIAGNOSIS — H43813 Vitreous degeneration, bilateral: Secondary | ICD-10-CM | POA: Diagnosis not present

## 2017-06-04 DIAGNOSIS — H35033 Hypertensive retinopathy, bilateral: Secondary | ICD-10-CM

## 2017-06-04 DIAGNOSIS — H353121 Nonexudative age-related macular degeneration, left eye, early dry stage: Secondary | ICD-10-CM | POA: Diagnosis not present

## 2017-06-04 DIAGNOSIS — I10 Essential (primary) hypertension: Secondary | ICD-10-CM

## 2017-06-04 DIAGNOSIS — H35372 Puckering of macula, left eye: Secondary | ICD-10-CM

## 2017-06-04 DIAGNOSIS — H353112 Nonexudative age-related macular degeneration, right eye, intermediate dry stage: Secondary | ICD-10-CM

## 2017-08-14 ENCOUNTER — Ambulatory Visit (INDEPENDENT_AMBULATORY_CARE_PROVIDER_SITE_OTHER): Payer: Medicare Other | Admitting: Cardiovascular Disease

## 2017-08-14 ENCOUNTER — Encounter: Payer: Self-pay | Admitting: Cardiovascular Disease

## 2017-08-14 VITALS — BP 140/80 | HR 89 | Ht 64.0 in | Wt 209.0 lb

## 2017-08-14 DIAGNOSIS — Z8673 Personal history of transient ischemic attack (TIA), and cerebral infarction without residual deficits: Secondary | ICD-10-CM

## 2017-08-14 DIAGNOSIS — R5383 Other fatigue: Secondary | ICD-10-CM

## 2017-08-14 DIAGNOSIS — I63311 Cerebral infarction due to thrombosis of right middle cerebral artery: Secondary | ICD-10-CM

## 2017-08-14 DIAGNOSIS — G4733 Obstructive sleep apnea (adult) (pediatric): Secondary | ICD-10-CM | POA: Diagnosis not present

## 2017-08-14 DIAGNOSIS — R0602 Shortness of breath: Secondary | ICD-10-CM | POA: Diagnosis not present

## 2017-08-14 DIAGNOSIS — R0989 Other specified symptoms and signs involving the circulatory and respiratory systems: Secondary | ICD-10-CM

## 2017-08-14 DIAGNOSIS — R002 Palpitations: Secondary | ICD-10-CM

## 2017-08-14 DIAGNOSIS — Z9989 Dependence on other enabling machines and devices: Secondary | ICD-10-CM | POA: Diagnosis not present

## 2017-08-14 MED ORDER — LOSARTAN POTASSIUM 100 MG PO TABS: 100.0000 mg | ORAL_TABLET | Freq: Every day | ORAL | 3 refills | Status: DC

## 2017-08-14 NOTE — Patient Instructions (Signed)
Medication Instructions:  STOP DIOVAN  START LOSARTAN 100 MG DAILY   Labwork: NONE  Testing/Procedures: NONE  Follow-Up: Your physician wants you to follow-up in: 6 MONTHS.  You will receive a reminder letter in the mail two months in advance. If you don't receive a letter, please call our office to schedule the follow-up appointment.   Any Other Special Instructions Will Be Listed Below (If Applicable).     If you need a refill on your cardiac medications before your next appointment, please call your pharmacy.

## 2017-08-14 NOTE — Progress Notes (Signed)
SUBJECTIVE: The patient presents for follow-up of labile hypertension, shortness of breath, fatigue, and palpitations.  The patient denies any symptoms of chest pain, palpitations, shortness of breath, lightheadedness, dizziness, leg swelling, orthopnea, PND, and syncope.  She forgot to bring her blood pressure log. She said her blood pressure fluctuates but systolic readings are never below 140 and often range from 140-175.  She had cataract surgery this past summer and was told that she has hypertensive retinopathy.  She is currently taking Diovan.   Review of Systems: As per "subjective", otherwise negative.  Allergies  Allergen Reactions  . Amoxicillin-Pot Clavulanate Hives and Rash    Has patient had a PCN reaction causing immediate rash, facial/tongue/throat swelling, SOB or lightheadedness with hypotension:no Has patient had a PCN reaction causing severe rash involving mucus membranes or skin necrosis:no      Current Outpatient Prescriptions  Medication Sig Dispense Refill  . ALPRAZolam (XANAX) 0.5 MG tablet Take 0.5 mg by mouth daily as needed. For anxiety and/or sleep    . aspirin EC 81 MG tablet Take 81 mg by mouth daily.    Marland Kitchen atorvastatin (LIPITOR) 10 MG tablet Take 1 tablet (10 mg total) by mouth daily. 30 tablet 1  . b complex vitamins tablet Take 1 tablet by mouth daily.    . busPIRone (BUSPAR) 5 MG tablet Take 5 mg by mouth 2 (two) times daily.     . Calcium Carbonate-Vit D-Min (CALCIUM 1200 PO) Take 1,200 mg by mouth daily.    . Cholecalciferol (VITAMIN D-3) 1000 UNITS CAPS Take 1,000 Units by mouth daily.    . Cyanocobalamin (VITAMIN B-12 PO) Take 1 tablet by mouth every morning.    . DULoxetine (CYMBALTA) 30 MG capsule Take 30 mg by mouth daily.  3  . DULoxetine (CYMBALTA) 60 MG capsule Take 60 mg by mouth daily.    Marland Kitchen levothyroxine (SYNTHROID, LEVOTHROID) 137 MCG tablet Take 137 mcg by mouth daily before breakfast.    . Multiple Vitamin (MULTIVITAMIN  WITH MINERALS) TABS Take 1 tablet by mouth every morning.    Marland Kitchen rOPINIRole (REQUIP) 0.25 MG tablet TAKE 1 TABLET (0.25 MG TOTAL) BY MOUTH AT BEDTIME. 90 tablet 1  . valsartan (DIOVAN) 160 MG tablet Take 1 tablet (160 mg total) by mouth daily. 90 tablet 3   No current facility-administered medications for this visit.     Past Medical History:  Diagnosis Date  . Anxiety   . Depression   . HTN (hypertension)    resolved with weight loss s/p gastric bypass  . Hypothyroidism    Since at least 2007  . Osteoporosis   . RLS (restless legs syndrome)   . Sleep apnea   . Stroke [facility])    2013/February 2017    Past Surgical History:  Procedure Laterality Date  . CATARACT EXTRACTION  2018  . CHOLECYSTECTOMY    . COLONOSCOPY  2004   internal hemorrhoids, left-sided diverticulae, splenic flexure polyp (18mm), path unavailable at this time  . COLONOSCOPY  04/2011   RMR: Internal and external hemorrhoids, rectal tubular adenoma removed, left-sided diverticulosis  . COLONOSCOPY N/A 06/01/2016   Procedure: COLONOSCOPY;  Surgeon: Daneil Dolin, MD;  Location: [facility];  Service: Endoscopy;  Laterality: N/A;  0830  . ESOPHAGOGASTRODUODENOSCOPY  2004   small hiatal hernia  . GASTRIC ROUX-EN-Y  2006    Social History   Social History  . Marital status: Divorced    Spouse name: N/A  . Number  of children: 1  . Years of education: college   Occupational History  . retired    .  Retired   Social History Main Topics  . Smoking status: Former Smoker    Quit date: 11/28/1987  . Smokeless tobacco: Never Used     Comment: Quit x 25 plus years  . Alcohol use No  . Drug use: No  . Sexual activity: Not on file   Other Topics Concern  . Not on file   Social History Narrative   Caffeine 6 cups daily.  Retired/[employer],   2 yrs college,  Divorced, one daughter.     Vitals:   08/14/17 1057  BP: 140/80  Pulse: 89  SpO2: 96%  Weight: 209 lb (94.8 kg)  Height: 5\' 4"  (1.626 m)    Wt  Readings from Last 3 Encounters:  08/14/17 209 lb (94.8 kg)  04/11/17 208 lb (94.3 kg)  02/28/17 206 lb (93.4 kg)     PHYSICAL EXAM General: NAD HEENT: Normal. Neck: No JVD, no thyromegaly. Lungs: Clear to auscultation bilaterally with normal respiratory effort. CV: Nondisplaced PMI.  Regular rate and rhythm, normal S1/S2, no S3/S4, no murmur. No pretibial or periankle edema.  No carotid bruit.   Abdomen: Soft, nontender, no distention.  Neurologic: Alert and oriented.  Psych: Normal affect. Skin: Normal. Musculoskeletal: No gross deformities.    ECG: Most recent ECG reviewed.   Labs: Lab Results  Component Value Date/Time   K 3.6 01/04/2016 04:53 PM   BUN 12 01/04/2016 04:53 PM   CREATININE 0.80 01/04/2016 04:53 PM   ALT 18 01/04/2016 04:32 PM   HGB 12.9 01/04/2016 04:53 PM     Lipids: Lab Results  Component Value Date/Time   LDLCALC 110 (H) 01/05/2016 04:37 AM   CHOL 184 01/05/2016 04:37 AM   TRIG 65 01/05/2016 04:37 AM   HDL 61 01/05/2016 04:37 AM       ASSESSMENT AND PLAN:  1. Labile hypertension: Mildly elevated today on Diovan 160 mg daily.  Given the recall, I will discontinue Diovan and start losartan 100 mg daily.  2. Shortness of breath and fatigue: Low risk nuclear stress test. She demonstrated a hypertensive response to exercise. Symptoms have significantly improved with blood pressure control..  3. Obstructive sleep apnea: Continues on CPAP.  4. Hyperlipidemia: Remains on Lipitor. No changes.  5. History of CVAs: Continue aspirin 81 mg.  6. Palpitations: Symptoms have improved.     Disposition: Follow up 6 months.   Kate Sable, M.D., F.A.C.C.

## 2017-09-02 ENCOUNTER — Other Ambulatory Visit: Payer: Self-pay | Admitting: Neurology

## 2017-09-02 DIAGNOSIS — G2581 Restless legs syndrome: Secondary | ICD-10-CM

## 2017-09-02 DIAGNOSIS — Z9989 Dependence on other enabling machines and devices: Secondary | ICD-10-CM

## 2017-09-02 DIAGNOSIS — G4733 Obstructive sleep apnea (adult) (pediatric): Secondary | ICD-10-CM

## 2017-09-02 DIAGNOSIS — I63311 Cerebral infarction due to thrombosis of right middle cerebral artery: Secondary | ICD-10-CM

## 2017-09-04 DIAGNOSIS — Z23 Encounter for immunization: Secondary | ICD-10-CM | POA: Diagnosis not present

## 2017-09-04 DIAGNOSIS — F419 Anxiety disorder, unspecified: Secondary | ICD-10-CM | POA: Diagnosis not present

## 2017-09-04 DIAGNOSIS — E063 Autoimmune thyroiditis: Secondary | ICD-10-CM | POA: Diagnosis not present

## 2017-09-04 DIAGNOSIS — G2581 Restless legs syndrome: Secondary | ICD-10-CM | POA: Diagnosis not present

## 2017-09-04 DIAGNOSIS — F329 Major depressive disorder, single episode, unspecified: Secondary | ICD-10-CM | POA: Diagnosis not present

## 2017-09-04 DIAGNOSIS — Z6836 Body mass index (BMI) 36.0-36.9, adult: Secondary | ICD-10-CM | POA: Diagnosis not present

## 2017-09-04 DIAGNOSIS — E669 Obesity, unspecified: Secondary | ICD-10-CM | POA: Diagnosis not present

## 2017-09-26 ENCOUNTER — Other Ambulatory Visit (HOSPITAL_COMMUNITY): Payer: Self-pay | Admitting: Internal Medicine

## 2017-09-26 DIAGNOSIS — Z1231 Encounter for screening mammogram for malignant neoplasm of breast: Secondary | ICD-10-CM

## 2017-09-28 ENCOUNTER — Encounter (HOSPITAL_COMMUNITY): Payer: Self-pay

## 2017-09-28 ENCOUNTER — Ambulatory Visit (HOSPITAL_COMMUNITY)
Admission: RE | Admit: 2017-09-28 | Discharge: 2017-09-28 | Disposition: A | Discharge status: Home / Self Care | Payer: Medicare Other | Source: Ambulatory Visit | Attending: Internal Medicine | Admitting: Internal Medicine

## 2017-09-28 DIAGNOSIS — Z1231 Encounter for screening mammogram for malignant neoplasm of breast: Secondary | ICD-10-CM | POA: Insufficient documentation

## 2017-11-01 DIAGNOSIS — E785 Hyperlipidemia, unspecified: Secondary | ICD-10-CM | POA: Diagnosis not present

## 2017-11-01 DIAGNOSIS — E782 Mixed hyperlipidemia: Secondary | ICD-10-CM | POA: Diagnosis not present

## 2017-11-01 DIAGNOSIS — E559 Vitamin D deficiency, unspecified: Secondary | ICD-10-CM | POA: Diagnosis not present

## 2017-11-01 DIAGNOSIS — Z1389 Encounter for screening for other disorder: Secondary | ICD-10-CM | POA: Diagnosis not present

## 2017-11-01 DIAGNOSIS — Z6836 Body mass index (BMI) 36.0-36.9, adult: Secondary | ICD-10-CM | POA: Diagnosis not present

## 2017-11-01 DIAGNOSIS — Z23 Encounter for immunization: Secondary | ICD-10-CM | POA: Diagnosis not present

## 2017-11-01 DIAGNOSIS — I10 Essential (primary) hypertension: Secondary | ICD-10-CM | POA: Diagnosis not present

## 2017-11-01 DIAGNOSIS — E669 Obesity, unspecified: Secondary | ICD-10-CM | POA: Diagnosis not present

## 2017-11-01 DIAGNOSIS — E748 Other specified disorders of carbohydrate metabolism: Secondary | ICD-10-CM | POA: Diagnosis not present

## 2017-11-01 DIAGNOSIS — M1991 Primary osteoarthritis, unspecified site: Secondary | ICD-10-CM | POA: Diagnosis not present

## 2017-11-01 DIAGNOSIS — I639 Cerebral infarction, unspecified: Secondary | ICD-10-CM | POA: Diagnosis not present

## 2017-11-01 DIAGNOSIS — R946 Abnormal results of thyroid function studies: Secondary | ICD-10-CM | POA: Diagnosis not present

## 2017-11-01 DIAGNOSIS — E538 Deficiency of other specified B group vitamins: Secondary | ICD-10-CM | POA: Diagnosis not present

## 2017-11-01 DIAGNOSIS — D510 Vitamin B12 deficiency anemia due to intrinsic factor deficiency: Secondary | ICD-10-CM | POA: Diagnosis not present

## 2017-12-06 ENCOUNTER — Encounter (INDEPENDENT_AMBULATORY_CARE_PROVIDER_SITE_OTHER): Payer: Medicare Other | Admitting: Ophthalmology

## 2017-12-06 DIAGNOSIS — I10 Essential (primary) hypertension: Secondary | ICD-10-CM

## 2017-12-06 DIAGNOSIS — H43813 Vitreous degeneration, bilateral: Secondary | ICD-10-CM | POA: Diagnosis not present

## 2017-12-06 DIAGNOSIS — H35033 Hypertensive retinopathy, bilateral: Secondary | ICD-10-CM | POA: Diagnosis not present

## 2017-12-06 DIAGNOSIS — H353112 Nonexudative age-related macular degeneration, right eye, intermediate dry stage: Secondary | ICD-10-CM | POA: Diagnosis not present

## 2017-12-06 DIAGNOSIS — H35372 Puckering of macula, left eye: Secondary | ICD-10-CM | POA: Diagnosis not present

## 2017-12-06 DIAGNOSIS — H35342 Macular cyst, hole, or pseudohole, left eye: Secondary | ICD-10-CM | POA: Diagnosis not present

## 2018-01-02 ENCOUNTER — Telehealth: Payer: Self-pay | Admitting: Neurology

## 2018-01-02 NOTE — Telephone Encounter (Signed)
Called the pt and made her aware that Dr Brett Fairy will be out of the office on 2/14 for her apt and I was able to reschedule her 2/26 at 11:30 am and patient verbalized understanding

## 2018-01-10 ENCOUNTER — Ambulatory Visit: Payer: Medicare Other | Admitting: Neurology

## 2018-01-22 ENCOUNTER — Ambulatory Visit (INDEPENDENT_AMBULATORY_CARE_PROVIDER_SITE_OTHER): Payer: Medicare Other | Admitting: Neurology

## 2018-01-22 ENCOUNTER — Encounter: Payer: Self-pay | Admitting: Neurology

## 2018-01-22 DIAGNOSIS — I63311 Cerebral infarction due to thrombosis of right middle cerebral artery: Secondary | ICD-10-CM

## 2018-01-22 DIAGNOSIS — G4733 Obstructive sleep apnea (adult) (pediatric): Secondary | ICD-10-CM

## 2018-01-22 DIAGNOSIS — Z9989 Dependence on other enabling machines and devices: Secondary | ICD-10-CM

## 2018-01-22 DIAGNOSIS — G2581 Restless legs syndrome: Secondary | ICD-10-CM | POA: Insufficient documentation

## 2018-01-22 MED ORDER — ROPINIROLE HCL 0.25 MG PO TABS: 0.2500 mg | ORAL_TABLET | Freq: Every day | ORAL | 3 refills | Status: DC

## 2018-01-22 MED ORDER — ATORVASTATIN CALCIUM 10 MG PO TABS: 10.0000 mg | ORAL_TABLET | Freq: Every day | ORAL | 1 refills | Status: AC

## 2018-01-22 NOTE — Addendum Note (Signed)
Addended by: Larey Seat on: 01/22/2018 11:43 AM   Modules accepted: Orders

## 2018-01-22 NOTE — Patient Instructions (Signed)

## 2018-01-22 NOTE — Progress Notes (Signed)
PATIENT: Virginia [name] DOB: 1946/11/16  REASON FOR VISIT: follow up, primary neurologist is Dr Jannifer Franklin.  HISTORY FROM: patient  HISTORY OF PRESENT ILLNESS: I follow this 72 year old caucasian female patient for OSA compliance on CPAP. She stopped using CPAP - sleeps now in a recliner.  She lost her brother to a series of stroke beginning  in 11-11-17, he died of strokes in 12-Dec-2017.  His caretaker had neither filled his BP medication and she emptied his bank account to which she had no permission. Mrs. Tewell had to pay the funeral expenses.  Her daughter was ill with beast cancer, was under Chemo and radiation. She has been diagnosed with a retinal hole and had cataract surgery. She could not see well enough to make it for tax season 2019 . The year was hard on her. Her second brother was just diagnosed with bone cancer. She needs refills. She sleeps now in a recliner and stopped using CPAP- RLS  Still present , using Requip. She needs refills. Reports insomnia- all psychological. Recommend to go to grief counseling.   No further sleep clinic visits are needed. She may follow up with Dr Jannifer Franklin and NP.     NP note -01/10/2017: [name] is a 72 year old female patient of Dr. Jannifer Franklin  with a history of obstructive sleep apnea on CPAP. She returns today for a compliance download. Her download indicates that she uses her machine 29 out of 30 days for compliance of 97%. She uses her machine greater than 4 hours 26 out of 30 days for compliance of 87%. Her residual AHI is 3.3 on a minimum pressure of 5cm water with maximum pressure of 10 cm of water. On average she uses her machine 6 hours and 34 minutes. The patient reports that she has been under a lot of stress in the last month. she reports that her brother passed away from a stroke and her daughter has been diagnosed with breast cancer. She states in the last 3 weeks she has not used her CPAP diligently. She has noticed more daytime  sleepiness. She reports that she had a fall in 12/12/2022. Reports that she tripped and hit her head on a brick wall. She did have a CT scan that was relatively unremarkable. Reports that she had bruising on the left side of the face that has now resolved. She returns today for an evaluation.   HISTORY per Dr. Edwena Felty notes: Virginia [name] is a 72 y.o. female ,seen here as a revisit from [place], who sees the patient for Stroke since August 2nd 2013 .   She has a CPAP for OSA treatment and is seen here for compliance.  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 She is now on Medicare , and has needed to get new CPAP supplies.  In 2013 she developed left-sided hemiparesis involving face and arm predominantly. The left side was affected. She has returned to the current , normal level of function . The patient was tested for sleep apnea in 08-2012  followed by a CPAP titration. PolySomnogram was ordered by Dr Jannifer Franklin to check for secondary risk for stroke . The patient had previously been diagnosed with sleep apnea in 2006 , but at that time the RDI was 19 / hour and she had PLM related arousals  more frequently than apnea related events.  At that time,  she was also considered morbidly obese and a candidate for gastric  bypass  surgery.  After she underwent weight loss surgery a weight loss of 100 pounds resulted . This also seemed to have resolved obstructive sleep apnea at least for a period of time , but over the years she had regained about 50 pounds and she noted a return of the symptoms , which brought her back to the sleep lab.  On 08-11-12 She underwent a diagnostic polysomnogram,  which documented an AHI of 17.9 and on 09-15-12 she was titrated successfully to CPAP and optimal titration device was ordered for the patient it seemed that she responded best to very low pressure such as 6 cm water.  A compliance download was obtained in 2015 for a whole year.  The compliance  gap in  12/28/2012 resulted from a fall with a fracture of the right wrist, the patient was unable to use the headgear and actually slept in a recliner. The patient lives with her psychiatrically ill daughter , who has ADHD, OCD claustrophobia and major depression. The second time period in October  2015 resulted from a psychiatric break down after her brother's wedding party. Her daughter's daughter is 30 and also living with them.  The patient was under a lot of stress and finally her daughter was committed.  She is her daughter's (18 y.o.) main caretaker.   2015 CPAP compliance : The patient fulfills the CMS requirement , AHI is 3.0, and auto -titrator is not restricted- the pressure window is between 4 and 20 CM water?  72% compliance over 12 month . 95% percentile pressure is 9.6 cm water.  In the last 90 days, her compliance was over 85% . 97% CPAP compliance in 2013.   [name] brought me her CPAP machine , dated 12-01-14 and encompassed the last 30 days.  The patient has a high compliance 93% of days of use and every single day all for 4 hours. The average time of use nightly is 8 hours 8 minutes the patient uses an auto Pap the minimum pressure is 5 and the maximum pressure 10 cm water at the 95% percentile she is at 9.7 cm water her AHI is 4.1 reviewing the grafts I noticed that the AHI is not that highly variable day by day and that the airleak doesn't play a role. In the 90 day download her AHI was 3.6 her compliance 93% again -every night over 4 hours 8 hours and 12 minutes on average, between 5 and 10 cm water out of  95% percentile here as well 9.7 cm water. She would like a little more air to start and a shorter RAMP , starting at 5 cm , The patient denies any discomfort with the mask fit, she does not swallow air or is air hungry, and she does not complain of any condensation water accumulating. Her Epworth sleepiness score is 6 points and her fatigue score 28 points. When I met her last  year she was very depressed and I think she is now having a more happy outlook again.  History from 12/16/2015 [name] is here today for her regular yearly CPAP compliance visit. She is using an old machine, an[employer]scape by [employer]	[place],. She has been on this machine for 3-1/2 years. The patient shows a 90% compliance on today's download for over 4 hours of daily use average user time 6 hours 52 minutes, AHI is 3.2 which is in a good resolution. She uses a minimum pressure of 5 and a maximum pressure of 10 cm water the 91st percentile  is 9.6.  The patient has endorsed the geriatric depression score at one point, the fatigue severity score at 17 and the Epworth sleepiness score at 7 points. She lost 8 pounds over the last 12 month. She reported having higher glucose levels. HbcA1c score reportedly rose to 5.9. She needs no medication, diet controlled. She believes that the Zocor may have negatively affected her blood sugar levels as well as her memory. I'm not aware of the diabetic side effects but I am well aware of possible memory side effects as well as myopathy.   REVIEW OF SYSTEMS: Out of a complete 14 system review of symptoms, the patient complains only of the following symptoms, and all other reviewed systems are negative.  Insomnia, FSS 52 points, Epworth 12/24 , depression 9 out of 15 !  Tearful , RLS-  Aching muscles, agitation, depression, nervous/anxious, cold intolerance  ALLERGIES: Allergies  Allergen Reactions  . Amoxicillin-Pot Clavulanate Hives and Rash    Has patient had a PCN reaction causing immediate rash, facial/tongue/throat swelling, SOB or lightheadedness with hypotension:no Has patient had a PCN reaction causing severe rash involving mucus membranes or skin necrosis:no      HOME MEDICATIONS: Outpatient Medications Prior to Visit  Medication Sig Dispense Refill  . ALPRAZolam (XANAX) 0.5 MG tablet Take 0.5 mg by mouth daily as needed. For anxiety and/or sleep    .  aspirin EC 81 MG tablet Take 81 mg by mouth daily.    Marland Kitchen atorvastatin (LIPITOR) 10 MG tablet Take 1 tablet (10 mg total) by mouth daily. 30 tablet 1  . b complex vitamins tablet Take 1 tablet by mouth daily.    . busPIRone (BUSPAR) 5 MG tablet Take 5 mg by mouth 2 (two) times daily.     . Calcium Carbonate-Vit D-Min (CALCIUM 1200 PO) Take 1,200 mg by mouth daily.    . Cholecalciferol (VITAMIN D-3) 1000 UNITS CAPS Take 1,000 Units by mouth daily.    . Cyanocobalamin (VITAMIN B-12 PO) Take 1 tablet by mouth every morning.    . DULoxetine (CYMBALTA) 60 MG capsule Take 60 mg by mouth daily.    Marland Kitchen levothyroxine (SYNTHROID, LEVOTHROID) 137 MCG tablet Take 137 mcg by mouth daily before breakfast.    . Multiple Vitamin (MULTIVITAMIN WITH MINERALS) TABS Take 1 tablet by mouth every morning.    . Multiple Vitamins-Minerals (ICAPS AREDS 2 PO) Take 2 capsules by mouth daily.    Marland Kitchen rOPINIRole (REQUIP) 0.25 MG tablet TAKE 1 TABLET BY MOUTH AT BEDTIME 90 tablet 1  . losartan (COZAAR) 100 MG tablet Take 1 tablet (100 mg total) by mouth daily. 90 tablet 3  . DULoxetine (CYMBALTA) 30 MG capsule Take 30 mg by mouth daily.  3   No facility-administered medications prior to visit.     PAST MEDICAL HISTORY: Past Medical History:  Diagnosis Date  . Anxiety   . Depression   . HTN (hypertension)    resolved with weight loss s/p gastric bypass  . Hypothyroidism    Since at least 2007  . Osteoporosis   . RLS (restless legs syndrome)   . Sleep apnea   . Stroke [facility])    2013/February 2017    PAST SURGICAL HISTORY: Past Surgical History:  Procedure Laterality Date  . BREAST BIOPSY Right    benign  . CATARACT EXTRACTION  2018  . CHOLECYSTECTOMY    . COLONOSCOPY  2004   internal hemorrhoids, left-sided diverticulae, splenic flexure polyp (77m), path unavailable at this time  .  COLONOSCOPY  04/2011   RMR: Internal and external hemorrhoids, rectal tubular adenoma removed, left-sided diverticulosis  .  COLONOSCOPY N/A 06/01/2016   Procedure: COLONOSCOPY;  Surgeon: Daneil Dolin, MD;  Location: [facility];  Service: Endoscopy;  Laterality: N/A;  0830  . ESOPHAGOGASTRODUODENOSCOPY  2004   small hiatal hernia  . GASTRIC ROUX-EN-Y  2006    FAMILY HISTORY: Family History  Problem Relation Age of Onset  . Lung cancer Father   . Stroke Father   . Stroke Mother   . Stroke Brother   . Hypertension Brother   . Stroke Maternal Grandmother   . Stroke Maternal Grandfather   . Cancer Unknown        family history   . Coronary artery disease Unknown        family history   . Arthritis Unknown        family history   . Cancer Daughter     SOCIAL HISTORY: Social History   Socioeconomic History  . Marital status: Divorced    Spouse name: Not on file  . Number of children: 1  . Years of education: college  . Highest education level: Not on file  Social Needs  . Financial resource strain: Not on file  . Food insecurity - worry: Not on file  . Food insecurity - inability: Not on file  . Transportation needs - medical: Not on file  . Transportation needs - non-medical: Not on file  Occupational History  . Occupation: retired     Fish farm manager: RETIRED  Tobacco Use  . Smoking status: Former Smoker    Last attempt to quit: 11/28/1987    Years since quitting: 30.1  . Smokeless tobacco: Never Used  . Tobacco comment: Quit x 25 plus years  Substance and Sexual Activity  . Alcohol use: No    Alcohol/week: 0.0 oz  . Drug use: No  . Sexual activity: Not on file  Other Topics Concern  . Not on file  Social History Narrative   Caffeine 6 cups daily.  Retired/[employer],   2 yrs college,  Divorced, one daughter.      PHYSICAL EXAM  Vitals:   01/22/18 1103  BP: 139/80  Pulse: 72  Weight: 205 lb (93 kg)  Height: '5\' 4"'  (1.626 m)   Body mass index is 35.19 kg/m.  Generalized: Well developed, I\tearful, distressed   Mallampati 3, neck circumference 15.5 '   Neurological  examination  Mentation: Alert oriented to time, place, history taking. Cranial nerve:  taste and smell intact- Pupils were equal round reactive to light. Extraocular movements were full, visual field were full on confrontational test. Facial sensation and strength were normal.  Uvula and tongue midline. Head turning and shoulder shrug  were normal and symmetric. Motor:  5 over 5 strength of all 4 extremities, symmetric motor tone  noted throughout.  Reflexes: Deep tendon reflexes are symmetric 2 plus .  DIAGNOSTIC DATA (LABS, IMAGING, TESTING) - I reviewed patient records, labs, notes, testing and imaging myself where available.  Lab Results  Component Value Date   WBC 8.4 01/04/2016   HGB 12.9 01/04/2016   HCT 38.0 01/04/2016   MCV 77.6 (L) 01/04/2016   PLT 365 01/04/2016      Component Value Date/Time   NA 139 01/04/2016 1653   K 3.6 01/04/2016 1653   CL 100 (L) 01/04/2016 1653   CO2 26 01/04/2016 1632   GLUCOSE 95 01/04/2016 1653   BUN 12 01/04/2016 1653  CREATININE 0.80 01/04/2016 1653   CALCIUM 9.6 01/04/2016 1632   PROT 7.9 01/04/2016 1632   ALBUMIN 4.0 01/04/2016 1632   AST 24 01/04/2016 1632   ALT 18 01/04/2016 1632   ALKPHOS 174 (H) 01/04/2016 1632   BILITOT 0.7 01/04/2016 1632   GFRNONAA >60 01/04/2016 1632   GFRAA >60 01/04/2016 1632   Lab Results  Component Value Date   CHOL 184 01/05/2016   HDL 61 01/05/2016   LDLCALC 110 (H) 01/05/2016   TRIG 65 01/05/2016   CHOLHDL 3.0 01/05/2016   Lab Results  Component Value Date   HGBA1C 5.7 (H) 01/05/2016     ASSESSMENT AND PLAN:  1. Obstructive sleep apnea , not longer on CPAP- she sleep in a recliner.  2. Grief -  Insomnia, depression, tearful.  3. RLS - will refill.   Mrs. Hislop will see  Dr Jannifer Franklin, or  NP in 12 month - should she want to return to CPAP we will need to reorder a sleep study and start from scratch.     Larey Seat, MD      01/22/2018, 11:15 AM [facility] [address] [place], [place] [address] [phone]

## 2018-03-28 DIAGNOSIS — Z1389 Encounter for screening for other disorder: Secondary | ICD-10-CM | POA: Diagnosis not present

## 2018-03-28 DIAGNOSIS — Z6836 Body mass index (BMI) 36.0-36.9, adult: Secondary | ICD-10-CM | POA: Diagnosis not present

## 2018-03-28 DIAGNOSIS — E559 Vitamin D deficiency, unspecified: Secondary | ICD-10-CM | POA: Diagnosis not present

## 2018-03-28 DIAGNOSIS — D509 Iron deficiency anemia, unspecified: Secondary | ICD-10-CM | POA: Diagnosis not present

## 2018-03-28 DIAGNOSIS — R946 Abnormal results of thyroid function studies: Secondary | ICD-10-CM | POA: Diagnosis not present

## 2018-03-28 DIAGNOSIS — I10 Essential (primary) hypertension: Secondary | ICD-10-CM | POA: Diagnosis not present

## 2018-03-28 DIAGNOSIS — E781 Pure hyperglyceridemia: Secondary | ICD-10-CM | POA: Diagnosis not present

## 2018-03-28 DIAGNOSIS — Z0001 Encounter for general adult medical examination with abnormal findings: Secondary | ICD-10-CM | POA: Diagnosis not present

## 2018-03-28 DIAGNOSIS — R7309 Other abnormal glucose: Secondary | ICD-10-CM | POA: Diagnosis not present

## 2018-03-28 DIAGNOSIS — F419 Anxiety disorder, unspecified: Secondary | ICD-10-CM | POA: Diagnosis not present

## 2018-03-28 DIAGNOSIS — F329 Major depressive disorder, single episode, unspecified: Secondary | ICD-10-CM | POA: Diagnosis not present

## 2018-03-28 DIAGNOSIS — E6609 Other obesity due to excess calories: Secondary | ICD-10-CM | POA: Diagnosis not present

## 2018-04-10 DIAGNOSIS — R599 Enlarged lymph nodes, unspecified: Secondary | ICD-10-CM | POA: Diagnosis not present

## 2018-04-10 DIAGNOSIS — G4730 Sleep apnea, unspecified: Secondary | ICD-10-CM | POA: Diagnosis not present

## 2018-04-10 DIAGNOSIS — Z6837 Body mass index (BMI) 37.0-37.9, adult: Secondary | ICD-10-CM | POA: Diagnosis not present

## 2018-04-12 DIAGNOSIS — G4730 Sleep apnea, unspecified: Secondary | ICD-10-CM | POA: Diagnosis not present

## 2018-04-15 DIAGNOSIS — Z139 Encounter for screening, unspecified: Secondary | ICD-10-CM | POA: Diagnosis not present

## 2018-04-29 DIAGNOSIS — E063 Autoimmune thyroiditis: Secondary | ICD-10-CM | POA: Diagnosis not present

## 2018-05-06 DIAGNOSIS — G4733 Obstructive sleep apnea (adult) (pediatric): Secondary | ICD-10-CM | POA: Diagnosis not present

## 2018-06-10 DIAGNOSIS — Z1389 Encounter for screening for other disorder: Secondary | ICD-10-CM | POA: Diagnosis not present

## 2018-06-10 DIAGNOSIS — Z6836 Body mass index (BMI) 36.0-36.9, adult: Secondary | ICD-10-CM | POA: Diagnosis not present

## 2018-06-10 DIAGNOSIS — R5383 Other fatigue: Secondary | ICD-10-CM | POA: Diagnosis not present

## 2018-06-10 DIAGNOSIS — E6609 Other obesity due to excess calories: Secondary | ICD-10-CM | POA: Diagnosis not present

## 2018-06-10 DIAGNOSIS — E063 Autoimmune thyroiditis: Secondary | ICD-10-CM | POA: Diagnosis not present

## 2018-07-23 ENCOUNTER — Encounter: Payer: Self-pay | Admitting: Orthopedic Surgery

## 2018-07-23 ENCOUNTER — Encounter

## 2018-07-23 ENCOUNTER — Ambulatory Visit (INDEPENDENT_AMBULATORY_CARE_PROVIDER_SITE_OTHER): Payer: Medicare Other

## 2018-07-23 ENCOUNTER — Ambulatory Visit (INDEPENDENT_AMBULATORY_CARE_PROVIDER_SITE_OTHER): Payer: Medicare Other | Admitting: Orthopedic Surgery

## 2018-07-23 VITALS — BP 152/79 | HR 83 | Ht 64.0 in | Wt 206.0 lb

## 2018-07-23 DIAGNOSIS — I63311 Cerebral infarction due to thrombosis of right middle cerebral artery: Secondary | ICD-10-CM | POA: Diagnosis not present

## 2018-07-23 DIAGNOSIS — G8929 Other chronic pain: Secondary | ICD-10-CM

## 2018-07-23 DIAGNOSIS — M25562 Pain in left knee: Secondary | ICD-10-CM | POA: Diagnosis not present

## 2018-07-23 DIAGNOSIS — M25561 Pain in right knee: Secondary | ICD-10-CM

## 2018-07-23 NOTE — Patient Instructions (Addendum)

## 2018-07-23 NOTE — Progress Notes (Signed)
Progress Note problem  Patient ID: Virginia [name], female   DOB: 03/04/1946, 72 y.o.   MRN: [id number]   Chief Complaint  Patient presents with  . Knee Pain    bilateral right greater than left    HPI The patient presents for evaluation of bilateral knee pain right greater than left  Location bilateral knee pain right greater than left, right knee pain over the medial joint line and Pez tendons Duration 5 to 6 weeks Quality dull ache Severity moderate Associated with swelling decreased range of motion difficulty getting in and out of the car difficulty flexing the knee couple or giving way episodes  Left knee pain mild  Review of Systems  Constitutional: Negative for fever.  Musculoskeletal: Negative for back pain.  Skin: Negative.    Current Meds  Medication Sig  . ALPRAZolam (XANAX) 0.5 MG tablet Take 0.5 mg by mouth daily as needed. For anxiety and/or sleep  . aspirin EC 81 MG tablet Take 81 mg by mouth daily.  Marland Kitchen atorvastatin (LIPITOR) 10 MG tablet Take 1 tablet (10 mg total) by mouth daily.  Marland Kitchen b complex vitamins tablet Take 1 tablet by mouth daily.  . busPIRone (BUSPAR) 5 MG tablet Take 5 mg by mouth 2 (two) times daily.   . Calcium Carbonate-Vit D-Min (CALCIUM 1200 PO) Take 1,200 mg by mouth daily.  . Cholecalciferol (VITAMIN D-3) 1000 UNITS CAPS Take 1,000 Units by mouth daily.  . Cyanocobalamin (VITAMIN B-12 PO) Take 1 tablet by mouth every morning.  . DULoxetine (CYMBALTA) 60 MG capsule Take 60 mg by mouth daily.  Marland Kitchen levothyroxine (SYNTHROID, LEVOTHROID) 137 MCG tablet Take 137 mcg by mouth daily before breakfast.  . Multiple Vitamin (MULTIVITAMIN WITH MINERALS) TABS Take 1 tablet by mouth every morning.  . Multiple Vitamins-Minerals (ICAPS AREDS 2 PO) Take 2 capsules by mouth daily.  Marland Kitchen rOPINIRole (REQUIP) 0.25 MG tablet Take 1 tablet (0.25 mg total) by mouth at bedtime.    Past Medical History:  Diagnosis Date  . Anxiety   . Depression   . HTN (hypertension)     resolved with weight loss s/p gastric bypass  . Hypothyroidism    Since at least 2007  . Osteoporosis   . RLS (restless legs syndrome)   . Sleep apnea   . Stroke [facility])    2013/February 2017     Allergies  Allergen Reactions  . Amoxicillin-Pot Clavulanate Hives and Rash    Has patient had a PCN reaction causing immediate rash, facial/tongue/throat swelling, SOB or lightheadedness with hypotension:no Has patient had a PCN reaction causing severe rash involving mucus membranes or skin necrosis:no       BP (!) 152/79   Pulse 83   Ht 5\' 4"  (1.626 m)   Wt 206 lb (93.4 kg)   BMI 35.36 kg/m    Physical Exam  Constitutional: She is oriented to person, place, and time. She appears well-developed and well-nourished.  Musculoskeletal:       Right knee: She exhibits effusion.       Left knee: She exhibits no effusion.  Neurological: She is alert and oriented to person, place, and time.  Psychiatric: She has a normal mood and affect. Judgment normal.  Vitals reviewed.   Right Knee Exam   Muscle Strength  The patient has normal right knee strength.  Tenderness  The patient is experiencing tenderness in the pes anserinus and medial joint line.  Range of Motion  Extension: normal  Flexion:  120 normal  Tests  McMurray:  Medial - negative Lateral - negative Varus: negative Valgus: negative Drawer:  Anterior - negative    Posterior - negative  Other  Erythema: absent Scars: absent Sensation: normal Pulse: present Swelling: none Effusion: effusion present   Left Knee Exam   Muscle Strength  The patient has normal left knee strength.  Tenderness  The patient is experiencing tenderness in the medial joint line.  Range of Motion  Extension: normal  Flexion:  130 normal   Tests  McMurray:  Medial - negative Lateral - negative Varus: negative Valgus: negative Drawer:  Anterior - negative     Posterior - negative  Other  Erythema: absent Scars:  absent Sensation: normal Pulse: present Swelling: none Effusion: no effusion present      Arther Abbott, MD   MEDICAL DECISION MAKING   Imaging:  See reports  X-ray right and left knee show moderate arthritis of both knees right knee has an effusion patellofemoral joints involved in both knees  NEW PROBLEM  Encounter Diagnosis  Name Primary?  . Chronic pain of both knees Yes     PLAN: (RX., injection, surgery,frx,mri/ct, XR 2 body ares) Inject right knee for bursitis and for arthritis  Procedure note right knee injection verbal consent was obtained to inject right knee joint  Timeout was completed to confirm the site of injection  The medications used were 40 mg of Depo-Medrol and 1% lidocaine 3 cc  Anesthesia was provided by ethyl chloride and the skin was prepped with alcohol.  After cleaning the skin with alcohol a 20-gauge needle was used to inject the right knee joint. There were no complications. A sterile bandage was applied.   Procedure note right knee injection for bursitis  verbal consent was obtained to inject right knee PES BURSA  Timeout was completed to confirm the site of injection  The medications used were 40 mg of Depo-Medrol and 1% lidocaine 3 cc  Anesthesia was provided by ethyl chloride and the skin was prepped with alcohol.  After cleaning the skin with alcohol a 25-gauge needle was used to inject the right knee bursa.  There were no complications and a sterile bandage was applied   No orders of the defined types were placed in this encounter.  9:55 AM 07/23/2018

## 2018-07-31 ENCOUNTER — Other Ambulatory Visit: Payer: Self-pay | Admitting: Cardiovascular Disease

## 2018-09-03 ENCOUNTER — Emergency Department (HOSPITAL_COMMUNITY): Payer: Medicare Other

## 2018-09-03 ENCOUNTER — Encounter (HOSPITAL_COMMUNITY): Payer: Self-pay

## 2018-09-03 ENCOUNTER — Other Ambulatory Visit: Payer: Self-pay

## 2018-09-03 ENCOUNTER — Emergency Department (HOSPITAL_COMMUNITY)
Admission: EM | Admit: 2018-09-03 | Discharge: 2018-09-03 | Disposition: A | Discharge status: Home / Self Care | Payer: Medicare Other | Attending: Emergency Medicine | Admitting: Emergency Medicine

## 2018-09-03 DIAGNOSIS — R000 Tachycardia, unspecified: Secondary | ICD-10-CM | POA: Diagnosis not present

## 2018-09-03 DIAGNOSIS — Z7982 Long term (current) use of aspirin: Secondary | ICD-10-CM | POA: Diagnosis not present

## 2018-09-03 DIAGNOSIS — R51 Headache: Secondary | ICD-10-CM | POA: Diagnosis not present

## 2018-09-03 DIAGNOSIS — Z8673 Personal history of transient ischemic attack (TIA), and cerebral infarction without residual deficits: Secondary | ICD-10-CM | POA: Insufficient documentation

## 2018-09-03 DIAGNOSIS — F419 Anxiety disorder, unspecified: Secondary | ICD-10-CM | POA: Diagnosis not present

## 2018-09-03 DIAGNOSIS — R509 Fever, unspecified: Secondary | ICD-10-CM | POA: Diagnosis not present

## 2018-09-03 DIAGNOSIS — N3000 Acute cystitis without hematuria: Secondary | ICD-10-CM | POA: Diagnosis not present

## 2018-09-03 DIAGNOSIS — Z79899 Other long term (current) drug therapy: Secondary | ICD-10-CM | POA: Diagnosis not present

## 2018-09-03 DIAGNOSIS — G4489 Other headache syndrome: Secondary | ICD-10-CM | POA: Diagnosis not present

## 2018-09-03 DIAGNOSIS — I10 Essential (primary) hypertension: Secondary | ICD-10-CM | POA: Diagnosis not present

## 2018-09-03 DIAGNOSIS — R531 Weakness: Secondary | ICD-10-CM | POA: Diagnosis not present

## 2018-09-03 DIAGNOSIS — E039 Hypothyroidism, unspecified: Secondary | ICD-10-CM | POA: Diagnosis not present

## 2018-09-03 DIAGNOSIS — G4700 Insomnia, unspecified: Secondary | ICD-10-CM | POA: Diagnosis present

## 2018-09-03 DIAGNOSIS — H538 Other visual disturbances: Secondary | ICD-10-CM | POA: Diagnosis not present

## 2018-09-03 DIAGNOSIS — R0689 Other abnormalities of breathing: Secondary | ICD-10-CM | POA: Diagnosis not present

## 2018-09-03 DIAGNOSIS — Z87891 Personal history of nicotine dependence: Secondary | ICD-10-CM | POA: Insufficient documentation

## 2018-09-03 LAB — BASIC METABOLIC PANEL
ANION GAP: 10 (ref 5–15)
BUN: 9 mg/dL (ref 8–23)
CHLORIDE: 98 mmol/L (ref 98–111)
CO2: 22 mmol/L (ref 22–32)
Calcium: 8.5 mg/dL — ABNORMAL LOW (ref 8.9–10.3)
Creatinine, Ser: 0.95 mg/dL (ref 0.44–1.00)
GFR calc Af Amer: >60 mL/min (ref >60)
GFR calc non Af Amer: 59 mL/min — ABNORMAL LOW (ref >60)
GLUCOSE: 126 mg/dL — AB (ref 70–99)
Potassium: 3.6 mmol/L (ref 3.5–5.1)
Sodium: 130 mmol/L — ABNORMAL LOW (ref 135–145)

## 2018-09-03 LAB — CBC WITH DIFFERENTIAL/PLATELET
Abs Immature Granulocytes: 0.04 10*3/uL (ref 0.00–0.07)
Basophils Absolute: 0 10*3/uL (ref 0.0–0.1)
Basophils Relative: 0 %
EOS ABS: 0 10*3/uL (ref 0.0–0.5)
EOS PCT: 0 %
HCT: 39.6 % (ref 36.0–46.0)
HEMOGLOBIN: 12.1 g/dL (ref 12.0–15.0)
Immature Granulocytes: 0 %
LYMPHS PCT: 11 %
Lymphs Abs: 1 10*3/uL (ref 0.7–4.0)
MCH: 26.8 pg (ref 26.0–34.0)
MCHC: 30.6 g/dL (ref 30.0–36.0)
MCV: 87.8 fL (ref 80.0–100.0)
MONOS PCT: 9 %
Monocytes Absolute: 0.8 10*3/uL (ref 0.1–1.0)
NEUTROS PCT: 80 %
Neutro Abs: 7.6 10*3/uL (ref 1.7–7.7)
Platelets: 274 10*3/uL (ref 150–400)
RBC: 4.51 MIL/uL (ref 3.87–5.11)
RDW: 14.2 % (ref 11.5–15.5)
WBC: 9.4 10*3/uL (ref 4.0–10.5)
nRBC: 0 % (ref 0.0–0.2)

## 2018-09-03 LAB — HEPATIC FUNCTION PANEL
ALBUMIN: 3.5 g/dL (ref 3.5–5.0)
ALT: 20 U/L (ref 0–44)
AST: 22 U/L (ref 15–41)
Alkaline Phosphatase: 154 U/L — ABNORMAL HIGH (ref 38–126)
BILIRUBIN DIRECT: 0.1 mg/dL (ref 0.0–0.2)
BILIRUBIN TOTAL: 0.6 mg/dL (ref 0.3–1.2)
Indirect Bilirubin: 0.5 mg/dL (ref 0.3–0.9)
Total Protein: 7.4 g/dL (ref 6.5–8.1)

## 2018-09-03 LAB — URINALYSIS, ROUTINE W REFLEX MICROSCOPIC
BILIRUBIN URINE: NEGATIVE
Glucose, UA: NEGATIVE mg/dL
HGB URINE DIPSTICK: NEGATIVE
KETONES UR: NEGATIVE mg/dL
NITRITE: NEGATIVE
PH: 5 (ref 5.0–8.0)
PROTEIN: NEGATIVE mg/dL
Specific Gravity, Urine: 1.009 (ref 1.005–1.030)

## 2018-09-03 LAB — I-STAT CG4 LACTIC ACID, ED: LACTIC ACID, VENOUS: 1.15 mmol/L (ref 0.5–1.9)

## 2018-09-03 MED ORDER — ACETAMINOPHEN 500 MG PO TABS
1000.0000 mg | ORAL_TABLET | Freq: Once | ORAL | Status: AC
  Administered 2018-09-03: 1000 mg via ORAL
  Filled 2018-09-03: qty 2

## 2018-09-03 MED ORDER — SODIUM CHLORIDE 0.9 % IV SOLN
1.0000 g | Freq: Once | INTRAVENOUS | Status: AC
  Administered 2018-09-03: 1 g via INTRAVENOUS
  Filled 2018-09-03: qty 10

## 2018-09-03 MED ORDER — LORAZEPAM 2 MG/ML IJ SOLN
1.0000 mg | Freq: Once | INTRAMUSCULAR | Status: AC
  Administered 2018-09-03: 1 mg via INTRAVENOUS
  Filled 2018-09-03: qty 1

## 2018-09-03 MED ORDER — CEPHALEXIN 500 MG PO CAPS: 500.0000 mg | ORAL_CAPSULE | Freq: Four times a day (QID) | ORAL | 0 refills | Status: DC

## 2018-09-03 MED ORDER — SODIUM CHLORIDE 0.9 % IV BOLUS
1000.0000 mL | Freq: Once | INTRAVENOUS | Status: AC
  Administered 2018-09-03: 1000 mL via INTRAVENOUS

## 2018-09-03 MED ORDER — LORAZEPAM 1 MG PO TABS: 1.0000 mg | ORAL_TABLET | Freq: Three times a day (TID) | ORAL | 0 refills | Status: DC | PRN

## 2018-09-03 NOTE — ED Notes (Signed)
Pt ambulated, steady gate and no complaints.

## 2018-09-03 NOTE — ED Provider Notes (Signed)
[facility] Department EMERGENCY DEPARTMENT Provider Note   CSN: [id number] Arrival date & time: 09/03/18  1120     History   Chief Complaint No chief complaint on file.   HPI Virginia [name] is a 72 y.o. female.  Pt presents to the ED today with not feeling right.  She feels like her brain is fuzzy.  She has not been able to sleep.  She said her mind won't slow down.  She denies any pain.  She has had some urinary incontinence.  She was unaware she had a fever, so has not taken any tylenol/ibuprofen.  She did take some Xanax around 0600 to try to sleep.      Past Medical History:  Diagnosis Date  . Anxiety   . Depression   . HTN (hypertension)    resolved with weight loss s/p gastric bypass  . Hypothyroidism    Since at least 2007  . Osteoporosis   . RLS (restless legs syndrome)   . Sleep apnea   . Stroke [facility])    2013/February 2017    Patient Active Problem List   Diagnosis Date Noted  . RLS (restless legs syndrome) 01/22/2018  . History of colonic polyps   . Constipation 05/11/2016  . Gait difficulty 01/04/2016  . Aphasia 01/04/2016  . Acute CVA (cerebrovascular accident) ([place]) 01/04/2016  . CVA (cerebral infarction) 01/04/2016  . Ataxia   . OSA on CPAP 12/02/2013  . CTS (carpal tunnel syndrome) 03/18/2013  . Wrist fracture 02/04/2013  . Distal radius fracture 12/26/2012  . Hyperlipidemia 06/30/2012  . Elevated blood pressure reading without diagnosis of hypertension 06/29/2012  . Stroke ([place]) 06/28/2012  . Hypothyroidism 06/28/2012  . Anxiety 06/28/2012  . Personal history of colonic polyps 04/26/2011  . Family history of colonic polyps 04/26/2011  . ARTHRITIS, LEFT KNEE 06/06/2010  . DERANGEMENT OF POSTERIOR HORN OF MEDIAL MENISCUS 06/06/2010  . ANEMIA, B12 DEFICIENCY 02/07/2010  . TRIGGER FINGER, THUMB 02/07/2010  . SHOULDER PAIN 10/14/2008  . BURSITIS, SHOULDER 10/14/2008  . TRIGGER FINGER 06/15/2008    Past Surgical History:  Procedure Laterality  Date  . BREAST BIOPSY Right    benign  . CATARACT EXTRACTION  2018  . CHOLECYSTECTOMY    . COLONOSCOPY  2004   internal hemorrhoids, left-sided diverticulae, splenic flexure polyp (59mm), path unavailable at this time  . COLONOSCOPY  04/2011   RMR: Internal and external hemorrhoids, rectal tubular adenoma removed, left-sided diverticulosis  . COLONOSCOPY N/A 06/01/2016   Procedure: COLONOSCOPY;  Surgeon: Daneil Dolin, MD;  Location: [facility]O [facility]ITE;  Service: Endoscopy;  Laterality: N/A;  0830  . ESOPHAGOGASTRODUODENOSCOPY  2004   small hiatal hernia  . GASTRIC ROUX-EN-Y  2006     OB History   None      Home Medications    Prior to Admission medications   Medication Sig Start Date End Date Taking? Authorizing Provider  ALPRAZolam Duanne Moron) 0.5 MG tablet Take 0.5 mg by mouth daily as needed. For anxiety and/or sleep 03/27/11  Yes [provider]  aspirin EC 81 MG tablet Take 81 mg by mouth daily.   Yes [provider]  atorvastatin (LIPITOR) 10 MG tablet Take 1 tablet (10 mg total) by mouth daily. 01/22/18  Yes Dohmeier, Asencion Partridge, MD  b complex vitamins tablet Take 1 tablet by mouth daily.   Yes [provider]  busPIRone (BUSPAR) 5 MG tablet Take 5 mg by mouth 2 (two) times daily.  04/30/16  Yes [provider]  Calcium Carbonate-Vit D-Min (CALCIUM 1200 PO) Take 1,200 mg by mouth daily.   Yes [provider]  Cholecalciferol (VITAMIN D-3) 1000 UNITS CAPS Take 1,000 Units by mouth daily.   Yes [provider]  Cyanocobalamin (VITAMIN B-12 PO) Take 1 tablet by mouth every morning.   Yes [provider]  DULoxetine (CYMBALTA) 60 MG capsule Take 60 mg by mouth daily.   Yes [provider]  losartan (COZAAR) 100 MG tablet TAKE 1 TABLET BY MOUTH EVERY DAY 07/31/18  Yes Herminio Commons, MD  Multiple Vitamin (MULTIVITAMIN WITH MINERALS) TABS Take 1 tablet by mouth every morning.   Yes [provider]  Multiple  Vitamins-Minerals (ICAPS AREDS 2 PO) Take 2 capsules by mouth daily.   Yes [provider]  rOPINIRole (REQUIP) 0.25 MG tablet Take 1 tablet (0.25 mg total) by mouth at bedtime. 01/22/18  Yes Dohmeier, Asencion Partridge, MD  SYNTHROID 175 MCG tablet Take 175 mcg by mouth daily. 08/05/18  Yes [provider]  cephALEXin (KEFLEX) 500 MG capsule Take 1 capsule (500 mg total) by mouth 4 (four) times daily. 09/03/18   Isla Pence, MD  LORazepam (ATIVAN) 1 MG tablet Take 1 tablet (1 mg total) by mouth 3 (three) times daily as needed for anxiety. 09/03/18   Isla Pence, MD    Family History Family History  Problem Relation Age of Onset  . Lung cancer Father   . Stroke Father   . Stroke Mother   . Stroke Brother   . Hypertension Brother   . Stroke Maternal Grandmother   . Stroke Maternal Grandfather   . Cancer Unknown        family history   . Coronary artery disease Unknown        family history   . Arthritis Unknown        family history   . Cancer Daughter     Social History Social History   Tobacco Use  . Smoking status: Former Smoker    Last attempt to quit: 11/28/1987    Years since quitting: 30.7  . Smokeless tobacco: Never Used  . Tobacco comment: Quit x 25 plus years  Substance Use Topics  . Alcohol use: No    Alcohol/week: 0.0 standard drinks  . Drug use: No     Allergies   Amoxicillin-pot clavulanate   Review of Systems Review of Systems  Constitutional: Positive for fever.  Genitourinary:       Urinary incontinence  Psychiatric/Behavioral: Positive for sleep disturbance. The patient is nervous/anxious.   All other systems reviewed and are negative.    Physical Exam Updated Vital Signs BP (!) 153/82   Pulse 85   Temp 97.7 F (36.5 C) (Oral)   Resp (!) 21   Ht 5\' 3"  (1.6 m)   Wt 89.8 kg   SpO2 98%   BMI 35.07 kg/m   Physical Exam  Constitutional: She is oriented to person, place, and time. She appears well-developed and well-nourished.    HENT:  Head: Normocephalic and atraumatic.  Right Ear: External ear normal.  Left Ear: External ear normal.  Nose: Nose normal.  Mouth/Throat: Oropharynx is clear and moist.  Eyes: Pupils are equal, round, and reactive to light. Conjunctivae and EOM are normal.  Neck: Normal range of motion. Neck supple.  Cardiovascular: Normal rate, regular rhythm, normal heart sounds and intact distal pulses.  Pulmonary/Chest: Effort normal and breath sounds normal.  Abdominal: Soft. Bowel sounds are normal.  Musculoskeletal: Normal range of motion.  Neurological: She is alert and oriented to person, place, and time.  Skin: Skin is warm. Capillary refill takes less than 2 seconds.  Psychiatric: Her mood appears anxious.  Nursing note and vitals reviewed.    ED Treatments / Results  Labs (all labs ordered are listed, but only abnormal results are displayed) Labs Reviewed  URINALYSIS, ROUTINE W REFLEX MICROSCOPIC - Abnormal; Notable for the following components:      Result Value   Color, Urine AMBER (*)    APPearance CLOUDY (*)    Leukocytes, UA MODERATE (*)    Bacteria, UA MANY (*)    Non Squamous Epithelial 0-5 (*)    All other components within normal limits  BASIC METABOLIC PANEL - Abnormal; Notable for the following components:   Sodium 130 (*)    Glucose, Bld 126 (*)    Calcium 8.5 (*)    GFR calc non Af Amer 59 (*)    All other components within normal limits  HEPATIC FUNCTION PANEL - Abnormal; Notable for the following components:   Alkaline Phosphatase 154 (*)    All other components within normal limits  CBC WITH DIFFERENTIAL/PLATELET  I-STAT CG4 LACTIC ACID, ED    EKG EKG Interpretation  Date/Time:  Tuesday September 03 2018 11:26:17 EDT Ventricular Rate:  96 PR Interval:    QRS Duration: 88 QT Interval:  337 QTC Calculation: 426 R Axis:   -14 Text Interpretation:  Sinus rhythm Low voltage, precordial leads No significant change since last tracing Confirmed by  Isla Pence [phone]) on 09/03/2018 12:11:35 PM   Radiology Dg Chest 2 View  Result Date: 09/03/2018 CLINICAL DATA:  Fever EXAM: CHEST - 2 VIEW COMPARISON:  01/05/2016 FINDINGS: The heart size and mediastinal contours are within normal limits. Both lungs are clear. The visualized skeletal structures are unremarkable. IMPRESSION: No active cardiopulmonary disease. Electronically Signed   By: Kathreen Devoid   On: 09/03/2018 13:19   Ct Head Wo Contrast  Result Date: 09/03/2018 CLINICAL DATA:  72 year old female with acute headache for 2 days. EXAM: CT HEAD WITHOUT CONTRAST TECHNIQUE: Contiguous axial images were obtained from the base of the skull through the vertex without intravenous contrast. COMPARISON:  12/07/2016 head CT and prior studies FINDINGS: Brain: No evidence of acute infarction, hemorrhage, hydrocephalus, extra-axial collection or mass lesion/mass effect. Chronic small-vessel white matter ischemic changes are again noted, LEFT-greater-than-RIGHT. Vascular: Atherosclerotic vascular calcifications noted. Skull: Normal. Negative for fracture or focal lesion. Sinuses/Orbits: No acute finding. Other: None. IMPRESSION: 1. No evidence of acute intracranial abnormality 2. Chronic small-vessel white matter ischemic changes. Electronically Signed   By: Margarette Canada M.D.   On: 09/03/2018 13:24    Procedures Procedures (including critical care time)  Medications Ordered in ED Medications  sodium chloride 0.9 % bolus 1,000 mL (0 mLs Intravenous Stopped 09/03/18 1410)  acetaminophen (TYLENOL) tablet 1,000 mg (1,000 mg Oral Given 09/03/18 1305)  LORazepam (ATIVAN) injection 1 mg (1 mg Intravenous Given 09/03/18 1307)  cefTRIAXone (ROCEPHIN) 1 g in sodium chloride 0.9 % 100 mL IVPB (0 g Intravenous Stopped 09/03/18 1426)     Initial Impression / Assessment and Plan / ED Course  I have reviewed the triage vital signs and the nursing notes.  Pertinent labs & imaging results that were available during  my care of the patient were reviewed by me and considered in my medical decision making (see chart for details).     Pt is feeling much better.  She was given a dose of rocephin  in the ED and fever was treated.  She was given IVFs and ativan.  She was able to ambulate without problems.  She will be d/c home with keflex and ativan.  Return if worse.  F/u with pcp.  Final Clinical Impressions(s) / ED Diagnoses   Final diagnoses:  Acute cystitis without hematuria  Fever, unspecified fever cause  Anxiety    ED Discharge Orders         Ordered    cephALEXin (KEFLEX) 500 MG capsule  4 times daily     09/03/18 1529    LORazepam (ATIVAN) 1 MG tablet  3 times daily PRN     09/03/18 1529           Isla Pence, MD 09/03/18 1531

## 2018-09-03 NOTE — ED Triage Notes (Signed)
Pt reports Monday she started feeling like her brain was "getting fuzzy."  Reports hasn't slept in a couple of days.  Pt says she slept for 2 hours last night so she took 2 xanax around 0600 to help her sleep.  Pt tearful.  Pt says she has "  A million things going on in my mind, it won't slow down."  Pt reports headache.  Reports inability to concentrate as usual.

## 2018-09-04 ENCOUNTER — Ambulatory Visit: Payer: Medicare Other | Admitting: Cardiovascular Disease

## 2018-09-09 ENCOUNTER — Encounter (INDEPENDENT_AMBULATORY_CARE_PROVIDER_SITE_OTHER): Payer: Medicare Other | Admitting: Ophthalmology

## 2018-09-09 DIAGNOSIS — H35342 Macular cyst, hole, or pseudohole, left eye: Secondary | ICD-10-CM

## 2018-09-09 DIAGNOSIS — H35033 Hypertensive retinopathy, bilateral: Secondary | ICD-10-CM

## 2018-09-09 DIAGNOSIS — I10 Essential (primary) hypertension: Secondary | ICD-10-CM

## 2018-09-09 DIAGNOSIS — H43813 Vitreous degeneration, bilateral: Secondary | ICD-10-CM

## 2018-09-09 DIAGNOSIS — H353112 Nonexudative age-related macular degeneration, right eye, intermediate dry stage: Secondary | ICD-10-CM | POA: Diagnosis not present

## 2018-09-16 DIAGNOSIS — Z6835 Body mass index (BMI) 35.0-35.9, adult: Secondary | ICD-10-CM | POA: Diagnosis not present

## 2018-09-16 DIAGNOSIS — E871 Hypo-osmolality and hyponatremia: Secondary | ICD-10-CM | POA: Diagnosis not present

## 2018-09-16 DIAGNOSIS — I10 Essential (primary) hypertension: Secondary | ICD-10-CM | POA: Diagnosis not present

## 2018-09-16 DIAGNOSIS — Z23 Encounter for immunization: Secondary | ICD-10-CM | POA: Diagnosis not present

## 2018-09-16 DIAGNOSIS — N390 Urinary tract infection, site not specified: Secondary | ICD-10-CM | POA: Diagnosis not present

## 2018-09-16 DIAGNOSIS — E669 Obesity, unspecified: Secondary | ICD-10-CM | POA: Diagnosis not present

## 2018-09-16 DIAGNOSIS — Z1389 Encounter for screening for other disorder: Secondary | ICD-10-CM | POA: Diagnosis not present

## 2018-09-16 DIAGNOSIS — E782 Mixed hyperlipidemia: Secondary | ICD-10-CM | POA: Diagnosis not present

## 2018-09-16 DIAGNOSIS — M1991 Primary osteoarthritis, unspecified site: Secondary | ICD-10-CM | POA: Diagnosis not present

## 2018-11-06 ENCOUNTER — Other Ambulatory Visit (HOSPITAL_COMMUNITY): Payer: Self-pay | Admitting: Internal Medicine

## 2018-11-06 DIAGNOSIS — Z1231 Encounter for screening mammogram for malignant neoplasm of breast: Secondary | ICD-10-CM

## 2018-11-07 DIAGNOSIS — L814 Other melanin hyperpigmentation: Secondary | ICD-10-CM | POA: Diagnosis not present

## 2018-11-07 DIAGNOSIS — L821 Other seborrheic keratosis: Secondary | ICD-10-CM | POA: Diagnosis not present

## 2018-11-07 DIAGNOSIS — D1801 Hemangioma of skin and subcutaneous tissue: Secondary | ICD-10-CM | POA: Diagnosis not present

## 2018-11-11 ENCOUNTER — Ambulatory Visit (HOSPITAL_COMMUNITY)
Admission: RE | Admit: 2018-11-11 | Discharge: 2018-11-11 | Disposition: A | Discharge status: Home / Self Care | Payer: Medicare Other | Source: Ambulatory Visit | Attending: Internal Medicine | Admitting: Internal Medicine

## 2018-11-11 DIAGNOSIS — Z1231 Encounter for screening mammogram for malignant neoplasm of breast: Secondary | ICD-10-CM

## 2018-12-06 DIAGNOSIS — Z6836 Body mass index (BMI) 36.0-36.9, adult: Secondary | ICD-10-CM | POA: Diagnosis not present

## 2018-12-06 DIAGNOSIS — G2581 Restless legs syndrome: Secondary | ICD-10-CM | POA: Diagnosis not present

## 2018-12-06 DIAGNOSIS — Z1389 Encounter for screening for other disorder: Secondary | ICD-10-CM | POA: Diagnosis not present

## 2018-12-06 DIAGNOSIS — M1991 Primary osteoarthritis, unspecified site: Secondary | ICD-10-CM | POA: Diagnosis not present

## 2018-12-06 DIAGNOSIS — E782 Mixed hyperlipidemia: Secondary | ICD-10-CM | POA: Diagnosis not present

## 2018-12-06 DIAGNOSIS — I10 Essential (primary) hypertension: Secondary | ICD-10-CM | POA: Diagnosis not present

## 2019-01-22 ENCOUNTER — Ambulatory Visit: Payer: Medicare Other | Admitting: Adult Health

## 2019-04-15 DIAGNOSIS — Z681 Body mass index (BMI) 19 or less, adult: Secondary | ICD-10-CM | POA: Diagnosis not present

## 2019-04-15 DIAGNOSIS — Z0000 Encounter for general adult medical examination without abnormal findings: Secondary | ICD-10-CM | POA: Diagnosis not present

## 2019-05-15 IMAGING — DX DG CHEST 2V
2 series · 2 of 2 positions shown · non-contrast
Comparison: 01/05/2016

CLINICAL DATA: Fever

EXAM:
CHEST - 2 VIEW

[chest pa]
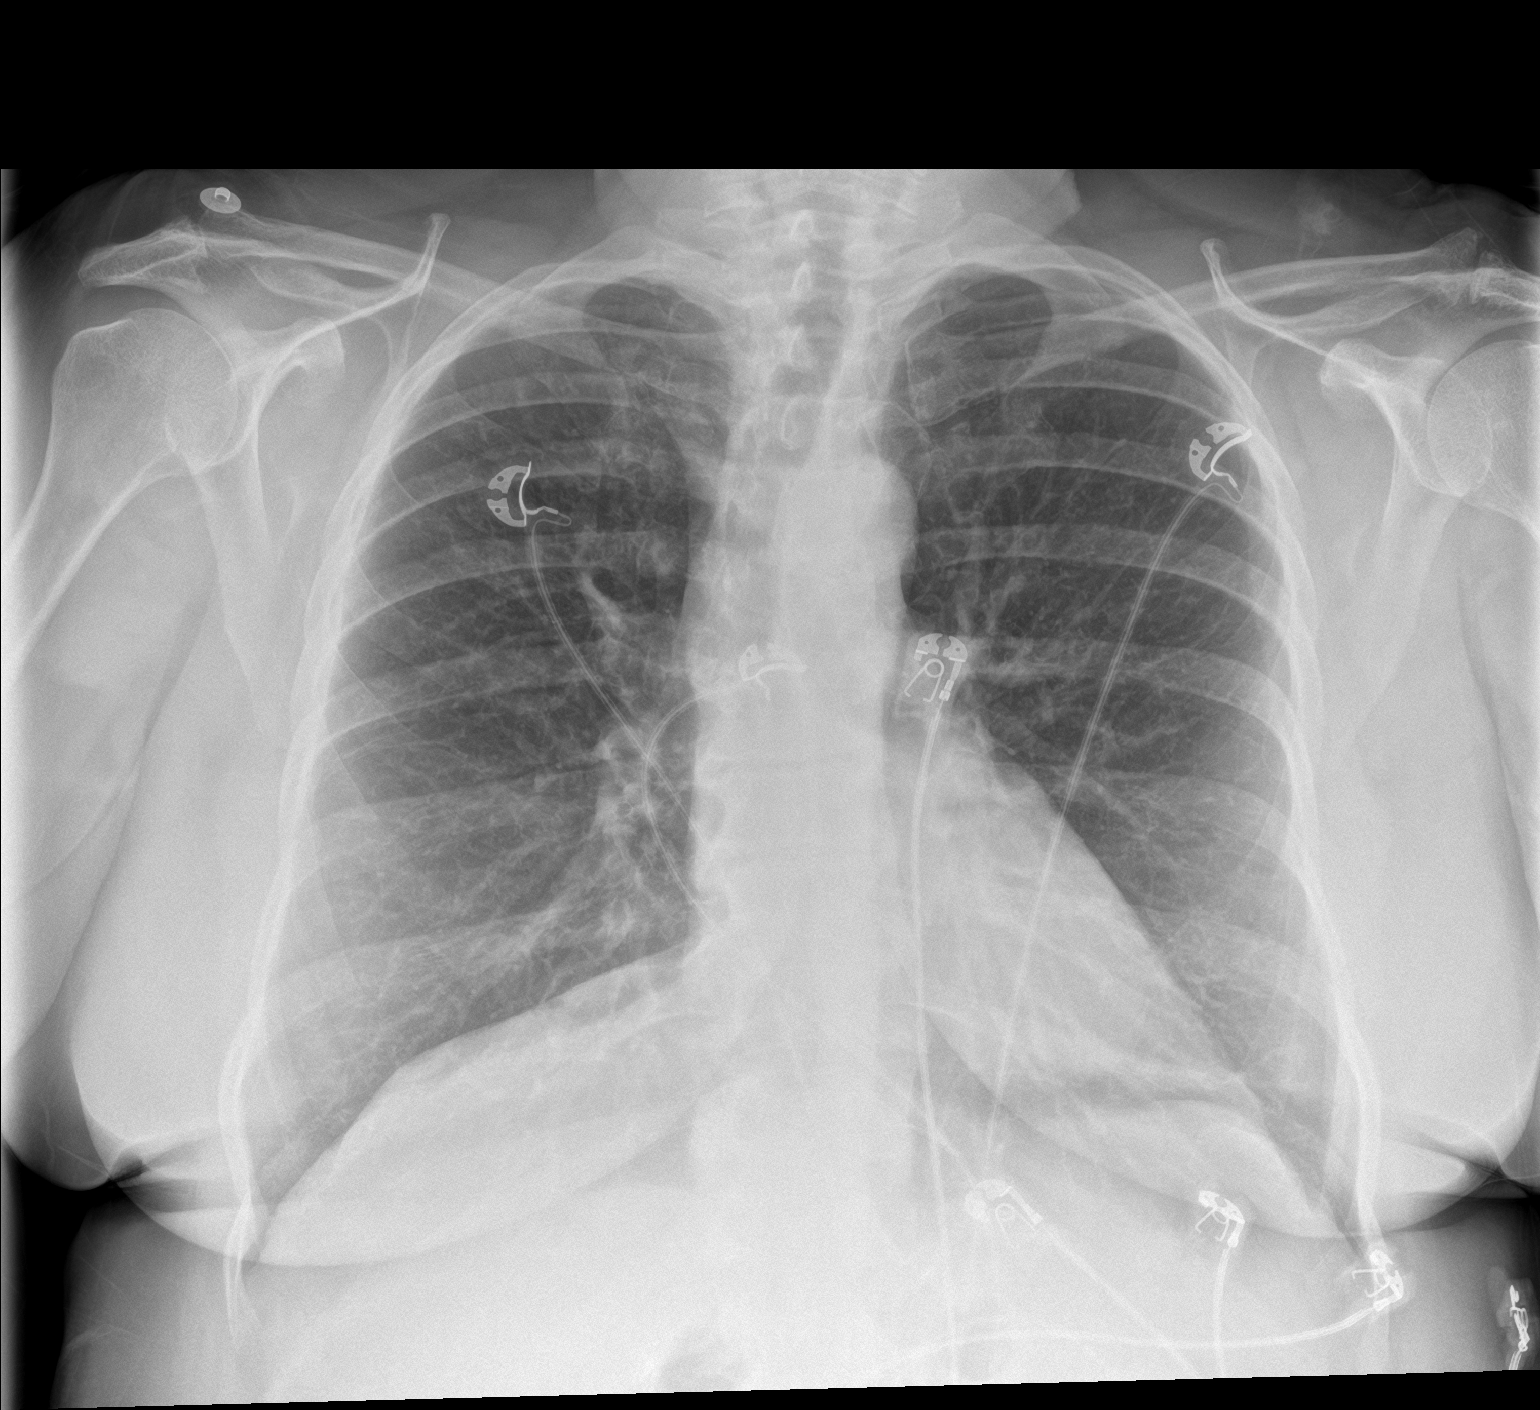

[chest lat]
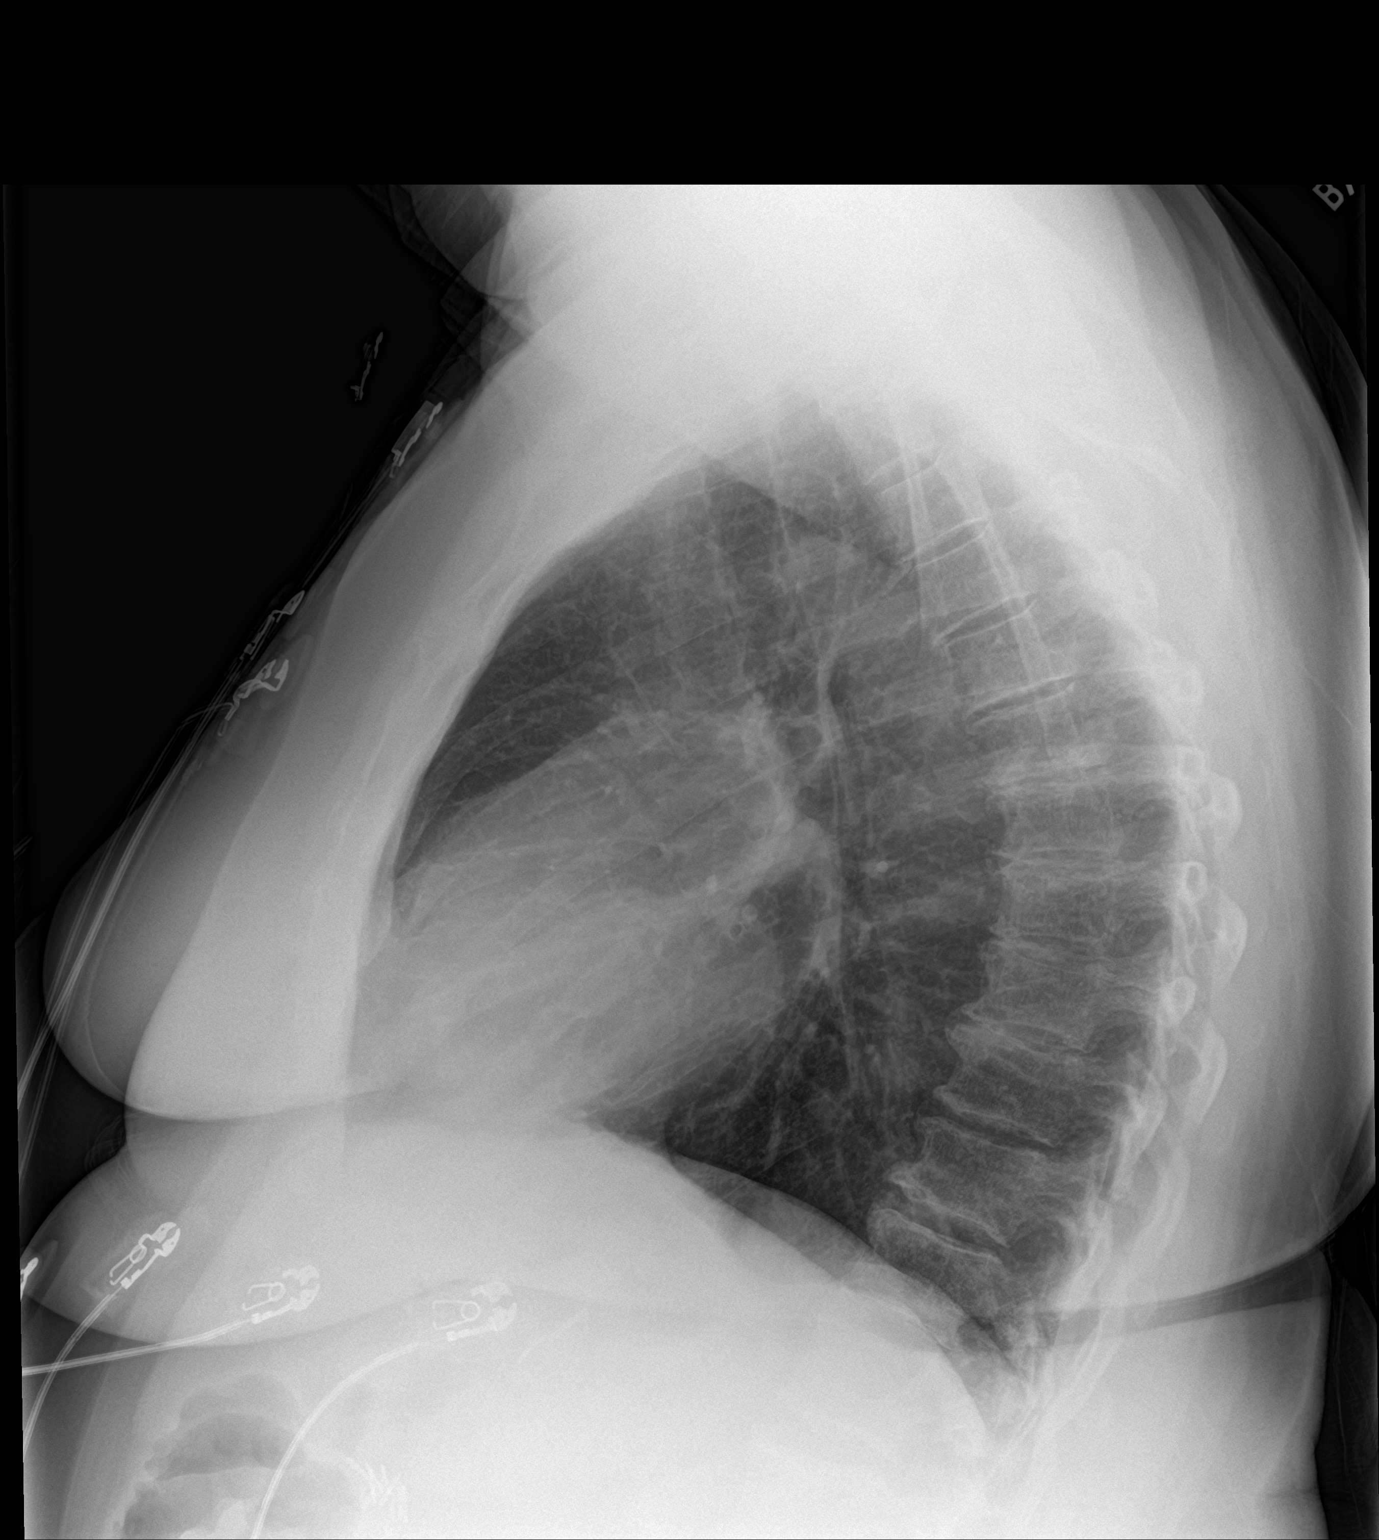

[2 of 2 positions shown; findings below may reference images not displayed]

FINDINGS: The heart size and mediastinal contours are within normal limits.
Both lungs are clear. The visualized skeletal structures are
unremarkable.
IMPRESSION: No active cardiopulmonary disease.

## 2019-06-11 ENCOUNTER — Encounter (INDEPENDENT_AMBULATORY_CARE_PROVIDER_SITE_OTHER): Payer: Medicare Other | Admitting: Ophthalmology

## 2019-06-11 ENCOUNTER — Other Ambulatory Visit: Payer: Self-pay

## 2019-06-11 DIAGNOSIS — H353132 Nonexudative age-related macular degeneration, bilateral, intermediate dry stage: Secondary | ICD-10-CM

## 2019-06-11 DIAGNOSIS — H43813 Vitreous degeneration, bilateral: Secondary | ICD-10-CM | POA: Diagnosis not present

## 2019-06-11 DIAGNOSIS — H35033 Hypertensive retinopathy, bilateral: Secondary | ICD-10-CM | POA: Diagnosis not present

## 2019-06-11 DIAGNOSIS — I10 Essential (primary) hypertension: Secondary | ICD-10-CM

## 2019-06-11 DIAGNOSIS — H35342 Macular cyst, hole, or pseudohole, left eye: Secondary | ICD-10-CM

## 2019-08-04 ENCOUNTER — Other Ambulatory Visit: Payer: Self-pay | Admitting: Cardiovascular Disease

## 2019-08-06 DIAGNOSIS — E063 Autoimmune thyroiditis: Secondary | ICD-10-CM | POA: Diagnosis not present

## 2019-08-06 DIAGNOSIS — I10 Essential (primary) hypertension: Secondary | ICD-10-CM | POA: Diagnosis not present

## 2019-08-06 DIAGNOSIS — Z8673 Personal history of transient ischemic attack (TIA), and cerebral infarction without residual deficits: Secondary | ICD-10-CM | POA: Diagnosis not present

## 2019-08-06 DIAGNOSIS — D509 Iron deficiency anemia, unspecified: Secondary | ICD-10-CM | POA: Diagnosis not present

## 2019-09-04 DIAGNOSIS — L298 Other pruritus: Secondary | ICD-10-CM | POA: Diagnosis not present

## 2019-09-04 DIAGNOSIS — E063 Autoimmune thyroiditis: Secondary | ICD-10-CM | POA: Diagnosis not present

## 2019-09-04 DIAGNOSIS — M1991 Primary osteoarthritis, unspecified site: Secondary | ICD-10-CM | POA: Diagnosis not present

## 2019-09-04 DIAGNOSIS — I10 Essential (primary) hypertension: Secondary | ICD-10-CM | POA: Diagnosis not present

## 2019-09-05 DIAGNOSIS — M1991 Primary osteoarthritis, unspecified site: Secondary | ICD-10-CM | POA: Diagnosis not present

## 2019-09-05 DIAGNOSIS — D649 Anemia, unspecified: Secondary | ICD-10-CM | POA: Diagnosis not present

## 2019-09-05 DIAGNOSIS — D509 Iron deficiency anemia, unspecified: Secondary | ICD-10-CM | POA: Diagnosis not present

## 2019-09-05 DIAGNOSIS — I10 Essential (primary) hypertension: Secondary | ICD-10-CM | POA: Diagnosis not present

## 2019-09-05 DIAGNOSIS — E063 Autoimmune thyroiditis: Secondary | ICD-10-CM | POA: Diagnosis not present

## 2019-09-05 DIAGNOSIS — E559 Vitamin D deficiency, unspecified: Secondary | ICD-10-CM | POA: Diagnosis not present

## 2019-09-12 DIAGNOSIS — Z23 Encounter for immunization: Secondary | ICD-10-CM | POA: Diagnosis not present

## 2019-10-07 DIAGNOSIS — H353110 Nonexudative age-related macular degeneration, right eye, stage unspecified: Secondary | ICD-10-CM | POA: Diagnosis not present

## 2019-12-02 ENCOUNTER — Other Ambulatory Visit: Payer: Self-pay

## 2019-12-02 ENCOUNTER — Ambulatory Visit: Payer: Medicare Other | Attending: Internal Medicine

## 2019-12-02 DIAGNOSIS — Z20822 Contact with and (suspected) exposure to covid-19: Secondary | ICD-10-CM | POA: Diagnosis not present

## 2019-12-03 LAB — NOVEL CORONAVIRUS, NAA: SARS-CoV-2, NAA: DETECTED — AB

## 2019-12-04 ENCOUNTER — Telehealth: Payer: Self-pay | Admitting: Nurse Practitioner

## 2019-12-04 NOTE — Telephone Encounter (Signed)
Called to Discuss with patient about Covid symptoms and the use of bamlanivimab, a monoclonal antibody infusion for those with mild to moderate Covid symptoms and at a high risk of hospitalization.     Pt is qualified for this infusion at the [facility] infusion center due to co-morbid conditions and/or a member of an at-risk group.     Patient Active Problem List   Diagnosis Date Noted  . RLS (restless legs syndrome) 01/22/2018  . History of colonic polyps   . Constipation 05/11/2016  . Gait difficulty 01/04/2016  . Aphasia 01/04/2016  . Acute CVA (cerebrovascular accident) ([place]) 01/04/2016  . CVA (cerebral infarction) 01/04/2016  . Ataxia   . OSA on CPAP 12/02/2013  . CTS (carpal tunnel syndrome) 03/18/2013  . Wrist fracture 02/04/2013  . Distal radius fracture 12/26/2012  . Hyperlipidemia 06/30/2012  . Elevated blood pressure reading without diagnosis of hypertension 06/29/2012  . Stroke ([place]) 06/28/2012  . Hypothyroidism 06/28/2012  . Anxiety 06/28/2012  . Personal history of colonic polyps 04/26/2011  . Family history of colonic polyps 04/26/2011  . ARTHRITIS, LEFT KNEE 06/06/2010  . DERANGEMENT OF POSTERIOR HORN OF MEDIAL MENISCUS 06/06/2010  . ANEMIA, B12 DEFICIENCY 02/07/2010  . TRIGGER FINGER, THUMB 02/07/2010  . SHOULDER PAIN 10/14/2008  . BURSITIS, SHOULDER 10/14/2008  . TRIGGER FINGER 06/15/2008    Patient declines infusion at this time. Symptoms tier reviewed as well as criteria for ending isolation. Preventative practices reviewed. Patient verbalized understanding.    Patient advised to call back if he decides that he does want to get infusion. Callback number to the infusion center given. Patient advised to go to Urgent care or ED with severe symptoms.

## 2019-12-12 DIAGNOSIS — H9190 Unspecified hearing loss, unspecified ear: Secondary | ICD-10-CM | POA: Diagnosis not present

## 2019-12-12 DIAGNOSIS — F341 Dysthymic disorder: Secondary | ICD-10-CM | POA: Diagnosis not present

## 2019-12-12 DIAGNOSIS — H903 Sensorineural hearing loss, bilateral: Secondary | ICD-10-CM | POA: Diagnosis not present

## 2019-12-12 DIAGNOSIS — E063 Autoimmune thyroiditis: Secondary | ICD-10-CM | POA: Diagnosis not present

## 2019-12-12 DIAGNOSIS — I10 Essential (primary) hypertension: Secondary | ICD-10-CM | POA: Diagnosis not present

## 2019-12-12 DIAGNOSIS — D509 Iron deficiency anemia, unspecified: Secondary | ICD-10-CM | POA: Diagnosis not present

## 2019-12-12 DIAGNOSIS — Z6834 Body mass index (BMI) 34.0-34.9, adult: Secondary | ICD-10-CM | POA: Diagnosis not present

## 2019-12-26 ENCOUNTER — Other Ambulatory Visit (HOSPITAL_COMMUNITY): Payer: Self-pay | Admitting: Internal Medicine

## 2019-12-26 DIAGNOSIS — Z1231 Encounter for screening mammogram for malignant neoplasm of breast: Secondary | ICD-10-CM

## 2020-01-01 ENCOUNTER — Ambulatory Visit (HOSPITAL_COMMUNITY)
Admission: RE | Admit: 2020-01-01 | Discharge: 2020-01-01 | Disposition: A | Discharge status: Home / Self Care | Payer: Medicare Other | Source: Ambulatory Visit | Attending: Internal Medicine | Admitting: Internal Medicine

## 2020-01-01 ENCOUNTER — Other Ambulatory Visit: Payer: Self-pay

## 2020-01-01 DIAGNOSIS — Z1231 Encounter for screening mammogram for malignant neoplasm of breast: Secondary | ICD-10-CM

## 2020-02-23 ENCOUNTER — Telehealth: Payer: Self-pay | Admitting: Orthopedic Surgery

## 2020-02-23 NOTE — Telephone Encounter (Signed)
Patient called stating she was off this coming Wednesday and wanted an appointment to see Dr. Aline Brochure.  I told her that unfortunately he did not have any openings this Wednesday but I would look to see when he first available opening would be.  She told me that she was off this Wednesday and that is why she asked for that day.  I told her that I would be happy to give her a call if someone canceled for that day.  She said she would call us back as soon as she found out what her next day off would be.  I told her that was fine.

## 2020-03-02 ENCOUNTER — Other Ambulatory Visit: Payer: Self-pay

## 2020-03-02 ENCOUNTER — Ambulatory Visit (HOSPITAL_COMMUNITY)
Admission: RE | Admit: 2020-03-02 | Discharge: 2020-03-02 | Disposition: A | Discharge status: Home / Self Care | Payer: Medicare Other | Source: Ambulatory Visit | Attending: Family Medicine | Admitting: Family Medicine

## 2020-03-02 ENCOUNTER — Other Ambulatory Visit (HOSPITAL_COMMUNITY): Payer: Self-pay | Admitting: Family Medicine

## 2020-03-02 ENCOUNTER — Other Ambulatory Visit: Payer: Self-pay | Admitting: Family Medicine

## 2020-03-02 DIAGNOSIS — Z6834 Body mass index (BMI) 34.0-34.9, adult: Secondary | ICD-10-CM | POA: Diagnosis not present

## 2020-03-02 DIAGNOSIS — R519 Headache, unspecified: Secondary | ICD-10-CM

## 2020-03-02 DIAGNOSIS — E6609 Other obesity due to excess calories: Secondary | ICD-10-CM | POA: Diagnosis not present

## 2020-03-02 DIAGNOSIS — R51 Headache with orthostatic component, not elsewhere classified: Secondary | ICD-10-CM | POA: Diagnosis not present

## 2020-03-17 ENCOUNTER — Encounter (INDEPENDENT_AMBULATORY_CARE_PROVIDER_SITE_OTHER): Payer: Medicare Other | Admitting: Ophthalmology

## 2020-03-17 ENCOUNTER — Other Ambulatory Visit: Payer: Self-pay

## 2020-03-17 DIAGNOSIS — H35033 Hypertensive retinopathy, bilateral: Secondary | ICD-10-CM

## 2020-03-17 DIAGNOSIS — H353132 Nonexudative age-related macular degeneration, bilateral, intermediate dry stage: Secondary | ICD-10-CM

## 2020-03-17 DIAGNOSIS — H35342 Macular cyst, hole, or pseudohole, left eye: Secondary | ICD-10-CM

## 2020-03-17 DIAGNOSIS — H26491 Other secondary cataract, right eye: Secondary | ICD-10-CM | POA: Diagnosis not present

## 2020-03-17 DIAGNOSIS — I10 Essential (primary) hypertension: Secondary | ICD-10-CM | POA: Diagnosis not present

## 2020-03-17 DIAGNOSIS — H43813 Vitreous degeneration, bilateral: Secondary | ICD-10-CM | POA: Diagnosis not present

## 2020-04-13 ENCOUNTER — Other Ambulatory Visit: Payer: Self-pay

## 2020-04-13 ENCOUNTER — Encounter (INDEPENDENT_AMBULATORY_CARE_PROVIDER_SITE_OTHER): Payer: Medicare Other | Admitting: Ophthalmology

## 2020-04-13 DIAGNOSIS — H2701 Aphakia, right eye: Secondary | ICD-10-CM

## 2020-06-26 DIAGNOSIS — Z23 Encounter for immunization: Secondary | ICD-10-CM | POA: Diagnosis not present

## 2020-07-24 DIAGNOSIS — Z23 Encounter for immunization: Secondary | ICD-10-CM | POA: Diagnosis not present

## 2020-08-13 DIAGNOSIS — I499 Cardiac arrhythmia, unspecified: Secondary | ICD-10-CM | POA: Diagnosis not present

## 2020-09-03 DIAGNOSIS — I10 Essential (primary) hypertension: Secondary | ICD-10-CM | POA: Diagnosis not present

## 2020-09-03 DIAGNOSIS — Z79899 Other long term (current) drug therapy: Secondary | ICD-10-CM | POA: Diagnosis not present

## 2020-09-07 DIAGNOSIS — F411 Generalized anxiety disorder: Secondary | ICD-10-CM | POA: Diagnosis not present

## 2020-09-07 DIAGNOSIS — R7309 Other abnormal glucose: Secondary | ICD-10-CM | POA: Diagnosis not present

## 2020-09-07 DIAGNOSIS — F331 Major depressive disorder, recurrent, moderate: Secondary | ICD-10-CM | POA: Diagnosis not present

## 2020-09-07 DIAGNOSIS — I639 Cerebral infarction, unspecified: Secondary | ICD-10-CM | POA: Diagnosis not present

## 2020-09-21 DIAGNOSIS — I639 Cerebral infarction, unspecified: Secondary | ICD-10-CM | POA: Diagnosis not present

## 2020-09-29 ENCOUNTER — Other Ambulatory Visit: Payer: Self-pay

## 2020-09-29 ENCOUNTER — Ambulatory Visit (INDEPENDENT_AMBULATORY_CARE_PROVIDER_SITE_OTHER): Payer: Medicare Other | Admitting: Podiatry

## 2020-09-29 ENCOUNTER — Ambulatory Visit: Payer: Medicare Other | Admitting: Podiatry

## 2020-09-29 ENCOUNTER — Ambulatory Visit (INDEPENDENT_AMBULATORY_CARE_PROVIDER_SITE_OTHER): Payer: Medicare Other

## 2020-09-29 ENCOUNTER — Encounter: Payer: Self-pay | Admitting: Podiatry

## 2020-09-29 VITALS — BP 125/68 | HR 73 | Temp 98.3°F

## 2020-09-29 DIAGNOSIS — M2041 Other hammer toe(s) (acquired), right foot: Secondary | ICD-10-CM

## 2020-09-29 DIAGNOSIS — M21619 Bunion of unspecified foot: Secondary | ICD-10-CM

## 2020-09-29 DIAGNOSIS — M21612 Bunion of left foot: Secondary | ICD-10-CM

## 2020-09-29 DIAGNOSIS — M779 Enthesopathy, unspecified: Secondary | ICD-10-CM

## 2020-10-01 NOTE — Progress Notes (Signed)
Subjective:   Patient ID: Virginia [name], female   DOB: 74 y.o.   MRN: [id number]   HPI Patient presents stating that she has developed a lot of pain in her right forefoot and her second toe has lifted more in the air and while its not tender she is concerned about the significant structural deformity she is experiencing.  Patient does not smoke and likes to be active   Review of Systems  All other systems reviewed and are negative.       Objective:  Physical Exam Vitals and nursing note reviewed.  Constitutional:      Appearance: She is well-developed.  Pulmonary:     Effort: Pulmonary effort is normal.  Musculoskeletal:        General: Normal range of motion.  Skin:    General: Skin is warm.  Neurological:     Mental Status: She is alert.     Neurovascular status found to be intact muscle strength is adequate range of motion found to be within normal limits with severe forefoot structural deformity right over left foot with no contact of the second digit with the surface and quite a bit of pain which is more recent in the second metatarsal phalangeal joint of the right foot with inflammation fluid at the joint surface.  Patient is found to have good digital perfusion well oriented     Assessment:  Severe forefoot derangement right with noncontacted second digit and acute inflammatory capsulitis second MPJ right foot with moderate deformity left     Plan:  H&P reviewed both conditions and will get a focus on the acute inflammation and I did discuss surgical correction right but since the foot for the most part does not hurt her we will focus more on the acute discomfort.  Today I did a forefoot block of the area I aspirated the joint getting out a small amount of clear fluid and injected quarter cc dexamethasone Kenalog to reduce inflammation and applied thick pad and advised on rigid shoes.  Discussed structural correction but will get a hold off currently and at 1 point in  the future it may be necessary but I want to see first if I can get her acute symptoms under control  X-rays indicate severe forefoot derangement right over left foot with  significant bunion deformity and elevated second digit right over left foot

## 2020-11-24 DIAGNOSIS — E039 Hypothyroidism, unspecified: Secondary | ICD-10-CM | POA: Diagnosis not present

## 2020-12-28 ENCOUNTER — Encounter: Payer: Self-pay | Admitting: Neurology

## 2020-12-28 ENCOUNTER — Other Ambulatory Visit: Payer: Self-pay

## 2020-12-28 ENCOUNTER — Ambulatory Visit (INDEPENDENT_AMBULATORY_CARE_PROVIDER_SITE_OTHER): Payer: Medicare Other | Admitting: Neurology

## 2020-12-28 VITALS — BP 155/76 | HR 84 | Ht 63.0 in | Wt 194.0 lb

## 2020-12-28 DIAGNOSIS — Z9884 Bariatric surgery status: Secondary | ICD-10-CM | POA: Diagnosis not present

## 2020-12-28 DIAGNOSIS — G4733 Obstructive sleep apnea (adult) (pediatric): Secondary | ICD-10-CM | POA: Diagnosis not present

## 2020-12-28 DIAGNOSIS — Z789 Other specified health status: Secondary | ICD-10-CM

## 2020-12-28 DIAGNOSIS — Z9989 Dependence on other enabling machines and devices: Secondary | ICD-10-CM

## 2020-12-28 DIAGNOSIS — G4761 Periodic limb movement disorder: Secondary | ICD-10-CM | POA: Diagnosis not present

## 2020-12-28 DIAGNOSIS — G2581 Restless legs syndrome: Secondary | ICD-10-CM

## 2020-12-28 DIAGNOSIS — I639 Cerebral infarction, unspecified: Secondary | ICD-10-CM

## 2020-12-28 NOTE — Progress Notes (Addendum)
SLEEP MEDICINE CLINIC    Provider:  Melvyn Novas, MD  Primary Care Physician:  Adella Hare, PA-C [address] [place] [address]     Referring Provider:         Chief Complaint according to patient   Patient presents with:    . New Patient (Initial Visit)           HISTORY OF PRESENT ILLNESS:  Virginia [name] is a 75 - year old White or Caucasian female patient who had been seen here until 2015, several  years ago and now here upon a new referral on 12/28/2020 from her new PCP in [place], [place].    I have the pleasure of seeing Virginia [name] 12-29-2020, a right-handed  Caucasian female with a History of OSA sleep disorder.  She  has a past medical history of Anxiety, Depression, HTN (hypertension), Hypothyroidism, Osteoporosis, RLS (restless legs syndrome), Sleep apnea, and Stroke (HCC). she is a post-gastric bypass patient,  she was non complaint with CPAP therapy and had discontinued the therapy. She was treated last year for anemia and thyroiditis. Mentioning of hearing loss and macular edema.   The patient had her first sleep study with [place] in the year 2013 -but previous sleep studies dated to 2006.She was referred by dr Lesia Sago, MD at [facility] at the time.  The 2013 study ended with a result of an AHI ( Apnea Hypopnea index)  of 17/h, no hypoxemia was noted and frequent PLMs were present.  Under CPAP , the PLMs resolved, but central apnea emerged. The best tolerated pressure was only 6 cm water.      Sleep relevant medical history: Restless leg syndrome, obstructive sleep apnea currently untreated, generalized anxiety disorder, moderate depression, stroke ischemic, and impaired glucose tolerance.  Her new primary care physician has provided alprazolam 0.5 mg when necessary for severe anxiety but also voiced concern about the untreated obstructive sleep apnea. The patient reports severe fatigue. Her hypertension and glucose level seems to be well controlled currently.    She did undergo a gastric bypass surgery and lost 100 pounds the symptoms seem to resolve with weight loss and therefore she stopped using the CPAP therapy but then 3 years ago she had another in-home study not provided through this office and she questions the validity of the study.  She would like to have the sleep study repeated in the proper lab    This home sleep test quoted here was performed on 5-15 2019 and revealed an AHI of 16.8 very close to the baseline of 2013.  She had no significant central apnea proportion.  Obstructive events were 227 of which 40 were obstructive apneas, she did not have significant hypoxemia.  The RDI was 46.7 indicating loud snoring.  The study was performed through [facility] snap [email or web address].  It had been ordered by Elfredia Nevins.      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  Adult daughter with breast cancer , adult grandchildren.  The patient currently works( tax season ).   Sleep habits are as follows: [name] dinner time is between 6 PM. The patient goes to bed at 11 PM and continues to sleep for 5-6 hours, wakes rarely.    The preferred sleep position is laterally, with the support of 1 pillow. Dreams are reportedly infrequent..  6.30  AM is the usual rise time. The  patient wakes up with an alarm.  She reports not feeling refreshed or restored in AM, with symptoms such as dry mouth, some morning headaches and residual fatigue.   Review of Systems: Out of a complete 14 system review, the patient complains of only the following symptoms, and all other reviewed systems are negative.:  Fatigue, sleepiness , snoring, non restorative sleep, achiness,    How likely are you to doze in the following situations: 0 = not likely, 1 = slight chance, 2 = moderate chance, 3 = high chance   Sitting and Reading? Watching Television? Sitting inactive in a public place (theater or meeting)? As  a passenger in a car for an hour without a break? Lying down in the afternoon when circumstances permit? Sitting and talking to someone? Sitting quietly after lunch without alcohol? In a car, while stopped for a few minutes in traffic?   Total = 11/ 24 points   FSS endorsed at 54/ 63 points.   Social History   Socioeconomic History  . Marital status: Divorced    Spouse name: Not on file  . Number of children: 1  . Years of education: college  . Highest education level: Not on file  Occupational History  . Occupation: retired     Associate Professor: RETIRED  Tobacco Use  . Smoking status: Former Smoker    Quit date: 11/28/1987    Years since quitting: 33.1  . Smokeless tobacco: Never Used  . Tobacco comment: Quit x 25 plus years  Vaping Use  . Vaping Use: Never used  Substance and Sexual Activity  . Alcohol use: No    Alcohol/week: 0.0 standard drinks  . Drug use: No  . Sexual activity: Not on file  Other Topics Concern  . Not on file  Social History Narrative   Caffeine 6 cups daily.  Retired/[employer],   2 yrs college,  Divorced, one daughter.   Social Determinants of Health   Financial Resource Strain: Not on file  Food Insecurity: Not on file  Transportation Needs: Not on file  Physical Activity: Not on file  Stress: Not on file  Social Connections: Not on file    Family History  Problem Relation Age of Onset  . Lung cancer Father   . Stroke Father   . Stroke Mother   . Stroke Brother   . Hypertension Brother   . Stroke Maternal Grandmother   . Stroke Maternal Grandfather   . Cancer Unknown        family history   . Coronary artery disease Unknown        family history   . Arthritis Unknown        family history   . Cancer Daughter     Past Medical History:  Diagnosis Date  . Anxiety   . Depression   . HTN (hypertension)    resolved with weight loss s/p gastric bypass  . Hypothyroidism    Since at least 2007  . Osteoporosis   . RLS (restless legs  syndrome)   . Sleep apnea   . Stroke [facility])    2013/February 2017    Past Surgical History:  Procedure Laterality Date  . BREAST BIOPSY Right    benign  . CATARACT EXTRACTION  2018  . CHOLECYSTECTOMY    . COLONOSCOPY  2004   internal hemorrhoids, left-sided diverticulae, splenic flexure polyp (6mm), path unavailable at this time  . COLONOSCOPY  04/2011   RMR: Internal and external hemorrhoids, rectal tubular adenoma  removed, left-sided diverticulosis  . COLONOSCOPY N/A 06/01/2016   Procedure: COLONOSCOPY;  Surgeon: Corbin Ade, MD;  Location: [facility];  Service: Endoscopy;  Laterality: N/A;  0830  . ESOPHAGOGASTRODUODENOSCOPY  2004   small hiatal hernia  . GASTRIC ROUX-EN-Y  2006     Current Outpatient Medications on File Prior to Visit  Medication Sig Dispense Refill  . ALPRAZolam (XANAX) 0.5 MG tablet Take 0.5 mg by mouth daily as needed. For anxiety and/or sleep    . amLODipine (NORVASC) 2.5 MG tablet     . Ascorbic Acid (VITAMIN C) 1000 MG tablet Take 1,000 mg by mouth daily.    Marland Kitchen aspirin EC 81 MG tablet Take 81 mg by mouth daily.    Marland Kitchen atorvastatin (LIPITOR) 10 MG tablet Take 1 tablet (10 mg total) by mouth daily. 30 tablet 1  . b complex vitamins tablet Take 1 tablet by mouth daily.    . busPIRone (BUSPAR) 10 MG tablet Take 10 mg by mouth 2 (two) times daily.    . Cholecalciferol (VITAMIN D-3) 1000 UNITS CAPS Take 1,000 Units by mouth daily.    . DULoxetine (CYMBALTA) 60 MG capsule Take 60 mg by mouth daily.    Marland Kitchen losartan (COZAAR) 100 MG tablet TAKE 1 TABLET BY MOUTH EVERY DAY 30 tablet 2  . Multiple Vitamins-Minerals (ICAPS AREDS 2 PO) Take 2 capsules by mouth daily.    Marland Kitchen rOPINIRole (REQUIP) 0.25 MG tablet Take 1 tablet (0.25 mg total) by mouth at bedtime. 90 tablet 3  . SYNTHROID 175 MCG tablet Take 175 mcg by mouth daily.  3   No current facility-administered medications on file prior to visit.    Allergies  Allergen Reactions  . Amoxicillin-Pot Clavulanate  Hives and Rash    Has patient had a PCN reaction causing immediate rash, facial/tongue/throat swelling, SOB or lightheadedness with hypotension:no Has patient had a PCN reaction causing severe rash involving mucus membranes or skin necrosis:no      Physical exam:  Today's Vitals   12/28/20 1424  BP: (!) 155/76  Pulse: 84  Weight: 194 lb (88 kg)  Height: 5\' 3"  (1.6 m)   Body mass index is 34.37 kg/m.   Wt Readings from Last 3 Encounters:  12/28/20 194 lb (88 kg)  09/03/18 198 lb (89.8 kg)  07/23/18 206 lb (93.4 kg)     Ht Readings from Last 3 Encounters:  12/28/20 5\' 3"  (1.6 m)  09/03/18 5\' 3"  (1.6 m)  07/23/18 5\' 4"  (1.626 m)      General: The patient is awake, alert and appears not in acute distress. The patient is well groomed. Head: Normocephalic, atraumatic  Neck is supple. Mallampati 1 , short and steep angle but not a small airway neck circumference:14.3/4 inches . Nasal airflow patent.  Cardiovascular:  Regular rate and cardiac rhythm by pulse,  without distended neck veins. Respiratory: Lungs are clear to auscultation.  Skin:  Without evidence of ankle edema, or rash. Trunk: [name] posture is erect.   Neurologic exam : The patient is awake and alert, oriented to place and time.   Memory subjective described as intact.  Attention span & concentration ability appears normal.  Speech is fluent,  without  dysarthria, dysphonia or aphasia.  Mood and affect are appropriate.   Cranial nerves: no loss of smell or taste reported  Pupils are equal and briskly reactive to light. Funduscopic exam deferred.   Extraocular movements in vertical and horizontal planes were intact and without  nystagmus. No Diplopia. Visual fields by finger perimetry are intact. Hearing was intact to soft voice and finger rubbing.    Facial sensation intact to fine touch.  Facial motor strength is symmetric and tongue and uvula move midline.  Neck ROM : rotation, tilt and flexion  extension were normal for age and shoulder shrug was symmetrical.    Motor exam:  Symmetric bulk, tone and ROM.   Normal tone without cog wheeling, and with symmetric grip strength .   Sensory: Proprioception tested in the upper extremities was normal.   Coordination: Rapid alternating movements in the fingers/hands were of normal speed.  The Finger-to-nose maneuver was intact .   Gait and station: Patient could rise unassisted from a seated position, walked without assistive device.  Stance is of normal width/ base and the patient turned with 3 steps.  Toe and heel walk were deferred.  Deep tendon reflexes: in the  upper and lower extremities are symmetric and intact.  Babinski response was deferred .        After spending a total time of 35  minutes face to face and additional time for physical and neurologic examination, review of laboratory studies,  personal review of imaging studies, reports and results of other testing and review of referral information / records as far as provided in visit, I have established the following assessments:  1) current BMI is 34- loud snoring is still present, the HST 2019 recealed a similar AHI as was noted the in lab test 2013.  2) high degree of fatigue- 54 out of 63 points.  3) patient believes she can tolerate CPAP now better than 8 years ago. Still wondering about INSPIRE alternative option.    My Plan is to proceed with:  1) attended sleep study preferred to SPLIT if possible. Please avoid traditional FFM ! Consider dreamwear or F 30 I if FFM is needed.   2) depending on tolerance we may send for [facility] evaluation with ENT after study.  3)   I would like to thank Adella Hare, PA-C , [place] , [place] for allowing me to meet with and to take care of this pleasant patient.   In short, AAYRA TRACH is presenting with continuously snoring and moderate sleep apnea last noted in 2019, now wanting to see about alternatives to OSA  treatment by CPAP.  I plan to follow up either personally or through our NP within 3-56month.   CC: I will share my notes with [place], [place] , [place].   Electronically signed by: Melvyn Novas, MD 12/28/2020 3:04 PM  [facility] Board certified by The [employer] of Sleep Medicine and Diplomate of the [employer] of Sleep Medicine. Board certified In Neurology through the ABPN, Fellow of the [employer] of Neurology. Medical Director of [facility].

## 2020-12-29 ENCOUNTER — Encounter: Payer: Self-pay | Admitting: Neurology

## 2020-12-29 NOTE — Patient Instructions (Signed)

## 2021-01-14 ENCOUNTER — Other Ambulatory Visit: Payer: Self-pay

## 2021-01-14 ENCOUNTER — Encounter (INDEPENDENT_AMBULATORY_CARE_PROVIDER_SITE_OTHER): Payer: Medicare Other | Admitting: Ophthalmology

## 2021-01-14 DIAGNOSIS — H353111 Nonexudative age-related macular degeneration, right eye, early dry stage: Secondary | ICD-10-CM | POA: Diagnosis not present

## 2021-01-14 DIAGNOSIS — H353122 Nonexudative age-related macular degeneration, left eye, intermediate dry stage: Secondary | ICD-10-CM | POA: Diagnosis not present

## 2021-01-14 DIAGNOSIS — I10 Essential (primary) hypertension: Secondary | ICD-10-CM

## 2021-01-14 DIAGNOSIS — H43813 Vitreous degeneration, bilateral: Secondary | ICD-10-CM

## 2021-01-14 DIAGNOSIS — H35033 Hypertensive retinopathy, bilateral: Secondary | ICD-10-CM | POA: Diagnosis not present

## 2021-01-14 DIAGNOSIS — H35342 Macular cyst, hole, or pseudohole, left eye: Secondary | ICD-10-CM

## 2021-01-21 ENCOUNTER — Other Ambulatory Visit: Payer: Self-pay

## 2021-01-21 ENCOUNTER — Emergency Department (HOSPITAL_COMMUNITY): Payer: Medicare Other

## 2021-01-21 ENCOUNTER — Encounter (HOSPITAL_COMMUNITY): Payer: Self-pay

## 2021-01-21 ENCOUNTER — Emergency Department (HOSPITAL_COMMUNITY)
Admission: EM | Admit: 2021-01-21 | Discharge: 2021-01-21 | Disposition: A | Discharge status: Home / Self Care | Payer: Medicare Other | Attending: Emergency Medicine | Admitting: Emergency Medicine

## 2021-01-21 DIAGNOSIS — M542 Cervicalgia: Secondary | ICD-10-CM | POA: Insufficient documentation

## 2021-01-21 DIAGNOSIS — Z23 Encounter for immunization: Secondary | ICD-10-CM | POA: Diagnosis not present

## 2021-01-21 DIAGNOSIS — J984 Other disorders of lung: Secondary | ICD-10-CM | POA: Diagnosis not present

## 2021-01-21 DIAGNOSIS — I10 Essential (primary) hypertension: Secondary | ICD-10-CM | POA: Insufficient documentation

## 2021-01-21 DIAGNOSIS — Z7982 Long term (current) use of aspirin: Secondary | ICD-10-CM | POA: Diagnosis not present

## 2021-01-21 DIAGNOSIS — Z87891 Personal history of nicotine dependence: Secondary | ICD-10-CM | POA: Insufficient documentation

## 2021-01-21 DIAGNOSIS — M47812 Spondylosis without myelopathy or radiculopathy, cervical region: Secondary | ICD-10-CM

## 2021-01-21 DIAGNOSIS — E039 Hypothyroidism, unspecified: Secondary | ICD-10-CM | POA: Insufficient documentation

## 2021-01-21 DIAGNOSIS — Z8673 Personal history of transient ischemic attack (TIA), and cerebral infarction without residual deficits: Secondary | ICD-10-CM | POA: Diagnosis not present

## 2021-01-21 DIAGNOSIS — Z79899 Other long term (current) drug therapy: Secondary | ICD-10-CM | POA: Insufficient documentation

## 2021-01-21 MED ORDER — LIDOCAINE HCL (PF) 1 % IJ SOLN: 30.0000 mL | Freq: Once | INTRAMUSCULAR | Status: DC

## 2021-01-21 MED ORDER — CELECOXIB 100 MG PO CAPS: 100.0000 mg | ORAL_CAPSULE | Freq: Two times a day (BID) | ORAL | 0 refills | Status: DC

## 2021-01-21 MED ORDER — ONDANSETRON 4 MG PO TBDP
4.0000 mg | ORAL_TABLET | Freq: Once | ORAL | Status: AC
  Administered 2021-01-21: 4 mg via ORAL
  Filled 2021-01-21: qty 1

## 2021-01-21 MED ORDER — HYDROCODONE-ACETAMINOPHEN 5-325 MG PO TABS: 1.0000 | ORAL_TABLET | Freq: Four times a day (QID) | ORAL | 0 refills | Status: DC | PRN

## 2021-01-21 MED ORDER — HYDROCODONE-ACETAMINOPHEN 5-325 MG PO TABS
1.0000 | ORAL_TABLET | Freq: Once | ORAL | Status: AC
  Administered 2021-01-21: 1 via ORAL
  Filled 2021-01-21: qty 1

## 2021-01-21 NOTE — ED Notes (Signed)
Entered room and introduced self to patient. Pt appears to be resting in bed, respirations are even and unlabored with equal chest rise and fall. Bed is locked in the lowest position, side rails x2, call bell within reach. Pt educated on call light use and hourly rounding, verbalized understanding and in agreement at this time. All questions and concerns voiced addressed. Refreshments offered and provided per patient request.  

## 2021-01-21 NOTE — ED Triage Notes (Signed)
Pt reports pain in neck radiating to base of her head.  Reports today pain is starting to spread across her shoulder blades.  Pt says feels like tension and says it feels very tight.  Has taken xanax and flexeril  Without relief.  Reports hurts to move her head or raise her arms.  Has also been applying heat without relief.

## 2021-01-21 NOTE — ED Notes (Signed)
Patient transported to CT 

## 2021-01-21 NOTE — ED Notes (Signed)
Pt provided with a heat pack for neck pain at this time.

## 2021-01-21 NOTE — ED Notes (Addendum)
Staff to bedside for EKG at this time, this RN educated by NT that patient has refused EKG reporting "That doesn't have anything to do with my neck. I want someone to give me something for pain."

## 2021-01-21 NOTE — ED Notes (Signed)
Entered room to do ekg pt. Stated that has nothing to do her neck hurting and it was a waste of money pt. Asked was we going do it anyway I stated yes pt. then proceeded to yell she did not want it she just wanted something for pain.

## 2021-01-21 NOTE — Discharge Instructions (Signed)
Your CT scan showed moderate arthritis and some mild disk bulging. You will need to follow up with your primary care doctor as soon as possible for further management.  Contact a health care provider if: You have a fever. Your symptoms do not improve or they get worse. Get help right away if: You have trouble breathing. You make loud, high-pitched sounds when you breathe, most often when you breathe in (stridor). You start to drool. You have trouble swallowing or pain when swallowing. You develop numbness or weakness in your hands or feet. You have changes in your speech, understanding, or vision. You are in severe pain. You cannot move your head or neck. You were given narcotic and or sedative medications while in the emergency department. Do not drive. Do not use machinery or power tools. Do not sign legal documents. Do not drink alcohol. Do not take sleeping pills. Do not supervise children by yourself. Do not participate in activities that require climbing or being in high places.

## 2021-01-21 NOTE — ED Provider Notes (Signed)
[facility] EMERGENCY DEPARTMENT Provider Note   CSN: [id number] Arrival date & time: 01/21/21  1035     History Chief Complaint  Patient presents with  . Neck Pain    Virginia [name] is a 75 y.o. female.  Who presents emergency department chief complaint of posterior neck pain.  She has had 2 days of severe pain in her neck.  She states that the pain is tight, worse with any movement of the neck.  She feels it primarily at the base of her skull and suboccipital region, worse on the right side however she feels severe pain and tightness radiating down to the back of her neck and across her shoulder blades.  She has had some muscle tension in the past but states that she is never had anything like this.  She denies any injuries.  She has tried a TENS unit, Xanax, Flexeril, Motrin, Tylenol without any relief of her symptoms.  She states that her pain was so severe today she felt like she had to go get some help for it.  She denies any upper extremity weakness or numbness.  HPI     Past Medical History:  Diagnosis Date  . Anxiety   . Depression   . HTN (hypertension)    resolved with weight loss s/p gastric bypass  . Hypothyroidism    Since at least 2007  . Osteoporosis   . RLS (restless legs syndrome)   . Sleep apnea   . Stroke [facility])    2013/February 2017    Patient Active Problem List   Diagnosis Date Noted  . RLS (restless legs syndrome) 01/22/2018  . History of colonic polyps   . Constipation 05/11/2016  . Gait difficulty 01/04/2016  . Aphasia 01/04/2016  . Acute CVA (cerebrovascular accident) ([place]) 01/04/2016  . CVA (cerebral infarction) 01/04/2016  . Ataxia   . OSA on CPAP 12/02/2013  . CTS (carpal tunnel syndrome) 03/18/2013  . Wrist fracture 02/04/2013  . Distal radius fracture 12/26/2012  . Hyperlipidemia 06/30/2012  . Elevated blood pressure reading without diagnosis of hypertension 06/29/2012  . Stroke ([place]) 06/28/2012  . Hypothyroidism 06/28/2012  .  Anxiety 06/28/2012  . Personal history of colonic polyps 04/26/2011  . Family history of colonic polyps 04/26/2011  . ARTHRITIS, LEFT KNEE 06/06/2010  . DERANGEMENT OF POSTERIOR HORN OF MEDIAL MENISCUS 06/06/2010  . ANEMIA, B12 DEFICIENCY 02/07/2010  . TRIGGER FINGER, THUMB 02/07/2010  . SHOULDER PAIN 10/14/2008  . BURSITIS, SHOULDER 10/14/2008  . TRIGGER FINGER 06/15/2008    Past Surgical History:  Procedure Laterality Date  . BREAST BIOPSY Right    benign  . CATARACT EXTRACTION  2018  . CHOLECYSTECTOMY    . COLONOSCOPY  2004   internal hemorrhoids, left-sided diverticulae, splenic flexure polyp (57mm), path unavailable at this time  . COLONOSCOPY  04/2011   RMR: Internal and external hemorrhoids, rectal tubular adenoma removed, left-sided diverticulosis  . COLONOSCOPY N/A 06/01/2016   Procedure: COLONOSCOPY;  Surgeon: Daneil Dolin, MD;  Location: [facility]O [facility];  Service: Endoscopy;  Laterality: N/A;  0830  . ESOPHAGOGASTRODUODENOSCOPY  2004   small hiatal hernia  . GASTRIC ROUX-EN-Y  2006     OB History   No obstetric history on file.     Family History  Problem Relation Age of Onset  . Lung cancer Father   . Stroke Father   . Stroke Mother   . Stroke Brother   . Hypertension Brother   . Stroke Maternal Grandmother   .  Stroke Maternal Grandfather   . Cancer Other        family history   . Coronary artery disease Other        family history   . Arthritis Other        family history   . Cancer Daughter     Social History   Tobacco Use  . Smoking status: Former Smoker    Quit date: 11/28/1987    Years since quitting: 33.1  . Smokeless tobacco: Never Used  . Tobacco comment: Quit x 25 plus years  Vaping Use  . Vaping Use: Never used  Substance Use Topics  . Alcohol use: No    Alcohol/week: 0.0 standard drinks  . Drug use: No    Home Medications Prior to Admission medications   Medication Sig Start Date End Date Taking? Authorizing Provider  ALPRAZolam  Duanne Moron) 0.5 MG tablet Take 0.5 mg by mouth daily as needed for anxiety or sleep. 03/27/11  Yes [provider]  amLODipine (NORVASC) 2.5 MG tablet Take 2.5 mg by mouth daily.   Yes [provider]  Ascorbic Acid (VITAMIN C) 1000 MG tablet Take 1,000 mg by mouth daily.   Yes [provider]  aspirin EC 81 MG tablet Take 81 mg by mouth daily.   Yes [provider]  atorvastatin (LIPITOR) 10 MG tablet Take 1 tablet (10 mg total) by mouth daily. 01/22/18  Yes Dohmeier, Asencion Partridge, MD  b complex vitamins tablet Take 1 tablet by mouth daily.   Yes [provider]  busPIRone (BUSPAR) 10 MG tablet Take 10 mg by mouth 2 (two) times daily. 12/11/20  Yes [provider]  Chlorpheniramine Maleate (ALLERGY PO) Take 1 tablet by mouth as needed (post nasal drip).   Yes [provider]  Cholecalciferol (VITAMIN D-3) 1000 UNITS CAPS Take 1,000 Units by mouth daily.   Yes [provider]  DULoxetine (CYMBALTA) 60 MG capsule Take 60 mg by mouth daily.   Yes [provider]  losartan (COZAAR) 100 MG tablet TAKE 1 TABLET BY MOUTH EVERY DAY Patient taking differently: Take 100 mg by mouth daily. 08/05/19  Yes Herminio Commons, MD  Multiple Vitamins-Minerals (ICAPS AREDS 2 PO) Take 2 capsules by mouth daily.   Yes [provider]  rOPINIRole (REQUIP) 0.25 MG tablet Take 1 tablet (0.25 mg total) by mouth at bedtime. 01/22/18  Yes Dohmeier, Asencion Partridge, MD  SYNTHROID 175 MCG tablet Take 175 mcg by mouth daily. 08/05/18  Yes [provider]  CYCLOBENZAPRINE HCL PO Take 1 tablet by mouth daily as needed (muscle spasm).    [provider]    Allergies    Amoxicillin-pot clavulanate  Review of Systems   Review of Systems  Constitutional: Negative for fever.  Eyes: Negative for photophobia and visual disturbance.  Gastrointestinal: Negative for vomiting.  Musculoskeletal: Positive for neck pain and neck stiffness.  Skin:  Negative for rash.  Neurological: Negative for weakness, numbness and headaches.    Physical Exam Updated Vital Signs BP (!) 178/87 (BP Location: Right Arm)   Pulse 77   Temp 98.4 F (36.9 C) (Oral)   Resp 19   Ht 5\' 4"  (1.626 m)   Wt 88.5 kg   SpO2 100%   BMI 33.47 kg/m   Physical Exam Vitals and nursing note reviewed.  Constitutional:      General: She is not in acute distress.    Appearance: She is well-developed and well-nourished. She is not diaphoretic.  HENT:  Head: Normocephalic and atraumatic.  Eyes:     General: No scleral icterus.    Conjunctiva/sclera: Conjunctivae normal.  Cardiovascular:     Rate and Rhythm: Normal rate and regular rhythm.     Heart sounds: Normal heart sounds. No murmur heard. No friction rub. No gallop.   Pulmonary:     Effort: Pulmonary effort is normal. No respiratory distress.     Breath sounds: Normal breath sounds.  Abdominal:     General: Bowel sounds are normal. There is no distension.     Palpations: Abdomen is soft. There is no mass.     Tenderness: There is no abdominal tenderness. There is no guarding.  Musculoskeletal:     Cervical back: Normal range of motion.     Comments: Patient sitting with neck and rigid upright per his position.  She feels stiff with forward flexion but has pain with extension of the neck.  She is able to turn her head to the left about 20 to 30 degrees.  She has severe pain with movement toward the right past midline and is unable to proceed past about 5 degrees of motion.  She has some significant stiffness in the trapezius muscle and suboccipital region however pain is not reproducible with palpation of the musculature. Bilateral upper extremities with equal grip strength and normal sensation.  Skin:    General: Skin is warm and dry.  Neurological:     Mental Status: She is alert and oriented to person, place, and time.  Psychiatric:        Behavior: Behavior normal.     ED Results /  Procedures / Treatments   Labs (all labs ordered are listed, but only abnormal results are displayed) Labs Reviewed - No data to display  EKG None  Radiology No results found.  Procedures Procedures   Medications Ordered in ED Medications  lidocaine (PF) (XYLOCAINE) 1 % injection 30 mL (has no administration in time range)  HYDROcodone-acetaminophen (NORCO/VICODIN) 5-325 MG per tablet 1 tablet (has no administration in time range)  ondansetron (ZOFRAN-ODT) disintegrating tablet 4 mg (has no administration in time range)    ED Course  I have reviewed the triage vital signs and the nursing notes.  Pertinent labs & imaging results that were available during my care of the patient were reviewed by me and considered in my medical decision making (see chart for details).    MDM Rules/Calculators/A&P                          75 year old female here with neck pain.  No evidence of meningitis, no neurologic deficits or meningismus.  I ordered and reviewed a CT scan which showed some chronic arthritic changes.  The patient was given pain medications with significant improvement in her pain. PDMP reviewed during this encounter. Patient be discharged with pain medication, follow-up with PCP.  Discussed return precautions. Final Clinical Impression(s) / ED Diagnoses Final diagnoses:  None    Rx / DC Orders ED Discharge Orders    None       Margarita Mail, PA-C 01/21/21 2249    Milton Ferguson, MD 01/24/21 [phone]

## 2021-01-24 ENCOUNTER — Telehealth: Payer: Self-pay | Admitting: Neurology

## 2021-01-24 NOTE — Telephone Encounter (Signed)
Pt. states that she's been in the ER & needs to make an appt. with a neurosurgeon. She asks for a referral. Please advise.

## 2021-01-24 NOTE — Telephone Encounter (Signed)
Called the patient back.  Advised that the referral would need to come from her primary care physician.  Patient was appreciative for the call back.  Gave some recommendations with [place] [facility].  Patient verbalized understanding and had no further concerns

## 2021-02-11 ENCOUNTER — Other Ambulatory Visit (HOSPITAL_COMMUNITY): Payer: Self-pay | Admitting: Physician Assistant

## 2021-02-11 DIAGNOSIS — Z1231 Encounter for screening mammogram for malignant neoplasm of breast: Secondary | ICD-10-CM

## 2021-02-13 ENCOUNTER — Ambulatory Visit (INDEPENDENT_AMBULATORY_CARE_PROVIDER_SITE_OTHER): Payer: Medicare Other | Admitting: Neurology

## 2021-02-13 DIAGNOSIS — I639 Cerebral infarction, unspecified: Secondary | ICD-10-CM

## 2021-02-13 DIAGNOSIS — G4733 Obstructive sleep apnea (adult) (pediatric): Secondary | ICD-10-CM | POA: Diagnosis not present

## 2021-02-13 DIAGNOSIS — G4761 Periodic limb movement disorder: Secondary | ICD-10-CM

## 2021-02-13 DIAGNOSIS — Z789 Other specified health status: Secondary | ICD-10-CM

## 2021-02-13 DIAGNOSIS — Z9884 Bariatric surgery status: Secondary | ICD-10-CM

## 2021-02-17 ENCOUNTER — Telehealth: Payer: Self-pay | Admitting: Neurology

## 2021-02-17 DIAGNOSIS — Z9884 Bariatric surgery status: Secondary | ICD-10-CM | POA: Insufficient documentation

## 2021-02-17 DIAGNOSIS — Z789 Other specified health status: Secondary | ICD-10-CM | POA: Insufficient documentation

## 2021-02-17 DIAGNOSIS — G4733 Obstructive sleep apnea (adult) (pediatric): Secondary | ICD-10-CM | POA: Insufficient documentation

## 2021-02-17 DIAGNOSIS — G4761 Periodic limb movement disorder: Secondary | ICD-10-CM | POA: Insufficient documentation

## 2021-02-17 MED ORDER — ROPINIROLE HCL 0.5 MG PO TABS: ORAL_TABLET | ORAL | 2 refills | Status: DC

## 2021-02-17 NOTE — Progress Notes (Signed)
IMPRESSION:  1. There is mild Obstructive Sleep Apnea (OSA) present and moderate degree of UARS / snoring related arousals.  2. There was sleep hypoxemia recorded, with a nadir of 82% 02 sat.  3. Severe Periodic Limb Movement Disorder (PLMD) was present but appeared reduced in REM sleep. 4. Intermittent Snoring Very reduced REM sleep was noted.    RECOMMENDATIONS:  1. Advise full night, attended, CPAP titration study to optimize therapy for OSA and UARS and hypoxemia. This apnea is strongly REM dependent and associated with hypoxemia, therefor needing PAP therapy.  2.  The patient can reduce apnea probably by losing weight, sleeping on her side and may reduce snoring with a dental device.  3. The PLMs respond sometimes to apnea control, but in this case are so severe that they constitute a sleep disorder on its own. Requip has been prescribed, Xanax is taken.  4. There were less PLMs in lateral sleep and in REM sleep. check or malabsorption/ neuropathy as causes for RLS.  ( this will usually be done by PCP, however I will increase requip dose before returning to sleep lab)

## 2021-02-17 NOTE — Addendum Note (Signed)
Addended by: Larey Seat on: 02/17/2021 04:29 PM   Modules accepted: Orders

## 2021-02-17 NOTE — Telephone Encounter (Signed)
I called pt. I advised pt that Dr. Brett Fairy reviewed their sleep study results and found that pt has mild to moderate sleep apnea, hypoxia and increased in PLM's. Dr Dohmeier recommends that pt be treated with a cpap. Dr. Brett Fairy recommends that pt return for a repeat sleep study in order to properly titrate the cpap and ensure a good mask fit. Pt is agreeable to returning for a titration study. I advised pt that our sleep lab will file with pt's insurance and call pt to schedule the sleep study when we hear back from the pt's insurance regarding coverage of this sleep study. Pt verbalized understanding of results. Pt had no questions at this time but was encouraged to call back if questions arise.

## 2021-02-17 NOTE — Telephone Encounter (Signed)
-----   Message from Larey Seat, MD sent at 02/17/2021  4:28 PM EDT ----- IMPRESSION:  1. There is mild Obstructive Sleep Apnea (OSA) present and moderate degree of UARS / snoring related arousals.  2. There was sleep hypoxemia recorded, with a nadir of 82% 02 sat.  3. Severe Periodic Limb Movement Disorder (PLMD) was present but appeared reduced in REM sleep. 4. Intermittent Snoring Very reduced REM sleep was noted.    RECOMMENDATIONS:  1. Advise full night, attended, CPAP titration study to optimize therapy for OSA and UARS and hypoxemia. This apnea is strongly REM dependent and associated with hypoxemia, therefor needing PAP therapy.  2.  The patient can reduce apnea probably by losing weight, sleeping on her side and may reduce snoring with a dental device.  3. The PLMs respond sometimes to apnea control, but in this case are so severe that they constitute a sleep disorder on its own. Requip has been prescribed, Xanax is taken.  4. There were less PLMs in lateral sleep and in REM sleep. check or malabsorption/ neuropathy as causes for RLS.  ( this will usually be done by PCP, however I will increase requip dose before returning to sleep lab)

## 2021-02-17 NOTE — Progress Notes (Signed)
I increased ropinorol to 0.5 mg in PM and prn PO at night

## 2021-02-17 NOTE — Procedures (Signed)
PATIENT'S NAME:  Virginia [name], Virginia [name] DOB:      [birth year]      MR#:    [id number]    DATE OF RECORDING: 02/13/2021 REFERRING M.D.:  Waylan Rocher, [place] Study Performed:   Baseline Polysomnogram HISTORY:  Virginia [name] 12-29-2018, a right-handed Caucasian female with a History of OSA sleep disorder. The patient had her first sleep study with [place] in the year 2013 -but had previous sleep studies dated back to 2006.She was referred by Dr Floyde Parkins, MD at [facility] at the time.  The 2013 study ended with a result of an AHI (Apnea Hypopnea index) of 17/h, no hypoxemia was noted and frequent PLMs were present.  Under CPAP, the PLMs resolved, but central apnea emerged. The best tolerated pressure was only 6 cm water.     She has a past medical history of Anxiety, Depression, HTN (hypertension), Hypothyroidism, Osteoporosis, RLS (restless legs syndrome), Sleep apnea, and CVA-Stroke ([place]). She is a post-gastric bypass patient, she lost a lot of weight and had discontinued the therapy. She was treated last year for anemia and thyroiditis, partially attributed to mal absorption. Also hearing loss and macular edema.  Sleep relevant medical history: Restless leg syndrome, obstructive sleep apnea currently untreated, generalized anxiety disorder, moderate depression, stroke ischemic, and impaired glucose tolerance.  Her new primary care physician has provided alprazolam 0.5 mg when necessary for severe anxiety but also voiced concern about the untreated obstructive sleep apnea. The patient reports severe fatigue. Her hypertension and glucose level seems to be well controlled currently.  She did undergo a gastric bypass surgery and lost 100 pounds the symptoms seem to resolve with weight loss and therefore she stopped using the CPAP therapy.   Another outside home sleep test was performed on 5-15 -2019 and had been ordered by [employer], MD, and revealed an AHI of 16.8 very close to the baseline of 2013.  She  had no significant central apnea proportion.  Obstructive events, she did not have significant hypoxemia. The RDI was 46.7 indicating loud snoring.  The study was performed through [facility] [email or web address].     The patient endorsed the Epworth Sleepiness Scale at 11 points.  FSS at 54/63 points.  [name] weight 194 pounds with a height of 63 (inches), resulting in a BMI of 34.4 kg/m2. [name] neck circumference measured 14 inches.  CURRENT MEDICATIONS: Xanax, Norvasc, Vitamin C, Aspirin, Lipitor, B Complex Vitamins, Buspar, Vitamin D-3, Cymbalta, Synthroid, Cozaar, Requip, Multiple Vitamins-Minerals   PROCEDURE:  This is a multichannel digital polysomnogram utilizing the Somnostar 11.2 system.  Electrodes and sensors were applied and monitored per AASM Specifications.   EEG, EOG, Chin and Limb EMG, were sampled at 200 Hz.  ECG, Snore and Nasal Pressure, Thermal Airflow, Respiratory Effort, CPAP Flow and Pressure, Oximetry was sampled at 50 Hz. Digital video and audio were recorded.      BASELINE STUDY: Lights Out was at 21:02 and Lights On at 05:33.  Total recording time (TRT) was 510.5 minutes, with a total sleep time (TST) of 425.5 minutes.   [name] sleep latency was 32 minutes.  REM latency was 428.5 minutes.  The sleep efficiency was 83.3 %.     SLEEP ARCHITECTURE: WASO (Wake after sleep onset) was 53 minutes.  There were 150.5 minutes in Stage N1, 267.5 minutes Stage N2, 0 minutes Stage N3 and 7.5 minutes in Stage REM.  The percentage of Stage N1 was 35.4%, Stage N2 was 62.9%, Stage N3  was 0% and Stage R (REM sleep) was 1.8%.   RESPIRATORY ANALYSIS:  There were a total of 100 respiratory events:  4 obstructive apneas, 0 central apneas and 0 mixed apneas with 96 hypopneas.  There were lots of RERAS.   The total APNEA/HYPOPNEA INDEX (AHI) was 14.1/hour and the total RESPIRATORY DISTURBANCE INDEX was  18.1 /hour.  7 events occurred in REM sleep and 178 events in NREM.  The REM AHI was  56 /hour, versus a non-REM AHI of 13.3. The patient spent 229.5 minutes of total sleep time in the supine position and 196 minutes in non-supine. The supine AHI was 25.1/h versus a non-supine AHI of 1.2.  OXYGEN SATURATION & C02:  The Wake baseline 02 saturation was 97%, with the lowest being 82%. Time spent below 89% saturation equaled 21 minutes. The arousals were noted as: 57 were spontaneous, 42 were associated with PLMs, 57 were associated with respiratory events. The patient had a total of 546 Periodic Limb Movements. The Periodic Limb Movement (PLM) Arousal index was 5.9/hour. Audio and video analysis did not show any abnormal or unusual movements, behaviors, phonations or vocalizations.  Snoring was noted. EKG was in keeping with normal sinus rhythm (NSR).  IMPRESSION:  1. There is mild Obstructive Sleep Apnea (OSA) present and moderate degree of UARS / snoring related arousals.  2. There was sleep hypoxemia recorded, with a nadir of 82% 02 sat.  3. Severe Periodic Limb Movement Disorder (PLMD) was present but appeared reduced in REM sleep. 4. Intermittent Snoring Very reduced REM sleep was noted.    RECOMMENDATIONS:  1. Advise full night, attended, CPAP titration study to optimize therapy for OSA and UARS and hypoxemia. This apnea is strongly REM dependent and associated with hypoxemia, therefor needing PAP therapy.  2.  The patient can reduce apnea probably by losing weight, sleeping on her side and may reduce snoring with a dental device.  3. The PLMs respond sometimes to apnea control, but in this case are so severe that they constitute a sleep disorder on its own. Requip has been prescribed, Xanax is taken.  4. There were less PLMs in lateral sleep and in REM sleep. check or malabsorption/ neuropathy as causes for RLS.     I certify that I have reviewed the entire raw data recording prior to the issuance of this report in accordance with the Standards of  Accreditation of the American Academy of Sleep Medicine (AASM)      [] Larey Seat, MD Diplomat, American Board of Psychiatry and Neurology  Diplomat, American Board of Sleep Medicine Market researcher, [place] [facility] [employer]

## 2021-02-17 NOTE — Addendum Note (Signed)
Addended by: Larey Seat on: 02/17/2021 04:31 PM   Modules accepted: Orders

## 2021-02-22 ENCOUNTER — Telehealth: Payer: Self-pay

## 2021-02-22 NOTE — Telephone Encounter (Signed)
LVM for pt to call me back to schedule sleep study  

## 2021-03-02 ENCOUNTER — Other Ambulatory Visit: Payer: Self-pay

## 2021-03-02 ENCOUNTER — Ambulatory Visit (INDEPENDENT_AMBULATORY_CARE_PROVIDER_SITE_OTHER): Payer: Medicare Other | Admitting: Orthopedic Surgery

## 2021-03-02 ENCOUNTER — Ambulatory Visit: Payer: Medicare Other

## 2021-03-02 ENCOUNTER — Encounter: Payer: Self-pay | Admitting: Orthopedic Surgery

## 2021-03-02 VITALS — BP 121/73 | HR 83 | Ht 63.0 in | Wt 191.0 lb

## 2021-03-02 DIAGNOSIS — M7582 Other shoulder lesions, left shoulder: Secondary | ICD-10-CM

## 2021-03-02 DIAGNOSIS — M25512 Pain in left shoulder: Secondary | ICD-10-CM

## 2021-03-02 NOTE — Progress Notes (Signed)
New Patient Visit  Assessment: Virginia [name] is a 75 y.o. female with the following: Tendinitis of left rotator cuff  Plan: Patient has left shoulder pain which started approximately 3 weeks ago.  She has severe pain with very little motion.  On physical exam, she has limited active motion and does not tolerate passive range of motion.  She has had injections in the past for other issues, with good success.  She is interested in an injection today.  Home exercise program provided.  Follow up as needed.  Depending on the success of the injection, we may consider an MRI for further evaluation.  She will have to wait 3 months to repeat an injection.    Procedure note injection Left shoulder    Verbal consent was obtained to inject the left shoulder, subacromial space Timeout was completed to confirm the site of injection.  The skin was prepped with alcohol and ethyl chloride was sprayed at the injection site.  A 21-gauge needle was used to inject 6 mg of betamethasone and 1% lidocaine (3 cc) into the subacromial space of the left shoulder using a posterolateral approach.  There were no complications. A sterile bandage was applied.   Follow-up: Return if symptoms worsen or fail to improve.  Subjective:  Chief Complaint  Patient presents with  . Shoulder Injury    Left shoulder pain, Patient reports she has had pain for about 22-23 days.     History of Present Illness: Virginia [name] is a 75 y.o. RHD female who presents for evaluation of left shoulder pain.  Approximately 3 weeks ago, she was working in the yard.  Immediately following that, she had significant pain.  She denies a specific injury, but notes that the pain became very severe.  Since then, she has had pain in the anterior and lateral aspect of her left shoulder.  She has limited range of motion.  She has been taking Tylenol and ibuprofen as needed, with limited improvement.  Prior to this episode, she has never had an  issue with her left shoulder.  No injuries in the past.  She has had no episodes of pain in the past.  She has had injections for trigger thumb previously, with good success, and is interested in an injection for her left shoulder.  The pain is worse at night.  She also notes some pain radiating distally into her biceps.   Review of Systems: No fevers or chills No numbness or tingling No chest pain No shortness of breath No bowel or bladder dysfunction No GI distress No headaches   Medical History:  Past Medical History:  Diagnosis Date  . Anxiety   . Depression   . HTN (hypertension)    resolved with weight loss s/p gastric bypass  . Hypothyroidism    Since at least 2007  . Osteoporosis   . RLS (restless legs syndrome)   . Sleep apnea   . Stroke [facility])    2013/February 2017    Past Surgical History:  Procedure Laterality Date  . BREAST BIOPSY Right    benign  . CATARACT EXTRACTION  2018  . CHOLECYSTECTOMY    . COLONOSCOPY  2004   internal hemorrhoids, left-sided diverticulae, splenic flexure polyp (8mm), path unavailable at this time  . COLONOSCOPY  04/2011   RMR: Internal and external hemorrhoids, rectal tubular adenoma removed, left-sided diverticulosis  . COLONOSCOPY N/A 06/01/2016   Procedure: COLONOSCOPY;  Surgeon: Daneil Dolin, MD;  Location: [facility];  Service: Endoscopy;  Laterality: N/A;  0830  . ESOPHAGOGASTRODUODENOSCOPY  2004   small hiatal hernia  . GASTRIC ROUX-EN-Y  2006    Family History  Problem Relation Age of Onset  . Lung cancer Father   . Stroke Father   . Stroke Mother   . Stroke Brother   . Hypertension Brother   . Stroke Maternal Grandmother   . Stroke Maternal Grandfather   . Cancer Other        family history   . Coronary artery disease Other        family history   . Arthritis Other        family history   . Cancer Daughter    Social History   Tobacco Use  . Smoking status: Former Smoker    Quit date: 11/28/1987     Years since quitting: 33.2  . Smokeless tobacco: Never Used  . Tobacco comment: Quit x 25 plus years  Vaping Use  . Vaping Use: Never used  Substance Use Topics  . Alcohol use: No    Alcohol/week: 0.0 standard drinks  . Drug use: No    Allergies  Allergen Reactions  . Amoxicillin-Pot Clavulanate Hives and Rash    Has patient had a PCN reaction causing immediate rash, facial/tongue/throat swelling, SOB or lightheadedness with hypotension:no Has patient had a PCN reaction causing severe rash involving mucus membranes or skin necrosis:no      Current Meds  Medication Sig  . ALPRAZolam (XANAX) 0.5 MG tablet Take 0.5 mg by mouth daily as needed for anxiety or sleep.  Marland Kitchen amLODipine (NORVASC) 2.5 MG tablet Take 2.5 mg by mouth daily.  . Ascorbic Acid (VITAMIN C) 1000 MG tablet Take 1,000 mg by mouth daily.  Marland Kitchen aspirin EC 81 MG tablet Take 81 mg by mouth daily.  Marland Kitchen atorvastatin (LIPITOR) 10 MG tablet Take 1 tablet (10 mg total) by mouth daily.  Marland Kitchen b complex vitamins tablet Take 1 tablet by mouth daily.  . busPIRone (BUSPAR) 10 MG tablet Take 10 mg by mouth 2 (two) times daily.  . celecoxib (CELEBREX) 100 MG capsule Take 1 capsule (100 mg total) by mouth 2 (two) times daily.  . Chlorpheniramine Maleate (ALLERGY PO) Take 1 tablet by mouth as needed (post nasal drip).  . Cholecalciferol (VITAMIN D-3) 1000 UNITS CAPS Take 1,000 Units by mouth daily.  . CYCLOBENZAPRINE HCL PO Take 1 tablet by mouth daily as needed (muscle spasm).  . DULoxetine (CYMBALTA) 60 MG capsule Take 60 mg by mouth daily.  Marland Kitchen HYDROcodone-acetaminophen (NORCO) 5-325 MG tablet Take 1 tablet by mouth every 6 (six) hours as needed for severe pain.  Marland Kitchen losartan (COZAAR) 100 MG tablet TAKE 1 TABLET BY MOUTH EVERY DAY (Patient taking differently: Take 100 mg by mouth daily.)  . Multiple Vitamins-Minerals (ICAPS AREDS 2 PO) Take 2 capsules by mouth daily.  Marland Kitchen rOPINIRole (REQUIP) 0.5 MG tablet 1 tab in Pm and one at bedtime po  .  SYNTHROID 175 MCG tablet Take 175 mcg by mouth daily.    Objective: BP 121/73   Pulse 83   Ht 5\' 3"  (1.6 m)   Wt 191 lb (86.6 kg)   BMI 33.83 kg/m   Physical Exam:  General:  Alert and oriented, no acute distress Gait: Normal  Evaluation left shoulder demonstrates no deformity.  No atrophy is appreciated.  There is no redness or swelling appreciated.  She has diffuse tenderness to palpation over the posterior lateral corner of the shoulder.  Tenderness in the anterior shoulder, in line with the bicipital groove.  Abduction at her side is limited to 90 degrees.  Forward flexion limited to 40 degrees.  She tolerates very little passive range of motion beyond this.  Internal rotation to her lateral hip.  Negative belly press.  4+/5 strength infraspinatus testing.  She notes pain in her left shoulder with any strength testing.  Provocative maneuvers limited otherwise.    IMAGING: I personally ordered and reviewed the following images   X-rays of the left shoulder were obtained in clinic today and demonstrates a well reduced glenohumeral joint.  There does appear to be osteophytes off the inferior aspect of the glenoid.  However, the glenohumeral joint space is preserved.  There is limited space between the proximal humerus and the acromion.  There does appear to be some calcification within the rotator cuff tendons.  Impression: Normal left shoulder x-ray with limited space between the proximal humerus and the acromion.   New Medications:  No orders of the defined types were placed in this encounter.     Mordecai Rasmussen, MD  03/02/2021 8:49 AM

## 2021-03-02 NOTE — Patient Instructions (Signed)
Instructions Following Joint Injections  In clinic today, you received an injection in one of your joints (sometimes more than one).  Occasionally, you can have some pain at the injection site, this is normal.  You can place ice at the injection site, or take over-the-counter medications such as Tylenol (acetaminophen) or Advil (ibuprofen).  Please follow all directions listed on the bottle.  If your joint (knee or shoulder) becomes swollen, red or very painful, please contact the clinic for additional assistance.   Two medications were injected, including lidocaine and a steroid (often referred to as cortisone).  Lidocaine is effective almost immediately but wears off quickly.  However, the steroid can take a few days to improve your symptoms.  In some cases, it can make your pain worse for a couple of days.  Do not be concerned if this happens as it is common.  You can apply ice or take some over-the-counter medications as needed.        Rotator Cuff Tear/Tendinitis Rehab   Ask your health care provider which exercises are safe for you. Do exercises exactly as told by your health care provider and adjust them as directed. It is normal to feel mild stretching, pulling, tightness, or discomfort as you do these exercises. Stop right away if you feel sudden pain or your pain gets worse. Do not begin these exercises until told by your health care provider. Stretching and range-of-motion exercises  These exercises warm up your muscles and joints and improve the movement and flexibility of your shoulder. These exercises also help to relieve pain.  Shoulder pendulum In this exercise, you let the injured arm dangle toward the floor and then swing it like a clock pendulum. 1. Stand near a table or counter that you can hold onto for balance. 2. Bend forward at the waist and let your left / right arm hang straight down. Use your other arm to support you and help you stay balanced. 3. Relax your left /  right arm and shoulder muscles, and move your hips and your trunk so your left / right arm swings freely. Your arm should swing because of the motion of your body, not because you are using your arm or shoulder muscles. 4. Keep moving your hips and trunk so your arm swings in the following directions, as told by your health care provider: ? Side to side. ? Forward and backward. ? In clockwise and counterclockwise circles. 5. Slowly return to the starting position. Repeat 10 times, or for 10 seconds per direction. Complete this exercise 2-3 times a day.      Shoulder flexion, seated This exercise is sometimes called table slides. In this exercise, you raise your arm in front of your body until you feel a stretch in your injured shoulder. 1. Sit in a stable chair so your left / right forearm can rest on a flat surface. Your elbow should rest at a height that keeps your upper arm next to your body. 2. Keeping your left / right shoulder relaxed, lean forward at the waist and let your hand slide forward (flexion). Stop when you feel a stretch in your shoulder, or when you reach the angle that is recommended by your health care provider. 3. Hold for 5 seconds. 4. Slowly return to the starting position. Repeat 10 times. Complete this exercise 1-2  times a day.       Shoulder flexion, standing In this exercise, you raise your arm in front of your body (flexion)  until you feel a stretch in your injured shoulder. 1. Stand and hold a broomstick, a cane, or a similar object. Place your hands a little more than shoulder-width apart on the object. Your left / right hand should be palm-up, and your other hand should be palm-down. 2. Keep your elbow straight and your shoulder muscles relaxed. Push the stick up with your healthy arm to raise your left / right arm in front of your body, and then over your head until you feel a stretch in your shoulder. ? Avoid shrugging your shoulder while you raise your arm.  Keep your shoulder blade tucked down toward the middle of your back. ? Keep your left / right shoulder muscles relaxed. 3. Hold for 10 seconds. 4. Slowly return to the starting position. Repeat 10 times. Complete this exercise 1-2 times a day.      Shoulder abduction, active-assisted You will need a stick, broom handle, or similar object to help you (assist) in doing this exercise. 1. Lie on your back. This is the supine position. Hold a broomstick, a cane, or a similar object. 2. Place your hands a little more than shoulder-width apart on the object. Your left / right hand should be palm-up, and your other hand should be palm-down. 3. Keeping your shoulder relaxed, push the stick to raise your left / right arm out to your side (abduction) and then over your head. Use your other hand to help move the stick. Stop when you feel a stretch in your shoulder, or when you reach the angle that is recommended by your health care provider. ? Avoid shrugging your shoulder while you raise your arm. Keep your shoulder blade tucked down toward the middle of your back. 4. Hold for 10 seconds. 5. Slowly return to the starting position. Repeat 10 times. Complete this exercise 1-2 times a day.      Shoulder flexion, active-assisted 1. Lie on your back. You may bend your knees for comfort. 2. Hold a broomstick, a cane, or a similar object so that your hands are about shoulder-width apart. Your palms should face toward your feet. 3. Raise your left / right arm over your head, then behind your head toward the floor (flexion). Use your other hand to help you do this (active-assisted). Stop when you feel a gentle stretch in your shoulder, or when you reach the angle that is recommended by your health care provider. 4. Hold for 10 seconds. 5. Use the stick and your other arm to help you return your left / right arm to the starting position. Repeat 10 times. Complete this exercise 1-2 times a day.      External  rotation 1. Sit in a stable chair without armrests, or stand up. 2. Tuck a soft object, such as a folded towel or a small ball, under your left / right upper arm. 3. Hold a broomstick, a cane, or a similar object with your palms face-down, toward the floor. Bend your elbows to a 90-degree angle (right angle), and keep your hands about shoulder-width apart. 4. Straighten your healthy arm and push the stick across your body, toward your left / right side. Keep your left / right arm bent. This will rotate your left / right forearm away from your body (external rotation). 5. Hold for 10 seconds. 6. Slowly return to the starting position. Repeat 10 times. Complete this exercise 1-2 times a day.        Strengthening exercises These exercises build strength and  endurance in your shoulder. Endurance is the ability to use your muscles for a long time, even after they get tired. Do not start doing these exercises until your health care provider approves. Shoulder flexion, isometric 1. Stand or sit in a doorway, facing the door frame. 2. Keep your left / right arm straight and make a gentle fist with your hand. Place your fist against the door frame. Only your fist should be touching the frame. Keep your upper arm at your side. 3. Gently press your fist against the door frame, as if you are trying to raise your arm above your head (isometric shoulder flexion). ? Avoid shrugging your shoulder while you press your hand into the door frame. Keep your shoulder blade tucked down toward the middle of your back. 4. Hold for 10 seconds. 5. Slowly release the tension, and relax your muscles completely before you repeat the exercise. Repeat 10 times. Complete this exercise 3 times per week.      Shoulder abduction, isometric 1. Stand or sit in a doorway. Your left / right arm should be closest to the door frame. 2. Keep your left / right arm straight, and place the back of your hand against the door frame. Only  your hand should be touching the frame. Keep the rest of your arm close to your side. 3. Gently press the back of your hand against the door frame, as if you are trying to raise your arm out to the side (isometric shoulder abduction). ? Avoid shrugging your shoulder while you press your hand into the door frame. Keep your shoulder blade tucked down toward the middle of your back. 4. Hold for 10 seconds. 5. Slowly release the tension, and relax your muscles completely before you repeat the exercise. Repeat 10 times. Complete this exercise 3 times per week.      Internal rotation, isometric This is an exercise in which you press your palm against a door frame without moving your shoulder joint (isometric). 1. Stand or sit in a doorway, facing the door frame. 2. Bend your left / right elbow, and place the palm of your hand against the door frame. Only your palm should be touching the frame. Keep your upper arm at your side. 3. Gently press your hand against the door frame, as if you are trying to push your arm toward your abdomen (internal rotation). Gradually increase the pressure until you are pressing as hard as you can. Stop increasing the pressure if you feel shoulder pain. ? Avoid shrugging your shoulder while you press your hand into the door frame. Keep your shoulder blade tucked down toward the middle of your back. 4. Hold for 10 seconds. 5. Slowly release the tension, and relax your muscles completely before you repeat the exercise. Repeat 10 times. Complete this exercise 3 times per week.      External rotation, isometric This is an exercise in which you press the back of your wrist against a door frame without moving your shoulder joint (isometric). 1. Stand or sit in a doorway, facing the door frame. 2. Bend your left / right elbow and place the back of your wrist against the door frame. Only the back of your wrist should be touching the frame. Keep your upper arm at your  side. 3. Gently press your wrist against the door frame, as if you are trying to push your arm away from your abdomen (external rotation). Gradually increase the pressure until you are pressing as hard  as you can. Stop increasing the pressure if you feel pain. ? Avoid shrugging your shoulder while you press your wrist into the door frame. Keep your shoulder blade tucked down toward the middle of your back. 4. Hold for 10 seconds. 5. Slowly release the tension, and relax your muscles completely before you repeat the exercise. Repeat 10 times. Complete this exercise 3 times per week.       Scapular retraction 1. Sit in a stable chair without armrests, or stand up. 2. Secure an exercise band to a stable object in front of you so the band is at shoulder height. 3. Hold one end of the exercise band in each hand. Your palms should face down. 4. Squeeze your shoulder blades together (retraction) and move your elbows slightly behind you. Do not shrug your shoulders upward while you do this. 5. Hold for 10 seconds. 6. Slowly return to the starting position. Repeat 10 times. Complete this exercise 3 times per week.      Shoulder extension 1. Sit in a stable chair without armrests, or stand up. 2. Secure an exercise band to a stable object in front of you so the band is above shoulder height. 3. Hold one end of the exercise band in each hand. 4. Straighten your elbows and lift your hands up to shoulder height. 5. Squeeze your shoulder blades together and pull your hands down to the sides of your thighs (extension). Stop when your hands are straight down by your sides. Do not let your hands go behind your body. 6. Hold for 10 seconds. 7. Slowly return to the starting position. Repeat 10 times. Complete this exercise 3 times per week.       Scapular protraction, supine 1. Lie on your back on a firm surface (supine position). Hold a 5 lbs (or soup can) weight in your left / right hand. 2. Raise  your left / right arm straight into the air so your hand is directly above your shoulder joint. 3. Push the weight into the air so your shoulder (scapula) lifts off the surface that you are lying on. The scapula will push up or forward (protraction). Do not move your head, neck, or back. 4. Hold for 10 seconds. 5. Slowly return to the starting position. Let your muscles relax completely before you repeat this exercise.  Repeat 10 times. Complete this exercise 3 times per week.

## 2021-03-15 ENCOUNTER — Ambulatory Visit (INDEPENDENT_AMBULATORY_CARE_PROVIDER_SITE_OTHER): Payer: Medicare Other | Admitting: Neurology

## 2021-03-15 ENCOUNTER — Other Ambulatory Visit: Payer: Self-pay

## 2021-03-15 DIAGNOSIS — G4733 Obstructive sleep apnea (adult) (pediatric): Secondary | ICD-10-CM | POA: Diagnosis not present

## 2021-03-15 DIAGNOSIS — Z789 Other specified health status: Secondary | ICD-10-CM

## 2021-03-15 DIAGNOSIS — Z9884 Bariatric surgery status: Secondary | ICD-10-CM

## 2021-03-15 DIAGNOSIS — G4761 Periodic limb movement disorder: Secondary | ICD-10-CM

## 2021-03-15 DIAGNOSIS — I639 Cerebral infarction, unspecified: Secondary | ICD-10-CM

## 2021-03-16 ENCOUNTER — Ambulatory Visit (HOSPITAL_COMMUNITY)
Admission: RE | Admit: 2021-03-16 | Discharge: 2021-03-16 | Disposition: A | Discharge status: Home / Self Care | Payer: Medicare Other | Source: Ambulatory Visit | Attending: Physician Assistant | Admitting: Physician Assistant

## 2021-03-16 DIAGNOSIS — Z1231 Encounter for screening mammogram for malignant neoplasm of breast: Secondary | ICD-10-CM | POA: Diagnosis not present

## 2021-03-22 DIAGNOSIS — H35033 Hypertensive retinopathy, bilateral: Secondary | ICD-10-CM | POA: Diagnosis not present

## 2021-03-24 NOTE — Progress Notes (Signed)
PCP is in [place], [place]. IMPRESSION:  1. Obstructive Sleep Apnea responded well to 8 cm water CPAP with 1 cm EPR and FFM F 30 i.  2. Snoring also was controlled.  PLANS/RECOMMENDATIONS: 1. Any apnea patient should avoid sedatives, hypnotics, and alcohol consumption at bedtime. 2. CPAP therapy compliance is defined as 4 hours or more of nightly use.

## 2021-03-24 NOTE — Addendum Note (Signed)
Addended by: Larey Seat on: 03/24/2021 05:41 PM   Modules accepted: Orders

## 2021-03-24 NOTE — Procedures (Signed)
PATIENT'S NAME:  Virginia, [name] DOB:      [birth year]      MR#:    [id number]     DATE OF RECORDING: 03/15/2021 [facility] REFERRING M.D.:  Waylan Rocher, PA-C Study Performed:   CPAP  Titration HISTORY:  Virginia [name] , seen 12-29-2020, is a right-handed Caucasian female with a History of OSA sleep disorder, who returned for an attended CPAP titration study.  Her PSG was preformed 02-13-2021: MILD OSA was found, hypoxemia and PLMs, UARS.  She has a medical history of Anxiety, Depression, HTN (hypertension), Hypothyroidism, Osteoporosis, RLS (restless legs syndrome), Sleep apnea, and CVA-Stroke ([place]).  She is a post-gastric bypass patient, and since she lost a lot of weight she had discontinued the therapy. She was treated last year for anemia and thyroiditis, partially attributed to malabsorption. Also hearing loss and macular edema. Sleep relevant medical history: Restless leg syndrome, obstructive sleep apnea currently untreated, generalized anxiety disorder, moderate depression, stroke ischemic, and impaired glucose tolerance.  Her new primary care physician has provided alprazolam 0.5 mg when necessary for severe anxiety but also voiced concern about the untreated obstructive sleep apnea.  She did undergo a gastric bypass surgery and lost 100 pounds the symptoms seem to resolve with weight loss and therefore she stopped using the CPAP therapy.    The total APNEA/HYPOPNEA INDEX (AHI) was 14.1/h. and the total RESPIRATORY DISTURBANCE INDEX was 18.1 /hour. 7 events occurred in REM sleep and 178 events in NREM. The REM AHI was 56 /hour, versus a non-REM AHI of 13.3. The patient spent 229.5 minutes of total sleep time in the supine position and 196 minutes in non-supine. The supine AHI was 25.1/h versus a non-supine AHI of 1.2.  The patient endorsed the Epworth Sleepiness Scale at 11 points.   [name] weight 194 pounds with a height of 63 (inches), resulting in a BMI of 34.4 kg/m2. [name] neck  circumference measured 14 inches.  CURRENT MEDICATIONS: Xanax, Norvasc, Vitamin C, Aspirin, Lipitor, B Complex Vitamins, Buspar, Vitamin D-3, Cymbalta, Synthroid, Cozaar, Requip, Multiple Vitamins-Minerals   PROCEDURE:  This is a multichannel digital polysomnogram utilizing the SomnoStar 11.2 system.  Electrodes and sensors were applied and monitored per AASM Specifications.   EEG, EOG, Chin and Limb EMG, were sampled at 200 Hz.  ECG, Snore and Nasal Pressure, Thermal Airflow, Respiratory Effort, CPAP Flow and Pressure, Oximetry was sampled at 50 Hz. Digital video and audio were recorded.      CPAP was initiated under a ResMed F 30 I- FFM, at 5 cmH20 with heated humidity per AASM split night standards and pressure was advanced to 8 cmH20 because of hypopneas, apneas, and desaturations.  At a PAP pressure of 8 cmH20 with EPR 1 cm, there was a reduction of the AHI to 0.9/h with improvement of sleep apnea. Lights Out was at 21:22 and Lights On at 05:45. Total recording time (TRT) was 503 minutes, with a total sleep time (TST) of 422.5 minutes. [name] sleep latency was 55.5 minutes. REM latency was 106.5 minutes.  The sleep efficiency was 84.0 %.    SLEEP ARCHITECTURE: WASO (Wake after sleep onset) was 21.5 minutes. There were 18.5 minutes in Stage N1, 246.5 minutes Stage N2, 0 minutes Stage N3 and 157.5 minutes in Stage REM.  The percentage of Stage N1 was 4.4%, Stage N2 was 58.3%, Stage N3 was 0% and Stage R (REM sleep) was 37.3%.  The sleep architecture was notable for long and frequent REM sleep.  RESPIRATORY ANALYSIS:  There was a total of 7 respiratory events: 0 obstructive apneas, 7 central apneas and 0 mixed apneas with a total of 7 apneas and an apnea index (AI) of 1. /hour. There were 0 hypopneas with a hypopnea index of 0/hour.  The total APNEA/HYPOPNEA INDEX (AHI) was 1.0 /hour. 7 events occurred in REM sleep and 0 events in NREM. The REM AHI was 2.7 /hour versus a non-REM AHI of 0  /hour. The patient spent 70.5 minutes of total sleep time in the supine position and 352 minutes in non-supine. The supine AHI was 0.9, versus a non-supine AHI of 1.0.  OXYGEN SATURATION & C02:  The baseline 02 saturation was 98%, with the lowest being 91%. Time spent below 89% saturation equaled 0 minutes. The arousals were noted as: 2 were spontaneous, 5 were associated with PLMs, 0 were associated with respiratory events.  PERIODIC LIMB MOVEMENTS:  Left Leg electrode was defect- The patient had a total of (only) 47 Periodic Limb Movements. The Periodic Limb Movement (PLM) Arousal index was 0.7 /hour. No REM BD seen   Audio and video analysis did not show any abnormal or unusual movements, behaviors, phonations or vocalizations.   EKG was in keeping with normal sinus rhythm (NSR).  IMPRESSION:  1. Obstructive Sleep Apnea responded well to 8 cm water CPAP with 1 cm EPR and FFM F 30 i.  2. Snoring also was controlled.  PLANS/RECOMMENDATIONS: 1. Any apnea patient should avoid sedatives, hypnotics, and alcohol consumption at bedtime. 2. CPAP therapy compliance is defined as 4 hours or more of nightly use.   DISCUSSION: A follow up appointment will be scheduled in the Sleep Clinic at [facility].   Please call [phone] with any questions.      I certify that I have reviewed the entire raw data recording prior to the issuance of this report in accordance with the Standards of Accreditation of the American Academy of Sleep Medicine (AASM)   Larey Seat, M.D. Diplomat, Tax adviser of Psychiatry and Neurology  Diplomat, Tax adviser of Sleep Medicine Market researcher, [employer] [facility] at [employer]

## 2021-03-28 ENCOUNTER — Telehealth: Payer: Self-pay | Admitting: Neurology

## 2021-03-28 NOTE — Telephone Encounter (Signed)
-----   Message from Larey Seat, MD sent at 03/24/2021  5:41 PM EDT ----- PCP is in [place], [place]. IMPRESSION:  1. Obstructive Sleep Apnea responded well to 8 cm water CPAP with 1 cm EPR and FFM F 30 i.  2. Snoring also was controlled.  PLANS/RECOMMENDATIONS: 1. Any apnea patient should avoid sedatives, hypnotics, and alcohol consumption at bedtime. 2. CPAP therapy compliance is defined as 4 hours or more of nightly use.

## 2021-03-28 NOTE — Telephone Encounter (Signed)
I called pt. I advised pt that Dr. Brett Fairy reviewed their sleep study results and found that pt was best treated with CPAP at a pressure of 8 cm water pressure. Dr. Brett Fairy recommends that pt starts CPAP. I reviewed PAP compliance expectations with the pt. Pt is agreeable to starting a CPAP. I advised pt that an order will be sent to a DME, [facility] and [facility] ([place]) will call the pt within about one week after they file with the pt's insurance. [facility])  will show the pt how to use the machine, fit for masks, and troubleshoot the CPAP if needed. A follow up appt was made for insurance purposes with Ward Givens, NP on Sept 14,2022 at 1:30 pm. Pt verbalized understanding to arrive 15 minutes early and bring their CPAP. A letter with all of this information in it will be mailed to the pt as a reminder. I verified with the pt that the address we have on file is correct. Pt verbalized understanding of results. Pt had no questions at this time but was encouraged to call back if questions arise. I have sent the order to [facility]) and have received confirmation that they have received the order.

## 2021-04-19 ENCOUNTER — Other Ambulatory Visit: Payer: Self-pay | Admitting: Neurology

## 2021-04-19 ENCOUNTER — Encounter: Payer: Self-pay | Admitting: Neurology

## 2021-04-19 MED ORDER — ROPINIROLE HCL 0.5 MG PO TABS: 0.5000 mg | ORAL_TABLET | Freq: Every day | ORAL | 2 refills | Status: DC

## 2021-05-26 ENCOUNTER — Encounter: Payer: Self-pay | Admitting: Internal Medicine

## 2021-06-17 DIAGNOSIS — Z1389 Encounter for screening for other disorder: Secondary | ICD-10-CM | POA: Diagnosis not present

## 2021-06-17 DIAGNOSIS — E781 Pure hyperglyceridemia: Secondary | ICD-10-CM | POA: Diagnosis not present

## 2021-06-17 DIAGNOSIS — E6609 Other obesity due to excess calories: Secondary | ICD-10-CM | POA: Diagnosis not present

## 2021-06-17 DIAGNOSIS — Z6833 Body mass index (BMI) 33.0-33.9, adult: Secondary | ICD-10-CM | POA: Diagnosis not present

## 2021-06-17 DIAGNOSIS — I10 Essential (primary) hypertension: Secondary | ICD-10-CM | POA: Diagnosis not present

## 2021-06-17 DIAGNOSIS — Z1331 Encounter for screening for depression: Secondary | ICD-10-CM | POA: Diagnosis not present

## 2021-06-17 DIAGNOSIS — Z0001 Encounter for general adult medical examination with abnormal findings: Secondary | ICD-10-CM | POA: Diagnosis not present

## 2021-06-17 DIAGNOSIS — E063 Autoimmune thyroiditis: Secondary | ICD-10-CM | POA: Diagnosis not present

## 2021-06-23 DIAGNOSIS — L609 Nail disorder, unspecified: Secondary | ICD-10-CM | POA: Diagnosis not present

## 2021-06-29 ENCOUNTER — Telehealth: Payer: Self-pay | Admitting: Orthopedic Surgery

## 2021-06-29 DIAGNOSIS — M7582 Other shoulder lesions, left shoulder: Secondary | ICD-10-CM

## 2021-06-29 NOTE — Telephone Encounter (Signed)
Called Virginia [name] who states shoulder pain was only mildly better for approximately 2-3 days after injection. Virginia [name] would like to get an MRI at this time and schedule f/u with Dr. Amedeo Kinsman to review results.

## 2021-06-29 NOTE — Telephone Encounter (Signed)
Call received from patient regarding (sameproblem left shoulder/rotator cuff, last seen by Dr Amedeo Kinsman 03/02/21. States was advised to return or let Dr know if injection was not helping. Virginia [name] ''life got in the way" and just now able to call. Asking if she may have MRI ordered, or another appointment needed. Aware Dr Amedeo Kinsman will be out of clinic next week. Please call [phone].

## 2021-06-30 DIAGNOSIS — N390 Urinary tract infection, site not specified: Secondary | ICD-10-CM | POA: Diagnosis not present

## 2021-07-11 ENCOUNTER — Other Ambulatory Visit: Payer: Self-pay

## 2021-07-11 ENCOUNTER — Ambulatory Visit (HOSPITAL_COMMUNITY)
Admission: RE | Admit: 2021-07-11 | Discharge: 2021-07-11 | Disposition: A | Discharge status: Home / Self Care | Payer: Medicare Other | Source: Ambulatory Visit | Attending: Orthopedic Surgery | Admitting: Orthopedic Surgery

## 2021-07-11 DIAGNOSIS — M19012 Primary osteoarthritis, left shoulder: Secondary | ICD-10-CM | POA: Diagnosis not present

## 2021-07-11 DIAGNOSIS — M7582 Other shoulder lesions, left shoulder: Secondary | ICD-10-CM

## 2021-07-11 DIAGNOSIS — M25412 Effusion, left shoulder: Secondary | ICD-10-CM | POA: Diagnosis not present

## 2021-07-12 DIAGNOSIS — R7309 Other abnormal glucose: Secondary | ICD-10-CM | POA: Diagnosis not present

## 2021-07-12 DIAGNOSIS — E7489 Other specified disorders of carbohydrate metabolism: Secondary | ICD-10-CM | POA: Diagnosis not present

## 2021-07-12 DIAGNOSIS — N390 Urinary tract infection, site not specified: Secondary | ICD-10-CM | POA: Diagnosis not present

## 2021-07-22 ENCOUNTER — Other Ambulatory Visit: Payer: Self-pay

## 2021-07-22 ENCOUNTER — Ambulatory Visit (INDEPENDENT_AMBULATORY_CARE_PROVIDER_SITE_OTHER): Payer: Medicare Other | Admitting: Orthopedic Surgery

## 2021-07-22 ENCOUNTER — Encounter: Payer: Self-pay | Admitting: Orthopedic Surgery

## 2021-07-22 VITALS — BP 114/75 | HR 87 | Ht 63.0 in | Wt 191.0 lb

## 2021-07-22 DIAGNOSIS — M19012 Primary osteoarthritis, left shoulder: Secondary | ICD-10-CM | POA: Diagnosis not present

## 2021-07-22 NOTE — Patient Instructions (Signed)
Please call to schedule your appointment with [facility]: Central Scheduling [phone]

## 2021-07-22 NOTE — Progress Notes (Signed)
Orthopaedic Clinic Return  Assessment: Virginia [name] is a 75 y.o. female with the following: Left shoulder glenohumeral arthritis  Plan: Reviewed MRI in clinic which demonstrates advanced degenerative changes, more severe than expected based on radiographs obtained at her last clinic visit.  The previous injection only lasted 2-3 days.  We discussed further treatment options including an image guided glenohumeral joint injection, formal physical therapy, and consideration for shoulder arthroplasty.  We have referred her for a shoulder injection, and she will start some physical therapy.  I provided her pamphlets for shoulder arthroplasty, and these can be reviewed at her leisure.  If she continues to have issues, I think the neck step is further consideration for surgery.  All questions were answered.  We will see her back if she has any further issues    Follow-up: Return if symptoms worsen or fail to improve.   Subjective:  Chief Complaint  Patient presents with   Shoulder Pain    Lt shoulder pain, pt here for MRI results.     History of Present Illness: Virginia [name] is a 75 y.o. female who returns to clinic for repeat evaluation of her left shoulder.  She was seen in clinic several months ago, at which time she received a subacromial steroid injection.  She states that improved her pain for just a few days, and then the pain and discomfort slowly returned.  She had many other things going on in her life at that time, and did not get in touch with [place] immediately.  Nonetheless, she had an MRI left shoulder, presents today for further evaluation and discussion about treatment options.  Continues to have pain, deep within the left shoulder, as well as over the lateral shoulder.  Medications are not improving her symptoms.  She has limited range of motion and function of the left shoulder as a result  Review of Systems: No fevers or chills No numbness or tingling No chest pain No  shortness of breath No bowel or bladder dysfunction No GI distress No headaches   Objective: BP 114/75   Pulse 87   Ht '5\' 3"'$  (1.6 m)   Wt 191 lb (86.6 kg)   BMI 33.83 kg/m   Physical Exam:  Alert and oriented.  No acute distress.  Evaluation left shoulder demonstrates no deformity.  Forward flexion is limited to 75 degrees.  Abduction to 75 degrees.  Internal rotation to the side of her hip.  Strength testing is deferred.  Passive forward flexion to approximately 100 degrees, with obvious discomfort.  Fingers are warm and well-perfused.  2+ radial pulse.  IMAGING: I personally ordered and reviewed the following images:  MRI of the left shoulder demonstrates supraspinatus tendinopathy, without distinct tearing.  She has advanced degenerative changes with full-thickness cartilage loss, as well as cysts within the glenoid, as well as the humeral head.  Small osteophytes at the inferior aspect of the humeral head and glenoid  Mordecai Rasmussen, MD 07/22/2021 12:16 PM

## 2021-07-26 ENCOUNTER — Telehealth: Payer: Self-pay | Admitting: Orthopedic Surgery

## 2021-07-26 NOTE — Telephone Encounter (Signed)
Patient called to relay that she has been thinking things over, and has decided to move forward with scheduling the shoulder replacement surgery. Please advise.

## 2021-07-27 ENCOUNTER — Encounter (HOSPITAL_COMMUNITY): Payer: Self-pay

## 2021-07-27 ENCOUNTER — Other Ambulatory Visit: Payer: Self-pay

## 2021-07-27 ENCOUNTER — Ambulatory Visit (HOSPITAL_COMMUNITY)
Admission: RE | Admit: 2021-07-27 | Discharge: 2021-07-27 | Disposition: A | Discharge status: Home / Self Care | Payer: Medicare Other | Source: Ambulatory Visit | Attending: Orthopedic Surgery | Admitting: Orthopedic Surgery

## 2021-07-27 ENCOUNTER — Ambulatory Visit (HOSPITAL_COMMUNITY): Payer: Medicare Other

## 2021-07-27 DIAGNOSIS — M19012 Primary osteoarthritis, left shoulder: Secondary | ICD-10-CM | POA: Diagnosis not present

## 2021-07-27 MED ORDER — IOHEXOL 300 MG/ML  SOLN
30.0000 mL | Freq: Once | INTRAMUSCULAR | Status: AC | PRN
  Administered 2021-07-27: 3 mL via INTRA_ARTICULAR

## 2021-07-27 MED ORDER — LIDOCAINE HCL (PF) 1 % IJ SOLN
INTRAMUSCULAR | Status: AC
  Administered 2021-07-27: 5 mL
  Filled 2021-07-27: qty 5

## 2021-07-27 MED ORDER — POVIDONE-IODINE 10 % EX SOLN
CUTANEOUS | Status: AC
  Administered 2021-07-27: 1
  Filled 2021-07-27: qty 15

## 2021-07-27 MED ORDER — METHYLPREDNISOLONE ACETATE 40 MG/ML IJ SUSP
INTRAMUSCULAR | Status: AC
  Administered 2021-07-27: 40 mg
  Filled 2021-07-27: qty 1

## 2021-07-27 MED ORDER — BUPIVACAINE HCL (PF) 0.5 % IJ SOLN
INTRAMUSCULAR | Status: AC
  Administered 2021-07-27: 150 mg
  Filled 2021-07-27: qty 30

## 2021-07-27 NOTE — Procedures (Signed)
Preprocedure Dx: LEFT shoulder joint pain, LEFT glenohumeral arthritis Postprocedure Dx: LEFT shoulder joint pain, LEFT glenohumeral arthritis Procedure  Fluoroscopically guided LEFT shoulder joint injection, therapeutic Radiologist:  Thornton Papas Anesthesia:  5 ml of 1% lidocaine Injectate:  40 mg depomedrol, 3 ml )% sensorcaine Fluoro time:  0 minutes 24 seconds EBL:   none Complications: none

## 2021-07-28 ENCOUNTER — Encounter: Payer: Self-pay | Admitting: Internal Medicine

## 2021-07-28 ENCOUNTER — Ambulatory Visit: Payer: Medicare Other | Admitting: Gastroenterology

## 2021-07-28 NOTE — Telephone Encounter (Signed)
Pt just had her guided injection 07/27/21, called to see if she still wanted to proceed with shoulder replacement surgery. Pt states she would like to skip doing OT since she doesn't feel like it will help her shoulder if it's bone-on-bone. I did let her know that since she just had the injection that surgery may be pushed out a bit, but I will send information to provider for scheduling options at this time.

## 2021-08-03 ENCOUNTER — Encounter: Payer: Self-pay | Admitting: Orthopedic Surgery

## 2021-08-03 NOTE — Telephone Encounter (Signed)
Letter sent to PCP for clearance.

## 2021-08-09 ENCOUNTER — Ambulatory Visit: Payer: Medicare Other | Admitting: Internal Medicine

## 2021-08-09 ENCOUNTER — Other Ambulatory Visit: Payer: Self-pay

## 2021-08-09 DIAGNOSIS — M19012 Primary osteoarthritis, left shoulder: Secondary | ICD-10-CM

## 2021-08-09 DIAGNOSIS — M7582 Other shoulder lesions, left shoulder: Secondary | ICD-10-CM

## 2021-08-10 ENCOUNTER — Ambulatory Visit: Payer: Self-pay | Admitting: Adult Health

## 2021-08-10 DIAGNOSIS — N390 Urinary tract infection, site not specified: Secondary | ICD-10-CM | POA: Diagnosis not present

## 2021-08-18 DIAGNOSIS — E559 Vitamin D deficiency, unspecified: Secondary | ICD-10-CM | POA: Diagnosis not present

## 2021-08-23 ENCOUNTER — Telehealth: Payer: Self-pay

## 2021-08-23 NOTE — Telephone Encounter (Signed)
Called pt to schedule surgery consult, appointment booked. Pt is to have her preop CT 10/3, advised pt to bring any questions or concerns she may have on this date.

## 2021-08-29 ENCOUNTER — Other Ambulatory Visit: Payer: Self-pay

## 2021-08-29 ENCOUNTER — Ambulatory Visit (HOSPITAL_COMMUNITY)
Admission: RE | Admit: 2021-08-29 | Discharge: 2021-08-29 | Disposition: A | Discharge status: Home / Self Care | Payer: Medicare Other | Source: Ambulatory Visit | Attending: Orthopedic Surgery | Admitting: Orthopedic Surgery

## 2021-08-29 DIAGNOSIS — Z01818 Encounter for other preprocedural examination: Secondary | ICD-10-CM | POA: Diagnosis not present

## 2021-08-29 DIAGNOSIS — M7532 Calcific tendinitis of left shoulder: Secondary | ICD-10-CM | POA: Diagnosis not present

## 2021-08-29 DIAGNOSIS — M25412 Effusion, left shoulder: Secondary | ICD-10-CM | POA: Diagnosis not present

## 2021-08-29 DIAGNOSIS — M19012 Primary osteoarthritis, left shoulder: Secondary | ICD-10-CM | POA: Diagnosis not present

## 2021-08-29 DIAGNOSIS — M7582 Other shoulder lesions, left shoulder: Secondary | ICD-10-CM | POA: Diagnosis not present

## 2021-08-31 ENCOUNTER — Other Ambulatory Visit: Payer: Self-pay

## 2021-08-31 ENCOUNTER — Ambulatory Visit (INDEPENDENT_AMBULATORY_CARE_PROVIDER_SITE_OTHER): Payer: Medicare Other | Admitting: Orthopedic Surgery

## 2021-08-31 DIAGNOSIS — M19012 Primary osteoarthritis, left shoulder: Secondary | ICD-10-CM | POA: Diagnosis not present

## 2021-08-31 DIAGNOSIS — M25812 Other specified joint disorders, left shoulder: Secondary | ICD-10-CM

## 2021-09-01 ENCOUNTER — Encounter: Payer: Self-pay | Admitting: Orthopedic Surgery

## 2021-09-01 NOTE — Progress Notes (Signed)
Orthopaedic Clinic Return  Assessment: Virginia [name] is a 75 y.o. female with the following: Left shoulder glenohumeral arthritis  Plan: Patient has ongoing pain in her left shoulder.  Subacromial injection lasted 2-3 days.  Image guided glenohumeral joint injection provided excellent pain relief for approximately 1 month.  However, her pain is gradually returned.  At this point, she has exhausted all nonoperative measures.  She is interested in proceeding with surgery.  She has obtained medical clearance.  CT scan was completed, and we reviewed the results in clinic today.  We will plan to proceed with surgery November 3.  We have previously discussed reverse shoulder arthroplasty, but may also consider anatomic shoulder arthroplasty, depending on preoperative templating.  She will stop her aspirin 4 to 5 days prior to surgery.  Risks and benefits of the surgery, including, but not limited to infection, bleeding, persistent pain, need for further surgery, implant loosening, damage to surrounding structures and more severe complications associated with anesthesia were discussed with the patient.  The patient has elected to proceed.   Follow-up: Return for 2 weeks After surgery; DOS 09/29/2021.   Subjective:  Chief Complaint  Patient presents with   CT results    Discuss surgery    History of Present Illness: Virginia [name] is a 75 y.o. female who returns to clinic for repeat evaluation of her left shoulder.  She has had progressively worsening left shoulder pain over the past several months.  She has had a subacromial injection which improved her pain for a few days.  She is also had a image guided glenohumeral joint injection which improved her symptoms significantly for several weeks.  However, her pain is returned.  She is now interested in proceeding with total shoulder arthroplasty.  She is obtain medical clearance.  She has completed a CT scan for preoperative templating.  She is  ready discussed the surgery in full detail, and to set a surgery date.  Review of Systems: No fevers or chills No numbness or tingling No chest pain No shortness of breath No bowel or bladder dysfunction No GI distress No headaches   Objective: There were no vitals taken for this visit.  Physical Exam:  Alert and oriented.  No acute distress.  Limited forward flexion to less than 90 degrees.  Abduction to approximately 80 degrees.  Internal rotation to her lateral hip.  Pain with passive motion beyond these parameters.  Fingers are warm and well-perfused.  Sensation is intact in the axillary nerve distribution.  Sensation is intact throughout the left hand.  IMAGING: I personally ordered and reviewed the following images:  CT scan left shoulder  IMPRESSION: Moderate glenohumeral osteoarthritis. Adequate glenoid bone stock with neutral glenoid version. Small joint effusion. Small ossified fragment along the inferior glenoid, possibly prior trauma or small ossified joint body. No significant muscle atrophy.   Moderate AC joint arthropathy.   Calcific tendinosis of the distal supraspinatus tendon.  Mordecai Rasmussen, MD 09/01/2021 9:03 AM

## 2021-09-01 NOTE — H&P (View-Only) (Signed)
Orthopaedic Clinic Return  Assessment: Virginia [name] is a 75 y.o. female with the following: Left shoulder glenohumeral arthritis  Plan: Patient has ongoing pain in her left shoulder.  Subacromial injection lasted 2-3 days.  Image guided glenohumeral joint injection provided excellent pain relief for approximately 1 month.  However, her pain is gradually returned.  At this point, she has exhausted all nonoperative measures.  She is interested in proceeding with surgery.  She has obtained medical clearance.  CT scan was completed, and we reviewed the results in clinic today.  We will plan to proceed with surgery November 3.  We have previously discussed reverse shoulder arthroplasty, but may also consider anatomic shoulder arthroplasty, depending on preoperative templating.  She will stop her aspirin 4 to 5 days prior to surgery.  Risks and benefits of the surgery, including, but not limited to infection, bleeding, persistent pain, need for further surgery, implant loosening, damage to surrounding structures and more severe complications associated with anesthesia were discussed with the patient.  The patient has elected to proceed.   Follow-up: Return for 2 weeks After surgery; DOS 09/29/2021.   Subjective:  Chief Complaint  Patient presents with   CT results    Discuss surgery    History of Present Illness: Virginia [name] is a 75 y.o. female who returns to clinic for repeat evaluation of her left shoulder.  She has had progressively worsening left shoulder pain over the past several months.  She has had a subacromial injection which improved her pain for a few days.  She is also had a image guided glenohumeral joint injection which improved her symptoms significantly for several weeks.  However, her pain is returned.  She is now interested in proceeding with total shoulder arthroplasty.  She is obtain medical clearance.  She has completed a CT scan for preoperative templating.  She is  ready discussed the surgery in full detail, and to set a surgery date.  Review of Systems: No fevers or chills No numbness or tingling No chest pain No shortness of breath No bowel or bladder dysfunction No GI distress No headaches   Objective: There were no vitals taken for this visit.  Physical Exam:  Alert and oriented.  No acute distress.  Limited forward flexion to less than 90 degrees.  Abduction to approximately 80 degrees.  Internal rotation to her lateral hip.  Pain with passive motion beyond these parameters.  Fingers are warm and well-perfused.  Sensation is intact in the axillary nerve distribution.  Sensation is intact throughout the left hand.  IMAGING: I personally ordered and reviewed the following images:  CT scan left shoulder  IMPRESSION: Moderate glenohumeral osteoarthritis. Adequate glenoid bone stock with neutral glenoid version. Small joint effusion. Small ossified fragment along the inferior glenoid, possibly prior trauma or small ossified joint body. No significant muscle atrophy.   Moderate AC joint arthropathy.   Calcific tendinosis of the distal supraspinatus tendon.  Mordecai Rasmussen, MD 09/01/2021 9:03 AM

## 2021-09-05 DIAGNOSIS — I10 Essential (primary) hypertension: Secondary | ICD-10-CM | POA: Diagnosis not present

## 2021-09-05 DIAGNOSIS — E063 Autoimmune thyroiditis: Secondary | ICD-10-CM | POA: Diagnosis not present

## 2021-09-05 DIAGNOSIS — R8281 Pyuria: Secondary | ICD-10-CM | POA: Diagnosis not present

## 2021-09-05 DIAGNOSIS — N952 Postmenopausal atrophic vaginitis: Secondary | ICD-10-CM | POA: Diagnosis not present

## 2021-09-05 DIAGNOSIS — E6609 Other obesity due to excess calories: Secondary | ICD-10-CM | POA: Diagnosis not present

## 2021-09-05 DIAGNOSIS — Z6833 Body mass index (BMI) 33.0-33.9, adult: Secondary | ICD-10-CM | POA: Diagnosis not present

## 2021-09-05 DIAGNOSIS — Z23 Encounter for immunization: Secondary | ICD-10-CM | POA: Diagnosis not present

## 2021-09-12 DIAGNOSIS — N390 Urinary tract infection, site not specified: Secondary | ICD-10-CM | POA: Diagnosis not present

## 2021-09-12 DIAGNOSIS — L609 Nail disorder, unspecified: Secondary | ICD-10-CM | POA: Diagnosis not present

## 2021-09-26 ENCOUNTER — Ambulatory Visit: Payer: Medicare Other | Admitting: Gastroenterology

## 2021-09-26 NOTE — Patient Instructions (Signed)
Virginia [name]  09/26/2021     @PREFPERIOPPHARMACY @   Your procedure is scheduled on  09/29/2021.   Report to [facility] at  [phone] A.M.   Call this number if you have problems the morning of surgery:  [phone]   Remember:  Do not eat or drink after midnight.      Take these medicines the morning of surgery with A SIP OF WATER     xanax (if needed), amlodipine, cymbalta, synthroid.     Do not wear jewelry, make-up or nail polish.  Do not wear lotions, powders, or perfumes, or deodorant.  Do not shave 48 hours prior to surgery.  Men may shave face and neck.  Do not bring valuables to the hospital.  [facility] is not responsible for any belongings or valuables.  Contacts, dentures or bridgework may not be worn into surgery.  Leave your suitcase in the car.  After surgery it may be brought to your room.  For patients admitted to the hospital, discharge time will be determined by your treatment team.  Patients discharged the day of surgery will not be allowed to drive home and must have someone with them for 24 hours.    Special instructions:   DO NOT smoke tobacco or vape for 24 hours before your procedure.  Please read over the following fact sheets that you were given. Coughing and Deep Breathing, Surgical Site Infection Prevention, Anesthesia Post-op Instructions, and Care and Recovery After Surgery      Shoulder Replacement, Care After This sheet gives you information about how to care for yourself after your procedure. Your health care provider may also give you more specific instructions. If you have problems or questions, contact your health care provider. What can I expect after the procedure? After the procedure, it is common to have: A bruised and stiff shoulder and arm. Some shoulder and arm pain. Follow these instructions at home: Medicines Take over-the-counter and prescription medicines only as told by your health care provider. If you  were prescribed an antibiotic medicine, use it as told by your health care provider. Do not stop using the antibiotic even if you start to feel better. Ask your health care provider if the medicine prescribed to you can cause constipation. You may need to take these actions to prevent or treat constipation: Drink enough fluid to keep your urine pale yellow. Take over-the-counter or prescription medicines. Eat foods that are high in fiber, such as beans, whole grains, and fresh fruits and vegetables. Limit foods that are high in fat and processed sugars, such as fried or sweet foods. If you have a sling or immobilizer:  Wear the sling or immobilizer as told by your health care provider. Remove it only as told by your health care provider. Loosen the sling or immobilizer if your fingers tingle, become numb, or turn cold and blue. Keep the sling or immobilizer clean and dry. Bathing Do not take baths, swim, or use a hot tub until your health care provider approves. Ask your health care provider if you can take showers. You may only be allowed to take sponge baths. If your sling or immobilizer is not waterproof: Do not let it get wet. Cover it with a watertight covering when you take a bath or a shower. Keep the bandage (dressing) dry until your health care provider says it can be removed. Incision care  Follow instructions from your health care provider about how to  take care of your incision. Make sure you: Wash your hands with soap and water for at least 20 seconds before and after you change your dressing. If soap and water are not available, use hand sanitizer. Change your dressing as told by your health care provider. Leave staples, stitches (sutures), skin glue, or adhesive strips in place. These skin closures may need to stay in place for 2 weeks or longer. If adhesive strip edges start to loosen and curl up, you may trim the loose edges. Do not remove adhesive strips completely unless your  health care provider tells you to do that. If you have a tube to remove drainage, follow instructions from your health care provider about caring for it. Do not remove the drain tube or any dressings around the tube opening unless your health care provider approves. Check your incision area every day for signs of infection. Check for: More redness, swelling, or pain. More fluid or blood. Warmth. Pus or a bad smell. Managing pain, stiffness, and swelling  If directed, put ice on the affected area. To do this: If you have a removable sling, remove it as told by your health care provider. Put ice in a plastic bag. Place a towel between your skin and the bag. Leave the ice on for 20 minutes, 2-3 times a day. Remove the ice if your skin turns bright red. This is very important. If you cannot feel pain, heat, or cold, you have a greater risk of damage to the area. If you have an icing device, use it as directed by your health care provider. Move your fingers and elbow often to reduce stiffness and swelling. Activity Do not use your arm to push yourself up in bed or from a chair. Follow lifting restrictions as told: Do not lift anything that is heavier than a cup of coffee for the first 6 weeks after surgery, or as told by your health care provider. Do not lift anything that is heavier than 10 lb (4.5 kg), or the limit that you are told, for 6 months or until your health care provider says that it is safe. Do exercises, including physical therapy, as told by your health care provider. Try not to overuse your shoulder. This includes repetitive pushing or pulling. Early overuse of the shoulder may result in later problems. (Overusing the shoulder is easy to do when your pain goes away for the first time.) Avoid overstretching your arm for 6 weeks after surgery, or as told by your health care provider. Avoid sitting for a long time without moving. Get up to take short walks every 1-2 hours. This is  important to improve blood flow and breathing. Ask for help if you feel weak or unsteady. Ask for help with some activities. Your health care provider may be able to suggest a clinic or agency for this if you do not have home support. Do not participate in contact sports. Driving Ask your health care provider if the medicine prescribed to you requires you to avoid driving or using machinery. Do not drive for 2-4 weeks after surgery or as told by your health care provider. General instructions Tell your health care provider if you plan to have dental work. Also: Tell your dentist about your joint replacement. Ask your health care provider if there are any special instructions you need to follow before having dental care and routine cleanings. Do not use any products that contain nicotine or tobacco, such as cigarettes, e-cigarettes, and chewing tobacco.  These can delay healing. If you need help quitting, ask your health care provider. Keep all follow-up visits. This is important. Contact a health care provider if: You develop a rash. You have a fever. You have any of these signs of infection in your incision area: More redness, swelling, or pain. More fluid or blood. Warmth. Pus or a bad smell. Get help right away if: The edges of the incision site break open after sutures have been removed. You have redness, swelling, pain, or warmth in your leg or arm. You have chest pain or shortness of breath. These symptoms may represent a serious problem that is an emergency. Do not wait to see if the symptoms will go away. Get medical help right away. Call your local emergency services (911 in the [place] Do not drive yourself to the hospital. Summary It is common to have pain and stiffness in your shoulder and arm after the procedure. Put ice on the affected area and take pain medicine as told by your health care provider. Do not use your arm to push yourself up in bed or from a chair. Do exercises,  including physical therapy, as told by your health care provider. Check your incision area daily. Call your health care provider if you see signs of infection. This information is not intended to replace advice given to you by your health care provider. Make sure you discuss any questions you have with your health care provider. Document Revised: 04/28/2020 Document Reviewed: 04/28/2020 Elsevier Patient Education  2022 [employer]. How to Use Chlorhexidine for Bathing Chlorhexidine gluconate (CHG) is a germ-killing (antiseptic) solution that is used to clean the skin. It can get rid of the bacteria that normally live on the skin and can keep them away for about 24 hours. To clean your skin with CHG, you may be given: A CHG solution to use in the shower or as part of a sponge bath. A prepackaged cloth that contains CHG. Cleaning your skin with CHG may help lower the risk for infection: While you are staying in the intensive care unit of the hospital. If you have a vascular access, such as a central line, to provide short-term or long-term access to your veins. If you have a catheter to drain urine from your bladder. If you are on a ventilator. A ventilator is a machine that helps you breathe by moving air in and out of your lungs. After surgery. What are the risks? Risks of using CHG include: A skin reaction. Hearing loss, if CHG gets in your ears and you have a perforated eardrum. Eye injury, if CHG gets in your eyes and is not rinsed out. The CHG product catching fire. Make sure that you avoid smoking and flames after applying CHG to your skin. Do not use CHG: If you have a chlorhexidine allergy or have previously reacted to chlorhexidine. On babies younger than 69 months of age. How to use CHG solution Use CHG only as told by your health care provider, and follow the instructions on the label. Use the full amount of CHG as directed. Usually, this is one bottle. During a shower Follow  these steps when using CHG solution during a shower (unless your health care provider gives you different instructions): Start the shower. Use your normal soap and shampoo to wash your face and hair. Turn off the shower or move out of the shower stream. Pour the CHG onto a clean washcloth. Do not use any type of brush or rough-edged  sponge. Starting at your neck, lather your body down to your toes. Make sure you follow these instructions: If you will be having surgery, pay special attention to the part of your body where you will be having surgery. Scrub this area for at least 1 minute. Do not use CHG on your head or face. If the solution gets into your ears or eyes, rinse them well with water. Avoid your genital area. Avoid any areas of skin that have broken skin, cuts, or scrapes. Scrub your back and under your arms. Make sure to wash skin folds. Let the lather sit on your skin for 1-2 minutes or as long as told by your health care provider. Thoroughly rinse your entire body in the shower. Make sure that all body creases and crevices are rinsed well. Dry off with a clean towel. Do not put any substances on your body afterward--such as powder, lotion, or perfume--unless you are told to do so by your health care provider. Only use lotions that are recommended by the manufacturer. Put on clean clothes or pajamas. If it is the night before your surgery, sleep in clean sheets.  During a sponge bath Follow these steps when using CHG solution during a sponge bath (unless your health care provider gives you different instructions): Use your normal soap and shampoo to wash your face and hair. Pour the CHG onto a clean washcloth. Starting at your neck, lather your body down to your toes. Make sure you follow these instructions: If you will be having surgery, pay special attention to the part of your body where you will be having surgery. Scrub this area for at least 1 minute. Do not use CHG on your  head or face. If the solution gets into your ears or eyes, rinse them well with water. Avoid your genital area. Avoid any areas of skin that have broken skin, cuts, or scrapes. Scrub your back and under your arms. Make sure to wash skin folds. Let the lather sit on your skin for 1-2 minutes or as long as told by your health care provider. Using a different clean, wet washcloth, thoroughly rinse your entire body. Make sure that all body creases and crevices are rinsed well. Dry off with a clean towel. Do not put any substances on your body afterward--such as powder, lotion, or perfume--unless you are told to do so by your health care provider. Only use lotions that are recommended by the manufacturer. Put on clean clothes or pajamas. If it is the night before your surgery, sleep in clean sheets. How to use CHG prepackaged cloths Only use CHG cloths as told by your health care provider, and follow the instructions on the label. Use the CHG cloth on clean, dry skin. Do not use the CHG cloth on your head or face unless your health care provider tells you to. When washing with the CHG cloth: Avoid your genital area. Avoid any areas of skin that have broken skin, cuts, or scrapes. Before surgery Follow these steps when using a CHG cloth to clean before surgery (unless your health care provider gives you different instructions): Using the CHG cloth, vigorously scrub the part of your body where you will be having surgery. Scrub using a back-and-forth motion for 3 minutes. The area on your body should be completely wet with CHG when you are done scrubbing. Do not rinse. Discard the cloth and let the area air-dry. Do not put any substances on the area afterward, such as powder, lotion,  or perfume. Put on clean clothes or pajamas. If it is the night before your surgery, sleep in clean sheets.  For general bathing Follow these steps when using CHG cloths for general bathing (unless your health care  provider gives you different instructions). Use a separate CHG cloth for each area of your body. Make sure you wash between any folds of skin and between your fingers and toes. Wash your body in the following order, switching to a new cloth after each step: The front of your neck, shoulders, and chest. Both of your arms, under your arms, and your hands. Your stomach and groin area, avoiding the genitals. Your right leg and foot. Your left leg and foot. The back of your neck, your back, and your buttocks. Do not rinse. Discard the cloth and let the area air-dry. Do not put any substances on your body afterward--such as powder, lotion, or perfume--unless you are told to do so by your health care provider. Only use lotions that are recommended by the manufacturer. Put on clean clothes or pajamas. Contact a health care provider if: Your skin gets irritated after scrubbing. You have questions about using your solution or cloth. You swallow any chlorhexidine. Call your local poison control center (1-5065812619 in the [place] Get help right away if: Your eyes itch badly, or they become very red or swollen. Your skin itches badly and is red or swollen. Your hearing changes. You have trouble seeing. You have swelling or tingling in your mouth or throat. You have trouble breathing. These symptoms may represent a serious problem that is an emergency. Do not wait to see if the symptoms will go away. Get medical help right away. Call your local emergency services (911 in the [place] Do not drive yourself to the hospital. Summary Chlorhexidine gluconate (CHG) is a germ-killing (antiseptic) solution that is used to clean the skin. Cleaning your skin with CHG may help to lower your risk for infection. You may be given CHG to use for bathing. It may be in a bottle or in a prepackaged cloth to use on your skin. Carefully follow your health care provider's instructions and the instructions on the product  label. Do not use CHG if you have a chlorhexidine allergy. Contact your health care provider if your skin gets irritated after scrubbing. This information is not intended to replace advice given to you by your health care provider. Make sure you discuss any questions you have with your health care provider. Document Revised: 01/24/2021 Document Reviewed: 01/24/2021 Elsevier Patient Education  2022 [employer].

## 2021-09-27 ENCOUNTER — Other Ambulatory Visit (HOSPITAL_COMMUNITY)
Admission: RE | Admit: 2021-09-27 | Discharge: 2021-09-27 | Disposition: A | Discharge status: Home / Self Care | Payer: Medicare Other | Source: Ambulatory Visit | Attending: Orthopedic Surgery | Admitting: Orthopedic Surgery

## 2021-09-27 ENCOUNTER — Encounter (HOSPITAL_COMMUNITY)
Admission: RE | Admit: 2021-09-27 | Discharge: 2021-09-27 | Disposition: A | Discharge status: Home / Self Care | Payer: Medicare Other | Source: Ambulatory Visit | Attending: Orthopedic Surgery | Admitting: Orthopedic Surgery

## 2021-09-27 VITALS — BP 125/77 | HR 79 | Temp 97.6°F | Resp 20 | Ht 63.0 in | Wt 190.0 lb

## 2021-09-27 DIAGNOSIS — M19012 Primary osteoarthritis, left shoulder: Secondary | ICD-10-CM | POA: Insufficient documentation

## 2021-09-27 DIAGNOSIS — Z20822 Contact with and (suspected) exposure to covid-19: Secondary | ICD-10-CM | POA: Diagnosis not present

## 2021-09-27 DIAGNOSIS — M25812 Other specified joint disorders, left shoulder: Secondary | ICD-10-CM | POA: Insufficient documentation

## 2021-09-27 DIAGNOSIS — Z01818 Encounter for other preprocedural examination: Secondary | ICD-10-CM | POA: Diagnosis not present

## 2021-09-27 LAB — BASIC METABOLIC PANEL
Anion gap: 7 (ref 5–15)
BUN: 15 mg/dL (ref 8–23)
CO2: 26 mmol/L (ref 22–32)
Calcium: 9.1 mg/dL (ref 8.9–10.3)
Chloride: 103 mmol/L (ref 98–111)
Creatinine, Ser: 1.07 mg/dL — ABNORMAL HIGH (ref 0.44–1.00)
GFR, Estimated: 55 mL/min — ABNORMAL LOW (ref >60)
Glucose, Bld: 85 mg/dL (ref 70–99)
Potassium: 4.1 mmol/L (ref 3.5–5.1)
Sodium: 136 mmol/L (ref 135–145)

## 2021-09-27 LAB — CBC
HCT: 38.5 % (ref 36.0–46.0)
Hemoglobin: 12.1 g/dL (ref 12.0–15.0)
MCH: 28.3 pg (ref 26.0–34.0)
MCHC: 31.4 g/dL (ref 30.0–36.0)
MCV: 90.2 fL (ref 80.0–100.0)
Platelets: 304 10*3/uL (ref 150–400)
RBC: 4.27 MIL/uL (ref 3.87–5.11)
RDW: 14 % (ref 11.5–15.5)
WBC: 6.7 10*3/uL (ref 4.0–10.5)
nRBC: 0 % (ref 0.0–0.2)

## 2021-09-28 LAB — SARS CORONAVIRUS 2 (TAT 6-24 HRS): SARS Coronavirus 2: NEGATIVE

## 2021-09-29 ENCOUNTER — Ambulatory Visit (HOSPITAL_COMMUNITY): Payer: Medicare Other | Admitting: Certified Registered"

## 2021-09-29 ENCOUNTER — Other Ambulatory Visit: Payer: Self-pay

## 2021-09-29 ENCOUNTER — Encounter (HOSPITAL_COMMUNITY): Payer: Self-pay | Admitting: Orthopedic Surgery

## 2021-09-29 ENCOUNTER — Observation Stay (HOSPITAL_COMMUNITY)
Admission: RE | Admit: 2021-09-29 | Discharge: 2021-09-29 | Disposition: A | Discharge status: Home / Self Care | Payer: Medicare Other | Attending: Orthopedic Surgery | Admitting: Orthopedic Surgery

## 2021-09-29 ENCOUNTER — Ambulatory Visit (HOSPITAL_COMMUNITY): Payer: Medicare Other

## 2021-09-29 ENCOUNTER — Encounter (HOSPITAL_COMMUNITY)
Admission: RE | Disposition: A | Discharge status: Home / Self Care | Payer: Self-pay | Source: Home / Self Care | Attending: Orthopedic Surgery

## 2021-09-29 DIAGNOSIS — Z96612 Presence of left artificial shoulder joint: Secondary | ICD-10-CM | POA: Diagnosis not present

## 2021-09-29 DIAGNOSIS — G4730 Sleep apnea, unspecified: Secondary | ICD-10-CM | POA: Diagnosis not present

## 2021-09-29 DIAGNOSIS — Z79899 Other long term (current) drug therapy: Secondary | ICD-10-CM | POA: Insufficient documentation

## 2021-09-29 DIAGNOSIS — E039 Hypothyroidism, unspecified: Secondary | ICD-10-CM | POA: Insufficient documentation

## 2021-09-29 DIAGNOSIS — M25812 Other specified joint disorders, left shoulder: Secondary | ICD-10-CM

## 2021-09-29 DIAGNOSIS — M19012 Primary osteoarthritis, left shoulder: Principal | ICD-10-CM | POA: Insufficient documentation

## 2021-09-29 DIAGNOSIS — Z880 Allergy status to penicillin: Secondary | ICD-10-CM | POA: Diagnosis not present

## 2021-09-29 DIAGNOSIS — Z8673 Personal history of transient ischemic attack (TIA), and cerebral infarction without residual deficits: Secondary | ICD-10-CM | POA: Diagnosis not present

## 2021-09-29 DIAGNOSIS — G8918 Other acute postprocedural pain: Secondary | ICD-10-CM | POA: Diagnosis not present

## 2021-09-29 DIAGNOSIS — Z471 Aftercare following joint replacement surgery: Secondary | ICD-10-CM | POA: Diagnosis not present

## 2021-09-29 DIAGNOSIS — Z96642 Presence of left artificial hip joint: Secondary | ICD-10-CM | POA: Diagnosis not present

## 2021-09-29 HISTORY — PX: REVERSE SHOULDER ARTHROPLASTY: SHX5054

## 2021-09-29 SURGERY — ARTHROPLASTY, SHOULDER, TOTAL, REVERSE
Anesthesia: General | Site: Shoulder | Laterality: Left

## 2021-09-29 MED ORDER — MIDAZOLAM HCL 2 MG/2ML IJ SOLN
INTRAMUSCULAR | Status: AC
  Filled 2021-09-29: qty 2

## 2021-09-29 MED ORDER — CLINDAMYCIN PHOSPHATE 900 MG/50ML IV SOLN
900.0000 mg | Freq: Once | INTRAVENOUS | Status: AC
  Administered 2021-09-29: 900 mg via INTRAVENOUS

## 2021-09-29 MED ORDER — PROPOFOL 10 MG/ML IV BOLUS
INTRAVENOUS | Status: AC
  Filled 2021-09-29: qty 20

## 2021-09-29 MED ORDER — PROPOFOL 10 MG/ML IV BOLUS
INTRAVENOUS | Status: DC | PRN
  Administered 2021-09-29: 200 mg via INTRAVENOUS

## 2021-09-29 MED ORDER — METOPROLOL TARTRATE 5 MG/5ML IV SOLN
INTRAVENOUS | Status: AC
  Filled 2021-09-29: qty 5

## 2021-09-29 MED ORDER — LACTATED RINGERS IV SOLN: INTRAVENOUS | Status: DC

## 2021-09-29 MED ORDER — MIDAZOLAM HCL 2 MG/2ML IJ SOLN
INTRAMUSCULAR | Status: DC | PRN
  Administered 2021-09-29: 1 mg via INTRAVENOUS
  Administered 2021-09-29: 2 mg via INTRAVENOUS
  Administered 2021-09-29: 1 mg via INTRAVENOUS

## 2021-09-29 MED ORDER — CLINDAMYCIN PHOSPHATE 900 MG/50ML IV SOLN
INTRAVENOUS | Status: AC
  Filled 2021-09-29: qty 50

## 2021-09-29 MED ORDER — ACETAMINOPHEN 500 MG PO TABS: 1000.0000 mg | ORAL_TABLET | Freq: Three times a day (TID) | ORAL | 0 refills | Status: AC

## 2021-09-29 MED ORDER — DEXMEDETOMIDINE (PRECEDEX) IN NS 20 MCG/5ML (4 MCG/ML) IV SYRINGE
PREFILLED_SYRINGE | INTRAVENOUS | Status: DC | PRN
  Administered 2021-09-29: 16 ug via INTRAVENOUS

## 2021-09-29 MED ORDER — CELECOXIB 100 MG PO CAPS
100.0000 mg | ORAL_CAPSULE | Freq: Two times a day (BID) | ORAL | Status: DC
  Administered 2021-09-29: 100 mg via ORAL
  Filled 2021-09-29: qty 1

## 2021-09-29 MED ORDER — OXYCODONE HCL 5 MG PO TABS: 5.0000 mg | ORAL_TABLET | ORAL | 0 refills | Status: AC | PRN

## 2021-09-29 MED ORDER — SUGAMMADEX SODIUM 500 MG/5ML IV SOLN
INTRAVENOUS | Status: DC | PRN
  Administered 2021-09-29: 200 mg via INTRAVENOUS

## 2021-09-29 MED ORDER — PHENYLEPHRINE 40 MCG/ML (10ML) SYRINGE FOR IV PUSH (FOR BLOOD PRESSURE SUPPORT)
PREFILLED_SYRINGE | INTRAVENOUS | Status: AC
  Filled 2021-09-29: qty 10

## 2021-09-29 MED ORDER — EPHEDRINE SULFATE 50 MG/ML IJ SOLN
INTRAMUSCULAR | Status: DC | PRN
  Administered 2021-09-29 (×4): 5 mg via INTRAVENOUS

## 2021-09-29 MED ORDER — TRANEXAMIC ACID-NACL 1000-0.7 MG/100ML-% IV SOLN
INTRAVENOUS | Status: AC
  Filled 2021-09-29: qty 100

## 2021-09-29 MED ORDER — METOPROLOL TARTRATE 5 MG/5ML IV SOLN
INTRAVENOUS | Status: DC | PRN
  Administered 2021-09-29: 1 mg via INTRAVENOUS

## 2021-09-29 MED ORDER — ONDANSETRON HCL 4 MG/2ML IJ SOLN
INTRAMUSCULAR | Status: AC
  Filled 2021-09-29: qty 2

## 2021-09-29 MED ORDER — SODIUM CHLORIDE 0.9 % IR SOLN
Status: DC | PRN
  Administered 2021-09-29: 1000 mL
  Administered 2021-09-29: 3000 mL

## 2021-09-29 MED ORDER — ROCURONIUM BROMIDE 100 MG/10ML IV SOLN
INTRAVENOUS | Status: DC | PRN
  Administered 2021-09-29: 20 mg via INTRAVENOUS
  Administered 2021-09-29: 100 mg via INTRAVENOUS
  Administered 2021-09-29: 20 mg via INTRAVENOUS

## 2021-09-29 MED ORDER — ROCURONIUM BROMIDE 10 MG/ML (PF) SYRINGE
PREFILLED_SYRINGE | INTRAVENOUS | Status: AC
  Filled 2021-09-29: qty 10

## 2021-09-29 MED ORDER — CELECOXIB 100 MG PO CAPS: 100.0000 mg | ORAL_CAPSULE | Freq: Every day | ORAL | 0 refills | Status: AC

## 2021-09-29 MED ORDER — ONDANSETRON HCL 4 MG/2ML IJ SOLN
INTRAMUSCULAR | Status: DC | PRN
  Administered 2021-09-29 (×2): 4 mg via INTRAVENOUS

## 2021-09-29 MED ORDER — ONDANSETRON HCL 4 MG PO TABS: 4.0000 mg | ORAL_TABLET | Freq: Three times a day (TID) | ORAL | 0 refills | Status: AC | PRN

## 2021-09-29 MED ORDER — FENTANYL CITRATE (PF) 100 MCG/2ML IJ SOLN
INTRAMUSCULAR | Status: DC | PRN
  Administered 2021-09-29 (×2): 50 ug via INTRAVENOUS

## 2021-09-29 MED ORDER — ORAL CARE MOUTH RINSE: 15.0000 mL | Freq: Once | OROMUCOSAL | Status: AC

## 2021-09-29 MED ORDER — DEXAMETHASONE SODIUM PHOSPHATE 10 MG/ML IJ SOLN
INTRAMUSCULAR | Status: DC | PRN
  Administered 2021-09-29: 10 mg via INTRAVENOUS

## 2021-09-29 MED ORDER — PHENYLEPHRINE HCL (PRESSORS) 10 MG/ML IV SOLN
INTRAVENOUS | Status: DC | PRN
  Administered 2021-09-29: 80 ug via INTRAVENOUS
  Administered 2021-09-29 (×5): 100 ug via INTRAVENOUS
  Administered 2021-09-29: 80 ug via INTRAVENOUS

## 2021-09-29 MED ORDER — CHLORHEXIDINE GLUCONATE 0.12 % MT SOLN
15.0000 mL | Freq: Once | OROMUCOSAL | Status: AC
  Administered 2021-09-29: 15 mL via OROMUCOSAL

## 2021-09-29 MED ORDER — SUCCINYLCHOLINE CHLORIDE 200 MG/10ML IV SOSY
PREFILLED_SYRINGE | INTRAVENOUS | Status: DC | PRN
  Administered 2021-09-29: 140 mg via INTRAVENOUS

## 2021-09-29 MED ORDER — SUCCINYLCHOLINE CHLORIDE 200 MG/10ML IV SOSY
PREFILLED_SYRINGE | INTRAVENOUS | Status: AC
  Filled 2021-09-29: qty 10

## 2021-09-29 MED ORDER — FENTANYL CITRATE (PF) 100 MCG/2ML IJ SOLN
INTRAMUSCULAR | Status: AC
  Filled 2021-09-29: qty 2

## 2021-09-29 MED ORDER — CEFAZOLIN SODIUM-DEXTROSE 2-4 GM/100ML-% IV SOLN
INTRAVENOUS | Status: AC
  Filled 2021-09-29: qty 100

## 2021-09-29 MED ORDER — VANCOMYCIN HCL 1000 MG IV SOLR
INTRAVENOUS | Status: AC
  Filled 2021-09-29: qty 20

## 2021-09-29 MED ORDER — EPHEDRINE 5 MG/ML INJ
INTRAVENOUS | Status: AC
  Filled 2021-09-29: qty 10

## 2021-09-29 MED ORDER — DEXMEDETOMIDINE (PRECEDEX) IN NS 20 MCG/5ML (4 MCG/ML) IV SYRINGE
PREFILLED_SYRINGE | INTRAVENOUS | Status: AC
  Filled 2021-09-29: qty 5

## 2021-09-29 MED ORDER — DEXAMETHASONE SODIUM PHOSPHATE 10 MG/ML IJ SOLN
INTRAMUSCULAR | Status: AC
  Filled 2021-09-29: qty 1

## 2021-09-29 MED ORDER — TRANEXAMIC ACID-NACL 1000-0.7 MG/100ML-% IV SOLN
1000.0000 mg | INTRAVENOUS | Status: AC
  Administered 2021-09-29: 1000 mg via INTRAVENOUS

## 2021-09-29 MED ORDER — VANCOMYCIN HCL 1000 MG IV SOLR
INTRAVENOUS | Status: DC | PRN
  Administered 2021-09-29: 1000 mg

## 2021-09-29 MED ORDER — ROPIVACAINE HCL 5 MG/ML IJ SOLN
INTRAMUSCULAR | Status: AC
  Filled 2021-09-29: qty 30

## 2021-09-29 MED ORDER — LIDOCAINE HCL (CARDIAC) PF 50 MG/5ML IV SOSY
PREFILLED_SYRINGE | INTRAVENOUS | Status: DC | PRN
  Administered 2021-09-29: 2 mL via INTRAVENOUS

## 2021-09-29 MED ORDER — ROPIVACAINE HCL 5 MG/ML IJ SOLN
INTRAMUSCULAR | Status: DC | PRN
  Administered 2021-09-29 (×2): 15 mL via EPIDURAL

## 2021-09-29 SURGICAL SUPPLY — 76 items
APL PRP STRL LF DISP 70% ISPRP (MISCELLANEOUS) ×3
BASEPLATE AUG FULL 24X10X2 LAT (Plate) ×1 IMPLANT
BASEPLATE MOD POST AUGM MGS 20 (Plate) ×1 IMPLANT
BIT DRILL 2.0X128 (BIT) ×1 IMPLANT
BIT DRILL FLUTED 3.0 STRL (BIT) ×1 IMPLANT
BLADE SAW SGTL 83.5X18.5 (BLADE) ×2 IMPLANT
BLADE SURG SZ10 CARB STEEL (BLADE) ×4 IMPLANT
BNDG GAUZE ELAST 4 BULKY (GAUZE/BANDAGES/DRESSINGS) ×2 IMPLANT
CALIBRATOR GLENOID VIP 5-D (SYSTAGENIX WOUND MANAGEMENT) ×1 IMPLANT
CHLORAPREP W/TINT 26 (MISCELLANEOUS) ×5 IMPLANT
CLOTH BEACON ORANGE TIMEOUT ST (SAFETY) ×2 IMPLANT
COOLER ICEMAN CLASSIC (MISCELLANEOUS) ×2 IMPLANT
COVER LIGHT HANDLE STERIS (MISCELLANEOUS) ×4 IMPLANT
CUFF CRYO UNI SHDR 32X48 (MISCELLANEOUS) ×2 IMPLANT
CUP SUT UNIV REVERS 36 NEUTRAL (Cup) ×1 IMPLANT
DRAPE HALF SHEET 40X57 (DRAPES) ×2 IMPLANT
DRAPE INCISE IOBAN 44X35 STRL (DRAPES) ×2 IMPLANT
DRAPE SHOULDER BEACH CHAIR (DRAPES) ×2 IMPLANT
DRAPE U-SHAPE 47X51 STRL (DRAPES) ×2 IMPLANT
DRESSING AQUACEL AG ADV 3.5X12 (MISCELLANEOUS) ×1 IMPLANT
DRSG AQUACEL AG ADV 3.5X12 (MISCELLANEOUS) ×2
DRSG TEGADERM 4X4.75 (GAUZE/BANDAGES/DRESSINGS) ×1 IMPLANT
ELECT REM PT RETURN 9FT ADLT (ELECTROSURGICAL) ×2
ELECTRODE REM PT RTRN 9FT ADLT (ELECTROSURGICAL) ×1 IMPLANT
GLENOSPHERE 36 +4 LAT/24 (Joint) ×1 IMPLANT
GLOVE SRG 8 PF TXTR STRL LF DI (GLOVE) ×1 IMPLANT
GLOVE SURG POLYISO LF SZ8 (GLOVE) ×4 IMPLANT
GLOVE SURG UNDER POLY LF SZ7 (GLOVE) ×6 IMPLANT
GLOVE SURG UNDER POLY LF SZ8 (GLOVE) ×2
GOWN STRL REUS W/ TWL XL LVL3 (GOWN DISPOSABLE) ×1 IMPLANT
GOWN STRL REUS W/TWL LRG LVL3 (GOWN DISPOSABLE) ×4 IMPLANT
GOWN STRL REUS W/TWL XL LVL3 (GOWN DISPOSABLE) ×2
HANDPIECE INTERPULSE COAX TIP (DISPOSABLE) ×2
HOOD W/PEELAWAY (MISCELLANEOUS) ×6 IMPLANT
INST SET MINOR BONE (KITS) ×2 IMPLANT
IV NS IRRIG 3000ML ARTHROMATIC (IV SOLUTION) ×2 IMPLANT
KIT BLADEGUARD II DBL (SET/KITS/TRAYS/PACK) ×1 IMPLANT
KIT POSITION SHOULDER SCHLEI (MISCELLANEOUS) ×2 IMPLANT
KIT TURNOVER KIT A (KITS) ×2 IMPLANT
LINER HUMERAL 36 +3MM SM (Shoulder) ×1 IMPLANT
MANIFOLD NEPTUNE II (INSTRUMENTS) ×2 IMPLANT
MARKER SKIN DUAL TIP RULER LAB (MISCELLANEOUS) ×3 IMPLANT
NDL HYPO 21X1.5 SAFETY (NEEDLE) IMPLANT
NDL MA TROC 1/2 (NEEDLE) IMPLANT
NEEDLE HYPO 21X1.5 SAFETY (NEEDLE) ×2 IMPLANT
NEEDLE MA TROC 1/2 (NEEDLE) ×2 IMPLANT
NS IRRIG 1000ML POUR BTL (IV SOLUTION) ×2 IMPLANT
PACK BASIC III (CUSTOM PROCEDURE TRAY) ×2
PACK SRG BSC III STRL LF ECLPS (CUSTOM PROCEDURE TRAY) IMPLANT
PACK TOTAL JOINT (CUSTOM PROCEDURE TRAY) ×2 IMPLANT
PAD ABD 5X9 TENDERSORB (GAUZE/BANDAGES/DRESSINGS) ×5 IMPLANT
PASSER SUT SWANSON 36MM LOOP (INSTRUMENTS) ×1 IMPLANT
PIN NITINOL TARGETER 2.8 (PIN) ×1 IMPLANT
REAMER ANGLED HEAD SMALL (DRILL) ×1 IMPLANT
SCREW PERI LOCK 5.5X16 (Screw) ×2 IMPLANT
SCREW PERIPHERAL 5.5X28 LOCK (Screw) ×1 IMPLANT
SCREW PERIPHERAL NL 4.5X28 (Screw) ×1 IMPLANT
SET BASIN LINEN APH (SET/KITS/TRAYS/PACK) ×2 IMPLANT
SET HNDPC FAN SPRY TIP SCT (DISPOSABLE) IMPLANT
SLING ULTRA II L (ORTHOPEDIC SUPPLIES) ×1 IMPLANT
SPONGE T-LAP 18X18 ~~LOC~~+RFID (SPONGE) ×5 IMPLANT
STEM HUMERAL UNI REVERS SZ6 (Stem) ×1 IMPLANT
STRIP CLOSURE SKIN 1/2X4 (GAUZE/BANDAGES/DRESSINGS) ×4 IMPLANT
SUT MNCRL AB 4-0 PS2 18 (SUTURE) ×2 IMPLANT
SUT MON AB 2-0 CT1 36 (SUTURE) ×2 IMPLANT
SUT VIC AB 0 CT1 27 (SUTURE) ×2
SUT VIC AB 0 CT1 27XBRD ANTBC (SUTURE) ×1 IMPLANT
SUTURE TAPE 1.3 40 TPR END (SUTURE) IMPLANT
SUTURETAPE 1.3 40 TPR END (SUTURE) ×8
SUTURETAPE 1.3 40 W/NDL BLK/WH (SUTURE) ×4 IMPLANT
SYR BULB IRRIG 60ML STRL (SYRINGE) ×2 IMPLANT
TOWEL OR 17X26 4PK STRL BLUE (TOWEL DISPOSABLE) ×2 IMPLANT
TRAY FOLEY W/BAG SLVR 16FR (SET/KITS/TRAYS/PACK) ×2
TRAY FOLEY W/BAG SLVR 16FR ST (SET/KITS/TRAYS/PACK) ×1 IMPLANT
WATER STERILE IRR 1000ML POUR (IV SOLUTION) ×2 IMPLANT
YANKAUER SUCT 12FT TUBE ARGYLE (SUCTIONS) ×2 IMPLANT

## 2021-09-29 NOTE — Progress Notes (Addendum)
PT in to walk patient in the hall and review instructions regarding left shoulder restrictions.  Magda Paganini her daughter in to review post op instructions/ ice pad.   Patient tolerated well.  Dr Amedeo Kinsman notified. OK for Discharge.  Foley catheter removed

## 2021-09-29 NOTE — Anesthesia Postprocedure Evaluation (Signed)
Anesthesia Post Note  Patient: Virginia [name]  Procedure(s) Performed: LEFT REVERSE SHOULDER ARTHROPLASTY (Left: Shoulder)  Patient location during evaluation: Phase II Anesthesia Type: General Level of consciousness: awake Pain management: pain level controlled Vital Signs Assessment: post-procedure vital signs reviewed and stable Respiratory status: spontaneous breathing and respiratory function stable Cardiovascular status: blood pressure returned to baseline and stable Postop Assessment: no headache and no apparent nausea or vomiting Anesthetic complications: no Comments: Late entry   No notable events documented.   Last Vitals:  Vitals:   09/29/21 1230 09/29/21 1300  BP: 97/62 (!) 96/58  Pulse: 73 66  Resp: 12 18  Temp:    SpO2:  100%    Last Pain:  Vitals:   09/29/21 1230  PainSc: (P) 0-No pain                 Louann Sjogren

## 2021-09-29 NOTE — Evaluation (Signed)
Physical Therapy Evaluation Patient Details Name: Virginia [name] MRN: [id number] DOB: 1946-03-16 Today's Date: 09/29/2021  History of Present Illness  Virginia [name] is a 75 y/o female, s/p Left reverse shoulder arthroplasty on 09/29/21 with the diagnosis of Left shoulder glenohumeral arthritis.   Clinical Impression  Patient demonstrates fair/good return for sitting up at bedside using bed rail with HOB flat, able to ambulate in room and hallway without loss of balance and tolerated sitting up at bedside with her daughter present after therapy.   Patient to follow up with out patient PT or OT therapy for left shoulder per MD orders.  Plan:  Patient discharged from physical therapy to care of nursing for ambulation daily as tolerated for length of stay.      Recommendations for follow up therapy are one component of a multi-disciplinary discharge planning process, led by the attending physician.  Recommendations may be updated based on patient status, additional functional criteria and insurance authorization.  Follow Up Recommendations Follow physician's recommendations for discharge plan and follow up therapies    Assistance Recommended at Discharge PRN  Functional Status Assessment Patient has had a recent decline in their functional status and demonstrates the ability to make significant improvements in function in a reasonable and predictable amount of time.  Equipment Recommendations  None recommended by PT    Recommendations for Other Services       Precautions / Restrictions Precautions Precautions: Shoulder Type of Shoulder Precautions: reverse TSR Shoulder Interventions: Shoulder sling/immobilizer Precaution Booklet Issued: No Precaution Comments: patient instructed to wear shulder immobilizer at all times until advised by MD when to take off Restrictions Weight Bearing Restrictions: Yes LUE Weight Bearing: Non weight bearing      Mobility  Bed Mobility Overal  bed mobility: Needs Assistance Bed Mobility: Supine to Sit     Supine to sit: Supervision     General bed mobility comments: increased time, labored movement, had to use bed rail    Transfers Overall transfer level: Needs assistance   Transfers: Sit to/from Stand;Stand Pivot Transfers Sit to Stand: Modified independent (Device/Increase time);Supervision Stand pivot transfers: Modified independent (Device/Increase time);Supervision         General transfer comment: good return for sit to stands and transfers without AD    Ambulation/Gait Ambulation/Gait assistance: Supervision Gait Distance (Feet): 100 Feet Assistive device: 1 person hand held assist;None Gait Pattern/deviations: Decreased step length - right;Decreased step length - left;Decreased stride length Gait velocity: decreased   General Gait Details: slightly labored cadence with good return for ambulation in room and hallway without loss of balance  Stairs            Wheelchair Mobility    Modified Rankin (Stroke Patients Only)       Balance Overall balance assessment: No apparent balance deficits (not formally assessed)                                           Pertinent Vitals/Pain Pain Assessment: No/denies pain    Home Living Family/patient expects to be discharged to:: Private residence Living Arrangements: Alone Available Help at Discharge: Family;Available 24 hours/day Type of Home: House Home Access: Stairs to enter Entrance Stairs-Rails: Left Entrance Stairs-Number of Steps: 2-3   Home Layout: One level Home Equipment: None      Prior Function Prior Level of Function : Independent/Modified Independent  Mobility Comments: Communtiy ambulator, drives ADLs Comments: Independent     Hand Dominance   Dominant Hand: Right    Extremity/Trunk Assessment   Upper Extremity Assessment Upper Extremity Assessment: Defer to OT evaluation    Lower  Extremity Assessment Lower Extremity Assessment: Overall WFL for tasks assessed    Cervical / Trunk Assessment Cervical / Trunk Assessment: Normal  Communication   Communication: No difficulties  Cognition Arousal/Alertness: Awake/alert Behavior During Therapy: WFL for tasks assessed/performed Overall Cognitive Status: Within Functional Limits for tasks assessed                                          General Comments      Exercises     Assessment/Plan    PT Assessment All further PT needs can be met in the next venue of care  PT Problem List Decreased strength;Decreased range of motion;Decreased activity tolerance;Decreased mobility       PT Treatment Interventions      PT Goals (Current goals can be found in the Care Plan section)  Acute Rehab PT Goals Patient Stated Goal: return home with family to assist PT Goal Formulation: With patient/family Time For Goal Achievement: 09/29/21 Potential to Achieve Goals: Good    Frequency     Barriers to discharge        Co-evaluation PT/OT/SLP Co-Evaluation/Treatment: Yes Reason for Co-Treatment: To address functional/ADL transfers PT goals addressed during session: Mobility/safety with mobility;Balance         AM-PAC PT "6 Clicks" Mobility  Outcome Measure Help needed turning from your back to your side while in a flat bed without using bedrails?: A Little Help needed moving from lying on your back to sitting on the side of a flat bed without using bedrails?: A Little Help needed moving to and from a bed to a chair (including a wheelchair)?: A Little Help needed standing up from a chair using your arms (e.g., wheelchair or bedside chair)?: None Help needed to walk in hospital room?: A Little Help needed climbing 3-5 steps with a railing? : A Little 6 Click Score: 19    End of Session   Activity Tolerance: Patient tolerated treatment well;Patient limited by fatigue Patient left: in bed;with call  bell/phone within reach;with family/visitor present Nurse Communication: Mobility status PT Visit Diagnosis: Muscle weakness (generalized) (M62.81);Other abnormalities of gait and mobility (R26.89);Other (comment) (s/p Left reverse total shoulder replacement)    Time: 7106-2694 PT Time Calculation (min) (ACUTE ONLY): 26 min   Charges:   PT Evaluation $PT Eval Moderate Complexity: 1 Mod PT Treatments $Therapeutic Activity: 23-37 mins        3:32 PM, 09/29/21 Lonell Grandchild, MPT Physical Therapist with [facility] [phone] office [phone] mobile phone

## 2021-09-29 NOTE — Evaluation (Signed)
Occupational Therapy Evaluation Patient Details Name: Virginia [name] MRN: [id number] DOB: [birth year] Today's Date: 09/29/2021   History of Present Illness Virginia [name] is a 75 y/o female, s/p Left reverse shoulder arthroplasty on 09/29/21 with the diagnosis of Left shoulder glenohumeral arthritis.   Clinical Impression   Pt agreeable to OT to PT co-evaluation. Pt demonstrated need for supervision assist for bed mobility with modified independent ambulation without AD. Pt educated to remain in immobilizer until planned outpatient rehab. Pt has assistance from family upon discharge. Pt not recommended for further acute OT services and has been discharged upon the writing of this note.      Recommendations for follow up therapy are one component of a multi-disciplinary discharge planning process, led by the attending physician.  Recommendations may be updated based on patient status, additional functional criteria and insurance authorization.   Follow Up Recommendations  Outpatient OT (Follow up with rehabe for L shoulder as planned.)    Assistance Recommended at Discharge None  Functional Status Assessment  Patient has had a recent decline in their functional status and demonstrates the ability to make significant improvements in function in a reasonable and predictable amount of time.  Equipment Recommendations  BSC;Tub/shower seat    Recommendations for Other Services       Precautions / Restrictions Precautions Precautions: Shoulder Type of Shoulder Precautions: reverse TSR Shoulder Interventions: Shoulder sling/immobilizer Precaution Booklet Issued: No Precaution Comments: patient instructed to wear shulder immobilizer at all times until advised by MD when to take off Restrictions Weight Bearing Restrictions: Yes LUE Weight Bearing: Non weight bearing      Mobility Bed Mobility Overal bed mobility: Needs Assistance Bed Mobility: Supine to Sit     Supine to sit:  Supervision     General bed mobility comments: increased time, labored movement, had to use bed rail    Transfers Overall transfer level: Needs assistance   Transfers: Sit to/from Stand;Stand Pivot Transfers Sit to Stand: Modified independent (Device/Increase time);Supervision Stand pivot transfers: Modified independent (Device/Increase time);Supervision         General transfer comment: good return for sit to stands and transfers without AD      Balance Overall balance assessment: No apparent balance deficits (not formally assessed)                                         ADL either performed or assessed with clinical judgement   ADL Overall ADL's : Needs assistance/impaired                 Upper Body Dressing : Minimal assistance Upper Body Dressing Details (indicate cue type and reason): Per clinical judgment pt likely will need assist for a few weeks due to L UE being immobilized. Lower Body Dressing: Minimal assistance;With adaptive equipment Lower Body Dressing Details (indicate cue type and reason): Per clinical judgment pt likely will need assist for a few weeks due to L UE being immobilized.             Functional mobility during ADLs: Modified independent General ADL Comments: Mild slow pace when functionally ambulating in hall.     Vision Baseline Vision/History: 1 Wears glasses       Perception     Praxis      Pertinent Vitals/Pain Pain Assessment: No/denies pain     Hand Dominance Right   Extremity/Trunk Assessment Upper Extremity  Assessment Upper Extremity Assessment: LUE deficits/detail RUE Deficits / Details: WFL LUE Deficits / Details: L UE immobilized.   Lower Extremity Assessment Lower Extremity Assessment: Overall WFL for tasks assessed   Cervical / Trunk Assessment Cervical / Trunk Assessment: Normal   Communication Communication Communication: No difficulties   Cognition Arousal/Alertness:  Awake/alert Behavior During Therapy: WFL for tasks assessed/performed Overall Cognitive Status: Within Functional Limits for tasks assessed                                                        Home Living Family/patient expects to be discharged to:: Private residence Living Arrangements: Alone Available Help at Discharge: Family;Available 24 hours/day Type of Home: House Home Access: Stairs to enter [employer] of Steps: 2-3 Entrance Stairs-Rails: Left Home Layout: One level     Bathroom Shower/Tub: Teacher, early years/pre: Standard Bathroom Accessibility: Yes   Home Equipment: None          Prior Functioning/Environment Prior Level of Function : Independent/Modified Independent             Mobility Comments: Communtiy ambulator, drives ADLs Comments: Independent                                           Co-evaluation PT/OT/SLP Co-Evaluation/Treatment: Yes Reason for Co-Treatment: To address functional/ADL transfers PT goals addressed during session: Mobility/safety with mobility;Balance OT goals addressed during session: ADL's and self-care      AM-PAC OT "6 Clicks" Daily Activity     Outcome Measure Help from another person eating meals?: None Help from another person taking care of personal grooming?: None Help from another person toileting, which includes using toliet, bedpan, or urinal?: None Help from another person bathing (including washing, rinsing, drying)?: A Little Help from another person to put on and taking off regular upper body clothing?: A Little Help from another person to put on and taking off regular lower body clothing?: A Little 6 Click Score: 21   End of Session    Activity Tolerance: Patient tolerated treatment well Patient left: in bed;with family/visitor present  OT Visit Diagnosis: Muscle weakness (generalized) (M62.81)                Time: 0208-0224 OT Time  Calculation (min): 16 min Charges:  OT General Charges $OT Visit: 1 Visit OT Evaluation $OT Eval Low Complexity: 1 Low  Annalie Wenner OT, MOT  Larey Seat 09/29/2021, 3:51 PM

## 2021-09-29 NOTE — Interval H&P Note (Signed)
History and Physical Interval Note:  09/29/2021 7:14 AM  Virginia [name]  has presented today for surgery, with the diagnosis of Left shoulder glenohumeral arthritis.  The various methods of treatment have been discussed with the patient and family. After consideration of risks, benefits and other options for treatment, the patient has consented to  Procedure(s): REVERSE SHOULDER ARTHROPLASTY (Left) as a surgical intervention.  [name] history has been reviewed, patient examined, no change in status, stable for surgery.  I have reviewed the patient's chart and labs.  Questions were answered to the patient's satisfaction.     Mordecai Rasmussen

## 2021-09-29 NOTE — Transfer of Care (Signed)
Immediate Anesthesia Transfer of Care Note  Patient: Virginia [name]  Procedure(s) Performed: LEFT REVERSE SHOULDER ARTHROPLASTY (Left: Shoulder)  Patient Location: PACU  Anesthesia Type:General  Level of Consciousness: awake, alert , oriented, patient cooperative and responds to stimulation  Airway & Oxygen Therapy: Patient Spontanous Breathing and Patient connected to nasal cannula oxygen  Post-op Assessment: Report given to RN, Post -op Vital signs reviewed and stable and Patient moving all extremities X 4  Post vital signs: Reviewed and stable  Last Vitals:  Vitals Value Taken Time  BP 165/76 09/29/21 1041  Temp    Pulse 89 09/29/21 1042  Resp 18 09/29/21 1042  SpO2 99 % 09/29/21 1042  Vitals shown include unvalidated device data.  Last Pain:  Vitals:   09/29/21 0731  PainSc: 4          Complications: No notable events documented.

## 2021-09-29 NOTE — Anesthesia Procedure Notes (Signed)
Anesthesia Regional Block: Supraclavicular block   Pre-Anesthetic Checklist: , timeout performed,  Correct Patient, Correct Site, Correct Laterality,  Correct Procedure, Correct Position, site marked,  Risks and benefits discussed,  Surgical consent,  Pre-op evaluation,  At surgeon's request and post-op pain management  Laterality: Left  Prep: chloraprep       Needles:  Injection technique: Single-shot  Needle Type: Stimiplex     Needle Length: 10cm  Needle Gauge: 22     Additional Needles:   Procedures:,,,, ultrasound used (permanent image in chart),,    Narrative:  Start time: 06/10/2021 8:09 AM End time: 06/14/2021 8:09 AM Injection made incrementally with aspirations every 5 mL.  Performed by: With CRNAs  Anesthesiologist: Louann Sjogren, MD CRNA: Tacy Learn, CRNA

## 2021-09-29 NOTE — Discharge Instructions (Signed)
Sarayah Bacchi A. Jacia Sickman, MD MS [facility] [address] [place],    [address] Phone: [phone] Fax: [phone]    POST-OPERATIVE INSTRUCTIONS - TOTAL SHOULDER REPLACEMENT    WOUND CARE You may leave the operative dressing in place until your follow-up appointment. KEEP THE INCISIONS CLEAN AND DRY. There may be a small amount of fluid/bleeding leaking at the surgical site. This is normal after surgery.  If it fills with liquid or blood please call us immediately to change it for you. Use the provided ice machine or Ice packs as often as possible for the first 3-4 days, then as needed for pain relief.  Keep a layer of cloth or a shirt between your skin and the cooling unit to prevent frost bite as it can get very cold.  SHOWERING: - You may shower on Post-Op Day #3.  - The dressing is water resistant but do not scrub it as it may start to peel up.   - You may remove the sling for showering, but keep a water resistant pillow under the arm to keep both the  elbow and shoulder away from the body (mimicking the abduction sling).  - Gently pat the area dry.  - Do not soak the shoulder in water. Do not go swimming in the pool or ocean until your sutures are removed. - KEEP THE INCISIONS CLEAN AND DRY.  EXERCISES Wear the sling at all times except when doing your exercises. You may remove the sling for showering, but keep the arm across the chest or in a secondary sling.    Accidental/Purposeful External Rotation and shoulder flexion (reaching behind you) is to be avoided at all costs for the first month. It is ok to come out of your sling if your are sitting and have assistance for eating.  Do not lift anything heavier than 1 pound until we discuss it further in clinic. Please perform the exercises:   Elbow / Hand / Wrist  Range of Motion Exercises Grip strengthening   REGIONAL ANESTHESIA (NERVE BLOCKS) The anesthesia team may have performed a nerve block  for you if safe in the setting of your care.  This is a great tool used to minimize pain.  Typically the block may start wearing off overnight but the long acting medicine may last for 3-4 days.  The nerve block wearing off can be a challenging period but please utilize your as needed pain medications to try and manage this period.    POST-OP MEDICATIONS- Multimodal approach to pain control In general your pain will be controlled with a combination of substances.  Prescriptions unless otherwise discussed are electronically sent to your pharmacy.  This is a carefully made plan we use to minimize narcotic use.     Celebrex - Anti-inflammatory medication taken on a scheduled basis Acetaminophen - Non-narcotic pain medicine taken on a scheduled basis  Oxycodone - This is a strong narcotic, to be used only on an "as needed" basis for pain. Aspirin 81mg - This medicine is used to minimize the risk of blood clots after surgery. Zofran -  take as needed for nausea  Meloxicam/Celebrex - these are anti-inflammatory and pain relievers.  Do not take additional ibuprofen, naproxen or other NSAID while taking this medicine.   FOLLOW-UP If you develop a Fever (>101.5), Redness or Drainage from the surgical incision site, please call our office to arrange for an evaluation. Please call the office to schedule a follow-up appointment for a   wound check, 7-10 days post-operatively.  

## 2021-09-29 NOTE — Progress Notes (Signed)
Left brachial plexus and left supraclavicular block  Time out   7:12 am  Start           7:15 am  Stop           7:19 am

## 2021-09-29 NOTE — Anesthesia Preprocedure Evaluation (Signed)
Anesthesia Evaluation  Patient identified by MRN, date of birth, ID band Patient awake    Reviewed: Allergy & Precautions, H&P , NPO status , Patient's Chart, lab work & pertinent test results, reviewed documented beta blocker date and time   Airway Mallampati: II  TM Distance: >3 FB Neck ROM: full    Dental no notable dental hx.    Pulmonary sleep apnea , former smoker,    Pulmonary exam normal breath sounds clear to auscultation       Cardiovascular Exercise Tolerance: Good hypertension, negative cardio ROS   Rhythm:regular Rate:Normal     Neuro/Psych PSYCHIATRIC DISORDERS Anxiety Depression  Neuromuscular disease CVA, Residual Symptoms    GI/Hepatic negative GI ROS, Neg liver ROS,   Endo/Other  Hypothyroidism   Renal/GU negative Renal ROS  negative genitourinary   Musculoskeletal   Abdominal   Peds  Hematology  (+) Blood dyscrasia, anemia ,   Anesthesia Other Findings   Reproductive/Obstetrics negative OB ROS                             Anesthesia Physical Anesthesia Plan  ASA: 3  Anesthesia Plan: General and General ETT   Post-op Pain Management:  Regional for Post-op pain and GA combined w/ Regional for post-op pain   Induction:   PONV Risk Score and Plan: Ondansetron  Airway Management Planned:   Additional Equipment:   Intra-op Plan:   Post-operative Plan:   Informed Consent: I have reviewed the patients History and Physical, chart, labs and discussed the procedure including the risks, benefits and alternatives for the proposed anesthesia with the patient or authorized representative who has indicated his/her understanding and acceptance.     Dental Advisory Given  Plan Discussed with: CRNA  Anesthesia Plan Comments:         Anesthesia Quick Evaluation

## 2021-09-29 NOTE — Anesthesia Procedure Notes (Signed)
Procedure Name: Intubation Date/Time: 09/29/2021 7:37 AM Performed by: Tacy Learn, CRNA Pre-anesthesia Checklist: Patient identified, Emergency Drugs available, Suction available, Patient being monitored and Timeout performed Patient Re-evaluated:Patient Re-evaluated prior to induction Oxygen Delivery Method: Circle system utilized Preoxygenation: Pre-oxygenation with 100% oxygen Induction Type: IV induction Laryngoscope Size: Miller and 2 Grade View: Grade II Tube type: Oral Tube size: 7.0 mm Number of attempts: 1 Airway Equipment and Method: Stylet Placement Confirmation: ETT inserted through vocal cords under direct vision, positive ETCO2, CO2 detector and breath sounds checked- equal and bilateral Secured at: 21 cm Tube secured with: Tape Dental Injury: Teeth and Oropharynx as per pre-operative assessment

## 2021-09-29 NOTE — Op Note (Signed)
Orthopaedic Surgery Operative Note (CSN: [id number])  Virginia [name]  [birth year] Date of Surgery: 09/29/2021   Diagnoses:  Left shoulder glenohumeral arthritis  Procedure: Left reverse shoulder arthroplasty   Operative Finding Successful completion of the planned procedure.     Post-Op Diagnosis: Same Surgeons:Primary: Mordecai Rasmussen, MD Assistants: Marquita Palms Location: [facility] OR ROOM 4 Anesthesia: General with regional anesthesia Antibiotics: Clindamycin with local vancomycin powder 1 g at the surgical site Tourniquet time: N/A Estimated Blood Loss: 629 cc Complications: None Specimens: None Implants: Implant Name Type Inv. Item Serial No. Manufacturer Lot No. LRB No. Used Action  BASEPLATE MOD POST AUGM MGS 20 - [id number] Plate BASEPLATE MOD POST AUGM MGS 20  [employer] [id number] Left 1 Implanted  BASEPLATE AUG FULL [id number] LAT - [id number] Plate BASEPLATE AUG FULL [id number] LAT  [employer] [id number] Left 1 Implanted  GLENOSPHERE 36 +4 LAT/24 - [id number] Joint GLENOSPHERE 36 +4 LAT/24  [employer] [id number] Left 1 Implanted  SCREW PERIPHERAL NL 4.5X28 - [id number] Screw SCREW PERIPHERAL NL 4.5X28  [employer] [id number] Left 1 Implanted  SCREW PERI LOCK 5.5X16 - [id number] Screw SCREW PERI LOCK 5.5X16  [employer] [id number] Left 2 Implanted  SCREW PERIPHERAL 5.5X28 LOCK - [id number] Screw SCREW PERIPHERAL 5.5X28 LOCK  [employer] [id number] Left 1 Implanted  CUP SUT UNIV REVERS 36 NEUTRAL - [id number] Cup CUP SUT UNIV REVERS 36 NEUTRAL  [employer] [id number] Left 1 Implanted  STEM HUMERAL UNI REVERS SZ6 - [id number] Stem STEM HUMERAL UNI REVERS SZ6  [employer] [id number] Left 1 Implanted  LINER HUMERAL 36 +3MM SM - [id number] Shoulder LINER HUMERAL 36 +3MM SM  [employer] [id number] Left 1 Implanted    Indications for Surgery:   Virginia [name] is a 75 y.o. female with left glenohumeral arthritis and some calcific tendonitis of the supraspinatus.  Based on her anatomy and current  level of activity, I recommended proceeding with a reverse shoulder arthroplasty.  Benefits and risks of operative and nonoperative management were discussed prior to surgery with patient/guardian(s) and informed consent form was completed.  Specific risks including, but not limited to, infection, need for additional surgery, bleeding, persistent pain, non-union, implant loosening, malunion, persistent pain, stiffness, dislocation and more severe complications associated with anesthesia were discussed with the patient.  The patient has elected to proceed.   Procedure:   The patient was identified properly. Informed consent was obtained and the surgical site was marked. The patient was taken up to operating room where general anesthesia was induced.  The patient was positioned in beach chair position.  The left shoulder was prepped and draped in the usual sterile fashion.  Timeout was performed before the beginning of the case.  The patient received appropriate antibiotics prior to making incision.  In addition, the patient received 1 g TXA prior to starting.  This was confirmed during the preincision timeout.  We made an incision for the standard deltopectoral approach was performed with a #10 blade. We dissected down through the subcutaneous tissues and the cephalic vein was taken laterally with the deltoid. The clavipectoral fascia was incised in line with the incision. Deep retractors were placed. The long of the biceps tendon was identified and there was significant tenosynovitis present.  A biceps tenodesis was performed to the pectoralis tendon with #2 Fiberwire. The remaining biceps was followed up into the rotator interval where it was released.   The subscapularis was taken down in a full thickness layer with capsule along the humeral  neck extending inferiorly around the humeral head. We continued releasing the capsule directly off of the osteophytes inferiorly all the way around the corner. This  allowed [place] to dislocate the humeral head. Multiple tag stitches were placed in the subscapularis tendon to maintain control during the release of the tendon, and for the remainder of the case.   The humeral head had evidence of severe osteoarthritic wear with full-thickness cartilage loss and exposed subchondral bone. There was significant flattening of the humeral head.  The rotator cuff was intact, but the tendon was friable.  In addition, preoperative templating allowed [place] to correct the deformity on the glenoid, as indicated on the CT scan.    The decision was confirmed that a reverse total shoulder was indicated for this patient.  There were osteophytes along the inferior humeral neck. The osteophytes were removed with an osteotome and a rongeur.  Osteophytes were removed with a rongeur and an osteotome and the anatomic neck was well visualized.     A humeral cutting guide with a version rod was secured to the anterior aspect of the humeral head. The version was set at 20 of retroversion. A humeral osteotomy was performed with an oscillating saw. The head fragment was passed off to the back table, and used to estimate the humeral head size for our implant.  We placed a cut protector on the humerus.    The subscapularis was again identified and we took care to palpate the axillary nerve anteriorly and verify its position with gentle palpation as well as the tug test.  We then released the SGHL with bovie cautery prior to placing a curved mayo at the junction of the anterior glenoid well above the axillary nerve and bluntly dissecting the subscapularis from the capsule.  We then carefully protected the axillary nerve as we gently released the inferior capsule to fully mobilize the subscapularis.  An anterior deltoid retractor was then placed as well as a small Hohmann retractor superiorly.   The glenoid was inspected and had evidence of severe osteoarthritic wear with full-thickness cartilage loss  and exposed subchondral bone.  The remaining labrum was removed circumferentially taking great care not to disrupt the posterior capsule.   The glenoid drill guide was placed and used to drill a guide pin in the center, inferior position. The glenoid face was then reamed concentrically over the guide wire. The center hole was drilled over the guidepin in a near anatomic angle of version. Next the glenoid vault was drilled back to a depth of 20 mm.  We tapped and then placed a 24 mm size baseplate, full 10 degree augment, with 4 mm lateralization was selected with a 10 mm x 20 mm length central screw.  The base plate was pressfit into the glenoid vault obtaining secure fixation. We next placed a single non-locking screw and multiple locking screws for additional fixation.  Next a 36 mm glenosphere was selected and impacted onto the baseplate. The center screw was tightened.    We turned attention back to the humeral side. The cut protector was removed.  We started with a canal finder and then broached up to a 6, achieving excellent fixation. We trialed with multiple size tray and polyethylene options and selected a tray and 3 mm poly which provided good stability and range of motion without excess soft tissue tension. The offset was dialed in to match the normal anatomy. The shoulder was trialed.  There was good ROM in all planes and the  shoulder was stable with no inferior translation.  The real humeral implants were opened after again confirming sizes.  The trial was removed. Fiberwire x 2 sutures passed through the humeral neck for subscap repair. The humeral component was press-fit obtaining a secure fit. A +0 netural offset tray was selected and impacted onto the stem.  A 36+2 polyethylene liner was impacted onto the stem.  The joint was reduced and thoroughly irrigated with pulsatile lavage. Subscap was repaired back with #2 Fiberwire sutures through bone tunnels. Hemostasis was obtained. The  deltopectoral interval was reapproximated with #1 Vicryl. The subcutaneous tissues were closed with 2-0 monocryl and the skin was closed with running monocryl.    The wounds were cleaned and dried and an Aquacel dressing was placed. The drapes taken down. The arm was placed into sling with abduction pillow. Patient was awakened, extubated, and transferred to the recovery room in stable condition. There were no intraoperative complications. The sponge, needle, and attention counts were  correct at the end of the case.   Post-operative plan:  The patient will be admitted for over night observation.   We have placed a referral for PT to begin 1-2 weeks postop, prior to the first postoperative clinic visit.  DVT prophylaxis Aspirin 81 mg twice daily for 6 weeks.    Pain control with PRN pain medication preferring oral medicines.   Follow up plan will be scheduled in approximately 10-14 days for incision check and XR.

## 2021-09-30 ENCOUNTER — Encounter (HOSPITAL_COMMUNITY): Payer: Self-pay | Admitting: Orthopedic Surgery

## 2021-10-03 ENCOUNTER — Telehealth: Payer: Self-pay | Admitting: Radiology

## 2021-10-03 NOTE — Telephone Encounter (Signed)
Fax received from [facility] stating that Oxycodone IR 5mg  was denied. Patient says that her daughter did get her the pain medication the evening of the DOS.  She felt really bad on Day 2 and 3, but is feeling some better now.    She says the social worker at the hospital was supposed to get her a shower chair.  Rx sent to [facility] for this.  FYI

## 2021-10-03 NOTE — Telephone Encounter (Signed)
Agreed.  Sent you an email with the denial notification/rationale.

## 2021-10-05 ENCOUNTER — Telehealth: Payer: Self-pay | Admitting: Orthopedic Surgery

## 2021-10-05 ENCOUNTER — Other Ambulatory Visit: Payer: Self-pay | Admitting: Orthopedic Surgery

## 2021-10-05 DIAGNOSIS — Z96612 Presence of left artificial shoulder joint: Secondary | ICD-10-CM

## 2021-10-05 DIAGNOSIS — M19012 Primary osteoarthritis, left shoulder: Secondary | ICD-10-CM

## 2021-10-05 NOTE — Telephone Encounter (Signed)
Patient called and left voicemail stating PT rehab center says they have not heard anything from [place] to set up a rehab schedule.  She is suppose to have 1 or 2 visits before she comes and sees Dr. Amedeo Kinsman..   Please call her back at [phone] and let her know what is going on she said.

## 2021-10-05 NOTE — Telephone Encounter (Signed)
Ollen Gross from [facility] called at  2:23 and left voicemail stating Virginia [name] has called her and states she is suppose to have PT with them and she said they do not have a referral from Dr. Amedeo Kinsman for this patient to be seen, they're is no order in the system she said either.    Please call Ollen Gross back at [phone]

## 2021-10-05 NOTE — Telephone Encounter (Signed)
Izora Gala emailed me earlier, and asked about order.  OT order entered in chart, Izora Gala aware.

## 2021-10-06 ENCOUNTER — Encounter (HOSPITAL_COMMUNITY): Payer: Self-pay | Admitting: Occupational Therapy

## 2021-10-06 ENCOUNTER — Ambulatory Visit (HOSPITAL_COMMUNITY): Payer: Medicare Other | Attending: Orthopedic Surgery | Admitting: Occupational Therapy

## 2021-10-06 ENCOUNTER — Other Ambulatory Visit: Payer: Self-pay

## 2021-10-06 DIAGNOSIS — R29898 Other symptoms and signs involving the musculoskeletal system: Secondary | ICD-10-CM | POA: Diagnosis not present

## 2021-10-06 DIAGNOSIS — M25512 Pain in left shoulder: Secondary | ICD-10-CM | POA: Insufficient documentation

## 2021-10-06 DIAGNOSIS — M25612 Stiffness of left shoulder, not elsewhere classified: Secondary | ICD-10-CM | POA: Insufficient documentation

## 2021-10-06 NOTE — Patient Instructions (Signed)
1) SHOULDER: Flexion On Table   Place hands on towel placed on table, elbows straight. Lean forward with you upper body, pushing towel away from body.  _10-15__ reps per set, __2-3_ sets per day  2) Abduction (Passive)   With arm out to side, resting on towel placed on table with palm DOWN, keeping trunk away from table, lean to the side while pushing towel away from body.  Repeat __10-15__ times. Do _2-3___ sessions per day.  Copyright  VHI. All rights reserved.     3) Internal Rotation (Assistive)   Seated with elbow bent at right angle and held against side, slide arm on table surface in an inward arc keeping elbow anchored in place. Repeat ___10-15_ times. Do __2-3__ sessions per day. Activity: Use this motion to brush crumbs off the table.  Copyright  VHI. All rights reserved.    Complete exercises 10-15 times each, 2-3 times per day. 1) Elbow flexion and extension Bend your elbow upwards as shown and then lower to a straighten position.     2) Forearm supination and pronation Hold elbow at a right angle stabilizing on a table or armrest. Keep elbow at side and turn palm up and down.

## 2021-10-06 NOTE — Therapy (Signed)
[place], [place], [address] Phone: [phone]   Fax:  [phone]  Occupational Therapy Evaluation  Patient Details  Name: Virginia [name] MRN: [id number] Date of Birth: 10-20-1946 Referring Provider (OT): Dr. Larena Glassman   Encounter Date: 10/06/2021   OT End of Session - 10/06/21 1719     Visit Number 1    Number of Visits 16    Date for OT Re-Evaluation 12/05/21   mini-reassessment 11/03/2021   Authorization Type 1) Medicare A & B 2) BCBS    Authorization Time Period BCBS visit limit 86    Authorization - Visit Number 1    Authorization - Number of Visits 75    Progress Note Due on Visit 10    OT Start Time 1433    OT Stop Time 1513    OT Time Calculation (min) 40 min    Activity Tolerance Patient tolerated treatment well    Behavior During Therapy [facility] for tasks assessed/performed             Past Medical History:  Diagnosis Date   Anxiety    Depression    HTN (hypertension)    resolved with weight loss s/p gastric bypass   Hypothyroidism    Since at least 2007   Osteoporosis    RLS (restless legs syndrome)    Sleep apnea    Stroke [facility])    2013/February 2017    Past Surgical History:  Procedure Laterality Date   BREAST BIOPSY Right    benign   CATARACT EXTRACTION  2018   CHOLECYSTECTOMY     COLONOSCOPY  2004   internal hemorrhoids, left-sided diverticulae, splenic flexure polyp (103mm), path unavailable at this time   COLONOSCOPY  04/2011   RMR: Internal and external hemorrhoids, rectal tubular adenoma removed, left-sided diverticulosis   COLONOSCOPY N/A 06/01/2016   Procedure: COLONOSCOPY;  Surgeon: Daneil Dolin, MD;  Location: [facility] ENDO SUITE;  Service: Endoscopy;  Laterality: N/A;  0830   ESOPHAGOGASTRODUODENOSCOPY  2004   small hiatal hernia   GASTRIC ROUX-EN-Y  2006   REVERSE SHOULDER ARTHROPLASTY Left 09/29/2021   Procedure: LEFT REVERSE SHOULDER ARTHROPLASTY;  Surgeon: Mordecai Rasmussen, MD;  Location: [facility] ORS;  Service: Orthopedics;  Laterality: Left;    There were no vitals filed for this visit.   Subjective Assessment - 10/06/21 1714     Subjective  S: The first two days were terrible.    Patient is accompanied by: Family member   daughter-Virginia [name]   Pertinent History Pt is a 75 y/o female s/p left reverse total shoulder arthroplasty on 09/29/21 presenting in abduction sling, daughter present for evaluation. Pt reports she has been very careful with her arm and is not moving it. Pt was referred to occupational therapy for evaluation and treatment by Dr. Larena Glassman.    Special Tests FOTO: 4/100    Currently in Pain? No/denies               [facility] OT Assessment - 10/06/21 1431       Assessment   Medical Diagnosis s/p left reverse TSA    Referring Provider (OT) Dr. Larena Glassman    Onset Date/Surgical Date 09/29/21    Hand Dominance Right    Next MD Visit 10/14/2021    Prior Therapy None      Precautions   Precautions Shoulder    Type of Shoulder Precautions See protocol. Sx 09/29/21    Shoulder Interventions  Shoulder sling/immobilizer;Off for dressing/bathing/exercises      Restrictions   Weight Bearing Restrictions Yes    Other Position/Activity Restrictions NWB      Balance Screen   Has the patient fallen in the past 6 months No      Prior Function   Level of Independence Independent    Leisure sewing, painting      ADL   ADL comments Pt is unable to use LUE for any ADLs. Sleeping in recliner at the moment      Written Expression   Dominant Hand Right      Cognition   Overall Cognitive Status Within Functional Limits for tasks assessed      Observation/Other Assessments   Focus on Therapeutic Outcomes (FOTO)  4/100      ROM / Strength   AROM / PROM / Strength PROM;AROM;Strength      Palpation   Palpation comment Mod fascial restrictions along left upper arm, trapezius, and scapular regions      AROM   Overall AROM  Deficits;Unable  to assess;Due to precautions      PROM   Overall PROM Comments Assessed supine, er/IR adducted    PROM Assessment Site Shoulder    Right/Left Shoulder Left    Left Shoulder Flexion 79 Degrees    Left Shoulder ABduction 70 Degrees    Left Shoulder Internal Rotation 90 Degrees    Left Shoulder External Rotation 0 Degrees      Strength   Overall Strength Deficits;Unable to assess;Due to precautions                              OT Education - 10/06/21 1718     Education Details table slides, elbow flexion/abduction, forearm supination/pronation    Person(s) Educated Patient    Methods Explanation;Demonstration;Handout    Comprehension Verbalized understanding;Returned demonstration              OT Short Term Goals - 10/06/21 1723       OT SHORT TERM GOAL #1   Title Pt will be provided with and educated on HEP to improve LUE mobility required for use during ADLs.    Time 4    Period Weeks    Status New    Target Date 11/05/21      OT SHORT TERM GOAL #2   Title Pt will increase LUE P/ROM to improve ability to donn and doff shirts independently.    Time 4    Period Weeks    Status New      OT SHORT TERM GOAL #3   Title Pt will increase LUE strength to 3/5 or greater to improve ability to reach items at waist to chest height during ADLs.    Time 4    Period Weeks    Status New               OT Long Term Goals - 10/06/21 1724       OT LONG TERM GOAL #1   Title Pt will decrease LUE pain to 3/10 or less to improve ability to sleep in the bed instead of the recliner.    Time 8    Period Weeks    Status New    Target Date 12/05/21      OT LONG TERM GOAL #2   Title Pt will increase LUE A/ROM to [facility] to improve ability to perform reaching tasks overhead and behind back during  ADLs.    Time 8    Period Weeks    Status New      OT LONG TERM GOAL #3   Title Pt will decrease LUE fascial restrictions to min amounts or less to improve mobility  required for functional reaching tasks.    Time 8    Period Weeks    Status New      OT LONG TERM GOAL #4   Title Pt will increase LUE strength to 4+/5 or greater to improve ability to perform housekeeping tasks requiring lifting or pulling/pushing.    Time 8    Period Weeks    Status New      OT LONG TERM GOAL #5   Title Pt will achieve highest level of functioning using LUE as non-dominant during ADL and leisure tasks.    Time 8    Period Weeks    Status New                   Plan - 10/06/21 1720     Clinical Impression Statement A: Pt is a 75 y/o female presenting s/p left reverse total shoulder arthroplasty on 09/29/21 with limitations impacting functional use of non-dominant LUE during ADLs. Pt initially guarded with LUE however relaxed and was able to significantly improve confidence in LUE mobility and allow for good passive stretching.    OT Occupational Profile and History Problem Focused Assessment - Including review of records relating to presenting problem    Occupational performance deficits (Please refer to evaluation for details): ADL's;IADL's;Rest and Sleep;Leisure    Body Structure / Function / Physical Skills ADL;Endurance;Muscle spasms;UE functional use;Fascial restriction;Pain;ROM;IADL;Strength    Rehab Potential Good    Clinical Decision Making Limited treatment options, no task modification necessary    Comorbidities Affecting Occupational Performance: None    Modification or Assistance to Complete Evaluation  No modification of tasks or assist necessary to complete eval    OT Frequency 2x / week    OT Duration 8 weeks    OT Treatment/Interventions Self-care/ADL training;Ultrasound;DME and/or AE instruction;Patient/family education;Scar mobilization;Passive range of motion;Cryotherapy;Electrical Stimulation;Moist Heat;Therapeutic exercise;Manual Therapy;Therapeutic activities    Plan P: Pt will benefit from skilled OT services to decrease pain and fascial  restrictions, increase joint ROM, strength, and functional use during ADLs. Treatment plan: myofascial release, P/ROM, AA/ROM, A/ROM, general LUE strengthening, scapular mobility/stability/strengthening, modalities prn    OT Home Exercise Plan eval: table slides, elbow flexion/extension, forearm supination/pronation    Consulted and Agree with Plan of Care Patient             Patient will benefit from skilled therapeutic intervention in order to improve the following deficits and impairments:   Body Structure / Function / Physical Skills: ADL, Endurance, Muscle spasms, UE functional use, Fascial restriction, Pain, ROM, IADL, Strength       Visit Diagnosis: Acute pain of left shoulder  Stiffness of left shoulder, not elsewhere classified  Other symptoms and signs involving the musculoskeletal system    Problem List Patient Active Problem List   Diagnosis Date Noted   Glenohumeral arthritis, left 09/29/2021   Intolerance of continuous positive airway pressure (CPAP) ventilation 02/17/2021   Status post bariatric surgery 02/17/2021   Periodic limb movement disorder (PLMD) 02/17/2021   OSA (obstructive sleep apnea) 02/17/2021   RLS (restless legs syndrome) 01/22/2018   History of colonic polyps    Constipation 05/11/2016   Gait difficulty 01/04/2016   Aphasia 01/04/2016   Acute CVA (cerebrovascular accident) ([place])  01/04/2016   CVA (cerebral infarction) 01/04/2016   Ataxia    OSA on CPAP 12/02/2013   CTS (carpal tunnel syndrome) 03/18/2013   Wrist fracture 02/04/2013   Distal radius fracture 12/26/2012   Hyperlipidemia 06/30/2012   Elevated blood pressure reading without diagnosis of hypertension 06/29/2012   Stroke ([place]) 06/28/2012   Hypothyroidism 06/28/2012   Anxiety 06/28/2012   Personal history of colonic polyps 04/26/2011   Family history of colonic polyps 04/26/2011   ARTHRITIS, LEFT KNEE 06/06/2010   DERANGEMENT OF POSTERIOR HORN OF MEDIAL MENISCUS 06/06/2010    ANEMIA, B12 DEFICIENCY 02/07/2010   TRIGGER FINGER, THUMB 02/07/2010   SHOULDER PAIN 10/14/2008   BURSITIS, SHOULDER 10/14/2008   TRIGGER FINGER 06/15/2008    Guadelupe Sabin, OTR/L  [phone] 10/06/2021, 5:33 PM  [place] [address] [place], [place], [address] Phone: [phone]   Fax:  [phone]  Name: Virginia [name] MRN: [id number] Date of Birth: 1946/09/09

## 2021-10-07 ENCOUNTER — Ambulatory Visit (HOSPITAL_COMMUNITY): Payer: Medicare Other

## 2021-10-07 ENCOUNTER — Encounter (HOSPITAL_COMMUNITY): Payer: Self-pay

## 2021-10-07 DIAGNOSIS — M25612 Stiffness of left shoulder, not elsewhere classified: Secondary | ICD-10-CM

## 2021-10-07 DIAGNOSIS — R29898 Other symptoms and signs involving the musculoskeletal system: Secondary | ICD-10-CM | POA: Diagnosis not present

## 2021-10-07 DIAGNOSIS — M25512 Pain in left shoulder: Secondary | ICD-10-CM

## 2021-10-07 NOTE — Therapy (Signed)
[place], [place], [address] Phone: [phone]   Fax:  [phone]  Occupational Therapy Treatment  Patient Details  Name: Virginia [name] MRN: [id number] Date of Birth: [birth year] Referring Provider (OT): Dr. Larena Glassman   Encounter Date: 10/07/2021   OT End of Session - 10/07/21 1033     Visit Number 2    Number of Visits 16    Date for OT Re-Evaluation 12/05/21   mini-reassessment 11/03/2021   Authorization Type 1) Medicare A & B 2) BCBS    Authorization Time Period BCBS visit limit 43    Authorization - Visit Number 2    Authorization - Number of Visits 75    Progress Note Due on Visit 10    OT Start Time 0945    OT Stop Time 5400    OT Time Calculation (min) 38 min    Activity Tolerance Patient tolerated treatment well    Behavior During Therapy WFL for tasks assessed/performed             Past Medical History:  Diagnosis Date   Anxiety    Depression    HTN (hypertension)    resolved with weight loss s/p gastric bypass   Hypothyroidism    Since at least 2007   Osteoporosis    RLS (restless legs syndrome)    Sleep apnea    Stroke [facility])    2013/February 2017    Past Surgical History:  Procedure Laterality Date   BREAST BIOPSY Right    benign   CATARACT EXTRACTION  2018   CHOLECYSTECTOMY     COLONOSCOPY  2004   internal hemorrhoids, left-sided diverticulae, splenic flexure polyp (69mm), path unavailable at this time   COLONOSCOPY  04/2011   RMR: Internal and external hemorrhoids, rectal tubular adenoma removed, left-sided diverticulosis   COLONOSCOPY N/A 06/01/2016   Procedure: COLONOSCOPY;  Surgeon: Daneil Dolin, MD;  Location: [facility];  Service: Endoscopy;  Laterality: N/A;  0830   ESOPHAGOGASTRODUODENOSCOPY  2004   small hiatal hernia   GASTRIC ROUX-EN-Y  2006   REVERSE SHOULDER ARTHROPLASTY Left 09/29/2021   Procedure: LEFT REVERSE SHOULDER ARTHROPLASTY;  Surgeon: Mordecai Rasmussen, MD;  Location: [facility];  Service: Orthopedics;  Laterality: Left;    There were no vitals filed for this visit.   Subjective Assessment - 10/07/21 0951     Subjective  S: I did my exercises yesterday evening. I wasn't able to do them as much as I did yesterday here.    Patient is accompanied by: Family member   Daughter   Currently in Pain? Yes    Pain Score 2     Pain Location Shoulder    Pain Orientation Left    Pain Descriptors / Indicators Sore    Pain Type Surgical pain    Pain Onset Yesterday    Pain Frequency Occasional    Aggravating Factors  movement, eval yesterday    Pain Relieving Factors pain medication    Effect of Pain on Daily Activities Pt unanle to utilize her LUE for any daily tasks.                [facility] OT Assessment - 10/07/21 1014       Assessment   Medical Diagnosis s/p left reverse TSA      Precautions   Precautions Shoulder    Type of Shoulder Precautions See protocol.    Shoulder Interventions Shoulder sling/immobilizer;Off for dressing/bathing/exercises  OT Treatments/Exercises (OP) - 10/07/21 1014       Exercises   Exercises Elbow;Shoulder      Shoulder Exercises: Supine   Protraction PROM;5 reps    Horizontal ABduction PROM;5 reps    External Rotation PROM;5 reps    Internal Rotation PROM;5 reps    Flexion PROM;5 reps    ABduction PROM;5 reps      Shoulder Exercises: Seated   Extension AROM;10 reps    Row AROM;10 reps      Shoulder Exercises: Therapy Ball   Flexion 10 reps   2" hold     Elbow Exercises   Other elbow exercises P/ROM elbow flexion/extension; seated 10X      Manual Therapy   Manual Therapy Myofascial release    Manual therapy comments Manual therapy completed prior to exercises.    Myofascial Release Myofascial release and manual stretching completed to left upper arm, upper trapezius, and scapularis region to decrease fascial restrictions and increase joint mobility  in a pain free zone.                      OT Short Term Goals - 10/07/21 1017       OT SHORT TERM GOAL #1   Title Pt will be provided with and educated on HEP to improve LUE mobility required for use during ADLs.    Time 4    Period Weeks    Status On-going    Target Date 11/05/21      OT SHORT TERM GOAL #2   Title Pt will increase LUE P/ROM to improve ability to donn and doff shirts independently.    Time 4    Period Weeks    Status On-going      OT SHORT TERM GOAL #3   Title Pt will increase LUE strength to 3/5 or greater to improve ability to reach items at waist to chest height during ADLs.    Time 4    Period Weeks    Status On-going               OT Long Term Goals - 10/07/21 1017       OT LONG TERM GOAL #1   Title Pt will decrease LUE pain to 3/10 or less to improve ability to sleep in the bed instead of the recliner.    Time 8    Period Weeks    Status On-going      OT LONG TERM GOAL #2   Title Pt will increase LUE A/ROM to [facility] to improve ability to perform reaching tasks overhead and behind back during ADLs.    Time 8    Period Weeks    Status On-going      OT LONG TERM GOAL #3   Title Pt will decrease LUE fascial restrictions to min amounts or less to improve mobility required for functional reaching tasks.    Time 8    Period Weeks    Status On-going      OT LONG TERM GOAL #4   Title Pt will increase LUE strength to 4+/5 or greater to improve ability to perform housekeeping tasks requiring lifting or pulling/pushing.    Time 8    Period Weeks    Status On-going      OT LONG TERM GOAL #5   Title Pt will achieve highest level of functioning using LUE as non-dominant during ADL and leisure tasks.    Time 8    Period  Weeks    Status On-going                   Plan - 10/07/21 1033     Clinical Impression Statement A: Initiated myofascial release, manual stretching to left upper arm and upper trapezius to address mod-max  fascial restrictions. Pain level guided how far patient was able to tolerate passive stretching. VC for form and technique were provided. Cues to not hold breathe during passive ROM and to decrease muscle guarding.    Body Structure / Function / Physical Skills ADL;Endurance;Muscle spasms;UE functional use;Fascial restriction;Pain;ROM;IADL;Strength    Plan P: Add scapular depression. Continue to work on increasing passive ROM as pain tolerance improves with less muscle guarding.    Consulted and Agree with Plan of Care Patient             Patient will benefit from skilled therapeutic intervention in order to improve the following deficits and impairments:   Body Structure / Function / Physical Skills: ADL, Endurance, Muscle spasms, UE functional use, Fascial restriction, Pain, ROM, IADL, Strength       Visit Diagnosis: Acute pain of left shoulder  Other symptoms and signs involving the musculoskeletal system  Stiffness of left shoulder, not elsewhere classified    Problem List Patient Active Problem List   Diagnosis Date Noted   Glenohumeral arthritis, left 09/29/2021   Intolerance of continuous positive airway pressure (CPAP) ventilation 02/17/2021   Status post bariatric surgery 02/17/2021   Periodic limb movement disorder (PLMD) 02/17/2021   OSA (obstructive sleep apnea) 02/17/2021   RLS (restless legs syndrome) 01/22/2018   History of colonic polyps    Constipation 05/11/2016   Gait difficulty 01/04/2016   Aphasia 01/04/2016   Acute CVA (cerebrovascular accident) ([place]) 01/04/2016   CVA (cerebral infarction) 01/04/2016   Ataxia    OSA on CPAP 12/02/2013   CTS (carpal tunnel syndrome) 03/18/2013   Wrist fracture 02/04/2013   Distal radius fracture 12/26/2012   Hyperlipidemia 06/30/2012   Elevated blood pressure reading without diagnosis of hypertension 06/29/2012   Stroke ([place]) 06/28/2012   Hypothyroidism 06/28/2012   Anxiety 06/28/2012   Personal history of  colonic polyps 04/26/2011   Family history of colonic polyps 04/26/2011   ARTHRITIS, LEFT KNEE 06/06/2010   DERANGEMENT OF POSTERIOR HORN OF MEDIAL MENISCUS 06/06/2010   ANEMIA, B12 DEFICIENCY 02/07/2010   TRIGGER FINGER, THUMB 02/07/2010   SHOULDER PAIN 10/14/2008   BURSITIS, SHOULDER 10/14/2008   TRIGGER FINGER 06/15/2008    Ailene Ravel, OTR/L,CBIS  [phone]  10/07/2021, 10:57 AM  [place], [place], [address] Phone: [phone]   Fax:  [phone]  Name: Virginia [name] MRN: [id number] Date of Birth: 11-Mar-1946

## 2021-10-12 ENCOUNTER — Encounter (HOSPITAL_COMMUNITY): Payer: Self-pay

## 2021-10-12 ENCOUNTER — Ambulatory Visit (HOSPITAL_COMMUNITY): Payer: Medicare Other

## 2021-10-12 ENCOUNTER — Other Ambulatory Visit: Payer: Self-pay

## 2021-10-12 DIAGNOSIS — M25612 Stiffness of left shoulder, not elsewhere classified: Secondary | ICD-10-CM | POA: Diagnosis not present

## 2021-10-12 DIAGNOSIS — M25512 Pain in left shoulder: Secondary | ICD-10-CM

## 2021-10-12 DIAGNOSIS — R29898 Other symptoms and signs involving the musculoskeletal system: Secondary | ICD-10-CM | POA: Diagnosis not present

## 2021-10-12 NOTE — Therapy (Signed)
[name], [place], [address] Phone: [phone]   Fax:  [phone]  Occupational Therapy Treatment  Patient Details  Name: Virginia [name] MRN: [id number] Date of Birth: 1946/05/12 Referring Provider (OT): Dr. Larena Glassman   Encounter Date: 10/12/2021   OT End of Session - 10/12/21 1019     Visit Number 3    Number of Visits 16    Date for OT Re-Evaluation 12/05/21   mini-reassessment 11/03/2021   Authorization Type 1) Medicare A & B 2) BCBS    Authorization Time Period BCBS visit limit 62    Authorization - Visit Number 3    Authorization - Number of Visits 75    Progress Note Due on Visit 10    OT Start Time 0945    OT Stop Time 5681    OT Time Calculation (min) 38 min    Activity Tolerance Patient tolerated treatment well    Behavior During Therapy WFL for tasks assessed/performed             Past Medical History:  Diagnosis Date   Anxiety    Depression    HTN (hypertension)    resolved with weight loss s/p gastric bypass   Hypothyroidism    Since at least 2007   Osteoporosis    RLS (restless legs syndrome)    Sleep apnea    Stroke [facility])    2013/February 2017    Past Surgical History:  Procedure Laterality Date   BREAST BIOPSY Right    benign   CATARACT EXTRACTION  2018   CHOLECYSTECTOMY     COLONOSCOPY  2004   internal hemorrhoids, left-sided diverticulae, splenic flexure polyp (71mm), path unavailable at this time   COLONOSCOPY  04/2011   RMR: Internal and external hemorrhoids, rectal tubular adenoma removed, left-sided diverticulosis   COLONOSCOPY N/A 06/01/2016   Procedure: COLONOSCOPY;  Surgeon: Daneil Dolin, MD;  Location: [facility];  Service: Endoscopy;  Laterality: N/A;  0830   ESOPHAGOGASTRODUODENOSCOPY  2004   small hiatal hernia   GASTRIC ROUX-EN-Y  2006   REVERSE SHOULDER ARTHROPLASTY Left 09/29/2021   Procedure: LEFT REVERSE SHOULDER ARTHROPLASTY;  Surgeon: Mordecai Rasmussen, MD;  Location: [facility];  Service: Orthopedics;  Laterality: Left;    There were no vitals filed for this visit.   Subjective Assessment - 10/12/21 1013     Subjective  S: I have this lump on my arm that I want you to look at. I'm worried about it.    Patient is accompanied by: Family member   Daughter   Currently in Pain? Yes    Pain Score 2     Pain Location Shoulder    Pain Orientation Left    Pain Descriptors / Indicators Sore    Pain Onset In the past 7 days    Pain Frequency Occasional    Aggravating Factors  movement    Pain Relieving Factors pain medication    Effect of Pain on Daily Activities patient is unable to utilize her LUE for any daily tasks.                [facility] OT Assessment - 10/12/21 1015       Assessment   Medical Diagnosis s/p left reverse TSA      Precautions   Precautions Shoulder    Type of Shoulder Precautions See protocol.    Shoulder Interventions Shoulder sling/immobilizer;Off for dressing/bathing/exercises  OT Treatments/Exercises (OP) - 10/12/21 1016       Exercises   Exercises Elbow;Shoulder      Shoulder Exercises: Supine   Protraction PROM;5 reps    Horizontal ABduction PROM;5 reps    External Rotation PROM;5 reps    Internal Rotation PROM;5 reps    Flexion PROM;5 reps    ABduction PROM;5 reps      Shoulder Exercises: Seated   Extension AROM;10 reps    Row AROM;10 reps      Shoulder Exercises: Therapy Ball   Flexion 10 reps   2" hold at end stretch   ABduction 10 reps   2" hold at end stretch     Manual Therapy   Manual Therapy Myofascial release    Manual therapy comments Manual therapy completed prior to exercises.    Myofascial Release Myofascial release and manual stretching completed to left upper arm, upper trapezius, and scapularis region to decrease fascial restrictions and increase joint mobility in a pain free zone.                      OT Short Term  Goals - 10/07/21 1017       OT SHORT TERM GOAL #1   Title Pt will be provided with and educated on HEP to improve LUE mobility required for use during ADLs.    Time 4    Period Weeks    Status On-going    Target Date 11/05/21      OT SHORT TERM GOAL #2   Title Pt will increase LUE P/ROM to improve ability to donn and doff shirts independently.    Time 4    Period Weeks    Status On-going      OT SHORT TERM GOAL #3   Title Pt will increase LUE strength to 3/5 or greater to improve ability to reach items at waist to chest height during ADLs.    Time 4    Period Weeks    Status On-going               OT Long Term Goals - 10/07/21 1017       OT LONG TERM GOAL #1   Title Pt will decrease LUE pain to 3/10 or less to improve ability to sleep in the bed instead of the recliner.    Time 8    Period Weeks    Status On-going      OT LONG TERM GOAL #2   Title Pt will increase LUE A/ROM to [facility] to improve ability to perform reaching tasks overhead and behind back during ADLs.    Time 8    Period Weeks    Status On-going      OT LONG TERM GOAL #3   Title Pt will decrease LUE fascial restrictions to min amounts or less to improve mobility required for functional reaching tasks.    Time 8    Period Weeks    Status On-going      OT LONG TERM GOAL #4   Title Pt will increase LUE strength to 4+/5 or greater to improve ability to perform housekeeping tasks requiring lifting or pulling/pushing.    Time 8    Period Weeks    Status On-going      OT LONG TERM GOAL #5   Title Pt will achieve highest level of functioning using LUE as non-dominant during ADL and leisure tasks.    Time 8    Period Weeks  Status On-going                   Plan - 10/12/21 1019     Clinical Impression Statement A: Focused beginning of session on edema manual techniques to left arm anterior and posterior region with great response. Patient was able to tolerate passive flexion and abduction  to approximately 90 degrees. Continues to need occassional cues to relax and eliminate muscle guarding. VC for form and technique were provided during session.    Body Structure / Function / Physical Skills ADL;Endurance;Muscle spasms;UE functional use;Fascial restriction;Pain;ROM;IADL;Strength    Plan P: Add isometrics supine. follow up on MD appointment.    Consulted and Agree with Plan of Care Patient             Patient will benefit from skilled therapeutic intervention in order to improve the following deficits and impairments:   Body Structure / Function / Physical Skills: ADL, Endurance, Muscle spasms, UE functional use, Fascial restriction, Pain, ROM, IADL, Strength       Visit Diagnosis: Acute pain of left shoulder  Stiffness of left shoulder, not elsewhere classified  Other symptoms and signs involving the musculoskeletal system    Problem List Patient Active Problem List   Diagnosis Date Noted   Glenohumeral arthritis, left 09/29/2021   Intolerance of continuous positive airway pressure (CPAP) ventilation 02/17/2021   Status post bariatric surgery 02/17/2021   Periodic limb movement disorder (PLMD) 02/17/2021   OSA (obstructive sleep apnea) 02/17/2021   RLS (restless legs syndrome) 01/22/2018   History of colonic polyps    Constipation 05/11/2016   Gait difficulty 01/04/2016   Aphasia 01/04/2016   Acute CVA (cerebrovascular accident) ([place]) 01/04/2016   CVA (cerebral infarction) 01/04/2016   Ataxia    OSA on CPAP 12/02/2013   CTS (carpal tunnel syndrome) 03/18/2013   Wrist fracture 02/04/2013   Distal radius fracture 12/26/2012   Hyperlipidemia 06/30/2012   Elevated blood pressure reading without diagnosis of hypertension 06/29/2012   Stroke ([place]) 06/28/2012   Hypothyroidism 06/28/2012   Anxiety 06/28/2012   Personal history of colonic polyps 04/26/2011   Family history of colonic polyps 04/26/2011   ARTHRITIS, LEFT KNEE 06/06/2010   DERANGEMENT OF  POSTERIOR HORN OF MEDIAL MENISCUS 06/06/2010   ANEMIA, B12 DEFICIENCY 02/07/2010   TRIGGER FINGER, THUMB 02/07/2010   SHOULDER PAIN 10/14/2008   BURSITIS, SHOULDER 10/14/2008   TRIGGER FINGER 06/15/2008   Ailene Ravel, OTR/L,CBIS  [phone]  10/12/2021, 10:36 AM  [place], [place], [address] Phone: [phone]   Fax:  [phone]  Name: LAPARIS DURRETT MRN: [id number] Date of Birth: 1946-09-15

## 2021-10-14 ENCOUNTER — Other Ambulatory Visit: Payer: Self-pay

## 2021-10-14 ENCOUNTER — Ambulatory Visit (INDEPENDENT_AMBULATORY_CARE_PROVIDER_SITE_OTHER): Payer: Medicare Other | Admitting: Orthopedic Surgery

## 2021-10-14 ENCOUNTER — Ambulatory Visit: Payer: Medicare Other

## 2021-10-14 ENCOUNTER — Encounter: Payer: Self-pay | Admitting: Orthopedic Surgery

## 2021-10-14 DIAGNOSIS — Z96612 Presence of left artificial shoulder joint: Secondary | ICD-10-CM

## 2021-10-14 MED ORDER — CELECOXIB 100 MG PO CAPS: 100.0000 mg | ORAL_CAPSULE | Freq: Two times a day (BID) | ORAL | 0 refills | Status: AC | PRN

## 2021-10-14 NOTE — Progress Notes (Signed)
Orthopaedic Postop Note  Assessment: Virginia [name] is a 75 y.o. female s/p Left Reverse Shoulder Arthroplasty  DOS: 09/29/2021  Plan: Sutures were trimmed, steri strips were placed Ok to remove the abduction pillow; can stop using the sling around 4 weeks postop Physical therapy prescription and protocol provided Anticipated progression discussed, XR reviewed in clinic Follow up 4 weeks    Follow-up: Return in about 4 weeks (around 11/11/2021).  XR at next visit: Left shoulder  Subjective:  Chief Complaint  Patient presents with   Shoulder Pain    DOS 09/29/21 S/p left total reverse shoulder    History of Present Illness: Virginia [name] is a 75 y.o. female who presents following the above stated procedure.  She is doing very well.  She is taking Tylenol occasionally for pain.  She has worked with physical therapy, having had 3 sessions already.  She is very pleased with her progress to date.  She denies numbness and tingling.  No issues with the surgical incision.  Review of Systems: No fevers or chills No numbness or tingling No Chest Pain No shortness of breath    Objective: There were no vitals taken for this visit.  Physical Exam:  Alert and oriented.  No acute distress.  Surgical incision is healing well.  No surrounding erythema or drainage.  Sensation is intact in the axillary nerve distribution.  She is able to fire her deltoid.  Fingers are warm and well-perfused.  Sensations intact throughout the hand.  2+ radial pulse.  Passive forward flexion to 110 degrees with minimal discomfort.  IMAGING: I personally ordered and reviewed the following images:  XR of the Left shoulder were obtained in clinic today and demonstrates a reverse shoulder arthroplasty with implants in good position.  No evidence of acute injury or subsidence of implants.   Impression: Left shoulder arthroplasty in good position   Mordecai Rasmussen, MD 10/14/2021 10:06 AM

## 2021-10-17 ENCOUNTER — Ambulatory Visit (HOSPITAL_COMMUNITY): Payer: Medicare Other | Admitting: Occupational Therapy

## 2021-10-17 ENCOUNTER — Other Ambulatory Visit: Payer: Self-pay

## 2021-10-17 ENCOUNTER — Encounter (HOSPITAL_COMMUNITY): Payer: Self-pay | Admitting: Occupational Therapy

## 2021-10-17 DIAGNOSIS — M25612 Stiffness of left shoulder, not elsewhere classified: Secondary | ICD-10-CM | POA: Diagnosis not present

## 2021-10-17 DIAGNOSIS — M25512 Pain in left shoulder: Secondary | ICD-10-CM | POA: Diagnosis not present

## 2021-10-17 DIAGNOSIS — R29898 Other symptoms and signs involving the musculoskeletal system: Secondary | ICD-10-CM

## 2021-10-17 NOTE — Therapy (Signed)
[place], [place], [address] Phone: [phone]   Fax:  [phone]  Occupational Therapy Treatment  Patient Details  Name: Virginia [name] MRN: [id number] Date of Birth: 1946/03/27 Referring Provider (OT): Dr. Larena Glassman   Encounter Date: 10/17/2021   OT End of Session - 10/17/21 1507     Visit Number 4    Number of Visits 16    Date for OT Re-Evaluation 12/05/21   mini-reassessment 11/03/2021   Authorization Type 1) Medicare A & B 2) BCBS    Authorization Time Period BCBS visit limit 55    Authorization - Visit Number 4    Authorization - Number of Visits 75    Progress Note Due on Visit 10    OT Start Time 1432    OT Stop Time 1510    OT Time Calculation (min) 38 min    Activity Tolerance Patient tolerated treatment well    Behavior During Therapy [facility] for tasks assessed/performed             Past Medical History:  Diagnosis Date   Anxiety    Depression    HTN (hypertension)    resolved with weight loss s/p gastric bypass   Hypothyroidism    Since at least 2007   Osteoporosis    RLS (restless legs syndrome)    Sleep apnea    Stroke [facility])    2013/February 2017    Past Surgical History:  Procedure Laterality Date   BREAST BIOPSY Right    benign   CATARACT EXTRACTION  2018   CHOLECYSTECTOMY     COLONOSCOPY  2004   internal hemorrhoids, left-sided diverticulae, splenic flexure polyp (63mm), path unavailable at this time   COLONOSCOPY  04/2011   RMR: Internal and external hemorrhoids, rectal tubular adenoma removed, left-sided diverticulosis   COLONOSCOPY N/A 06/01/2016   Procedure: COLONOSCOPY;  Surgeon: Daneil Dolin, MD;  Location: [facility];  Service: Endoscopy;  Laterality: N/A;  0830   ESOPHAGOGASTRODUODENOSCOPY  2004   small hiatal hernia   GASTRIC ROUX-EN-Y  2006   REVERSE SHOULDER ARTHROPLASTY Left 09/29/2021   Procedure: LEFT REVERSE SHOULDER ARTHROPLASTY;  Surgeon: Mordecai Rasmussen, MD;  Location: [facility] ORS;  Service: Orthopedics;  Laterality: Left;    There were no vitals filed for this visit.   Subjective Assessment - 10/17/21 1433     Subjective  S: I went to the doctor on Friday and he took the bandage off.    Currently in Pain? No/denies                          OT Treatments/Exercises (OP) - 10/17/21 1433       Exercises   Exercises Elbow;Shoulder      Shoulder Exercises: Supine   Protraction PROM;5 reps    Horizontal ABduction PROM;5 reps    External Rotation PROM;5 reps    Internal Rotation PROM;5 reps    Flexion PROM;5 reps    ABduction PROM;5 reps      Shoulder Exercises: Seated   Extension AROM;10 reps    Row AROM;10 reps      Shoulder Exercises: Therapy Ball   Flexion 10 reps    ABduction 10 reps      Shoulder Exercises: ROM/Strengthening   Thumb Tacks 1' low level      Shoulder Exercises: Isometric Strengthening   Flexion Supine;3X5"    Extension Supine;3X5"  External Rotation Supine;3X5"    Internal Rotation Supine;3X5"    ABduction Supine;3X5"    ADduction Supine;3X5"      Manual Therapy   Manual Therapy Myofascial release    Manual therapy comments Manual therapy completed prior to exercises.    Myofascial Release Myofascial release and manual stretching completed to left upper arm, upper trapezius, and scapularis region to decrease fascial restrictions and increase joint mobility in a pain free zone.                      OT Short Term Goals - 10/07/21 1017       OT SHORT TERM GOAL #1   Title Pt will be provided with and educated on HEP to improve LUE mobility required for use during ADLs.    Time 4    Period Weeks    Status On-going    Target Date 11/05/21      OT SHORT TERM GOAL #2   Title Pt will increase LUE P/ROM to improve ability to donn and doff shirts independently.    Time 4    Period Weeks    Status On-going      OT SHORT TERM GOAL #3   Title Pt will increase LUE  strength to 3/5 or greater to improve ability to reach items at waist to chest height during ADLs.    Time 4    Period Weeks    Status On-going               OT Long Term Goals - 10/07/21 1017       OT LONG TERM GOAL #1   Title Pt will decrease LUE pain to 3/10 or less to improve ability to sleep in the bed instead of the recliner.    Time 8    Period Weeks    Status On-going      OT LONG TERM GOAL #2   Title Pt will increase LUE A/ROM to [facility] to improve ability to perform reaching tasks overhead and behind back during ADLs.    Time 8    Period Weeks    Status On-going      OT LONG TERM GOAL #3   Title Pt will decrease LUE fascial restrictions to min amounts or less to improve mobility required for functional reaching tasks.    Time 8    Period Weeks    Status On-going      OT LONG TERM GOAL #4   Title Pt will increase LUE strength to 4+/5 or greater to improve ability to perform housekeeping tasks requiring lifting or pulling/pushing.    Time 8    Period Weeks    Status On-going      OT LONG TERM GOAL #5   Title Pt will achieve highest level of functioning using LUE as non-dominant during ADL and leisure tasks.    Time 8    Period Weeks    Status On-going                   Plan - 10/17/21 1500     Clinical Impression Statement A: Continued with myofascial release to address fascial restrictions, P/ROM. Added isometrics in supine, continued with scapular A/ROM. Improvement noted in decreased trapezius activation during scapular A/ROM with only initial verbal reminder. Added thumb tacks, continued with therapy ball stretches. Verbal cuing for form and technique.    Body Structure / Function / Physical Skills ADL;Endurance;Muscle spasms;UE functional use;Fascial restriction;Pain;ROM;IADL;Strength  Plan P: continue with isometrics and and increase to 5x5"    OT Home Exercise Plan eval: table slides, elbow flexion/extension, forearm supination/pronation     Consulted and Agree with Plan of Care Patient             Patient will benefit from skilled therapeutic intervention in order to improve the following deficits and impairments:   Body Structure / Function / Physical Skills: ADL, Endurance, Muscle spasms, UE functional use, Fascial restriction, Pain, ROM, IADL, Strength       Visit Diagnosis: Acute pain of left shoulder  Stiffness of left shoulder, not elsewhere classified  Other symptoms and signs involving the musculoskeletal system    Problem List Patient Active Problem List   Diagnosis Date Noted   Glenohumeral arthritis, left 09/29/2021   Intolerance of continuous positive airway pressure (CPAP) ventilation 02/17/2021   Status post bariatric surgery 02/17/2021   Periodic limb movement disorder (PLMD) 02/17/2021   OSA (obstructive sleep apnea) 02/17/2021   RLS (restless legs syndrome) 01/22/2018   History of colonic polyps    Constipation 05/11/2016   Gait difficulty 01/04/2016   Aphasia 01/04/2016   Acute CVA (cerebrovascular accident) ([place]) 01/04/2016   CVA (cerebral infarction) 01/04/2016   Ataxia    OSA on CPAP 12/02/2013   CTS (carpal tunnel syndrome) 03/18/2013   Wrist fracture 02/04/2013   Distal radius fracture 12/26/2012   Hyperlipidemia 06/30/2012   Elevated blood pressure reading without diagnosis of hypertension 06/29/2012   Stroke ([place]) 06/28/2012   Hypothyroidism 06/28/2012   Anxiety 06/28/2012   Personal history of colonic polyps 04/26/2011   Family history of colonic polyps 04/26/2011   ARTHRITIS, LEFT KNEE 06/06/2010   DERANGEMENT OF POSTERIOR HORN OF MEDIAL MENISCUS 06/06/2010   ANEMIA, B12 DEFICIENCY 02/07/2010   TRIGGER FINGER, THUMB 02/07/2010   SHOULDER PAIN 10/14/2008   BURSITIS, SHOULDER 10/14/2008   TRIGGER FINGER 06/15/2008    Guadelupe Sabin, OTR/L  [phone] 10/17/2021, 3:12 PM  [place] [address] [place], [place],  [address] Phone: [phone]   Fax:  [phone]  Name: Virginia [name] MRN: [id number] Date of Birth: 01-Nov-1946

## 2021-10-19 ENCOUNTER — Other Ambulatory Visit: Payer: Self-pay

## 2021-10-19 ENCOUNTER — Ambulatory Visit (HOSPITAL_COMMUNITY): Payer: Medicare Other | Admitting: Occupational Therapy

## 2021-10-19 ENCOUNTER — Encounter (HOSPITAL_COMMUNITY): Payer: Self-pay | Admitting: Occupational Therapy

## 2021-10-19 DIAGNOSIS — M25612 Stiffness of left shoulder, not elsewhere classified: Secondary | ICD-10-CM

## 2021-10-19 DIAGNOSIS — R29898 Other symptoms and signs involving the musculoskeletal system: Secondary | ICD-10-CM | POA: Diagnosis not present

## 2021-10-19 DIAGNOSIS — M25512 Pain in left shoulder: Secondary | ICD-10-CM

## 2021-10-19 NOTE — Patient Instructions (Signed)
  Complete the following 2 a day. Hold for 5-10 seconds. Complete 3X each.   1) SHOULDER - ISOMETRIC FLEXION  Gently push your fist forward into a wall with your elbow bent.    2) SHOULDER - ISOMETRIC EXTENSION  Gently push your a bent elbow back into a wall.    3) SHOULDER - ISOMETRIC INTERNAL ROTATION   Gently press your hand into a wall using the palm side of your hand.  Maintain a bent elbow the entire time.        4) SHOULDER - ISOMETRIC ADDUCTION  Gently push your elbow into the side of your body.   5) SHOULDER - ISOMETRIC ABDUCTION  Gently push your elbow out to the side into a wall with your elbow bent.

## 2021-10-19 NOTE — Therapy (Signed)
[place] [address] [place], [place], [address] Phone: [phone]   Fax:  [phone]  Occupational Therapy Treatment  Patient Details  Name: Virginia [name] MRN: [id number] Date of Birth: 12/17/1945 Referring Provider (OT): Dr. Larena Glassman   Encounter Date: 10/19/2021   OT End of Session - 10/19/21 1023     Visit Number 5    Number of Visits 16    Date for OT Re-Evaluation 12/05/21   mini-reassessment 11/03/2021   Authorization Type 1) Medicare A & B 2) BCBS    Authorization Time Period BCBS visit limit 2    Authorization - Visit Number 5    Authorization - Number of Visits 75    Progress Note Due on Visit 10    OT Start Time [id number]    OT Stop Time 1026    OT Time Calculation (min) 39 min    Activity Tolerance Patient tolerated treatment well    Behavior During Therapy [facility] for tasks assessed/performed             Past Medical History:  Diagnosis Date   Anxiety    Depression    HTN (hypertension)    resolved with weight loss s/p gastric bypass   Hypothyroidism    Since at least 2007   Osteoporosis    RLS (restless legs syndrome)    Sleep apnea    Stroke [facility])    2013/February 2017    Past Surgical History:  Procedure Laterality Date   BREAST BIOPSY Right    benign   CATARACT EXTRACTION  2018   CHOLECYSTECTOMY     COLONOSCOPY  2004   internal hemorrhoids, left-sided diverticulae, splenic flexure polyp (58m), path unavailable at this time   COLONOSCOPY  04/2011   RMR: Internal and external hemorrhoids, rectal tubular adenoma removed, left-sided diverticulosis   COLONOSCOPY N/A 06/01/2016   Procedure: COLONOSCOPY;  Surgeon: RDaneil Dolin MD;  Location: [facility];  Service: Endoscopy;  Laterality: N/A;  0830   ESOPHAGOGASTRODUODENOSCOPY  2004   small hiatal hernia   GASTRIC ROUX-EN-Y  2006   REVERSE SHOULDER ARTHROPLASTY Left 09/29/2021   Procedure: LEFT REVERSE SHOULDER ARTHROPLASTY;  Surgeon: CMordecai Rasmussen MD;  Location: [facility] ORS;  Service: Orthopedics;  Laterality: Left;    There were no vitals filed for this visit.   Subjective Assessment - 10/19/21 0949     Subjective  S: It woke me up this morning.    Currently in Pain? Yes    Pain Score 2     Pain Location Shoulder    Pain Orientation Left    Pain Descriptors / Indicators Sore    Pain Type Acute pain    Pain Onset Yesterday    Pain Frequency Intermittent    Aggravating Factors  movement, sleeping wrong    Pain Relieving Factors pain medication    Effect of Pain on Daily Activities patient is unable to utilize her LUE for ADLs    Multiple Pain Sites No                [facility] OT Assessment - 10/19/21 0946       Assessment   Medical Diagnosis s/p left reverse TSA      Precautions   Precautions Shoulder    Type of Shoulder Precautions See protocol.    Shoulder Interventions Shoulder sling/immobilizer;Off for dressing/bathing/exercises  OT Treatments/Exercises (OP) - 10/19/21 0950       Exercises   Exercises Elbow;Shoulder      Shoulder Exercises: Supine   Protraction PROM;5 reps    Horizontal ABduction PROM;5 reps    External Rotation PROM;5 reps    Internal Rotation PROM;5 reps    Flexion PROM;5 reps    ABduction PROM;5 reps      Shoulder Exercises: Seated   Extension AROM;15 reps    Row AROM;15 reps      Shoulder Exercises: Therapy Ball   Flexion 15 reps    ABduction 15 reps      Shoulder Exercises: ROM/Strengthening   Thumb Tacks 1' low level    Prot/Ret//Elev/Dep 1' low level      Shoulder Exercises: Isometric Strengthening   Flexion 3X5"   standing   Extension 3X5"   standing   External Rotation 3X5"   standing   Internal Rotation 3X5"   standing   ABduction 3X5"   standing   ADduction 3X5"   standing     Manual Therapy   Manual Therapy Myofascial release    Manual therapy comments Manual therapy completed prior to exercises.    Myofascial Release  Myofascial release and manual stretching completed to left upper arm, upper trapezius, and scapularis region to decrease fascial restrictions and increase joint mobility in a pain free zone.                      OT Short Term Goals - 10/07/21 1017       OT SHORT TERM GOAL #1   Title Pt will be provided with and educated on HEP to improve LUE mobility required for use during ADLs.    Time 4    Period Weeks    Status On-going    Target Date 11/05/21      OT SHORT TERM GOAL #2   Title Pt will increase LUE P/ROM to improve ability to donn and doff shirts independently.    Time 4    Period Weeks    Status On-going      OT SHORT TERM GOAL #3   Title Pt will increase LUE strength to 3/5 or greater to improve ability to reach items at waist to chest height during ADLs.    Time 4    Period Weeks    Status On-going               OT Long Term Goals - 10/07/21 1017       OT LONG TERM GOAL #1   Title Pt will decrease LUE pain to 3/10 or less to improve ability to sleep in the bed instead of the recliner.    Time 8    Period Weeks    Status On-going      OT LONG TERM GOAL #2   Title Pt will increase LUE A/ROM to [facility] to improve ability to perform reaching tasks overhead and behind back during ADLs.    Time 8    Period Weeks    Status On-going      OT LONG TERM GOAL #3   Title Pt will decrease LUE fascial restrictions to min amounts or less to improve mobility required for functional reaching tasks.    Time 8    Period Weeks    Status On-going      OT LONG TERM GOAL #4   Title Pt will increase LUE strength to 4+/5 or greater to improve ability to  perform housekeeping tasks requiring lifting or pulling/pushing.    Time 8    Period Weeks    Status On-going      OT LONG TERM GOAL #5   Title Pt will achieve highest level of functioning using LUE as non-dominant during ADL and leisure tasks.    Time 8    Period Weeks    Status On-going                    Plan - 10/19/21 1021     Clinical Impression Statement A: Continued with myofascial release to address fascial restrictions, passive stretching. Continued with isometrics and progressed to standing, added to HEP. Pt continuing to improve with P/ROM tolerance, now demonstrating ROM at 50% or above with flexion and abduction, er continues to be limited. Increased scapular ROM and therapy ball repetitions to 15. Verbal cuing for form and technique.    Body Structure / Function / Physical Skills ADL;Endurance;Muscle spasms;UE functional use;Fascial restriction;Pain;ROM;IADL;Strength    Plan P: Follow up on HEP, continue with protocol    OT Home Exercise Plan eval: table slides, elbow flexion/extension, forearm supination/pronation    Consulted and Agree with Plan of Care Patient             Patient will benefit from skilled therapeutic intervention in order to improve the following deficits and impairments:   Body Structure / Function / Physical Skills: ADL, Endurance, Muscle spasms, UE functional use, Fascial restriction, Pain, ROM, IADL, Strength       Visit Diagnosis: Acute pain of left shoulder  Stiffness of left shoulder, not elsewhere classified  Other symptoms and signs involving the musculoskeletal system    Problem List Patient Active Problem List   Diagnosis Date Noted   Glenohumeral arthritis, left 09/29/2021   Intolerance of continuous positive airway pressure (CPAP) ventilation 02/17/2021   Status post bariatric surgery 02/17/2021   Periodic limb movement disorder (PLMD) 02/17/2021   OSA (obstructive sleep apnea) 02/17/2021   RLS (restless legs syndrome) 01/22/2018   History of colonic polyps    Constipation 05/11/2016   Gait difficulty 01/04/2016   Aphasia 01/04/2016   Acute CVA (cerebrovascular accident) ([place]) 01/04/2016   CVA (cerebral infarction) 01/04/2016   Ataxia    OSA on CPAP 12/02/2013   CTS (carpal tunnel syndrome) 03/18/2013   Wrist  fracture 02/04/2013   Distal radius fracture 12/26/2012   Hyperlipidemia 06/30/2012   Elevated blood pressure reading without diagnosis of hypertension 06/29/2012   Stroke ([place]) 06/28/2012   Hypothyroidism 06/28/2012   Anxiety 06/28/2012   Personal history of colonic polyps 04/26/2011   Family history of colonic polyps 04/26/2011   ARTHRITIS, LEFT KNEE 06/06/2010   DERANGEMENT OF POSTERIOR HORN OF MEDIAL MENISCUS 06/06/2010   ANEMIA, B12 DEFICIENCY 02/07/2010   TRIGGER FINGER, THUMB 02/07/2010   SHOULDER PAIN 10/14/2008   BURSITIS, SHOULDER 10/14/2008   TRIGGER FINGER 06/15/2008    Guadelupe Sabin, OTR/L  [phone] 10/19/2021, 10:27 AM  [place], [place], [address] Phone: [phone]   Fax:  [phone]  Name: Virginia [name] MRN: [id number] Date of Birth: 09/23/1946

## 2021-10-25 ENCOUNTER — Other Ambulatory Visit: Payer: Self-pay

## 2021-10-25 ENCOUNTER — Ambulatory Visit (HOSPITAL_COMMUNITY): Payer: Medicare Other

## 2021-10-25 ENCOUNTER — Encounter (HOSPITAL_COMMUNITY): Payer: Self-pay

## 2021-10-25 DIAGNOSIS — M25612 Stiffness of left shoulder, not elsewhere classified: Secondary | ICD-10-CM | POA: Diagnosis not present

## 2021-10-25 DIAGNOSIS — M25512 Pain in left shoulder: Secondary | ICD-10-CM | POA: Diagnosis not present

## 2021-10-25 DIAGNOSIS — R29898 Other symptoms and signs involving the musculoskeletal system: Secondary | ICD-10-CM | POA: Diagnosis not present

## 2021-10-25 NOTE — Therapy (Signed)
[facility] [address] [place], [place], [address] Phone: [phone]   Fax:  [phone]  Occupational Therapy Treatment  Patient Details  Name: Virginia [name] MRN: [id number] Date of Birth: 05-25-1946 Referring Provider (OT): Dr. Larena Glassman   Encounter Date: 10/25/2021   OT End of Session - 10/25/21 1603     Visit Number 6    Number of Visits 16    Date for OT Re-Evaluation 12/05/21   mini-reassessment 11/03/2021   Authorization Type 1) Medicare A & B 2) BCBS    Authorization Time Period BCBS visit limit 21    Authorization - Visit Number 6    Authorization - Number of Visits 75    Progress Note Due on Visit 10    OT Start Time 1515    OT Stop Time 4481    OT Time Calculation (min) 38 min    Activity Tolerance Patient tolerated treatment well    Behavior During Therapy WFL for tasks assessed/performed             Past Medical History:  Diagnosis Date   Anxiety    Depression    HTN (hypertension)    resolved with weight loss s/p gastric bypass   Hypothyroidism    Since at least 2007   Osteoporosis    RLS (restless legs syndrome)    Sleep apnea    Stroke [facility])    2013/February 2017    Past Surgical History:  Procedure Laterality Date   BREAST BIOPSY Right    benign   CATARACT EXTRACTION  2018   CHOLECYSTECTOMY     COLONOSCOPY  2004   internal hemorrhoids, left-sided diverticulae, splenic flexure polyp (40mm), path unavailable at this time   COLONOSCOPY  04/2011   RMR: Internal and external hemorrhoids, rectal tubular adenoma removed, left-sided diverticulosis   COLONOSCOPY N/A 06/01/2016   Procedure: COLONOSCOPY;  Surgeon: Daneil Dolin, MD;  [facility];  Service: Endoscopy;  Laterality: N/A;  0830   ESOPHAGOGASTRODUODENOSCOPY  2004   small hiatal hernia   GASTRIC ROUX-EN-Y  2006   REVERSE SHOULDER ARTHROPLASTY Left 09/29/2021   Procedure: LEFT REVERSE SHOULDER ARTHROPLASTY;  Surgeon: Mordecai Rasmussen, MD;  Location: [facility];  Service: Orthopedics;  Laterality: Left;    There were no vitals filed for this visit.   Subjective Assessment - 10/25/21 1520     Currently in Pain? Yes    Pain Score 2     Pain Location Shoulder    Pain Orientation Left    Pain Descriptors / Indicators Sore    Pain Type Acute pain    Pain Onset Yesterday    Pain Frequency Intermittent    Aggravating Factors  movement, sleeping wrong    Pain Relieving Factors pain medication    Effect of Pain on Daily Activities patient is unable to utilize her LUE for ADL tasks.    Multiple Pain Sites No                [facility] OT Assessment - 10/25/21 1548       Assessment   Medical Diagnosis s/p left reverse TSA      Precautions   Precautions Shoulder    Type of Shoulder Precautions See protocol.    Shoulder Interventions Shoulder sling/immobilizer;Off for dressing/bathing/exercises                      OT Treatments/Exercises (OP) - 10/25/21 1541  Exercises   Exercises Shoulder      Shoulder Exercises: Supine   Protraction PROM;5 reps    Horizontal ABduction PROM;5 reps    External Rotation PROM;5 reps    Internal Rotation PROM;5 reps    Flexion PROM;5 reps    ABduction PROM;5 reps      Shoulder Exercises: Seated   Other Seated Exercises scapular depression; 10X, A/ROM      Shoulder Exercises: Therapy Ball   Flexion 15 reps   2" hold at end stretch   ABduction 15 reps   2" hold at end stretch     Shoulder Exercises: Isometric Strengthening   Flexion Supine   3x10"   Extension Supine   3x10"   External Rotation Supine   3x10"   Internal Rotation Supine   3x10"   ABduction Supine   3x10"   ADduction Supine   3x10"     Manual Therapy   Manual Therapy Myofascial release    Manual therapy comments Manual therapy completed prior to exercises.    Myofascial Release Myofascial release and manual stretching completed to left upper arm, upper trapezius, and scapularis  region to decrease fascial restrictions and increase joint mobility in a pain free zone.                      OT Short Term Goals - 10/07/21 1017       OT SHORT TERM GOAL #1   Title Pt will be provided with and educated on HEP to improve LUE mobility required for use during ADLs.    Time 4    Period Weeks    Status On-going    Target Date 11/05/21      OT SHORT TERM GOAL #2   Title Pt will increase LUE P/ROM to improve ability to donn and doff shirts independently.    Time 4    Period Weeks    Status On-going      OT SHORT TERM GOAL #3   Title Pt will increase LUE strength to 3/5 or greater to improve ability to reach items at waist to chest height during ADLs.    Time 4    Period Weeks    Status On-going               OT Long Term Goals - 10/07/21 1017       OT LONG TERM GOAL #1   Title Pt will decrease LUE pain to 3/10 or less to improve ability to sleep in the bed instead of the recliner.    Time 8    Period Weeks    Status On-going      OT LONG TERM GOAL #2   Title Pt will increase LUE A/ROM to [facility] to improve ability to perform reaching tasks overhead and behind back during ADLs.    Time 8    Period Weeks    Status On-going      OT LONG TERM GOAL #3   Title Pt will decrease LUE fascial restrictions to min amounts or less to improve mobility required for functional reaching tasks.    Time 8    Period Weeks    Status On-going      OT LONG TERM GOAL #4   Title Pt will increase LUE strength to 4+/5 or greater to improve ability to perform housekeeping tasks requiring lifting or pulling/pushing.    Time 8    Period Weeks    Status On-going  OT LONG TERM GOAL #5   Title Pt will achieve highest level of functioning using LUE as non-dominant during ADL and leisure tasks.    Time 8    Period Weeks    Status On-going                   Plan - 10/25/21 1604     Clinical Impression Statement A: Myofascial release completed to  address fascial restrictions along the upper arm, and upper trapezius region. Abel to tolerate a functional range of passive flexion, horizontal abduction, and abduction. Pain is still present and limits tolerance with full P/ROM. Added scapular depression A/ROM and increased isometric hold time. VC for form and technique were provided.    Body Structure / Function / Physical Skills ADL;Endurance;Muscle spasms;UE functional use;Fascial restriction;Pain;ROM;IADL;Strength    Plan P: Progress to phase II.    Consulted and Agree with Plan of Care Patient             Patient will benefit from skilled therapeutic intervention in order to improve the following deficits and impairments:   Body Structure / Function / Physical Skills: ADL, Endurance, Muscle spasms, UE functional use, Fascial restriction, Pain, ROM, IADL, Strength       Visit Diagnosis: Acute pain of left shoulder  Stiffness of left shoulder, not elsewhere classified  Other symptoms and signs involving the musculoskeletal system    Problem List Patient Active Problem List   Diagnosis Date Noted   Glenohumeral arthritis, left 09/29/2021   Intolerance of continuous positive airway pressure (CPAP) ventilation 02/17/2021   Status post bariatric surgery 02/17/2021   Periodic limb movement disorder (PLMD) 02/17/2021   OSA (obstructive sleep apnea) 02/17/2021   RLS (restless legs syndrome) 01/22/2018   History of colonic polyps    Constipation 05/11/2016   Gait difficulty 01/04/2016   Aphasia 01/04/2016   Acute CVA (cerebrovascular accident) ([place]) 01/04/2016   CVA (cerebral infarction) 01/04/2016   Ataxia    OSA on CPAP 12/02/2013   CTS (carpal tunnel syndrome) 03/18/2013   Wrist fracture 02/04/2013   Distal radius fracture 12/26/2012   Hyperlipidemia 06/30/2012   Elevated blood pressure reading without diagnosis of hypertension 06/29/2012   Stroke ([place]) 06/28/2012   Hypothyroidism 06/28/2012   Anxiety 06/28/2012    Personal history of colonic polyps 04/26/2011   Family history of colonic polyps 04/26/2011   ARTHRITIS, LEFT KNEE 06/06/2010   DERANGEMENT OF POSTERIOR HORN OF MEDIAL MENISCUS 06/06/2010   ANEMIA, B12 DEFICIENCY 02/07/2010   TRIGGER FINGER, THUMB 02/07/2010   SHOULDER PAIN 10/14/2008   BURSITIS, SHOULDER 10/14/2008   TRIGGER FINGER 06/15/2008    Virginia [name], Virginia [name],Virginia [name]  [phone]  10/25/2021, 4:06 PM  [facility] [place], [place], [address] Phone: [phone]   Fax:  [phone]  Name: Virginia [name] MRN: [id number] Date of Birth: 1945-12-23

## 2021-10-27 ENCOUNTER — Ambulatory Visit (HOSPITAL_COMMUNITY): Payer: Medicare Other | Attending: Orthopedic Surgery

## 2021-10-27 ENCOUNTER — Other Ambulatory Visit: Payer: Self-pay

## 2021-10-27 ENCOUNTER — Encounter (HOSPITAL_COMMUNITY): Payer: Self-pay

## 2021-10-27 DIAGNOSIS — M25612 Stiffness of left shoulder, not elsewhere classified: Secondary | ICD-10-CM | POA: Insufficient documentation

## 2021-10-27 DIAGNOSIS — R29898 Other symptoms and signs involving the musculoskeletal system: Secondary | ICD-10-CM | POA: Insufficient documentation

## 2021-10-27 DIAGNOSIS — M25512 Pain in left shoulder: Secondary | ICD-10-CM | POA: Diagnosis not present

## 2021-10-27 NOTE — Therapy (Signed)
[facility] [address] [place], [place], [address] Phone: [phone]   Fax:  [phone]  Occupational Therapy Treatment  Patient Details  Name: Virginia [name] MRN: [id number] Date of Birth: 09/18/1946 Referring Provider (OT): Dr. Larena Glassman   Encounter Date: 10/27/2021   OT End of Session - 10/27/21 1532     Visit Number 7    Number of Visits 16    Date for OT Re-Evaluation 12/05/21   mini-reassessment 11/03/2021   Authorization Type 1) Medicare A & B 2) BCBS    Authorization Time Period BCBS visit limit 41    Authorization - Visit Number 7    Authorization - Number of Visits 75    Progress Note Due on Visit 10    OT Start Time 1430    OT Stop Time 2376    OT Time Calculation (min) 38 min    Activity Tolerance Patient tolerated treatment well    Behavior During Therapy WFL for tasks assessed/performed             Past Medical History:  Diagnosis Date   Anxiety    Depression    HTN (hypertension)    resolved with weight loss s/p gastric bypass   Hypothyroidism    Since at least 2007   Osteoporosis    RLS (restless legs syndrome)    Sleep apnea    Stroke [facility])    2013/February 2017    Past Surgical History:  Procedure Laterality Date   BREAST BIOPSY Right    benign   CATARACT EXTRACTION  2018   CHOLECYSTECTOMY     COLONOSCOPY  2004   internal hemorrhoids, left-sided diverticulae, splenic flexure polyp (71mm), path unavailable at this time   COLONOSCOPY  04/2011   RMR: Internal and external hemorrhoids, rectal tubular adenoma removed, left-sided diverticulosis   COLONOSCOPY N/A 06/01/2016   Procedure: COLONOSCOPY;  Surgeon: Daneil Dolin, MD;  Location: [facility];  Service: Endoscopy;  Laterality: N/A;  0830   ESOPHAGOGASTRODUODENOSCOPY  2004   small hiatal hernia   GASTRIC ROUX-EN-Y  2006   REVERSE SHOULDER ARTHROPLASTY Left 09/29/2021   Procedure: LEFT REVERSE SHOULDER ARTHROPLASTY;  Surgeon: Mordecai Rasmussen, MD;  Location: [facility];  Service: Orthopedics;  Laterality: Left;    There were no vitals filed for this visit.   Subjective Assessment - 10/27/21 1436     Subjective  S: Whatever you did last time with the stretch out to the side (horizontal abduction), really helped.    Currently in Pain? Yes    Pain Score 2     Pain Location Shoulder    Pain Orientation Left    Pain Descriptors / Indicators Sore    Pain Type Acute pain    Pain Onset Yesterday    Pain Frequency Intermittent    Aggravating Factors  movement, sleeping wrong    Pain Relieving Factors pain medication    Effect of Pain on Daily Activities Max effect    Multiple Pain Sites No                [facility] OT Assessment - 10/27/21 1456       Assessment   Medical Diagnosis s/p left reverse TSA      Precautions   Precautions Shoulder    Type of Shoulder Precautions See protocol.                      OT Treatments/Exercises (OP) -  10/27/21 1456       Exercises   Exercises Shoulder      Shoulder Exercises: Supine   Protraction PROM;5 reps;AAROM;10 reps    Horizontal ABduction PROM;5 reps;AAROM;10 reps    External Rotation PROM;5 reps;AAROM;10 reps    Internal Rotation PROM;5 reps;AAROM;10 reps    Flexion PROM;5 reps;AAROM;10 reps    ABduction PROM;5 reps      Shoulder Exercises: ROM/Strengthening   Wall Wash 1'    Thumb Tacks 1' low level      Manual Therapy   Manual Therapy Myofascial release    Manual therapy comments Manual therapy completed prior to exercises.    Myofascial Release Myofascial release and manual stretching completed to left upper arm, upper trapezius, and scapularis region to decrease fascial restrictions and increase joint mobility in a pain free zone.                    OT Education - 10/27/21 1531     Education Details supine AA/ROM protraction, flexion, IR/er, horizontal abduction. May continue with isometrics. May stop table slides/HEP from initial  evaluation. Begin scar massage to healed incision to break up scar tissue. Use Vitamin E oil on incision.    Person(s) Educated Patient    Methods Explanation;Handout;Demonstration;Verbal cues;Tactile cues    Comprehension Returned demonstration;Verbalized understanding              OT Short Term Goals - 10/07/21 1017       OT SHORT TERM GOAL #1   Title Pt will be provided with and educated on HEP to improve LUE mobility required for use during ADLs.    Time 4    Period Weeks    Status On-going    Target Date 11/05/21      OT SHORT TERM GOAL #2   Title Pt will increase LUE P/ROM to improve ability to donn and doff shirts independently.    Time 4    Period Weeks    Status On-going      OT SHORT TERM GOAL #3   Title Pt will increase LUE strength to 3/5 or greater to improve ability to reach items at waist to chest height during ADLs.    Time 4    Period Weeks    Status On-going               OT Long Term Goals - 10/07/21 1017       OT LONG TERM GOAL #1   Title Pt will decrease LUE pain to 3/10 or less to improve ability to sleep in the bed instead of the recliner.    Time 8    Period Weeks    Status On-going      OT LONG TERM GOAL #2   Title Pt will increase LUE A/ROM to [facility] to improve ability to perform reaching tasks overhead and behind back during ADLs.    Time 8    Period Weeks    Status On-going      OT LONG TERM GOAL #3   Title Pt will decrease LUE fascial restrictions to min amounts or less to improve mobility required for functional reaching tasks.    Time 8    Period Weeks    Status On-going      OT LONG TERM GOAL #4   Title Pt will increase LUE strength to 4+/5 or greater to improve ability to perform housekeeping tasks requiring lifting or pulling/pushing.    Time 8  Period Weeks    Status On-going      OT LONG TERM GOAL #5   Title Pt will achieve highest level of functioning using LUE as non-dominant during ADL and leisure tasks.     Time 8    Period Weeks    Status On-going                   Plan - 10/27/21 1536     Clinical Impression Statement A: Continued with myofascial release to left upper arm and upper trapezius region. patient reports decreased muscle tightness since last session after stretching. Continues to voice discomfort with horizontal adduction passive. Progressed to supine AA/ROM for all shoulder movements except abduction. Updated HEP. VC for form and technique were provided.    Body Structure / Function / Physical Skills ADL;Endurance;Muscle spasms;UE functional use;Fascial restriction;Pain;ROM;IADL;Strength    Plan P: Add pulleys and PVC pipe slide.    OT Home Exercise Plan eval: table slides, elbow flexion/extension, forearm supination/pronation 12/1: AA/ROM supine.    Consulted and Agree with Plan of Care Patient             Patient will benefit from skilled therapeutic intervention in order to improve the following deficits and impairments:   Body Structure / Function / Physical Skills: ADL, Endurance, Muscle spasms, UE functional use, Fascial restriction, Pain, ROM, IADL, Strength       Visit Diagnosis: Acute pain of left shoulder  Stiffness of left shoulder, not elsewhere classified  Other symptoms and signs involving the musculoskeletal system    Problem List Patient Active Problem List   Diagnosis Date Noted   Glenohumeral arthritis, left 09/29/2021   Intolerance of continuous positive airway pressure (CPAP) ventilation 02/17/2021   Status post bariatric surgery 02/17/2021   Periodic limb movement disorder (PLMD) 02/17/2021   OSA (obstructive sleep apnea) 02/17/2021   RLS (restless legs syndrome) 01/22/2018   History of colonic polyps    Constipation 05/11/2016   Gait difficulty 01/04/2016   Aphasia 01/04/2016   Acute CVA (cerebrovascular accident) ([place]) 01/04/2016   CVA (cerebral infarction) 01/04/2016   Ataxia    OSA on CPAP 12/02/2013   CTS (carpal tunnel  syndrome) 03/18/2013   Wrist fracture 02/04/2013   Distal radius fracture 12/26/2012   Hyperlipidemia 06/30/2012   Elevated blood pressure reading without diagnosis of hypertension 06/29/2012   Stroke ([place]) 06/28/2012   Hypothyroidism 06/28/2012   Anxiety 06/28/2012   Personal history of colonic polyps 04/26/2011   Family history of colonic polyps 04/26/2011   ARTHRITIS, LEFT KNEE 06/06/2010   DERANGEMENT OF POSTERIOR HORN OF MEDIAL MENISCUS 06/06/2010   ANEMIA, B12 DEFICIENCY 02/07/2010   TRIGGER FINGER, THUMB 02/07/2010   SHOULDER PAIN 10/14/2008   BURSITIS, SHOULDER 10/14/2008   TRIGGER FINGER 06/15/2008    Ailene Ravel, OTR/L,CBIS  [phone]  10/27/2021, 3:42 PM  [place], [place], [address] Phone: [phone]   Fax:  [phone]  Name: Virginia [name] MRN: [id number] Date of Birth: 1946/06/24

## 2021-10-27 NOTE — Discharge Summary (Signed)
Patient ID: Virginia [name] MRN: [id number] DOB/AGE: Jun 18, 1946 75 y.o.  Admit date: 09/29/2021 Discharge date: 10/27/2021  Admission Diagnoses: Left glenohumeral arthritis  Discharge Diagnoses:  Active Problems:   Glenohumeral arthritis, left   Past Medical History:  Diagnosis Date   Anxiety    Depression    HTN (hypertension)    resolved with weight loss s/p gastric bypass   Hypothyroidism    Since at least 2007   Osteoporosis    RLS (restless legs syndrome)    Sleep apnea    Stroke [facility])    2013/February 2017     Procedures Performed: Left reverse shoulder arthroplasty  Discharged Condition: good  Hospital Course: Patient brought in as an outpatient for surgery.  Tolerated procedure well.  Was discharged from the PACU immediately following the surgery.  Patient was not admitted for observation.   Consults: None  Significant Diagnostic Studies: No additional pertinent studies  Treatments: Surgery  Discharge Exam:  Per anesthesia, patient recovered sufficiently for discharge from the PACU  Disposition: good   Allergies as of 09/29/2021       Reactions   Augmentin [amoxicillin-pot Clavulanate] Anaphylaxis   Amoxicillin-pot Clavulanate Hives, Rash   Has patient had a PCN reaction causing immediate rash, facial/tongue/throat swelling, SOB or lightheadedness with hypotension:no Has patient had a PCN reaction causing severe rash involving mucus membranes or skin necrosis:no Augmentin         Medication List     TAKE these medications    ALPRAZolam 0.5 MG tablet Commonly known as: XANAX Take 0.5 mg by mouth at bedtime. Pt can take up to 3 tabs daily   amLODipine 2.5 MG tablet Commonly known as: NORVASC Take 2.5 mg by mouth daily.   aspirin EC 81 MG tablet Take 81 mg by mouth daily.   atorvastatin 10 MG tablet Commonly known as: LIPITOR Take 1 tablet (10 mg total) by mouth daily. What changed: when to take this   b complex vitamins  tablet Take 1 tablet by mouth daily.   docusate sodium 100 MG capsule Commonly known as: COLACE Take 100 mg by mouth daily as needed for mild constipation.   DULoxetine 60 MG capsule Commonly known as: CYMBALTA Take 60 mg by mouth daily.   fluocinonide ointment 0.05 % Commonly known as: LIDEX Apply 1 application topically 2 (two) times daily as needed (irritation).   ICAPS AREDS 2 PO Take 1 capsule by mouth in the morning and at bedtime.   multivitamin with minerals tablet Take 1 tablet by mouth daily.   losartan 100 MG tablet Commonly known as: COZAAR TAKE 1 TABLET BY MOUTH EVERY DAY   rOPINIRole 0.5 MG tablet Commonly known as: Requip Take 1 tablet (0.5 mg total) by mouth at bedtime. 1 tab in Pm and one at bedtime po What changed: additional instructions   Synthroid 150 MCG tablet Generic drug: levothyroxine Take 150 mcg by mouth daily before breakfast.   vitamin C 1000 MG tablet Take 1,000 mg by mouth daily.   Vitamin D-3 25 MCG (1000 UT) Caps Take 1,000 Units by mouth daily.       ASK your doctor about these medications    acetaminophen 500 MG tablet Commonly known as: TYLENOL Take 2 tablets (1,000 mg total) by mouth every 8 (eight) hours for 14 days. Ask about: Should I take this medication?   celecoxib 100 MG capsule Commonly known as: CeleBREX Take 1 capsule (100 mg total) by mouth daily for 14 days. Ask about: Should  I take this medication?   ondansetron 4 MG tablet Commonly known as: Zofran Take 1 tablet (4 mg total) by mouth every 8 (eight) hours as needed for up to 14 days for nausea or vomiting. Ask about: Should I take this medication?   oxyCODONE 5 MG immediate release tablet Commonly known as: Roxicodone Take 1 tablet (5 mg total) by mouth every 4 (four) hours as needed for up to 7 days. Ask about: Should I take this medication?        Follow-up Information     Mordecai Rasmussen, MD. Schedule an appointment as soon as possible for a  visit.   Specialties: Orthopedic Surgery, Sports Medicine Why: 10-14 days for suture removal/incision check Contact information: [address] [place] [address] [phone]

## 2021-10-27 NOTE — Patient Instructions (Signed)
Perform each exercise ____10-15____ reps. 21-2x days.   1) Protraction   Start by holding a wand or cane at chest height.  Next, slowly push the wand outwards in front of your body so that your elbows become fully straightened. Then, return to the original position.     2) Shoulder FLEXION   In the standing position, hold a wand/cane with both arms, palms down on both sides. Raise up the wand/cane allowing your unaffected arm to perform most of the effort. Your affected arm should be partially relaxed.      3) Internal/External ROTATION   In the standing position, hold a wand/cane with both hands keeping your elbows bent. Move your arms and wand/cane to one side.  Your affected arm should be partially relaxed while your unaffected arm performs most of the effort.         5) Horizontal Abduction/Adduction      Straight arms holding cane at shoulder height, bring cane to right, center, left. Repeat starting to left.

## 2021-11-01 ENCOUNTER — Ambulatory Visit (INDEPENDENT_AMBULATORY_CARE_PROVIDER_SITE_OTHER): Payer: Medicare Other | Admitting: Neurology

## 2021-11-01 ENCOUNTER — Ambulatory Visit (HOSPITAL_COMMUNITY): Payer: Medicare Other | Admitting: Occupational Therapy

## 2021-11-01 ENCOUNTER — Other Ambulatory Visit: Payer: Self-pay

## 2021-11-01 ENCOUNTER — Encounter: Payer: Self-pay | Admitting: Neurology

## 2021-11-01 ENCOUNTER — Other Ambulatory Visit: Payer: Self-pay | Admitting: Neurology

## 2021-11-01 ENCOUNTER — Encounter (HOSPITAL_COMMUNITY): Payer: Self-pay | Admitting: Occupational Therapy

## 2021-11-01 VITALS — BP 120/70 | HR 84 | Ht 63.0 in | Wt 192.0 lb

## 2021-11-01 DIAGNOSIS — G2581 Restless legs syndrome: Secondary | ICD-10-CM | POA: Diagnosis not present

## 2021-11-01 DIAGNOSIS — M25612 Stiffness of left shoulder, not elsewhere classified: Secondary | ICD-10-CM

## 2021-11-01 DIAGNOSIS — E039 Hypothyroidism, unspecified: Secondary | ICD-10-CM

## 2021-11-01 DIAGNOSIS — G4733 Obstructive sleep apnea (adult) (pediatric): Secondary | ICD-10-CM | POA: Diagnosis not present

## 2021-11-01 DIAGNOSIS — M25512 Pain in left shoulder: Secondary | ICD-10-CM

## 2021-11-01 DIAGNOSIS — Z9989 Dependence on other enabling machines and devices: Secondary | ICD-10-CM | POA: Diagnosis not present

## 2021-11-01 DIAGNOSIS — I639 Cerebral infarction, unspecified: Secondary | ICD-10-CM

## 2021-11-01 DIAGNOSIS — R29898 Other symptoms and signs involving the musculoskeletal system: Secondary | ICD-10-CM | POA: Diagnosis not present

## 2021-11-01 MED ORDER — ROPINIROLE HCL 0.5 MG PO TABS: 0.5000 mg | ORAL_TABLET | Freq: Every day | ORAL | 2 refills | Status: DC

## 2021-11-01 NOTE — Progress Notes (Signed)
SLEEP MEDICINE CLINIC    Provider:  Larey Seat, MD  Primary Care Physician:  Redmond [facility], [place] [address]     Referring Provider:         Chief Complaint according to patient   Patient presents with:     New Patient (Initial Visit)           INTERVAL HISTORY OF PRESENT ILLNESS:  Virginia [name] is a 75 - year old White or Caucasian female patient who underwent new sleep studies this year- The patient underwent first a baseline polysomnography to recent qualify for a new CPAP machine and she was referred by Dr. Floyde Parkins and [facility].  She has undergone a gastric bypass surgery in the in the past had felt that with her weight loss for a while there was no need for CPAP therapy. Had RLS. Had a CVA.   The repeat study showed a remaining AHI of 14.1 which constitutes mild apnea she was moderately to loud snoring her REM AHI was 56/h which is clearly REM dependent sleep apnea and would not be treatable by an inspire device.  In supine sleep however she also had much higher apneas and hypopneas then in nonsupine sleep and so the position will also affect the outcome of her sleep study.  She did have obstructive sleep apnea, upper airway resistance syndrome-snoring and hypoxemia I asked her to return for an in lab titration which pulse was taken place on 03-24-2021.  The technician used a ResMed fullface mask the F 30 I at 5 cmH2O with heated heated humidity and pressure was only advanced to 8 cm water for the apnea hypopnea index was resolved with only 0.9/h remaining.  There was no significant hypoxia noted so we ordered a 8 cmH2O coverage and a full facemask F 30.  The patient now reports that with her new machine she does not have the feeling of the mask forming a section and it is easier displaced or dislodged also she does not really want a ramp function she wants to feel the air as soon as the mask is placed.  So  I looked at the download from her  Resvent machine and she has used the machine 80% of the time but the problem for her is that she cannot feel the air flowing as she needs to feel comfortable with.  Set up date was 09-08-2021 95th percentile pressure is 8.2 cmH2O, air leak at the 95th percentile is extremely high at 67 L/min, and the patient has a residual AHI of 7.3.  So what I am going to do today is #1 the patient would like to return to a ResMed machine we will try to do that.  I will discontinue any kind of ramp function as she wants to feel airflow.  The pressure range will be set between 6 and 12 cmH2O .FFM F 30 will be used.    Has Requip with better control of RLS. Some anticipation.  Thyroid disease related ? Iron saturation related/ .        I have the pleasure of seeing Virginia [name] 12-29-2018, a right-handed  Caucasian female with a History of OSA sleep disorder.  She  has a past medical history of Anxiety, Depression, HTN (hypertension), Hypothyroidism, Osteoporosis, RLS (restless legs syndrome), Sleep apnea, and Stroke ([place]). she is a post-gastric bypass patient,  she was non complaint with CPAP therapy and had discontinued the therapy. She was  treated last year for anemia and thyroiditis. Mentioning of hearing loss and macular edema.  Followed here until 2015, several  years ago and now here upon a new referral on 11/01/2021 from her new PCP in [place], [place].   The patient had her first sleep study with [place] in the year 2013 -but previous sleep studies dated to 2006.She was referred by dr Floyde Parkins, MD at [facility] at the time.  The 2013 study ended with a result of an AHI ( Apnea Hypopnea index)  of 17/h, no hypoxemia was noted and frequent PLMs were present.  Under CPAP , the PLMs resolved, but central apnea emerged. The best tolerated pressure was only 6 cm water.      Sleep relevant medical history: Restless leg syndrome, obstructive sleep apnea currently untreated, generalized anxiety disorder, moderate depression,  stroke ischemic, and impaired glucose tolerance.  Her new primary care physician has provided alprazolam 0.5 mg when necessary for severe anxiety but also voiced concern about the untreated obstructive sleep apnea. The patient reports severe fatigue. Her hypertension and glucose level seems to be well controlled currently.   She did undergo a gastric bypass surgery and lost 100 pounds the symptoms seem to resolve with weight loss and therefore she stopped using the CPAP therapy but then 3 years ago she had another in-home study not provided through this office and she questions the validity of the study.  She would like to have the sleep study repeated in the proper lab    This home sleep test quoted here was performed on 5-15 2019 and revealed an AHI of 16.8 very close to the baseline of 2013.  She had no significant central apnea proportion.  Obstructive events were 227 of which 40 were obstructive apneas, she did not have significant hypoxemia.  The RDI was 46.7 indicating loud snoring.  The study was performed through [facility] snap [email or web address].  It had been ordered by Redmond [facility].      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  Adult daughter with breast cancer , adult grandchildren.  The patient currently works( tax season ).   Sleep habits are as follows: [name] dinner time is between 6 PM. The patient goes to bed at 11 PM and continues to sleep for 5-6 hours, wakes rarely.    The preferred sleep position is laterally, with the support of 1 pillow. Dreams are reportedly infrequent..  6.30  AM is the usual rise time. The patient wakes up with an alarm.  She reports not feeling refreshed or restored in AM, with symptoms such as dry mouth, some morning headaches and residual fatigue.   Review of Systems: Out of a complete 14 system review, the patient complains of only the following symptoms, and all other reviewed  systems are negative.:  Fatigue, sleepiness , snoring, non restorative sleep, achiness,    How likely are you to doze in the following situations: 0 = not likely, 1 = slight chance, 2 = moderate chance, 3 = high chance   Sitting and Reading? Watching Television? Sitting inactive in a public place (theater or meeting)? As a passenger in a car for an hour without a break? Lying down in the afternoon when circumstances permit? Sitting and talking to someone? Sitting quietly after lunch without alcohol? In a car, while stopped for a few minutes in traffic?   Total = 11/ 24 points   FSS  endorsed at 54/ 63 points.   Social History   Socioeconomic History   Marital status: Divorced    Spouse name: Not on file   Number of children: 1   Years of education: college   Highest education level: Not on file  Occupational History   Occupation: retired     Fish farm manager: RETIRED  Tobacco Use   Smoking status: Former    Types: Cigarettes    Quit date: 11/28/1987    Years since quitting: 33.9   Smokeless tobacco: Never   Tobacco comments:    Quit x 25 plus years  Vaping Use   Vaping Use: Never used  Substance and Sexual Activity   Alcohol use: No    Alcohol/week: 0.0 standard drinks   Drug use: No   Sexual activity: Not on file  Other Topics Concern   Not on file  Social History Narrative   Caffeine 6 cups daily.  Retired/[employer],   2 yrs college,  Divorced, one daughter.   Social Determinants of Health   Financial Resource Strain: Not on file  Food Insecurity: Not on file  Transportation Needs: Not on file  Physical Activity: Not on file  Stress: Not on file  Social Connections: Not on file    Family History  Problem Relation Age of Onset   Lung cancer Father    Stroke Father    Stroke Mother    Stroke Brother    Hypertension Brother    Stroke Maternal Grandmother    Stroke Maternal Grandfather    Cancer Other        family history    Coronary artery disease Other         family history    Arthritis Other        family history    Cancer Daughter     Past Medical History:  Diagnosis Date   Anxiety    Depression    HTN (hypertension)    resolved with weight loss s/p gastric bypass   Hypothyroidism    Since at least 2007   Osteoporosis    RLS (restless legs syndrome)    Sleep apnea    Stroke [facility])    2013/February 2017    Past Surgical History:  Procedure Laterality Date   BREAST BIOPSY Right    benign   CATARACT EXTRACTION  2018   CHOLECYSTECTOMY     COLONOSCOPY  2004   internal hemorrhoids, left-sided diverticulae, splenic flexure polyp (69mm), path unavailable at this time   COLONOSCOPY  04/2011   RMR: Internal and external hemorrhoids, rectal tubular adenoma removed, left-sided diverticulosis   COLONOSCOPY N/A 06/01/2016   Procedure: COLONOSCOPY;  Surgeon: Daneil Dolin, MD;  Location[facility];  Service: Endoscopy;  Laterality: N/A;  0830   ESOPHAGOGASTRODUODENOSCOPY  2004   small hiatal hernia   GASTRIC ROUX-EN-Y  2006   REVERSE SHOULDER ARTHROPLASTY Left 09/29/2021   Procedure: LEFT REVERSE SHOULDER ARTHROPLASTY;  Surgeon: Mordecai Rasmussen, MD;  Location: [facility] ORS;  Service: Orthopedics;  Laterality: Left;     Current Outpatient Medications on File Prior to Visit  Medication Sig Dispense Refill   ALPRAZolam (XANAX) 0.5 MG tablet Take 0.5 mg by mouth at bedtime. Pt can take up to 3 tabs daily     amLODipine (NORVASC) 2.5 MG tablet Take 2.5 mg by mouth daily.     Ascorbic Acid (VITAMIN C) 1000 MG tablet Take 1,000 mg by mouth daily.     aspirin EC 81 MG  tablet Take 81 mg by mouth daily.     atorvastatin (LIPITOR) 10 MG tablet Take 1 tablet (10 mg total) by mouth daily. (Patient taking differently: Take 10 mg by mouth every evening.) 30 tablet 1   b complex vitamins tablet Take 1 tablet by mouth daily.     celecoxib (CELEBREX) 100 MG capsule Take 1 capsule (100 mg total) by mouth 2 (two) times daily as needed. 30 capsule 0    Cholecalciferol (VITAMIN D-3) 1000 UNITS CAPS Take 1,000 Units by mouth daily.     docusate sodium (COLACE) 100 MG capsule Take 100 mg by mouth daily as needed for mild constipation.     DULoxetine (CYMBALTA) 60 MG capsule Take 60 mg by mouth daily.     fluocinonide ointment (LIDEX) 7.07 % Apply 1 application topically 2 (two) times daily as needed (irritation).     losartan (COZAAR) 100 MG tablet TAKE 1 TABLET BY MOUTH EVERY DAY (Patient taking differently: Take 100 mg by mouth daily.) 30 tablet 2   Multiple Vitamins-Minerals (ICAPS AREDS 2 PO) Take 1 capsule by mouth in the morning and at bedtime.     Multiple Vitamins-Minerals (MULTIVITAMIN WITH MINERALS) tablet Take 1 tablet by mouth daily.     rOPINIRole (REQUIP) 0.5 MG tablet Take 1 tablet (0.5 mg total) by mouth at bedtime. 1 tab in Pm and one at bedtime po (Patient taking differently: Take 0.5 mg by mouth at bedtime.) 90 tablet 2   SYNTHROID 150 MCG tablet Take 150 mcg by mouth daily before breakfast.     No current facility-administered medications on file prior to visit.    Allergies  Allergen Reactions   Augmentin [Amoxicillin-Pot Clavulanate] Anaphylaxis   Amoxicillin-Pot Clavulanate Hives and Rash    Has patient had a PCN reaction causing immediate rash, facial/tongue/throat swelling, SOB or lightheadedness with hypotension:no Has patient had a PCN reaction causing severe rash involving mucus membranes or skin necrosis:no  Augmentin      Physical exam:  Today's Vitals   11/01/21 1446  BP: 120/70  Pulse: 84  Weight: 192 lb (87.1 kg)  Height: 5\' 3"  (1.6 m)   Body mass index is 34.01 kg/m.   Wt Readings from Last 3 Encounters:  11/01/21 192 lb (87.1 kg)  09/27/21 190 lb (86.2 kg)  07/22/21 191 lb (86.6 kg)     Ht Readings from Last 3 Encounters:  11/01/21 5\' 3"  (1.6 m)  09/27/21 5\' 3"  (1.6 m)  07/22/21 5\' 3"  (1.6 m)      General: The patient is awake, alert and appears not in acute distress. The patient is  well groomed. Head: Normocephalic, atraumatic  Neck is supple. Mallampati 1 , short and steep angle but not a small airway neck circumference:14.3/4 inches . Nasal airflow patent.  Cardiovascular:  Regular rate and cardiac rhythm by pulse,  without distended neck veins. Respiratory: Lungs are clear to auscultation.  Skin:  Without evidence of ankle edema, or rash. Trunk: [name] posture is erect.   Neurologic exam : The patient is awake and alert, oriented to place and time.   Memory subjective described as intact.  Attention span & concentration ability appears normal.  Speech is fluent,  without  dysarthria, dysphonia or aphasia.  Mood and affect are appropriate.   Cranial nerves: no loss of smell or taste reported  Pupils are equal and briskly reactive to light. Funduscopic exam deferred.   Extraocular movements in vertical and horizontal planes were intact and without nystagmus. No  Diplopia. Visual fields by finger perimetry are intact. Hearing was intact to soft voice and finger rubbing.    Facial sensation intact to fine touch.  Facial motor strength is symmetric and tongue and uvula move midline.  Neck ROM : rotation, tilt and flexion extension were normal for age and shoulder shrug was symmetrical.    Motor exam:  Symmetric bulk, tone and ROM.   Normal tone without cog wheeling, and with symmetric grip strength .   Sensory: Proprioception tested in the upper extremities was normal.   Coordination: Rapid alternating movements in the fingers/hands were of normal speed.  The Finger-to-nose maneuver was intact .   Gait and station: Patient could rise unassisted from a seated position, walked without assistive device.  Stance is of normal width/ base and the patient turned with 3 steps.  Toe and heel walk were deferred.  Deep tendon reflexes: in the  upper and lower extremities are symmetric and intact.  Babinski response was deferred .        After spending a total  time of 35  minutes face to face and additional time for physical and neurologic examination, review of laboratory studies,  personal review of imaging studies, reports and results of other testing and review of referral information / records as far as provided in visit, I have established the following assessments:  1) current BMI is 34- loud snoring is still present, the HST 2019 recealed a similar AHI as was noted the in lab test 2013. Not a INSPIRE candidate.   AHI was mild OSA, and she is now with a new CPAP but doesn't like it, I will take off the RAMP, set to 6-12 cm water, 2 cm EPR, and new FFM F 30 - not F 30 I.   3) still high degree of fatigue- 54 out of 63 points.     I would like to thank Redmond School, MD , [facility] , [place] for allowing me to meet with and to take care of this pleasant patient.   In short, Virginia [name] is presenting with continuously snoring and moderate sleep apnea last noted in 2019, now wanting to see about alternatives to OSA treatment by CPAP.  I plan to follow up either personally or through our NP within 3-49month.   CC: I will share my notes with [place], [place] , [place].   Electronically signed by: Larey Seat, MD 11/01/2021 2:53 PM  [facility] [employer] Board certified by The [employer] of Sleep Medicine and Diplomate of the [employer] of Sleep Medicine. Board certified In Neurology through the [place], Fellow of the [employer] of Neurology. Medical Director of [employer].

## 2021-11-01 NOTE — Therapy (Signed)
[place], [place], [address] Phone: [phone]   Fax:  [phone]  Occupational Therapy Treatment  Patient Details  Name: Virginia [name] MRN: [id number] Date of Birth: 03-25-1946 Referring Provider (OT): Dr. Larena Glassman   Encounter Date: 11/01/2021   OT End of Session - 11/01/21 1338     Visit Number 8    Number of Visits 16    Date for OT Re-Evaluation 12/05/21   mini-reassessment 11/03/2021   Authorization Type 1) Medicare A & B 2) BCBS    Authorization Time Period BCBS visit limit 87    Authorization - Visit Number 8    Authorization - Number of Visits 75    Progress Note Due on Visit 10    OT Start Time 1305    OT Stop Time 1330   pt requested to leave early to get to next appt   OT Time Calculation (min) 25 min    Activity Tolerance Patient tolerated treatment well    Behavior During Therapy [facility] for tasks assessed/performed             Past Medical History:  Diagnosis Date   Anxiety    Depression    HTN (hypertension)    resolved with weight loss s/p gastric bypass   Hypothyroidism    Since at least 2007   Osteoporosis    RLS (restless legs syndrome)    Sleep apnea    Stroke [facility])    2013/February 2017    Past Surgical History:  Procedure Laterality Date   BREAST BIOPSY Right    benign   CATARACT EXTRACTION  2018   CHOLECYSTECTOMY     COLONOSCOPY  2004   internal hemorrhoids, left-sided diverticulae, splenic flexure polyp (10mm), path unavailable at this time   COLONOSCOPY  04/2011   RMR: Internal and external hemorrhoids, rectal tubular adenoma removed, left-sided diverticulosis   COLONOSCOPY N/A 06/01/2016   Procedure: COLONOSCOPY;  Surgeon: Daneil Dolin, MD;  Location: [facility];  Service: Endoscopy;  Laterality: N/A;  0830   ESOPHAGOGASTRODUODENOSCOPY  2004   small hiatal hernia   GASTRIC ROUX-EN-Y  2006   REVERSE SHOULDER ARTHROPLASTY Left 09/29/2021   Procedure: LEFT  REVERSE SHOULDER ARTHROPLASTY;  Surgeon: Mordecai Rasmussen, MD;  Location: [facility] ORS;  Service: Orthopedics;  Laterality: Left;    There were no vitals filed for this visit.   Subjective Assessment - 11/01/21 1300     Subjective  S: I did those exercises yesterday.    Currently in Pain? Yes    Pain Score 2     Pain Location Shoulder    Pain Orientation Left    Pain Descriptors / Indicators Sore    Pain Type Acute pain    Pain Radiating Towards N/A    Pain Onset Yesterday    Pain Frequency Intermittent    Aggravating Factors  movement, sleeping wrong    Pain Relieving Factors pain medication    Effect of Pain on Daily Activities mod effect on ADLs    Multiple Pain Sites No                [facility] OT Assessment - 11/01/21 1259       Assessment   Medical Diagnosis s/p left reverse TSA      Precautions   Precautions Shoulder    Type of Shoulder Precautions See protocol.  OT Treatments/Exercises (OP) - 11/01/21 1306       Exercises   Exercises Shoulder      Shoulder Exercises: Supine   Protraction PROM;5 reps;AAROM;10 reps    Horizontal ABduction PROM;5 reps;AAROM;10 reps    External Rotation PROM;5 reps;AAROM;10 reps    Internal Rotation PROM;5 reps;AAROM;10 reps    Flexion PROM;5 reps;AAROM;10 reps    ABduction PROM;AAROM;5 reps      Shoulder Exercises: Pulleys   Flexion 1 minute    ABduction 1 minute      Shoulder Exercises: ROM/Strengthening   Other ROM/Strengthening Exercises PVC Pipe slide, 10X flexion                      OT Short Term Goals - 10/07/21 1017       OT SHORT TERM GOAL #1   Title Pt will be provided with and educated on HEP to improve LUE mobility required for use during ADLs.    Time 4    Period Weeks    Status On-going    Target Date 11/05/21      OT SHORT TERM GOAL #2   Title Pt will increase LUE P/ROM to improve ability to donn and doff shirts independently.    Time 4    Period Weeks     Status On-going      OT SHORT TERM GOAL #3   Title Pt will increase LUE strength to 3/5 or greater to improve ability to reach items at waist to chest height during ADLs.    Time 4    Period Weeks    Status On-going               OT Long Term Goals - 10/07/21 1017       OT LONG TERM GOAL #1   Title Pt will decrease LUE pain to 3/10 or less to improve ability to sleep in the bed instead of the recliner.    Time 8    Period Weeks    Status On-going      OT LONG TERM GOAL #2   Title Pt will increase LUE A/ROM to [facility] to improve ability to perform reaching tasks overhead and behind back during ADLs.    Time 8    Period Weeks    Status On-going      OT LONG TERM GOAL #3   Title Pt will decrease LUE fascial restrictions to min amounts or less to improve mobility required for functional reaching tasks.    Time 8    Period Weeks    Status On-going      OT LONG TERM GOAL #4   Title Pt will increase LUE strength to 4+/5 or greater to improve ability to perform housekeeping tasks requiring lifting or pulling/pushing.    Time 8    Period Weeks    Status On-going      OT LONG TERM GOAL #5   Title Pt will achieve highest level of functioning using LUE as non-dominant during ADL and leisure tasks.    Time 8    Period Weeks    Status On-going                   Plan - 11/01/21 1338     Clinical Impression Statement A: Continued with passive stretching and AA/ROM in supine, no manual techniques completed this session as pt needed to leave early to get to another MD appt. Added pvc pipe slide and pulleys today,  also added AA/ROM abduction in supine. Verbal cuing for form and technique.    Body Structure / Function / Physical Skills ADL;Endurance;Muscle spasms;UE functional use;Fascial restriction;Pain;ROM;IADL;Strength    Plan P: Mini-reassessment & progress note, continue with AA/ROM working to improve available ROM    OT Home Exercise Plan eval: table slides, elbow  flexion/extension, forearm supination/pronation 12/1: AA/ROM supine.    Consulted and Agree with Plan of Care Patient             Patient will benefit from skilled therapeutic intervention in order to improve the following deficits and impairments:   Body Structure / Function / Physical Skills: ADL, Endurance, Muscle spasms, UE functional use, Fascial restriction, Pain, ROM, IADL, Strength       Visit Diagnosis: Acute pain of left shoulder  Stiffness of left shoulder, not elsewhere classified  Other symptoms and signs involving the musculoskeletal system    Problem List Patient Active Problem List   Diagnosis Date Noted   Glenohumeral arthritis, left 09/29/2021   Intolerance of continuous positive airway pressure (CPAP) ventilation 02/17/2021   Status post bariatric surgery 02/17/2021   Periodic limb movement disorder (PLMD) 02/17/2021   OSA (obstructive sleep apnea) 02/17/2021   RLS (restless legs syndrome) 01/22/2018   History of colonic polyps    Constipation 05/11/2016   Gait difficulty 01/04/2016   Aphasia 01/04/2016   Acute CVA (cerebrovascular accident) ([place]) 01/04/2016   CVA (cerebral infarction) 01/04/2016   Ataxia    OSA on CPAP 12/02/2013   CTS (carpal tunnel syndrome) 03/18/2013   Wrist fracture 02/04/2013   Distal radius fracture 12/26/2012   Hyperlipidemia 06/30/2012   Elevated blood pressure reading without diagnosis of hypertension 06/29/2012   Stroke ([place]) 06/28/2012   Hypothyroidism 06/28/2012   Anxiety 06/28/2012   Personal history of colonic polyps 04/26/2011   Family history of colonic polyps 04/26/2011   ARTHRITIS, LEFT KNEE 06/06/2010   DERANGEMENT OF POSTERIOR HORN OF MEDIAL MENISCUS 06/06/2010   ANEMIA, B12 DEFICIENCY 02/07/2010   TRIGGER FINGER, THUMB 02/07/2010   SHOULDER PAIN 10/14/2008   BURSITIS, SHOULDER 10/14/2008   TRIGGER FINGER 06/15/2008    Guadelupe Sabin, OTR/L  [id number] 11/01/2021, 1:40 PM  [place], [place], [address] Phone: [phone]   Fax:  [phone]  Name: KIMMIE BERGGREN MRN: [id number] Date of Birth: [birth year]

## 2021-11-02 LAB — THYROID PANEL WITH TSH
Free Thyroxine Index: 2.4 (ref 1.2–4.9)
T3 Uptake Ratio: 27 % (ref 24–39)
T4, Total: 8.9 ug/dL (ref 4.5–12.0)
TSH: 1.77 u[IU]/mL (ref 0.450–4.500)

## 2021-11-02 LAB — COMPREHENSIVE METABOLIC PANEL
ALT: 15 IU/L (ref 0–32)
AST: 19 IU/L (ref 0–40)
Albumin/Globulin Ratio: 1.5 (ref 1.2–2.2)
Albumin: 3.8 g/dL (ref 3.7–4.7)
Alkaline Phosphatase: 262 IU/L — ABNORMAL HIGH (ref 44–121)
BUN/Creatinine Ratio: 15 (ref 12–28)
BUN: 16 mg/dL (ref 8–27)
Bilirubin Total: 0.2 mg/dL (ref 0.0–1.2)
CO2: 22 mmol/L (ref 20–29)
Calcium: 9.4 mg/dL (ref 8.7–10.3)
Chloride: 105 mmol/L (ref 96–106)
Creatinine, Ser: 1.04 mg/dL — ABNORMAL HIGH (ref 0.57–1.00)
Globulin, Total: 2.6 g/dL (ref 1.5–4.5)
Glucose: 81 mg/dL (ref 70–99)
Potassium: 4.5 mmol/L (ref 3.5–5.2)
Sodium: 142 mmol/L (ref 134–144)
Total Protein: 6.4 g/dL (ref 6.0–8.5)
eGFR: 57 mL/min/{1.73_m2} — ABNORMAL LOW (ref >59)

## 2021-11-02 NOTE — Progress Notes (Signed)
CM sent to [facility]

## 2021-11-03 ENCOUNTER — Encounter (HOSPITAL_COMMUNITY): Payer: Self-pay | Admitting: Occupational Therapy

## 2021-11-03 ENCOUNTER — Telehealth: Payer: Self-pay | Admitting: *Deleted

## 2021-11-03 ENCOUNTER — Ambulatory Visit (HOSPITAL_COMMUNITY): Payer: Medicare Other | Admitting: Occupational Therapy

## 2021-11-03 ENCOUNTER — Other Ambulatory Visit: Payer: Self-pay

## 2021-11-03 DIAGNOSIS — R29898 Other symptoms and signs involving the musculoskeletal system: Secondary | ICD-10-CM | POA: Diagnosis not present

## 2021-11-03 DIAGNOSIS — M25612 Stiffness of left shoulder, not elsewhere classified: Secondary | ICD-10-CM

## 2021-11-03 DIAGNOSIS — M25512 Pain in left shoulder: Secondary | ICD-10-CM

## 2021-11-03 NOTE — Progress Notes (Signed)
Less elevated creatinine since last documented blood draw.  Patient needs to speak to PCP about bone density testing ,her alk.phos is high and can correlate with demineralization of the bone.

## 2021-11-03 NOTE — Telephone Encounter (Signed)
-----  Message from Larey Seat, MD sent at 11/03/2021 12:37 PM EST ----- Less elevated creatinine since last documented blood draw.  Patient needs to speak to PCP about bone density testing ,her alk.phos is high and can correlate with demineralization of the bone.

## 2021-11-03 NOTE — Therapy (Signed)
[place], [place], [address] Phone: [phone]   Fax:  [phone]  Occupational Therapy Treatment Mini-reassessment   Patient Details  Name: Virginia [name] MRN: [id number] Date of Birth: 1945/12/01 Referring Provider (OT): Dr. Larena Glassman   Progress Note Reporting Period 10/06/21 to 11/03/2021  See note below for Objective Data and Assessment of Progress/Goals.      Encounter Date: 11/03/2021   OT End of Session - 11/03/21 1429     Visit Number 9    Number of Visits 16    Date for OT Re-Evaluation 12/05/21    Authorization Type 1) Medicare A & B 2) BCBS    Authorization Time Period BCBS visit limit 41    Authorization - Visit Number 9    Authorization - Number of Visits 75    Progress Note Due on Visit 53    OT Start Time 1344    OT Stop Time 1426    OT Time Calculation (min) 42 min    Activity Tolerance Patient tolerated treatment well    Behavior During Therapy WFL for tasks assessed/performed             Past Medical History:  Diagnosis Date   Anxiety    Depression    HTN (hypertension)    resolved with weight loss s/p gastric bypass   Hypothyroidism    Since at least 2007   Osteoporosis    RLS (restless legs syndrome)    Sleep apnea    Stroke [facility])    2013/February 2017    Past Surgical History:  Procedure Laterality Date   BREAST BIOPSY Right    benign   CATARACT EXTRACTION  2018   CHOLECYSTECTOMY     COLONOSCOPY  2004   internal hemorrhoids, left-sided diverticulae, splenic flexure polyp (55mm), path unavailable at this time   COLONOSCOPY  04/2011   RMR: Internal and external hemorrhoids, rectal tubular adenoma removed, left-sided diverticulosis   COLONOSCOPY N/A 06/01/2016   Procedure: COLONOSCOPY;  Surgeon: Daneil Dolin, MD;  Location: [facility];  Service: Endoscopy;  Laterality: N/A;  0830   ESOPHAGOGASTRODUODENOSCOPY  2004   small hiatal hernia   GASTRIC  ROUX-EN-Y  2006   REVERSE SHOULDER ARTHROPLASTY Left 09/29/2021   Procedure: LEFT REVERSE SHOULDER ARTHROPLASTY;  Surgeon: Mordecai Rasmussen, MD;  Location: [facility];  Service: Orthopedics;  Laterality: Left;    There were no vitals filed for this visit.   Subjective Assessment - 11/03/21 1341     Subjective  S: It's sore across the top.    Currently in Pain? Yes    Pain Score 2     Pain Location Shoulder    Pain Orientation Left    Pain Descriptors / Indicators Sore    Pain Type Acute pain    Pain Radiating Towards N/A    Pain Onset Yesterday    Pain Frequency Intermittent    Aggravating Factors  movement, er    Pain Relieving Factors pain medication    Effect of Pain on Daily Activities mod effect on ADLs    Multiple Pain Sites No                [facility] OT Assessment - 11/03/21 1340       Assessment   Medical Diagnosis s/p left reverse TSA      Precautions   Precautions Shoulder    Type of Shoulder Precautions See protocol.  Observation/Other Assessments   Focus on Therapeutic Outcomes (FOTO)  45/100   4/100 previous     Palpation   Palpation comment Mod fascial restrictions along left upper arm, trapezius, and scapular regions      AROM   Overall AROM Comments Assessed seated, er/IR adducted    AROM Assessment Site Shoulder    Right/Left Shoulder Left    Left Shoulder Flexion 90 Degrees   not previously assessed   Left Shoulder ABduction 81 Degrees   not previously assessed   Left Shoulder Internal Rotation 90 Degrees   not previously assessed   Left Shoulder External Rotation 28 Degrees   not previously assessed     PROM   Overall PROM Comments Assessed supine, er/IR adducted    PROM Assessment Site Shoulder    Right/Left Shoulder Left    Left Shoulder Flexion 126 Degrees   79 previous   Left Shoulder ABduction 140 Degrees   70 previous   Left Shoulder Internal Rotation 90 Degrees   same as previous   Left Shoulder External Rotation 30 Degrees   0  previous     Strength   Overall Strength Comments Assessed seated, er/IR adducted. Assessed via observation, no formal MMT testing    Strength Assessment Site Shoulder    Right/Left Shoulder Left    Left Shoulder Flexion 3-/5   not previously assessed   Left Shoulder ABduction 3-/5   not previously assessed   Left Shoulder Internal Rotation 3/5   not previously assessed   Left Shoulder External Rotation 3-/5   not previously assessed                     OT Treatments/Exercises (OP) - 11/03/21 1347       Exercises   Exercises Shoulder      Shoulder Exercises: Supine   Protraction PROM;5 reps;AAROM;10 reps    Horizontal ABduction PROM;5 reps;AAROM;10 reps    External Rotation PROM;5 reps;AAROM;10 reps    Internal Rotation PROM;5 reps;AAROM;10 reps    Flexion PROM;5 reps;AAROM;10 reps    ABduction PROM;AAROM;5 reps      Shoulder Exercises: Seated   Protraction AAROM;10 reps    External Rotation AAROM;10 reps    Internal Rotation AAROM;10 reps    Flexion AAROM;10 reps    Abduction AAROM;10 reps      Shoulder Exercises: Pulleys   Flexion 1 minute    ABduction 1 minute      Shoulder Exercises: ROM/Strengthening   Other ROM/Strengthening Exercises PVC Pipe slide, 10X flexion      Manual Therapy   Manual Therapy Myofascial release    Manual therapy comments Manual therapy completed prior to exercises.    Myofascial Release Myofascial release and manual stretching completed to left upper arm, upper trapezius, and scapularis region to decrease fascial restrictions and increase joint mobility in a pain free zone.                      OT Short Term Goals - 10/07/21 1017       OT SHORT TERM GOAL #1   Title Pt will be provided with and educated on HEP to improve LUE mobility required for use during ADLs.    Time 4    Period Weeks    Status On-going    Target Date 11/05/21      OT SHORT TERM GOAL #2   Title Pt will increase LUE P/ROM to improve  ability to donn and doff  shirts independently.    Time 4    Period Weeks    Status On-going      OT SHORT TERM GOAL #3   Title Pt will increase LUE strength to 3/5 or greater to improve ability to reach items at waist to chest height during ADLs.    Time 4    Period Weeks    Status On-going               OT Long Term Goals - 10/07/21 1017       OT LONG TERM GOAL #1   Title Pt will decrease LUE pain to 3/10 or less to improve ability to sleep in the bed instead of the recliner.    Time 8    Period Weeks    Status On-going      OT LONG TERM GOAL #2   Title Pt will increase LUE A/ROM to [facility] to improve ability to perform reaching tasks overhead and behind back during ADLs.    Time 8    Period Weeks    Status On-going      OT LONG TERM GOAL #3   Title Pt will decrease LUE fascial restrictions to min amounts or less to improve mobility required for functional reaching tasks.    Time 8    Period Weeks    Status On-going      OT LONG TERM GOAL #4   Title Pt will increase LUE strength to 4+/5 or greater to improve ability to perform housekeeping tasks requiring lifting or pulling/pushing.    Time 8    Period Weeks    Status On-going      OT LONG TERM GOAL #5   Title Pt will achieve highest level of functioning using LUE as non-dominant during ADL and leisure tasks.    Time 8    Period Weeks    Status On-going                   Plan - 11/03/21 1405     Clinical Impression Statement A: Mini-reassessment completed this session, pt reports soreness across the top of her shoulder today. Pt demonstrates improved ROM and strength of LUE, as well as improved functional use during ADLs. Continued with myofascial release to address fascial restrictions, passive stretching, and AA/ROM exercises. Progressed to AA/ROM in sitting, pt achieving ROM WFL during tasks with exception of er which continues to be pain limited. Verbal cuing for form and technique.    Body Structure  / Function / Physical Skills ADL;Endurance;Muscle spasms;UE functional use;Fascial restriction;Pain;ROM;IADL;Strength    Plan P: Continue with protocol, add wall wash    OT Home Exercise Plan eval: table slides, elbow flexion/extension, forearm supination/pronation 12/1: AA/ROM supine.    Consulted and Agree with Plan of Care Patient             Patient will benefit from skilled therapeutic intervention in order to improve the following deficits and impairments:   Body Structure / Function / Physical Skills: ADL, Endurance, Muscle spasms, UE functional use, Fascial restriction, Pain, ROM, IADL, Strength       Visit Diagnosis: Acute pain of left shoulder  Stiffness of left shoulder, not elsewhere classified  Other symptoms and signs involving the musculoskeletal system    Problem List Patient Active Problem List   Diagnosis Date Noted   Glenohumeral arthritis, left 09/29/2021   Intolerance of continuous positive airway pressure (CPAP) ventilation 02/17/2021   Status post bariatric surgery 02/17/2021  Periodic limb movement disorder (PLMD) 02/17/2021   OSA (obstructive sleep apnea) 02/17/2021   RLS (restless legs syndrome) 01/22/2018   History of colonic polyps    Constipation 05/11/2016   Gait difficulty 01/04/2016   Aphasia 01/04/2016   Acute CVA (cerebrovascular accident) ([place]) 01/04/2016   CVA (cerebral infarction) 01/04/2016   Ataxia    OSA on CPAP 12/02/2013   CTS (carpal tunnel syndrome) 03/18/2013   Wrist fracture 02/04/2013   Distal radius fracture 12/26/2012   Hyperlipidemia 06/30/2012   Elevated blood pressure reading without diagnosis of hypertension 06/29/2012   Stroke ([place]) 06/28/2012   Hypothyroidism 06/28/2012   Anxiety 06/28/2012   Personal history of colonic polyps 04/26/2011   Family history of colonic polyps 04/26/2011   ARTHRITIS, LEFT KNEE 06/06/2010   DERANGEMENT OF POSTERIOR HORN OF MEDIAL MENISCUS 06/06/2010   ANEMIA, B12 DEFICIENCY  02/07/2010   TRIGGER FINGER, THUMB 02/07/2010   SHOULDER PAIN 10/14/2008   BURSITIS, SHOULDER 10/14/2008   TRIGGER FINGER 06/15/2008    Guadelupe Sabin, OTR/L  [phone] 11/03/2021, 2:30 PM  [place] [address] [place], [place], [address] Phone: [phone]   Fax:  [phone]  Name: EUSEBIA GRULKE MRN: [id number] Date of Birth: [birth year]

## 2021-11-03 NOTE — Telephone Encounter (Signed)
Called and spoke w/ pt. Relayed results per Dr. Edwena Felty note. Pt verbalized understanding. She will reach out to PCP about getting repeat bone density test. She did have one years ago. I forwarded results via epic to PCP.

## 2021-11-08 ENCOUNTER — Ambulatory Visit (HOSPITAL_COMMUNITY): Payer: Medicare Other | Admitting: Occupational Therapy

## 2021-11-08 ENCOUNTER — Encounter (HOSPITAL_COMMUNITY): Payer: Self-pay | Admitting: Occupational Therapy

## 2021-11-08 ENCOUNTER — Other Ambulatory Visit: Payer: Self-pay

## 2021-11-08 DIAGNOSIS — M25612 Stiffness of left shoulder, not elsewhere classified: Secondary | ICD-10-CM

## 2021-11-08 DIAGNOSIS — R29898 Other symptoms and signs involving the musculoskeletal system: Secondary | ICD-10-CM

## 2021-11-08 DIAGNOSIS — M25512 Pain in left shoulder: Secondary | ICD-10-CM | POA: Diagnosis not present

## 2021-11-08 NOTE — Therapy (Signed)
[place], [place], [address] Phone: [phone]   Fax:  [phone]  Occupational Therapy Treatment  Patient Details  Name: Virginia [name] MRN: [id number] Date of Birth: 01-27-1946 Referring Provider (OT): Dr. Larena Glassman   Encounter Date: 11/08/2021   OT End of Session - 11/08/21 1434     Visit Number 10    Number of Visits 16    Date for OT Re-Evaluation 12/05/21    Authorization Type 1) Medicare A & B 2) BCBS    Authorization Time Period BCBS visit limit 55    Authorization - Visit Number 10    Authorization - Number of Visits 75    Progress Note Due on Visit 65    OT Start Time 1348    OT Stop Time 1428    OT Time Calculation (min) 40 min    Activity Tolerance Patient tolerated treatment well    Behavior During Therapy [facility] for tasks assessed/performed             Past Medical History:  Diagnosis Date   Anxiety    Depression    HTN (hypertension)    resolved with weight loss s/p gastric bypass   Hypothyroidism    Since at least 2007   Osteoporosis    RLS (restless legs syndrome)    Sleep apnea    Stroke [facility])    2013/February 2017    Past Surgical History:  Procedure Laterality Date   BREAST BIOPSY Right    benign   CATARACT EXTRACTION  2018   CHOLECYSTECTOMY     COLONOSCOPY  2004   internal hemorrhoids, left-sided diverticulae, splenic flexure polyp (65mm), path unavailable at this time   COLONOSCOPY  04/2011   RMR: Internal and external hemorrhoids, rectal tubular adenoma removed, left-sided diverticulosis   COLONOSCOPY N/A 06/01/2016   Procedure: COLONOSCOPY;  Surgeon: Daneil Dolin, MD;  Location: [facility];  Service: Endoscopy;  Laterality: N/A;  0830   ESOPHAGOGASTRODUODENOSCOPY  2004   small hiatal hernia   GASTRIC ROUX-EN-Y  2006   REVERSE SHOULDER ARTHROPLASTY Left 09/29/2021   Procedure: LEFT REVERSE SHOULDER ARTHROPLASTY;  Surgeon: Mordecai Rasmussen, MD;  Location: [facility];  Service: Orthopedics;  Laterality: Left;    There were no vitals filed for this visit.   Subjective Assessment - 11/08/21 1346     Subjective  S: I'm feeling it at that same spot in front of my shoulder.    Currently in Pain? Yes    Pain Score 2     Pain Location Shoulder    Pain Orientation Left    Pain Descriptors / Indicators Aching;Sore    Pain Type Acute pain    Pain Radiating Towards N/A    Pain Onset Yesterday    Pain Frequency Intermittent    Aggravating Factors  movement, er    Pain Relieving Factors pain medication    Effect of Pain on Daily Activities mod effect on ADLs    Multiple Pain Sites No                [facility] OT Assessment - 11/08/21 1345       Assessment   Medical Diagnosis s/p left reverse TSA      Precautions   Precautions Shoulder    Type of Shoulder Precautions See protocol.                      OT Treatments/Exercises (  OP) - 11/08/21 1351       Exercises   Exercises Shoulder      Shoulder Exercises: Supine   Protraction PROM;5 reps;AAROM;12 reps    Horizontal ABduction PROM;5 reps;AAROM;12 reps    External Rotation PROM;5 reps;AAROM;12 reps    Internal Rotation PROM;5 reps;AAROM;12 reps    Flexion PROM;5 reps;AAROM;12 reps    ABduction PROM;5 reps;AAROM;10 reps      Shoulder Exercises: Seated   Protraction AAROM;10 reps    Horizontal ABduction AAROM;10 reps    External Rotation AAROM;10 reps    Internal Rotation AAROM;10 reps    Flexion AAROM;10 reps    Abduction AAROM;10 reps      Shoulder Exercises: Pulleys   Flexion 1 minute    ABduction 1 minute      Shoulder Exercises: ROM/Strengthening   Wall Wash 1'    Other ROM/Strengthening Exercises PVC Pipe slide, 10X flexion      Manual Therapy   Manual Therapy Myofascial release    Manual therapy comments Manual therapy completed prior to exercises.    Myofascial Release Myofascial release and manual stretching completed to left upper arm, upper trapezius,  and scapularis region to decrease fascial restrictions and increase joint mobility in a pain free zone.                      OT Short Term Goals - 10/07/21 1017       OT SHORT TERM GOAL #1   Title Pt will be provided with and educated on HEP to improve LUE mobility required for use during ADLs.    Time 4    Period Weeks    Status On-going    Target Date 11/05/21      OT SHORT TERM GOAL #2   Title Pt will increase LUE P/ROM to improve ability to donn and doff shirts independently.    Time 4    Period Weeks    Status On-going      OT SHORT TERM GOAL #3   Title Pt will increase LUE strength to 3/5 or greater to improve ability to reach items at waist to chest height during ADLs.    Time 4    Period Weeks    Status On-going               OT Long Term Goals - 10/07/21 1017       OT LONG TERM GOAL #1   Title Pt will decrease LUE pain to 3/10 or less to improve ability to sleep in the bed instead of the recliner.    Time 8    Period Weeks    Status On-going      OT LONG TERM GOAL #2   Title Pt will increase LUE A/ROM to [facility] to improve ability to perform reaching tasks overhead and behind back during ADLs.    Time 8    Period Weeks    Status On-going      OT LONG TERM GOAL #3   Title Pt will decrease LUE fascial restrictions to min amounts or less to improve mobility required for functional reaching tasks.    Time 8    Period Weeks    Status On-going      OT LONG TERM GOAL #4   Title Pt will increase LUE strength to 4+/5 or greater to improve ability to perform housekeeping tasks requiring lifting or pulling/pushing.    Time 8    Period Weeks  Status On-going      OT LONG TERM GOAL #5   Title Pt will achieve highest level of functioning using LUE as non-dominant during ADL and leisure tasks.    Time 8    Period Weeks    Status On-going                   Plan - 11/08/21 1414     Clinical Impression Statement A: Pt continues to report  soreness at anterior shoulder an AC joint region. Continued with myofascial release and passive stretching, pt reports catching pain at anterior shoulder that improves with joint distraction during stretching. Continued with AA/ROM in supine and sitting. Continued with joint distraction during AA/ROM with improvement in catching. Added horizontal abduction in sitting today, also added wall wash. Verbal cuing for form and technique.    Body Structure / Function / Physical Skills ADL;Endurance;Muscle spasms;UE functional use;Fascial restriction;Pain;ROM;IADL;Strength    Plan P: Continue with protocol, add proximal shoulder strengthening in supine    OT Home Exercise Plan eval: table slides, elbow flexion/extension, forearm supination/pronation 12/1: AA/ROM supine.    Consulted and Agree with Plan of Care Patient             Patient will benefit from skilled therapeutic intervention in order to improve the following deficits and impairments:   Body Structure / Function / Physical Skills: ADL, Endurance, Muscle spasms, UE functional use, Fascial restriction, Pain, ROM, IADL, Strength       Visit Diagnosis: Acute pain of left shoulder  Stiffness of left shoulder, not elsewhere classified  Other symptoms and signs involving the musculoskeletal system    Problem List Patient Active Problem List   Diagnosis Date Noted   Glenohumeral arthritis, left 09/29/2021   Intolerance of continuous positive airway pressure (CPAP) ventilation 02/17/2021   Status post bariatric surgery 02/17/2021   Periodic limb movement disorder (PLMD) 02/17/2021   OSA (obstructive sleep apnea) 02/17/2021   RLS (restless legs syndrome) 01/22/2018   History of colonic polyps    Constipation 05/11/2016   Gait difficulty 01/04/2016   Aphasia 01/04/2016   Acute CVA (cerebrovascular accident) ([place]) 01/04/2016   CVA (cerebral infarction) 01/04/2016   Ataxia    OSA on CPAP 12/02/2013   CTS (carpal tunnel syndrome)  03/18/2013   Wrist fracture 02/04/2013   Distal radius fracture 12/26/2012   Hyperlipidemia 06/30/2012   Elevated blood pressure reading without diagnosis of hypertension 06/29/2012   Stroke ([place]) 06/28/2012   Hypothyroidism 06/28/2012   Anxiety 06/28/2012   Personal history of colonic polyps 04/26/2011   Family history of colonic polyps 04/26/2011   ARTHRITIS, LEFT KNEE 06/06/2010   DERANGEMENT OF POSTERIOR HORN OF MEDIAL MENISCUS 06/06/2010   ANEMIA, B12 DEFICIENCY 02/07/2010   TRIGGER FINGER, THUMB 02/07/2010   SHOULDER PAIN 10/14/2008   BURSITIS, SHOULDER 10/14/2008   TRIGGER FINGER 06/15/2008   Guadelupe Sabin, OTR/L  [phone] 11/08/2021, 2:36 PM  [place], [place], [address] Phone: [phone]   Fax:  [phone]  Name: Virginia [name] MRN: [id number] Date of Birth: 10-04-1946

## 2021-11-10 ENCOUNTER — Encounter (HOSPITAL_COMMUNITY): Payer: Self-pay | Admitting: Occupational Therapy

## 2021-11-10 ENCOUNTER — Ambulatory Visit (HOSPITAL_COMMUNITY): Payer: Medicare Other | Admitting: Occupational Therapy

## 2021-11-10 ENCOUNTER — Other Ambulatory Visit: Payer: Self-pay

## 2021-11-10 DIAGNOSIS — M25512 Pain in left shoulder: Secondary | ICD-10-CM | POA: Diagnosis not present

## 2021-11-10 DIAGNOSIS — R29898 Other symptoms and signs involving the musculoskeletal system: Secondary | ICD-10-CM

## 2021-11-10 DIAGNOSIS — M25612 Stiffness of left shoulder, not elsewhere classified: Secondary | ICD-10-CM

## 2021-11-10 NOTE — Therapy (Signed)
[place], [place], [address] Phone: [phone]   Fax:  [phone]  Occupational Therapy Treatment  Patient Details  Name: Virginia [name] MRN: [id number] Date of Birth: Aug 18, 1946 Referring Provider (OT): Dr. Larena Glassman  Measurements from [place] on 11/03/21:    AROM    Overall AROM Comments Assessed seated, er/IR adducted     AROM Assessment Site Shoulder     Right/Left Shoulder Left     Left Shoulder Flexion 90 Degrees   not previously assessed    Left Shoulder ABduction 81 Degrees   not previously assessed    Left Shoulder Internal Rotation 90 Degrees   not previously assessed    Left Shoulder External Rotation 28 Degrees   not previously assessed         PROM    Overall PROM Comments Assessed supine, er/IR adducted     PROM Assessment Site Shoulder     Right/Left Shoulder Left     Left Shoulder Flexion 126 Degrees   79 previous    Left Shoulder ABduction 140 Degrees   70 previous    Left Shoulder Internal Rotation 90 Degrees   same as previous    Left Shoulder External Rotation 30 Degrees   0 previous         Strength    Overall Strength Comments Assessed seated, er/IR adducted. Assessed via observation, no formal MMT testing     Strength Assessment Site Shoulder     Right/Left Shoulder Left     Left Shoulder Flexion 3-/5   not previously assessed    Left Shoulder ABduction 3-/5   not previously assessed    Left Shoulder Internal Rotation 3/5   not previously assessed    Left Shoulder External Rotation 3-/5   not previously assessed     Encounter Date: 11/10/2021   OT End of Session - 11/10/21 1429     Visit Number 11    Number of Visits 16    Date for OT Re-Evaluation 12/05/21    Authorization Type 1) Medicare A & B 2) BCBS    Authorization Time Period BCBS visit limit 1    Authorization - Visit Number 11    Authorization - Number of Visits 75    Progress Note Due on Visit 6     OT Start Time 1343    OT Stop Time 1427    OT Time Calculation (min) 44 min    Activity Tolerance Patient tolerated treatment well    Behavior During Therapy WFL for tasks assessed/performed             Past Medical History:  Diagnosis Date   Anxiety    Depression    HTN (hypertension)    resolved with weight loss s/p gastric bypass   Hypothyroidism    Since at least 2007   Osteoporosis    RLS (restless legs syndrome)    Sleep apnea    Stroke [facility])    2013/February 2017    Past Surgical History:  Procedure Laterality Date   BREAST BIOPSY Right    benign   CATARACT EXTRACTION  2018   CHOLECYSTECTOMY     COLONOSCOPY  2004   internal hemorrhoids, left-sided diverticulae, splenic flexure polyp (81mm), path unavailable at this time   COLONOSCOPY  04/2011   RMR: Internal and external hemorrhoids, rectal tubular adenoma removed, left-sided diverticulosis   COLONOSCOPY N/A 06/01/2016   Procedure: COLONOSCOPY;  Surgeon: Daneil Dolin, MD;  Location: [facility];  Service: Endoscopy;  Laterality: N/A;  0830   ESOPHAGOGASTRODUODENOSCOPY  2004   small hiatal hernia   GASTRIC ROUX-EN-Y  2006   REVERSE SHOULDER ARTHROPLASTY Left 09/29/2021   Procedure: LEFT REVERSE SHOULDER ARTHROPLASTY;  Surgeon: Mordecai Rasmussen, MD;  Location: [facility];  Service: Orthopedics;  Laterality: Left;    There were no vitals filed for this visit.   Subjective Assessment - 11/10/21 1343     Subjective  S: I had to take a real pain pill yesterday.    Currently in Pain? Yes    Pain Score 2     Pain Location Shoulder    Pain Orientation Left    Pain Descriptors / Indicators Aching;Sore    Pain Type Acute pain    Pain Radiating Towards N/A    Pain Onset Yesterday    Pain Frequency Intermittent    Aggravating Factors  movement, er    Pain Relieving Factors pain medication    Effect of Pain on Daily Activities mod effect on ADLs    Multiple Pain Sites No                [facility] OT Assessment  - 11/10/21 1342       Assessment   Medical Diagnosis s/p left reverse TSA      Precautions   Precautions Shoulder    Type of Shoulder Precautions See protocol.                      OT Treatments/Exercises (OP) - 11/10/21 1346       Exercises   Exercises Shoulder      Shoulder Exercises: Supine   Protraction PROM;5 reps;AAROM;12 reps    Horizontal ABduction PROM;5 reps;AAROM;12 reps    External Rotation PROM;5 reps;AAROM;12 reps    Internal Rotation PROM;5 reps;AAROM;12 reps    Flexion PROM;5 reps;AAROM;12 reps    ABduction PROM;5 reps;AAROM;10 reps      Shoulder Exercises: Seated   Retraction AROM;10 reps    Row AROM;10 reps      Shoulder Exercises: ROM/Strengthening   Anterior Glide 3x10" holds    Proximal Shoulder Strengthening, Supine 10X each, no rest breaks    Other ROM/Strengthening Exercises PVC Pipe slide, 10X flexion      Manual Therapy   Manual Therapy Myofascial release    Manual therapy comments Manual therapy completed prior to exercises.    Myofascial Release Myofascial release and manual stretching completed to left upper arm, upper trapezius, and scapularis region to decrease fascial restrictions and increase joint mobility in a pain free zone.                      OT Short Term Goals - 10/07/21 1017       OT SHORT TERM GOAL #1   Title Pt will be provided with and educated on HEP to improve LUE mobility required for use during ADLs.    Time 4    Period Weeks    Status On-going    Target Date 11/05/21      OT SHORT TERM GOAL #2   Title Pt will increase LUE P/ROM to improve ability to donn and doff shirts independently.    Time 4    Period Weeks    Status On-going      OT SHORT TERM GOAL #3   Title Pt will increase LUE strength to 3/5 or greater to improve ability to reach items at  waist to chest height during ADLs.    Time 4    Period Weeks    Status On-going               OT Long Term Goals - 10/07/21 1017        OT LONG TERM GOAL #1   Title Pt will decrease LUE pain to 3/10 or less to improve ability to sleep in the bed instead of the recliner.    Time 8    Period Weeks    Status On-going      OT LONG TERM GOAL #2   Title Pt will increase LUE A/ROM to [facility] to improve ability to perform reaching tasks overhead and behind back during ADLs.    Time 8    Period Weeks    Status On-going      OT LONG TERM GOAL #3   Title Pt will decrease LUE fascial restrictions to min amounts or less to improve mobility required for functional reaching tasks.    Time 8    Period Weeks    Status On-going      OT LONG TERM GOAL #4   Title Pt will increase LUE strength to 4+/5 or greater to improve ability to perform housekeeping tasks requiring lifting or pulling/pushing.    Time 8    Period Weeks    Status On-going      OT LONG TERM GOAL #5   Title Pt will achieve highest level of functioning using LUE as non-dominant during ADL and leisure tasks.    Time 8    Period Weeks    Status On-going                   Plan - 11/10/21 1404     Clinical Impression Statement A: Pt reports she had to take a pain pill yesterday due to pain in the anterior shoulder immediately above incision area. Pt reporting it feels like the bones are touching each other as she moves her shoulder up into flexion at the 50% range. Continued with myofascial release to address fascial restrictions, passive stretching. Continued with joint distraction with improvement in ROM tolerance. Pt completing AA/ROM in supine, continues to have the most difficulty with flexion due to pain at 50% range. Pt completing scapular A/ROM without difficulty, added anterior glide with good results. Verbal cuing for form and technique.    Body Structure / Function / Physical Skills ADL;Endurance;Muscle spasms;UE functional use;Fascial restriction;Pain;ROM;IADL;Strength    Plan P: Follow up on MD appt, continue with AA/ROM and protocol    OT Home  Exercise Plan eval: table slides, elbow flexion/extension, forearm supination/pronation 12/1: AA/ROM supine.    Consulted and Agree with Plan of Care Patient             Patient will benefit from skilled therapeutic intervention in order to improve the following deficits and impairments:   Body Structure / Function / Physical Skills: ADL, Endurance, Muscle spasms, UE functional use, Fascial restriction, Pain, ROM, IADL, Strength       Visit Diagnosis: Acute pain of left shoulder  Stiffness of left shoulder, not elsewhere classified  Other symptoms and signs involving the musculoskeletal system    Problem List Patient Active Problem List   Diagnosis Date Noted   Glenohumeral arthritis, left 09/29/2021   Intolerance of continuous positive airway pressure (CPAP) ventilation 02/17/2021   Status post bariatric surgery 02/17/2021   Periodic limb movement disorder (PLMD) 02/17/2021   OSA (obstructive sleep  apnea) 02/17/2021   RLS (restless legs syndrome) 01/22/2018   History of colonic polyps    Constipation 05/11/2016   Gait difficulty 01/04/2016   Aphasia 01/04/2016   Acute CVA (cerebrovascular accident) ([place]) 01/04/2016   CVA (cerebral infarction) 01/04/2016   Ataxia    OSA on CPAP 12/02/2013   CTS (carpal tunnel syndrome) 03/18/2013   Wrist fracture 02/04/2013   Distal radius fracture 12/26/2012   Hyperlipidemia 06/30/2012   Elevated blood pressure reading without diagnosis of hypertension 06/29/2012   Stroke ([place]) 06/28/2012   Hypothyroidism 06/28/2012   Anxiety 06/28/2012   Personal history of colonic polyps 04/26/2011   Family history of colonic polyps 04/26/2011   ARTHRITIS, LEFT KNEE 06/06/2010   DERANGEMENT OF POSTERIOR HORN OF MEDIAL MENISCUS 06/06/2010   ANEMIA, B12 DEFICIENCY 02/07/2010   TRIGGER FINGER, THUMB 02/07/2010   SHOULDER PAIN 10/14/2008   BURSITIS, SHOULDER 10/14/2008   TRIGGER FINGER 06/15/2008    Guadelupe Sabin, OTR/L   [phone] 11/10/2021, 2:31 PM  [place] [address] [place], [place], [address] Phone: [phone]   Fax:  [phone]  Name: MIGUELINA FORE MRN: [id number] Date of Birth: December 21, 1945

## 2021-11-14 NOTE — Progress Notes (Signed)
AHI is 7.3/h - a little higher than desired.

## 2021-11-15 ENCOUNTER — Ambulatory Visit (HOSPITAL_COMMUNITY): Payer: Medicare Other | Admitting: Occupational Therapy

## 2021-11-15 ENCOUNTER — Ambulatory Visit (INDEPENDENT_AMBULATORY_CARE_PROVIDER_SITE_OTHER): Payer: Medicare Other | Admitting: Orthopedic Surgery

## 2021-11-15 ENCOUNTER — Ambulatory Visit: Payer: Medicare Other

## 2021-11-15 ENCOUNTER — Other Ambulatory Visit: Payer: Self-pay

## 2021-11-15 ENCOUNTER — Encounter (HOSPITAL_COMMUNITY): Payer: Self-pay | Admitting: Occupational Therapy

## 2021-11-15 DIAGNOSIS — M25512 Pain in left shoulder: Secondary | ICD-10-CM | POA: Diagnosis not present

## 2021-11-15 DIAGNOSIS — Z96612 Presence of left artificial shoulder joint: Secondary | ICD-10-CM

## 2021-11-15 DIAGNOSIS — M25612 Stiffness of left shoulder, not elsewhere classified: Secondary | ICD-10-CM

## 2021-11-15 DIAGNOSIS — R29898 Other symptoms and signs involving the musculoskeletal system: Secondary | ICD-10-CM | POA: Diagnosis not present

## 2021-11-15 NOTE — Therapy (Signed)
[place], [place], [address] Phone: [phone]   Fax:  [phone]  Occupational Therapy Treatment  Patient Details  Name: Virginia [name] MRN: [id number] Date of Birth: 07/11/1946 Referring Provider (OT): Dr. Larena Glassman   Encounter Date: 11/15/2021   OT End of Session - 11/15/21 1424     Visit Number 12    Number of Visits 16    Date for OT Re-Evaluation 12/05/21    Authorization Type 1) Medicare A & B 2) BCBS    Authorization Time Period BCBS visit limit 75    Authorization - Visit Number 12    Authorization - Number of Visits 75    Progress Note Due on Visit 19    OT Start Time 1346    OT Stop Time 1426    OT Time Calculation (min) 40 min    Activity Tolerance Patient tolerated treatment well    Behavior During Therapy [facility] for tasks assessed/performed             Past Medical History:  Diagnosis Date   Anxiety    Depression    HTN (hypertension)    resolved with weight loss s/p gastric bypass   Hypothyroidism    Since at least 2007   Osteoporosis    RLS (restless legs syndrome)    Sleep apnea    Stroke [facility])    2013/February 2017    Past Surgical History:  Procedure Laterality Date   BREAST BIOPSY Right    benign   CATARACT EXTRACTION  2018   CHOLECYSTECTOMY     COLONOSCOPY  2004   internal hemorrhoids, left-sided diverticulae, splenic flexure polyp (47mm), path unavailable at this time   COLONOSCOPY  04/2011   RMR: Internal and external hemorrhoids, rectal tubular adenoma removed, left-sided diverticulosis   COLONOSCOPY N/A 06/01/2016   Procedure: COLONOSCOPY;  Surgeon: Daneil Dolin, MD;  Location: [facility];  Service: Endoscopy;  Laterality: N/A;  0830   ESOPHAGOGASTRODUODENOSCOPY  2004   small hiatal hernia   GASTRIC ROUX-EN-Y  2006   REVERSE SHOULDER ARTHROPLASTY Left 09/29/2021   Procedure: LEFT REVERSE SHOULDER ARTHROPLASTY;  Surgeon: Mordecai Rasmussen, MD;  Location: [facility]  ORS;  Service: Orthopedics;  Laterality: Left;    There were no vitals filed for this visit.   Subjective Assessment - 11/15/21 1347     Subjective  S: The doctor said my x-rays look good.    Currently in Pain? Yes    Pain Score 2     Pain Location Shoulder    Pain Orientation Left    Pain Descriptors / Indicators Aching;Sore    Pain Type Acute pain    Pain Radiating Towards N/A    Pain Onset In the past 7 days    Pain Frequency Intermittent    Aggravating Factors  movement, er    Pain Relieving Factors pain medication    Effect of Pain on Daily Activities mod effect on ADLs    Multiple Pain Sites No                [facility] OT Assessment - 11/15/21 1346       Assessment   Medical Diagnosis s/p left reverse TSA      Precautions   Precautions Shoulder    Type of Shoulder Precautions See protocol.                      OT Treatments/Exercises (OP) -  11/15/21 1410       Exercises   Exercises Shoulder      Shoulder Exercises: Supine   Protraction PROM;5 reps    Horizontal ABduction PROM;5 reps    External Rotation PROM;5 reps    Internal Rotation PROM;5 reps    Flexion PROM;5 reps    ABduction PROM;5 reps      Shoulder Exercises: Standing   Protraction AAROM;15 reps    Horizontal ABduction AAROM;15 reps    External Rotation AAROM;15 reps    Internal Rotation AAROM;15 reps    Flexion AAROM;15 reps    ABduction AAROM;15 reps    Extension Theraband;10 reps    Theraband Level (Shoulder Extension) Level 2 (Red)    Row Theraband;10 reps    Theraband Level (Shoulder Row) Level 2 (Red)    Retraction Theraband;10 reps    Theraband Level (Shoulder Retraction) Level 2 (Red)      Shoulder Exercises: ROM/Strengthening   UBE (Upper Arm Bike) Level 1 2' forward 2' reverse, pace: 6.0      Modalities   Modalities Ultrasound      Ultrasound   Ultrasound Location anterior shoulder near incision    Ultrasound Parameters 1.5 w/cm2    Ultrasound Goals  Pain;Other (Comment)   decrease adhesions     Manual Therapy   Manual Therapy Myofascial release    Manual therapy comments Manual therapy completed prior to exercises.    Myofascial Release Myofascial release and manual stretching completed to left upper arm, upper trapezius, and scapularis region to decrease fascial restrictions and increase joint mobility in a pain free zone.                      OT Short Term Goals - 10/07/21 1017       OT SHORT TERM GOAL #1   Title Pt will be provided with and educated on HEP to improve LUE mobility required for use during ADLs.    Time 4    Period Weeks    Status On-going    Target Date 11/05/21      OT SHORT TERM GOAL #2   Title Pt will increase LUE P/ROM to improve ability to donn and doff shirts independently.    Time 4    Period Weeks    Status On-going      OT SHORT TERM GOAL #3   Title Pt will increase LUE strength to 3/5 or greater to improve ability to reach items at waist to chest height during ADLs.    Time 4    Period Weeks    Status On-going               OT Long Term Goals - 10/07/21 1017       OT LONG TERM GOAL #1   Title Pt will decrease LUE pain to 3/10 or less to improve ability to sleep in the bed instead of the recliner.    Time 8    Period Weeks    Status On-going      OT LONG TERM GOAL #2   Title Pt will increase LUE A/ROM to [facility] to improve ability to perform reaching tasks overhead and behind back during ADLs.    Time 8    Period Weeks    Status On-going      OT LONG TERM GOAL #3   Title Pt will decrease LUE fascial restrictions to min amounts or less to improve mobility required for functional reaching tasks.  Time 8    Period Weeks    Status On-going      OT LONG TERM GOAL #4   Title Pt will increase LUE strength to 4+/5 or greater to improve ability to perform housekeeping tasks requiring lifting or pulling/pushing.    Time 8    Period Weeks    Status On-going      OT LONG  TERM GOAL #5   Title Pt will achieve highest level of functioning using LUE as non-dominant during ADL and leisure tasks.    Time 8    Period Weeks    Status On-going                   Plan - 11/15/21 1412     Clinical Impression Statement A: Pt reports MD is pleased with her progress, suspects scar tissue could be causing the catching pain she is feeling. Completed myofascial release to address fascial restrictions, followed by [place] today to address pain and scar tissue/adhesions. Pt with only one instance of catching/popping during P/ROM after [place], also note increased ROM with abduction. Pt completing AA/ROM in standing with full ROM for flexion. Added scapular theraband and UBE today. Verbal cuing for form and technique.    Body Structure / Function / Physical Skills ADL;Endurance;Muscle spasms;UE functional use;Fascial restriction;Pain;ROM;IADL;Strength    Plan P: Continue [place]. progress to A/ROM supine, continue with scapular theraband and proximal shoulder strengthening    OT Home Exercise Plan eval: table slides, elbow flexion/extension, forearm supination/pronation 12/1: AA/ROM supine.    Consulted and Agree with Plan of Care Patient             Patient will benefit from skilled therapeutic intervention in order to improve the following deficits and impairments:   Body Structure / Function / Physical Skills: ADL, Endurance, Muscle spasms, UE functional use, Fascial restriction, Pain, ROM, IADL, Strength       Visit Diagnosis: Acute pain of left shoulder  Stiffness of left shoulder, not elsewhere classified  Other symptoms and signs involving the musculoskeletal system    Problem List Patient Active Problem List   Diagnosis Date Noted   Glenohumeral arthritis, left 09/29/2021   Intolerance of continuous positive airway pressure (CPAP) ventilation 02/17/2021   Status post bariatric surgery 02/17/2021   Periodic limb movement disorder (PLMD) 02/17/2021   OSA  (obstructive sleep apnea) 02/17/2021   RLS (restless legs syndrome) 01/22/2018   History of colonic polyps    Constipation 05/11/2016   Gait difficulty 01/04/2016   Aphasia 01/04/2016   Acute CVA (cerebrovascular accident) ([place]) 01/04/2016   CVA (cerebral infarction) 01/04/2016   Ataxia    OSA on CPAP 12/02/2013   CTS (carpal tunnel syndrome) 03/18/2013   Wrist fracture 02/04/2013   Distal radius fracture 12/26/2012   Hyperlipidemia 06/30/2012   Elevated blood pressure reading without diagnosis of hypertension 06/29/2012   Stroke ([place]) 06/28/2012   Hypothyroidism 06/28/2012   Anxiety 06/28/2012   Personal history of colonic polyps 04/26/2011   Family history of colonic polyps 04/26/2011   ARTHRITIS, LEFT KNEE 06/06/2010   DERANGEMENT OF POSTERIOR HORN OF MEDIAL MENISCUS 06/06/2010   ANEMIA, B12 DEFICIENCY 02/07/2010   TRIGGER FINGER, THUMB 02/07/2010   SHOULDER PAIN 10/14/2008   BURSITIS, SHOULDER 10/14/2008   TRIGGER FINGER 06/15/2008   Guadelupe Sabin, OTR/L  [phone] 11/15/2021, 2:26 PM  [place], [place], [address] Phone: [phone]   Fax:  [phone]  Name: Virginia [name]  MRN: [id number] Date of Birth: Nov 02, 1946

## 2021-11-16 ENCOUNTER — Encounter: Payer: Self-pay | Admitting: Orthopedic Surgery

## 2021-11-16 NOTE — Progress Notes (Signed)
Orthopaedic Postop Note  Assessment: Virginia [name] is a 75 y.o. female s/p Left Reverse Shoulder Arthroplasty  DOS: 09/29/2021  Plan: Repeat radiographs are stable.  These were demonstrated to the patient in clinic.  All questions were answered.  Overall, I feel she is doing very well following surgery.  The popping sensation she has with greater than 140 degrees of forward elevation is nonspecific.  Her motion is outstanding, and I have urged her to be a little more cautious, especially with this popping sensation which causes her some discomfort.  Continue with physical therapy.  Gradual transition to strengthening of the left shoulder.  Follow-up in approximately 6 weeks.   Follow-up: Return if symptoms worsen or fail to improve.  XR at next visit: Left shoulder  Subjective:  Chief Complaint  Patient presents with   Routine Post Op    DOS 09/29/21 S/P LT RSA    History of Present Illness: Virginia [name] is a 75 y.o. female who presents following the above stated procedure.  Surgery was approximately 6 weeks ago.  She is progressing well with therapy.  More recently, she has experienced a popping sensation at the extremes of motion, which does cause her some discomfort.  No falls, slips or injuries otherwise.  She continues to work diligently with physical therapy.  No numbness or tingling.     Review of Systems: No fevers or chills No numbness or tingling No Chest Pain No shortness of breath    Objective: There were no vitals taken for this visit.  Physical Exam:  Alert and oriented.  No acute distress.  Surgical incision is healing well.  No surrounding erythema or drainage.  Sensation is intact in the axillary nerve distribution.  Passive forward elevation beyond 140 degrees.  I was unable to appreciate the popping sensation in clinic.  Internal rotation to the side of her hip.  Deltoid muscle fires.  Fingers are warm and well-perfused.  2+ radial  pulse.   IMAGING: I personally ordered and reviewed the following images:  XR of the Left shoulder were obtained in clinic today and demonstrates a reverse shoulder arthroplasty with implants in good position.  No evidence of acute injury or subsidence of implants.  No acute injuries.  Impression: Left shoulder arthroplasty in good position   Mordecai Rasmussen, MD 11/16/2021 9:25 AM

## 2021-11-17 ENCOUNTER — Encounter (HOSPITAL_COMMUNITY): Payer: Self-pay | Admitting: Occupational Therapy

## 2021-11-17 ENCOUNTER — Other Ambulatory Visit: Payer: Self-pay

## 2021-11-17 ENCOUNTER — Ambulatory Visit (HOSPITAL_COMMUNITY): Payer: Medicare Other | Admitting: Occupational Therapy

## 2021-11-17 DIAGNOSIS — R29898 Other symptoms and signs involving the musculoskeletal system: Secondary | ICD-10-CM

## 2021-11-17 DIAGNOSIS — M25612 Stiffness of left shoulder, not elsewhere classified: Secondary | ICD-10-CM

## 2021-11-17 DIAGNOSIS — M25512 Pain in left shoulder: Secondary | ICD-10-CM | POA: Diagnosis not present

## 2021-11-17 NOTE — Therapy (Signed)
[place], [place], [address] Phone: [phone]   Fax:  [phone]  Occupational Therapy Treatment  Patient Details  Name: Virginia [name] MRN: [id number] Date of Birth: 08-26-1946 Referring Provider (OT): Dr. Larena Glassman   Encounter Date: 11/17/2021   OT End of Session - 11/17/21 1429     Visit Number 13    Number of Visits 16    Date for OT Re-Evaluation 12/05/21    Authorization Type 1) Medicare A & B 2) BCBS    Authorization Time Period BCBS visit limit 61    Authorization - Visit Number 13    Authorization - Number of Visits 75    Progress Note Due on Visit 47    OT Start Time 1345    OT Stop Time 1429    OT Time Calculation (min) 44 min    Activity Tolerance Patient tolerated treatment well    Behavior During Therapy [facility] for tasks assessed/performed             Past Medical History:  Diagnosis Date   Anxiety    Depression    HTN (hypertension)    resolved with weight loss s/p gastric bypass   Hypothyroidism    Since at least 2007   Osteoporosis    RLS (restless legs syndrome)    Sleep apnea    Stroke [facility])    2013/February 2017    Past Surgical History:  Procedure Laterality Date   BREAST BIOPSY Right    benign   CATARACT EXTRACTION  2018   CHOLECYSTECTOMY     COLONOSCOPY  2004   internal hemorrhoids, left-sided diverticulae, splenic flexure polyp (81mm), path unavailable at this time   COLONOSCOPY  04/2011   RMR: Internal and external hemorrhoids, rectal tubular adenoma removed, left-sided diverticulosis   COLONOSCOPY N/A 06/01/2016   Procedure: COLONOSCOPY;  Surgeon: Daneil Dolin, MD;  Location: [facility];  Service: Endoscopy;  Laterality: N/A;  0830   ESOPHAGOGASTRODUODENOSCOPY  2004   small hiatal hernia   GASTRIC ROUX-EN-Y  2006   REVERSE SHOULDER ARTHROPLASTY Left 09/29/2021   Procedure: LEFT REVERSE SHOULDER ARTHROPLASTY;  Surgeon: Mordecai Rasmussen, MD;  Location: [facility]  ORS;  Service: Orthopedics;  Laterality: Left;    There were no vitals filed for this visit.   Subjective Assessment - 11/17/21 1343     Subjective  S: I have a sore spot but not that same catching place.    Currently in Pain? Yes    Pain Score 1     Pain Location Shoulder    Pain Orientation Left    Pain Descriptors / Indicators Aching;Sore    Pain Type Acute pain    Pain Radiating Towards N/A    Pain Onset In the past 7 days    Pain Frequency Intermittent    Aggravating Factors  movement, er    Pain Relieving Factors pain medication    Effect of Pain on Daily Activities mod effect on ADLs    Multiple Pain Sites No                [facility] OT Assessment - 11/17/21 1343       Assessment   Medical Diagnosis s/p left reverse TSA      Precautions   Precautions Shoulder    Type of Shoulder Precautions See protocol.  OT Treatments/Exercises (OP) - 11/17/21 1347       Exercises   Exercises Shoulder      Shoulder Exercises: Supine   Protraction PROM;5 reps    Horizontal ABduction PROM;5 reps    External Rotation PROM;5 reps    Internal Rotation PROM;5 reps    Flexion PROM;5 reps    ABduction PROM;5 reps      Shoulder Exercises: Standing   Protraction AAROM;15 reps    Horizontal ABduction AAROM;15 reps    External Rotation AAROM;15 reps    Internal Rotation AAROM;15 reps    Flexion AAROM;15 reps    ABduction AAROM;15 reps    Extension Theraband;10 reps    Theraband Level (Shoulder Extension) Level 2 (Red)    Row Theraband;10 reps    Theraband Level (Shoulder Row) Level 2 (Red)    Retraction Theraband;10 reps    Theraband Level (Shoulder Retraction) Level 2 (Red)      Shoulder Exercises: ROM/Strengthening   UBE (Upper Arm Bike) Level 1 2' forward 2' reverse, pace: 6.0    Wall Wash 1'    Other ROM/Strengthening Exercises proximal shoulder strengthening on door with washcloth, 30"      Modalities   Modalities Ultrasound       Ultrasound   Ultrasound Location anterior shoulder at incision    Ultrasound Parameters 1.5 W/cm2    Ultrasound Goals Pain;Other (Comment)   decrease adhesions     Manual Therapy   Manual Therapy Myofascial release    Manual therapy comments Manual therapy completed prior to exercises.    Myofascial Release Myofascial release and manual stretching completed to left upper arm, upper trapezius, and scapularis region to decrease fascial restrictions and increase joint mobility in a pain free zone.                      OT Short Term Goals - 10/07/21 1017       OT SHORT TERM GOAL #1   Title Pt will be provided with and educated on HEP to improve LUE mobility required for use during ADLs.    Time 4    Period Weeks    Status On-going    Target Date 11/05/21      OT SHORT TERM GOAL #2   Title Pt will increase LUE P/ROM to improve ability to donn and doff shirts independently.    Time 4    Period Weeks    Status On-going      OT SHORT TERM GOAL #3   Title Pt will increase LUE strength to 3/5 or greater to improve ability to reach items at waist to chest height during ADLs.    Time 4    Period Weeks    Status On-going               OT Long Term Goals - 10/07/21 1017       OT LONG TERM GOAL #1   Title Pt will decrease LUE pain to 3/10 or less to improve ability to sleep in the bed instead of the recliner.    Time 8    Period Weeks    Status On-going      OT LONG TERM GOAL #2   Title Pt will increase LUE A/ROM to [facility] to improve ability to perform reaching tasks overhead and behind back during ADLs.    Time 8    Period Weeks    Status On-going      OT LONG TERM GOAL #  3   Title Pt will decrease LUE fascial restrictions to min amounts or less to improve mobility required for functional reaching tasks.    Time 8    Period Weeks    Status On-going      OT LONG TERM GOAL #4   Title Pt will increase LUE strength to 4+/5 or greater to improve ability to  perform housekeeping tasks requiring lifting or pulling/pushing.    Time 8    Period Weeks    Status On-going      OT LONG TERM GOAL #5   Title Pt will achieve highest level of functioning using LUE as non-dominant during ADL and leisure tasks.    Time 8    Period Weeks    Status On-going                   Plan - 11/17/21 1429     Clinical Impression Statement A: Pt reports one sore spot today, not the painful catching of previous sessions. Continued with myofascial release to address fascial restrictions and [place] to address scar restrictions. P/ROM completed without painful catching today, continued with AA/ROM and scapular theraband. Added proximal shoulder strengthening on doorway, pt with mod/max fatigue after 30". Verbal cuing for form and technique.    Body Structure / Function / Physical Skills ADL;Endurance;Muscle spasms;UE functional use;Fascial restriction;Pain;ROM;IADL;Strength    Plan P: Continue [place]. progress to A/ROM supine, continue with scapular theraband and proximal shoulder strengthening    OT Home Exercise Plan eval: table slides, elbow flexion/extension, forearm supination/pronation 12/1: AA/ROM supine.    Consulted and Agree with Plan of Care Patient             Patient will benefit from skilled therapeutic intervention in order to improve the following deficits and impairments:   Body Structure / Function / Physical Skills: ADL, Endurance, Muscle spasms, UE functional use, Fascial restriction, Pain, ROM, IADL, Strength       Visit Diagnosis: Acute pain of left shoulder  Stiffness of left shoulder, not elsewhere classified  Other symptoms and signs involving the musculoskeletal system    Problem List Patient Active Problem List   Diagnosis Date Noted   Glenohumeral arthritis, left 09/29/2021   Intolerance of continuous positive airway pressure (CPAP) ventilation 02/17/2021   Status post bariatric surgery 02/17/2021   Periodic limb movement  disorder (PLMD) 02/17/2021   OSA (obstructive sleep apnea) 02/17/2021   RLS (restless legs syndrome) 01/22/2018   History of colonic polyps    Constipation 05/11/2016   Gait difficulty 01/04/2016   Aphasia 01/04/2016   Acute CVA (cerebrovascular accident) ([place]) 01/04/2016   CVA (cerebral infarction) 01/04/2016   Ataxia    OSA on CPAP 12/02/2013   CTS (carpal tunnel syndrome) 03/18/2013   Wrist fracture 02/04/2013   Distal radius fracture 12/26/2012   Hyperlipidemia 06/30/2012   Elevated blood pressure reading without diagnosis of hypertension 06/29/2012   Stroke ([place]) 06/28/2012   Hypothyroidism 06/28/2012   Anxiety 06/28/2012   Personal history of colonic polyps 04/26/2011   Family history of colonic polyps 04/26/2011   ARTHRITIS, LEFT KNEE 06/06/2010   DERANGEMENT OF POSTERIOR HORN OF MEDIAL MENISCUS 06/06/2010   ANEMIA, B12 DEFICIENCY 02/07/2010   TRIGGER FINGER, THUMB 02/07/2010   SHOULDER PAIN 10/14/2008   BURSITIS, SHOULDER 10/14/2008   TRIGGER FINGER 06/15/2008   Guadelupe Sabin, OTR/L  [phone] 11/17/2021, 2:31 PM  [place], [place], [address] Phone: [phone]   Fax:  [phone]  Name: AZALEE WEIMER MRN: [id number] Date of Birth: 23-Aug-1946

## 2021-11-18 ENCOUNTER — Ambulatory Visit
Admission: EM | Admit: 2021-11-18 | Discharge: 2021-11-18 | Disposition: A | Discharge status: Home / Self Care | Payer: Medicare Other | Attending: Physician Assistant | Admitting: Physician Assistant

## 2021-11-18 ENCOUNTER — Other Ambulatory Visit: Payer: Self-pay

## 2021-11-18 ENCOUNTER — Encounter: Payer: Self-pay | Admitting: Emergency Medicine

## 2021-11-18 DIAGNOSIS — U071 COVID-19: Secondary | ICD-10-CM | POA: Diagnosis not present

## 2021-11-18 MED ORDER — NIRMATRELVIR/RITONAVIR (PAXLOVID) TABLET (RENAL DOSING): 2.0000 | ORAL_TABLET | Freq: Two times a day (BID) | ORAL | 0 refills | Status: AC

## 2021-11-18 NOTE — ED Provider Notes (Signed)
[facility]    CSN: [id number] Arrival date & time: 11/18/21  1535      History   Chief Complaint Chief Complaint  Patient presents with   Nasal Congestion   Sore Throat    HPI Virginia [name] is a 75 y.o. female.   Pt had a positive covid test  The history is provided by the patient. No language interpreter was used.  Sore Throat  Cough Cough characteristics:  Non-productive Sputum characteristics:  Nondescript Severity:  Moderate Onset quality:  Gradual Timing:  Constant Progression:  Worsening Chronicity:  New Smoker: no   Relieved by:  Nothing Worsened by:  Nothing Ineffective treatments:  None tried Associated symptoms: sinus congestion    Past Medical History:  Diagnosis Date   Anxiety    Depression    HTN (hypertension)    resolved with weight loss s/p gastric bypass   Hypothyroidism    Since at least 2007   Osteoporosis    RLS (restless legs syndrome)    Sleep apnea    Stroke [facility])    2013/February 2017    Patient Active Problem List   Diagnosis Date Noted   Glenohumeral arthritis, left 09/29/2021   Intolerance of continuous positive airway pressure (CPAP) ventilation 02/17/2021   Status post bariatric surgery 02/17/2021   Periodic limb movement disorder (PLMD) 02/17/2021   OSA (obstructive sleep apnea) 02/17/2021   RLS (restless legs syndrome) 01/22/2018   History of colonic polyps    Constipation 05/11/2016   Gait difficulty 01/04/2016   Aphasia 01/04/2016   Acute CVA (cerebrovascular accident) ([place]) 01/04/2016   CVA (cerebral infarction) 01/04/2016   Ataxia    OSA on CPAP 12/02/2013   CTS (carpal tunnel syndrome) 03/18/2013   Wrist fracture 02/04/2013   Distal radius fracture 12/26/2012   Hyperlipidemia 06/30/2012   Elevated blood pressure reading without diagnosis of hypertension 06/29/2012   Stroke ([place]) 06/28/2012   Hypothyroidism 06/28/2012   Anxiety 06/28/2012   Personal history of colonic polyps 04/26/2011    Family history of colonic polyps 04/26/2011   ARTHRITIS, LEFT KNEE 06/06/2010   DERANGEMENT OF POSTERIOR HORN OF MEDIAL MENISCUS 06/06/2010   ANEMIA, B12 DEFICIENCY 02/07/2010   TRIGGER FINGER, THUMB 02/07/2010   SHOULDER PAIN 10/14/2008   BURSITIS, SHOULDER 10/14/2008   TRIGGER FINGER 06/15/2008    Past Surgical History:  Procedure Laterality Date   BREAST BIOPSY Right    benign   CATARACT EXTRACTION  2018   CHOLECYSTECTOMY     COLONOSCOPY  2004   internal hemorrhoids, left-sided diverticulae, splenic flexure polyp (60mm), path unavailable at this time   COLONOSCOPY  04/2011   RMR: Internal and external hemorrhoids, rectal tubular adenoma removed, left-sided diverticulosis   COLONOSCOPY N/A 06/01/2016   Procedure: COLONOSCOPY;  Surgeon: Daneil Dolin, MD;  Location[facility];  Service: Endoscopy;  Laterality: N/A;  0830   ESOPHAGOGASTRODUODENOSCOPY  2004   small hiatal hernia   GASTRIC ROUX-EN-Y  2006   REVERSE SHOULDER ARTHROPLASTY Left 09/29/2021   Procedure: LEFT REVERSE SHOULDER ARTHROPLASTY;  Surgeon: Mordecai Rasmussen, MD;  Location: [facility] ORS;  Service: Orthopedics;  Laterality: Left;    OB History   No obstetric history on file.      Home Medications    Prior to Admission medications   Medication Sig Start Date End Date Taking? Authorizing Provider  ALPRAZolam Duanne Moron) 0.5 MG tablet Take 0.5 mg by mouth at bedtime. Pt can take up to 3 tabs daily 03/27/11  Yes [provider]  amLODipine (NORVASC) 2.5 MG tablet Take 2.5 mg by mouth daily.   Yes [provider]  Ascorbic Acid (VITAMIN C) 1000 MG tablet Take 1,000 mg by mouth daily.   Yes [provider]  aspirin EC 81 MG tablet Take 81 mg by mouth daily.   Yes [provider]  atorvastatin (LIPITOR) 10 MG tablet Take 1 tablet (10 mg total) by mouth daily. Patient taking differently: Take 10 mg by mouth every evening. 01/22/18  Yes Dohmeier, Asencion Partridge, MD  b complex vitamins tablet Take 1  tablet by mouth daily.   Yes [provider]  Cholecalciferol (VITAMIN D-3) 1000 UNITS CAPS Take 1,000 Units by mouth daily.   Yes [provider]  DULoxetine (CYMBALTA) 60 MG capsule Take 60 mg by mouth daily.   Yes [provider]  losartan (COZAAR) 100 MG tablet TAKE 1 TABLET BY MOUTH EVERY DAY Patient taking differently: Take 100 mg by mouth daily. 08/05/19  Yes Herminio Commons, MD  Multiple Vitamins-Minerals (ICAPS AREDS 2 PO) Take 1 capsule by mouth in the morning and at bedtime.   Yes [provider]  nirmatrelvir/ritonavir EUA, renal dosing, (PAXLOVID) 10 x 150 MG & 10 x 100MG  TABS Take 2 tablets by mouth 2 (two) times daily for 5 days. Patient GFR is 55. Take nirmatrelvir (150 mg) one tablet twice daily for 5 days and ritonavir (100 mg) one tablet twice daily for 5 days. 11/18/21 11/23/21 Yes Fransico Meadow, PA-C  rOPINIRole (REQUIP) 0.5 MG tablet Take 1 tablet (0.5 mg total) by mouth at bedtime. 11/01/21  Yes Dohmeier, Asencion Partridge, MD  SYNTHROID 150 MCG tablet Take 150 mcg by mouth daily before breakfast. 07/17/21  Yes [provider]  docusate sodium (COLACE) 100 MG capsule Take 100 mg by mouth daily as needed for mild constipation.    [provider]  fluocinonide ointment (LIDEX) 0.98 % Apply 1 application topically 2 (two) times daily as needed (irritation).    [provider]  Multiple Vitamins-Minerals (MULTIVITAMIN WITH MINERALS) tablet Take 1 tablet by mouth daily.    [provider]    Family History Family History  Problem Relation Age of Onset   Lung cancer Father    Stroke Father    Stroke Mother    Stroke Brother    Hypertension Brother    Stroke Maternal Grandmother    Stroke Maternal Grandfather    Cancer Other        family history    Coronary artery disease Other        family history    Arthritis Other        family history    Cancer Daughter     Social History Social History    Tobacco Use   Smoking status: Former    Types: Cigarettes    Quit date: 11/28/1987    Years since quitting: 33.9   Smokeless tobacco: Never   Tobacco comments:    Quit x 25 plus years  Vaping Use   Vaping Use: Never used  Substance Use Topics   Alcohol use: No    Alcohol/week: 0.0 standard drinks   Drug use: No     Allergies   Augmentin [amoxicillin-pot clavulanate] and Amoxicillin-pot clavulanate   Review of Systems Review of Systems  Respiratory:  Positive for cough.   All other systems reviewed and are negative.   Physical Exam Triage Vital Signs ED Triage Vitals  Enc Vitals Group  BP 11/18/21 1553 (!) 142/78     Pulse Rate 11/18/21 1553 90     Resp 11/18/21 1553 16     Temp 11/18/21 1553 98.7 F (37.1 C)     Temp Source 11/18/21 1553 Oral     SpO2 11/18/21 1553 96 %     Weight --      Height --      Head Circumference --      Peak Flow --      Pain Score 11/18/21 1554 2     Pain Loc --      Pain Edu? --      Excl. in [place]? --    No data found.  Updated Vital Signs BP (!) 142/78 (BP Location: Right Arm)    Pulse 90    Temp 98.7 F (37.1 C) (Oral)    Resp 16    SpO2 96%   Visual Acuity Right Eye Distance:   Left Eye Distance:   Bilateral Distance:    Right Eye Near:   Left Eye Near:    Bilateral Near:     Physical Exam Vitals and nursing note reviewed.  Constitutional:      Appearance: She is well-developed.  HENT:     Head: Normocephalic.  Pulmonary:     Effort: Pulmonary effort is normal.  Abdominal:     General: There is no distension.  Musculoskeletal:        General: Normal range of motion.     Cervical back: Normal range of motion.  Neurological:     Mental Status: She is alert and oriented to person, place, and time.     UC Treatments / Results  Labs (all labs ordered are listed, but only abnormal results are displayed) Labs Reviewed - No data to display  EKG   Radiology No results found.  Procedures Procedures  (including critical care time)  Medications Ordered in UC Medications - No data to display  Initial Impression / Assessment and Plan / UC Course  I have reviewed the triage vital signs and the nursing notes.  Pertinent labs & imaging results that were available during my care of the patient were reviewed by me and considered in my medical decision making (see chart for details).     MDM:  Pt had bmet in November renal dosage of paxlovid Final Clinical Impressions(s) / UC Diagnoses   Final diagnoses:  COVID   Discharge Instructions   None    ED Prescriptions     Medication Sig Dispense Auth. Provider   nirmatrelvir/ritonavir EUA, renal dosing, (PAXLOVID) 10 x 150 MG & 10 x 100MG  TABS Take 2 tablets by mouth 2 (two) times daily for 5 days. Patient GFR is 55. Take nirmatrelvir (150 mg) one tablet twice daily for 5 days and ritonavir (100 mg) one tablet twice daily for 5 days. 20 tablet Fransico Meadow, [place]      PDMP not reviewed this encounter.   Fransico Meadow, [place] 11/18/21 1840

## 2021-11-18 NOTE — ED Triage Notes (Signed)
Patient c/o nasal congestion and sore throat x 1 day.   Patient denies fever at home.   Patient endorses generalized body aches.   Patient has positive COVID test today.

## 2021-11-22 ENCOUNTER — Telehealth (HOSPITAL_COMMUNITY): Payer: Self-pay | Admitting: Occupational Therapy

## 2021-11-22 ENCOUNTER — Ambulatory Visit (HOSPITAL_COMMUNITY): Payer: Medicare Other | Admitting: Occupational Therapy

## 2021-11-22 DIAGNOSIS — Z20822 Contact with and (suspected) exposure to covid-19: Secondary | ICD-10-CM | POA: Diagnosis not present

## 2021-11-22 NOTE — Telephone Encounter (Signed)
Pt l/m to cx  11/22/2021 no reason given

## 2021-11-24 ENCOUNTER — Ambulatory Visit (HOSPITAL_COMMUNITY): Payer: Medicare Other | Admitting: Occupational Therapy

## 2021-11-29 ENCOUNTER — Ambulatory Visit (HOSPITAL_COMMUNITY): Payer: Medicare Other | Attending: Orthopedic Surgery

## 2021-11-29 ENCOUNTER — Other Ambulatory Visit: Payer: Self-pay

## 2021-11-29 ENCOUNTER — Encounter (HOSPITAL_COMMUNITY): Payer: Self-pay

## 2021-11-29 DIAGNOSIS — M25612 Stiffness of left shoulder, not elsewhere classified: Secondary | ICD-10-CM | POA: Insufficient documentation

## 2021-11-29 DIAGNOSIS — M25512 Pain in left shoulder: Secondary | ICD-10-CM | POA: Insufficient documentation

## 2021-11-29 DIAGNOSIS — R29898 Other symptoms and signs involving the musculoskeletal system: Secondary | ICD-10-CM | POA: Insufficient documentation

## 2021-11-29 NOTE — Therapy (Signed)
[place], [place], [address] Phone: [phone]   Fax:  [phone]  Occupational Therapy Treatment  Patient Details  Name: Virginia [name] MRN: [id number] Date of Birth: 08/04/1946 Referring Provider (OT): Dr. Larena Glassman   Encounter Date: 11/29/2021   OT End of Session - 11/29/21 0841     Visit Number 14    Number of Visits 16    Date for OT Re-Evaluation 12/05/21    Authorization Type 1) Medicare A & B 2) BCBS    Authorization Time Period BCBS visit limit 73    Authorization - Visit Number 1    Authorization - Number of Visits 75    Progress Note Due on Visit 58    OT Start Time 0815    OT Stop Time 6812    OT Time Calculation (min) 38 min    Activity Tolerance Patient tolerated treatment well    Behavior During Therapy [facility] for tasks assessed/performed             Past Medical History:  Diagnosis Date   Anxiety    Depression    HTN (hypertension)    resolved with weight loss s/p gastric bypass   Hypothyroidism    Since at least 2007   Osteoporosis    RLS (restless legs syndrome)    Sleep apnea    Stroke [facility]    2013/February 2017    Past Surgical History:  Procedure Laterality Date   BREAST BIOPSY Right    benign   CATARACT EXTRACTION  2018   CHOLECYSTECTOMY     COLONOSCOPY  2004   internal hemorrhoids, left-sided diverticulae, splenic flexure polyp (28mm), path unavailable at this time   COLONOSCOPY  04/2011   RMR: Internal and external hemorrhoids, rectal tubular adenoma removed, left-sided diverticulosis   COLONOSCOPY N/A 06/01/2016   Procedure: COLONOSCOPY;  Surgeon: Daneil Dolin, MD;  Location[facility];  Service: Endoscopy;  Laterality: N/A;  0830   ESOPHAGOGASTRODUODENOSCOPY  2004   small hiatal hernia   GASTRIC ROUX-EN-Y  2006   REVERSE SHOULDER ARTHROPLASTY Left 09/29/2021   Procedure: LEFT REVERSE SHOULDER ARTHROPLASTY;  Surgeon: Mordecai Rasmussen, MD;  Location: [facility] OR[facility];   Service: Orthopedics;  Laterality: Left;    There were no vitals filed for this visit.   Subjective Assessment - 11/29/21 0839     Subjective  S: I go back to work today.    Currently in Pain? Yes    Pain Score 1     Pain Location Shoulder    Pain Orientation Left    Pain Descriptors / Indicators Sore    Pain Type Acute pain    Pain Onset In the past 7 days    Pain Frequency Constant    Aggravating Factors  certain movements    Pain Relieving Factors pain medication    Effect of Pain on Daily Activities min-mod effect                [facility] OT Assessment - 11/29/21 0840       Assessment   Medical Diagnosis s/p left reverse TSA      Precautions   Precautions Shoulder    Type of Shoulder Precautions See protocol.                      OT Treatments/Exercises (OP) - 11/29/21 0840       Exercises   Exercises Shoulder  Shoulder Exercises: Supine   Protraction PROM;5 reps;AROM;10 reps    Horizontal ABduction PROM;5 reps;AROM   8X   External Rotation PROM;5 reps;AROM;10 reps    Internal Rotation PROM;5 reps;AROM;10 reps    Flexion PROM;5 reps;AROM;10 reps    ABduction PROM;5 reps      Shoulder Exercises: Standing   Extension Theraband;10 reps    Theraband Level (Shoulder Extension) Level 2 (Red)    Row Theraband;10 reps    Theraband Level (Shoulder Row) Level 2 (Red)    Retraction Theraband;10 reps    Theraband Level (Shoulder Retraction) Level 2 (Red)      Shoulder Exercises: ROM/Strengthening   Other ROM/Strengthening Exercises proximal shoulder strengthening on door with washcloth, 30"      Modalities   Modalities Ultrasound      Ultrasound   Ultrasound Location anterior shoulder at incision    Ultrasound Parameters 1.5 W/cm2    Ultrasound Goals Pain;Other (Comment)   decrease adhesions                     OT Short Term Goals - 10/07/21 1017       OT SHORT TERM GOAL #1   Title Pt will be provided with and educated on  HEP to improve LUE mobility required for use during ADLs.    Time 4    Period Weeks    Status On-going    Target Date 11/05/21      OT SHORT TERM GOAL #2   Title Pt will increase LUE P/ROM to improve ability to donn and doff shirts independently.    Time 4    Period Weeks    Status On-going      OT SHORT TERM GOAL #3   Title Pt will increase LUE strength to 3/5 or greater to improve ability to reach items at waist to chest height during ADLs.    Time 4    Period Weeks    Status On-going               OT Long Term Goals - 10/07/21 1017       OT LONG TERM GOAL #1   Title Pt will decrease LUE pain to 3/10 or less to improve ability to sleep in the bed instead of the recliner.    Time 8    Period Weeks    Status On-going      OT LONG TERM GOAL #2   Title Pt will increase LUE A/ROM to [facility] to improve ability to perform reaching tasks overhead and behind back during ADLs.    Time 8    Period Weeks    Status On-going      OT LONG TERM GOAL #3   Title Pt will decrease LUE fascial restrictions to min amounts or less to improve mobility required for functional reaching tasks.    Time 8    Period Weeks    Status On-going      OT LONG TERM GOAL #4   Title Pt will increase LUE strength to 4+/5 or greater to improve ability to perform housekeeping tasks requiring lifting or pulling/pushing.    Time 8    Period Weeks    Status On-going      OT LONG TERM GOAL #5   Title Pt will achieve highest level of functioning using LUE as non-dominant during ADL and leisure tasks.    Time 8    Period Weeks    Status On-going  Plan - 11/29/21 0841     Clinical Impression Statement A: Completed US at start of session to work on decreasing adhesions and pain/soreness. Passive stretching completed with patient progressing to A/ROM supine with moderate difficulty. Did not complete abduction this session due to pain and muscle weakness. Will progress this movement  when ready. Pain prevented her from completing full 10 repetitions with horizontal abduction. VC for form and technique were provided during session. Pt experienced muscle fatigue during proximal shoulder strengthening activity although was able to complete 30" as before.    Body Structure / Function / Physical Skills ADL;Endurance;Muscle spasms;UE functional use;Fascial restriction;Pain;ROM;IADL;Strength    Plan P: Reassess and re-cert next session. Continue to work on progressing A/ROM. Provide to HEP when appropriate.    Consulted and Agree with Plan of Care Patient             Patient will benefit from skilled therapeutic intervention in order to improve the following deficits and impairments:   Body Structure / Function / Physical Skills: ADL, Endurance, Muscle spasms, UE functional use, Fascial restriction, Pain, ROM, IADL, Strength       Visit Diagnosis: Stiffness of left shoulder, not elsewhere classified  Acute pain of left shoulder  Other symptoms and signs involving the musculoskeletal system    Problem List Patient Active Problem List   Diagnosis Date Noted   Glenohumeral arthritis, left 09/29/2021   Intolerance of continuous positive airway pressure (CPAP) ventilation 02/17/2021   Status post bariatric surgery 02/17/2021   Periodic limb movement disorder (PLMD) 02/17/2021   OSA (obstructive sleep apnea) 02/17/2021   RLS (restless legs syndrome) 01/22/2018   History of colonic polyps    Constipation 05/11/2016   Gait difficulty 01/04/2016   Aphasia 01/04/2016   Acute CVA (cerebrovascular accident) ([place]) 01/04/2016   CVA (cerebral infarction) 01/04/2016   Ataxia    OSA on CPAP 12/02/2013   CTS (carpal tunnel syndrome) 03/18/2013   Wrist fracture 02/04/2013   Distal radius fracture 12/26/2012   Hyperlipidemia 06/30/2012   Elevated blood pressure reading without diagnosis of hypertension 06/29/2012   Stroke ([place]) 06/28/2012   Hypothyroidism 06/28/2012    Anxiety 06/28/2012   Personal history of colonic polyps 04/26/2011   Family history of colonic polyps 04/26/2011   ARTHRITIS, LEFT KNEE 06/06/2010   DERANGEMENT OF POSTERIOR HORN OF MEDIAL MENISCUS 06/06/2010   ANEMIA, B12 DEFICIENCY 02/07/2010   TRIGGER FINGER, THUMB 02/07/2010   SHOULDER PAIN 10/14/2008   BURSITIS, SHOULDER 10/14/2008   TRIGGER FINGER 06/15/2008    Ailene Ravel, OTR/L,CBIS  [phone]  11/29/2021, 9:16 AM  [place], [place], [address] Phone: [phone]   Fax:  [phone]  Name: KAYDIN LABO MRN: [id number] Date of Birth: 02/12/1946

## 2021-12-01 ENCOUNTER — Ambulatory Visit (HOSPITAL_COMMUNITY): Payer: Medicare Other | Admitting: Occupational Therapy

## 2021-12-01 ENCOUNTER — Encounter (HOSPITAL_COMMUNITY): Payer: Self-pay | Admitting: Occupational Therapy

## 2021-12-01 ENCOUNTER — Other Ambulatory Visit: Payer: Self-pay

## 2021-12-01 DIAGNOSIS — M25612 Stiffness of left shoulder, not elsewhere classified: Secondary | ICD-10-CM | POA: Diagnosis not present

## 2021-12-01 DIAGNOSIS — M25512 Pain in left shoulder: Secondary | ICD-10-CM

## 2021-12-01 DIAGNOSIS — R29898 Other symptoms and signs involving the musculoskeletal system: Secondary | ICD-10-CM

## 2021-12-01 NOTE — Therapy (Addendum)
[place], [place], [address] Phone: [phone]   Fax:  [phone]  Occupational Therapy Reassessment, Treatment Recertification  Patient Details  Name: Virginia [name] MRN: [id number] Date of Birth: 1946/05/19 Referring Provider (OT): Dr. Larena Glassman   Progress Note Reporting Period 11/08/21 to 12/01/21  See note below for Objective Data and Assessment of Progress/Goals.      Encounter Date: 12/01/2021   OT End of Session - 12/01/21 0857     Visit Number 15    Number of Visits 23    Date for OT Re-Evaluation 12/31/21    Authorization Type 1) Medicare A & B 2) BCBS    Authorization Time Period BCBS visit limit 11    Authorization - Visit Number 2    Authorization - Number of Visits 75    Progress Note Due on Visit 25    OT Start Time 0813    OT Stop Time 1324    OT Time Calculation (min) 45 min    Activity Tolerance Patient tolerated treatment well    Behavior During Therapy WFL for tasks assessed/performed             Past Medical History:  Diagnosis Date   Anxiety    Depression    HTN (hypertension)    resolved with weight loss s/p gastric bypass   Hypothyroidism    Since at least 2007   Osteoporosis    RLS (restless legs syndrome)    Sleep apnea    Stroke [facility])    2013/February 2017    Past Surgical History:  Procedure Laterality Date   BREAST BIOPSY Right    benign   CATARACT EXTRACTION  2018   CHOLECYSTECTOMY     COLONOSCOPY  2004   internal hemorrhoids, left-sided diverticulae, splenic flexure polyp (28m), path unavailable at this time   COLONOSCOPY  04/2011   RMR: Internal and external hemorrhoids, rectal tubular adenoma removed, left-sided diverticulosis   COLONOSCOPY N/A 06/01/2016   Procedure: COLONOSCOPY;  Surgeon: RDaneil Dolin MD;  Location[facility];  Service: Endoscopy;  Laterality: N/A;  0830   ESOPHAGOGASTRODUODENOSCOPY  2004   small hiatal hernia   GASTRIC  ROUX-EN-Y  2006   REVERSE SHOULDER ARTHROPLASTY Left 09/29/2021   Procedure: LEFT REVERSE SHOULDER ARTHROPLASTY;  Surgeon: CMordecai Rasmussen MD;  Location: [facility];  Service: Orthopedics;  Laterality: Left;    There were no vitals filed for this visit.   Subjective Assessment - 12/01/21 0813     Subjective  S: It's about the same.    Currently in Pain? Yes    Pain Score 1     Pain Location Shoulder    Pain Orientation Left    Pain Descriptors / Indicators Sore    Pain Type Acute pain    Pain Radiating Towards N/A    Pain Onset In the past 7 days    Pain Frequency Constant    Aggravating Factors  certain movements    Pain Relieving Factors pain medication    Effect of Pain on Daily Activities min-mod effect    Multiple Pain Sites No                [facility] OT Assessment - 12/01/21 0812       Assessment   Medical Diagnosis s/p left reverse TSA      Precautions   Precautions Shoulder    Type of Shoulder Precautions See protocol.  Observation/Other Assessments   Focus on Therapeutic Outcomes (FOTO)  29/100   45/100 previous     Palpation   Palpation comment Mod fascial restrictions along left upper arm, trapezius, and scapular regions      AROM   Overall AROM Comments Assessed seated, er/IR adducted    AROM Assessment Site Shoulder    Right/Left Shoulder Left    Left Shoulder Flexion 90 Degrees   same as previous   Left Shoulder ABduction 100 Degrees   81 previous   Left Shoulder Internal Rotation 90 Degrees   same as previous   Left Shoulder External Rotation 30 Degrees   28 previous     PROM   Overall PROM Comments Assessed supine, er/IR adducted    PROM Assessment Site Shoulder    Right/Left Shoulder Left    Left Shoulder Flexion 138 Degrees   126 previous   Left Shoulder ABduction 145 Degrees   140 previous   Left Shoulder Internal Rotation 90 Degrees   same as previous   Left Shoulder External Rotation 43 Degrees   30 previous     Strength   Overall  Strength Comments Assessed seated, er/IR adducted. Assessed via observation, no formal MMT testing    Strength Assessment Site Shoulder    Right/Left Shoulder Left    Left Shoulder Flexion 3-/5   same as previous   Left Shoulder ABduction 3-/5   same as previous   Left Shoulder Internal Rotation 3/5   same as previous   Left Shoulder External Rotation 3-/5   same as previous                     OT Treatments/Exercises (OP) - 12/01/21 0815       Exercises   Exercises Shoulder      Shoulder Exercises: Supine   Protraction PROM;5 reps    Horizontal ABduction PROM;5 reps    External Rotation PROM;5 reps    Internal Rotation PROM;5 reps    Flexion PROM;5 reps    ABduction PROM;5 reps      Shoulder Exercises: Seated   Protraction AROM;10 reps    External Rotation AROM;10 reps    Internal Rotation AROM;10 reps    Flexion AROM;10 reps    Abduction AROM;10 reps      Shoulder Exercises: ROM/Strengthening   Other ROM/Strengthening Exercises proximal shoulder strengthening on door with washcloth, 45"      Modalities   Modalities Ultrasound      Ultrasound   Ultrasound Location anterior shoulder    Ultrasound Parameters 1.5 W/cm2    Ultrasound Goals Pain;Other (Comment)   decrease adhesions     Manual Therapy   Manual Therapy Myofascial release    Manual therapy comments Manual therapy completed prior to exercises.    Myofascial Release Myofascial release and manual stretching completed to left upper arm, upper trapezius, and scapularis region to decrease fascial restrictions and increase joint mobility in a pain free zone.                      OT Short Term Goals - 12/01/21 0848       OT SHORT TERM GOAL #1   Title Pt will be provided with and educated on HEP to improve LUE mobility required for use during ADLs.    Time 4    Period Weeks    Status Achieved    Target Date 11/05/21      OT SHORT TERM GOAL #2  Title Pt will increase LUE P/ROM to  improve ability to donn and doff shirts independently.    Time 4    Period Weeks    Status Achieved      OT SHORT TERM GOAL #3   Title Pt will increase LUE strength to 3/5 or greater to improve ability to reach items at waist to chest height during ADLs.    Time 4    Period Weeks    Status Partially Met               OT Long Term Goals - 12/01/21 0848       OT LONG TERM GOAL #1   Title Pt will decrease LUE pain to 3/10 or less to improve ability to sleep in the bed instead of the recliner.    Time 8    Period Weeks    Status Achieved      OT LONG TERM GOAL #2   Title Pt will increase LUE A/ROM to [facility] to improve ability to perform reaching tasks overhead and behind back during ADLs.    Time 8    Period Weeks    Status On-going      OT LONG TERM GOAL #3   Title Pt will decrease LUE fascial restrictions to min amounts or less to improve mobility required for functional reaching tasks.    Time 8    Period Weeks    Status On-going      OT LONG TERM GOAL #4   Title Pt will increase LUE strength to 4+/5 or greater to improve ability to perform housekeeping tasks requiring lifting or pulling/pushing.    Time 8    Period Weeks    Status On-going      OT LONG TERM GOAL #5   Title Pt will achieve highest level of functioning using LUE as non-dominant during ADL and leisure tasks.    Time 8    Period Weeks    Status On-going                   Plan - 12/01/21 0849     Clinical Impression Statement A: Reassessment completed this session, pt has met 2/3 STGs with an additional STG partially met, and has met 1 LTG thus far. Pt has made improvements in her ROM, has just progressed to A/ROM and is working towards improving her strength. Pt reports she is working to incorporate her LUE into her ADL tasks. Continued with myofascial release and [place] today to address fascial and scar restrictions. Continued with A/ROM in sitting, pt demonstrating improved tolerance by  completing all 10 repetitions of each task.    OT Occupational Profile and History Problem Focused Assessment - Including review of records relating to presenting problem    Occupational performance deficits (Please refer to evaluation for details): ADL's;IADL's;Rest and Sleep;Leisure    Body Structure / Function / Physical Skills ADL;Endurance;Muscle spasms;UE functional use;Fascial restriction;Pain;ROM;IADL;Strength    Rehab Potential Good    Clinical Decision Making Limited treatment options, no task modification necessary    Comorbidities Affecting Occupational Performance: None    Modification or Assistance to Complete Evaluation  No modification of tasks or assist necessary to complete eval    OT Frequency 2x / week    OT Duration 4 weeks    OT Treatment/Interventions Self-care/ADL training;Ultrasound;DME and/or AE instruction;Patient/family education;Scar mobilization;Passive range of motion;Cryotherapy;Electrical Stimulation;Moist Heat;Therapeutic exercise;Manual Therapy;Therapeutic activities    Plan P: Pt will benefit from continued skilled OT services  to improve LUE ROM, strength, and functioning during daily tasks. Continue with A/ROM and update HEP for A/ROM    OT Home Exercise Plan eval: table slides, elbow flexion/extension, forearm supination/pronation 12/1: AA/ROM supine.    Consulted and Agree with Plan of Care Patient             Patient will benefit from skilled therapeutic intervention in order to improve the following deficits and impairments:   Body Structure / Function / Physical Skills: ADL, Endurance, Muscle spasms, UE functional use, Fascial restriction, Pain, ROM, IADL, Strength       Visit Diagnosis: Stiffness of left shoulder, not elsewhere classified  Acute pain of left shoulder  Other symptoms and signs involving the musculoskeletal system    Problem List Patient Active Problem List   Diagnosis Date Noted   Glenohumeral arthritis, left 09/29/2021    Intolerance of continuous positive airway pressure (CPAP) ventilation 02/17/2021   Status post bariatric surgery 02/17/2021   Periodic limb movement disorder (PLMD) 02/17/2021   OSA (obstructive sleep apnea) 02/17/2021   RLS (restless legs syndrome) 01/22/2018   History of colonic polyps    Constipation 05/11/2016   Gait difficulty 01/04/2016   Aphasia 01/04/2016   Acute CVA (cerebrovascular accident) ([place]) 01/04/2016   CVA (cerebral infarction) 01/04/2016   Ataxia    OSA on CPAP 12/02/2013   CTS (carpal tunnel syndrome) 03/18/2013   Wrist fracture 02/04/2013   Distal radius fracture 12/26/2012   Hyperlipidemia 06/30/2012   Elevated blood pressure reading without diagnosis of hypertension 06/29/2012   Stroke ([place]) 06/28/2012   Hypothyroidism 06/28/2012   Anxiety 06/28/2012   Personal history of colonic polyps 04/26/2011   Family history of colonic polyps 04/26/2011   ARTHRITIS, LEFT KNEE 06/06/2010   DERANGEMENT OF POSTERIOR HORN OF MEDIAL MENISCUS 06/06/2010   ANEMIA, B12 DEFICIENCY 02/07/2010   TRIGGER FINGER, THUMB 02/07/2010   SHOULDER PAIN 10/14/2008   BURSITIS, SHOULDER 10/14/2008   TRIGGER FINGER 06/15/2008    Guadelupe Sabin, OTR/L  [phone] 12/01/2021, 9:00 AM  [place], [place], [address] Phone: [phone]   Fax:  [phone]  Name: Virginia [name] MRN: [id number] Date of Birth: October 26, 1946

## 2021-12-07 ENCOUNTER — Ambulatory Visit (HOSPITAL_COMMUNITY): Payer: Medicare Other | Admitting: Occupational Therapy

## 2021-12-07 ENCOUNTER — Other Ambulatory Visit: Payer: Self-pay

## 2021-12-07 ENCOUNTER — Encounter (HOSPITAL_COMMUNITY): Payer: Self-pay | Admitting: Occupational Therapy

## 2021-12-07 DIAGNOSIS — M25512 Pain in left shoulder: Secondary | ICD-10-CM | POA: Diagnosis not present

## 2021-12-07 DIAGNOSIS — R29898 Other symptoms and signs involving the musculoskeletal system: Secondary | ICD-10-CM | POA: Diagnosis not present

## 2021-12-07 DIAGNOSIS — M25612 Stiffness of left shoulder, not elsewhere classified: Secondary | ICD-10-CM

## 2021-12-07 NOTE — Therapy (Signed)
[place], [place], [address] Phone: [phone]   Fax:  [phone]  Occupational Therapy Treatment  Patient Details  Name: Virginia [name] MRN: [id number] Date of Birth: Nov 25, 1946 Referring Provider (OT): Dr. Larena Glassman   Encounter Date: 12/07/2021   OT End of Session - 12/07/21 0854     Visit Number 16    Number of Visits 23    Date for OT Re-Evaluation 12/31/21    Authorization Type 1) Medicare A & B 2) BCBS    Authorization Time Period BCBS visit limit 62    Authorization - Visit Number 3    Authorization - Number of Visits 75    Progress Note Due on Visit 25    OT Start Time 0815    OT Stop Time 7619    OT Time Calculation (min) 42 min    Activity Tolerance Patient tolerated treatment well    Behavior During Therapy [facility] for tasks assessed/performed             Past Medical History:  Diagnosis Date   Anxiety    Depression    HTN (hypertension)    resolved with weight loss s/p gastric bypass   Hypothyroidism    Since at least 2007   Osteoporosis    RLS (restless legs syndrome)    Sleep apnea    Stroke [facility])    2013/February 2017    Past Surgical History:  Procedure Laterality Date   BREAST BIOPSY Right    benign   CATARACT EXTRACTION  2018   CHOLECYSTECTOMY     COLONOSCOPY  2004   internal hemorrhoids, left-sided diverticulae, splenic flexure polyp (16m), path unavailable at this time   COLONOSCOPY  04/2011   RMR: Internal and external hemorrhoids, rectal tubular adenoma removed, left-sided diverticulosis   COLONOSCOPY N/A 06/01/2016   Procedure: COLONOSCOPY;  Surgeon: RDaneil Dolin MD;  Location: [facility] ENDO [facility];  Service: Endoscopy;  Laterality: N/A;  0830   ESOPHAGOGASTRODUODENOSCOPY  2004   small hiatal hernia   GASTRIC ROUX-EN-Y  2006   REVERSE SHOULDER ARTHROPLASTY Left 09/29/2021   Procedure: LEFT REVERSE SHOULDER ARTHROPLASTY;  Surgeon: CMordecai Rasmussen MD;  Location: [facility]  ORS;  Service: Orthopedics;  Laterality: Left;    There were no vitals filed for this visit.   Subjective Assessment - 12/07/21 0810     Subjective  S: I can close my car door with my bad arm now.    Currently in Pain? No/denies                [facility]       Assessment   Medical Diagnosis s/p left reverse TSA      Precautions   Precautions Shoulder    Type of Shoulder Precautions See protocol.                      OT Treatments/Exercises (OP) - 12/07/21 0817       Exercises   Exercises Shoulder      Shoulder Exercises: Supine   Protraction PROM;5 reps;AROM;10 reps    Horizontal ABduction PROM;5 reps;AROM;10 reps    External Rotation PROM;5 reps;AROM;10 reps    Internal Rotation PROM;5 reps;AROM;10 reps    Flexion PROM;5 reps;AROM;10 reps    ABduction PROM;5 reps;AROM;10 reps      Shoulder Exercises: Standing   Protraction AROM;10 reps    Horizontal ABduction AROM;10 reps  External Rotation AROM;10 reps    Internal Rotation AROM;10 reps    Flexion AROM;10 reps    ABduction AROM;10 reps      Modalities   Modalities Ultrasound      Ultrasound   Ultrasound Location anterior shoulder    Ultrasound Parameters 1.5 W/cm2    Ultrasound Goals Pain;Other (Comment)   decrease adhesions     Manual Therapy   Manual Therapy Myofascial release    Manual therapy comments Manual therapy completed prior to exercises.    Myofascial Release Myofascial release and manual stretching completed to left upper arm, upper trapezius, and scapularis region to decrease fascial restrictions and increase joint mobility in a pain free zone.                    OT Education - 12/07/21 0848     Education Details shoulder A/ROM    Person(s) Educated Patient    Methods Explanation;Handout;Demonstration;Verbal cues    Comprehension Returned demonstration;Verbalized understanding              OT Short Term Goals - 12/01/21 0848        OT SHORT TERM GOAL #1   Title Pt will be provided with and educated on HEP to improve LUE mobility required for use during ADLs.    Time 4    Period Weeks    Status Achieved    Target Date 11/05/21      OT SHORT TERM GOAL #2   Title Pt will increase LUE P/ROM to improve ability to donn and doff shirts independently.    Time 4    Period Weeks    Status Achieved      OT SHORT TERM GOAL #3   Title Pt will increase LUE strength to 3/5 or greater to improve ability to reach items at waist to chest height during ADLs.    Time 4    Period Weeks    Status Partially Met               OT Long Term Goals - 12/01/21 0848       OT LONG TERM GOAL #1   Title Pt will decrease LUE pain to 3/10 or less to improve ability to sleep in the bed instead of the recliner.    Time 8    Period Weeks    Status Achieved      OT LONG TERM GOAL #2   Title Pt will increase LUE A/ROM to [facility] to improve ability to perform reaching tasks overhead and behind back during ADLs.    Time 8    Period Weeks    Status On-going      OT LONG TERM GOAL #3   Title Pt will decrease LUE fascial restrictions to min amounts or less to improve mobility required for functional reaching tasks.    Time 8    Period Weeks    Status On-going      OT LONG TERM GOAL #4   Title Pt will increase LUE strength to 4+/5 or greater to improve ability to perform housekeeping tasks requiring lifting or pulling/pushing.    Time 8    Period Weeks    Status On-going      OT LONG TERM GOAL #5   Title Pt will achieve highest level of functioning using LUE as non-dominant during ADL and leisure tasks.    Time 8    Period Weeks    Status On-going  Plan - 12/07/21 0843     Clinical Impression Statement A: Pt reports she is able to shut her car door now and is lifting her arm with improved ease. Continued with myofascial release and [place] to address fascial restrictions. Continued with A/ROM today  working on improved strength and ROM. Updated HEP for A/ROM. Verbal cuing for form and technique.    Body Structure / Function / Physical Skills ADL;Endurance;Muscle spasms;UE functional use;Fascial restriction;Pain;ROM;IADL;Strength    Plan P: follow up on HEP, add proximal shoulder strengthening in supine, resume scapular theraband and add to HEP if form is appropriate    OT Home Exercise Plan eval: table slides, elbow flexion/extension, forearm supination/pronation 12/1: AA/ROM supine; 1/11: A/ROM    Consulted and Agree with Plan of Care Patient             Patient will benefit from skilled therapeutic intervention in order to improve the following deficits and impairments:   Body Structure / Function / Physical Skills: ADL, Endurance, Muscle spasms, UE functional use, Fascial restriction, Pain, ROM, IADL, Strength       Visit Diagnosis: Stiffness of left shoulder, not elsewhere classified  Acute pain of left shoulder  Other symptoms and signs involving the musculoskeletal system    Problem List Patient Active Problem List   Diagnosis Date Noted   Glenohumeral arthritis, left 09/29/2021   Intolerance of continuous positive airway pressure (CPAP) ventilation 02/17/2021   Status post bariatric surgery 02/17/2021   Periodic limb movement disorder (PLMD) 02/17/2021   OSA (obstructive sleep apnea) 02/17/2021   RLS (restless legs syndrome) 01/22/2018   History of colonic polyps    Constipation 05/11/2016   Gait difficulty 01/04/2016   Aphasia 01/04/2016   Acute CVA (cerebrovascular accident) ([place]) 01/04/2016   CVA (cerebral infarction) 01/04/2016   Ataxia    OSA on CPAP 12/02/2013   CTS (carpal tunnel syndrome) 03/18/2013   Wrist fracture 02/04/2013   Distal radius fracture 12/26/2012   Hyperlipidemia 06/30/2012   Elevated blood pressure reading without diagnosis of hypertension 06/29/2012   Stroke ([place]) 06/28/2012   Hypothyroidism 06/28/2012   Anxiety 06/28/2012    Personal history of colonic polyps 04/26/2011   Family history of colonic polyps 04/26/2011   ARTHRITIS, LEFT KNEE 06/06/2010   DERANGEMENT OF POSTERIOR HORN OF MEDIAL MENISCUS 06/06/2010   ANEMIA, B12 DEFICIENCY 02/07/2010   TRIGGER FINGER, THUMB 02/07/2010   SHOULDER PAIN 10/14/2008   BURSITIS, SHOULDER 10/14/2008   TRIGGER FINGER 06/15/2008    Guadelupe Sabin, OTR/L  [phone] 12/07/2021, 8:58 AM  [place], [place], [address] Phone: [phone]   Fax:  [phone]  Name: Virginia [name] MRN: [id number] Date of Birth: 1946/06/24

## 2021-12-07 NOTE — Patient Instructions (Signed)

## 2021-12-08 DIAGNOSIS — Z20822 Contact with and (suspected) exposure to covid-19: Secondary | ICD-10-CM | POA: Diagnosis not present

## 2021-12-09 ENCOUNTER — Other Ambulatory Visit: Payer: Self-pay

## 2021-12-09 ENCOUNTER — Encounter (HOSPITAL_COMMUNITY): Payer: Self-pay | Admitting: Occupational Therapy

## 2021-12-09 ENCOUNTER — Ambulatory Visit (HOSPITAL_COMMUNITY): Payer: Medicare Other | Admitting: Occupational Therapy

## 2021-12-09 DIAGNOSIS — M25612 Stiffness of left shoulder, not elsewhere classified: Secondary | ICD-10-CM | POA: Diagnosis not present

## 2021-12-09 DIAGNOSIS — R29898 Other symptoms and signs involving the musculoskeletal system: Secondary | ICD-10-CM | POA: Diagnosis not present

## 2021-12-09 DIAGNOSIS — M25512 Pain in left shoulder: Secondary | ICD-10-CM

## 2021-12-09 NOTE — Therapy (Signed)
[place], [place], [address] Phone: [phone]   Fax:  [phone]  Occupational Therapy Treatment  Patient Details  Name: Virginia [name] MRN: [id number] Date of Birth: 1946-09-28 Referring Provider (OT): Dr. Larena Glassman   Encounter Date: 12/09/2021   OT End of Session - 12/09/21 1027     Visit Number 17    Number of Visits 23    Date for OT Re-Evaluation 12/31/21    Authorization Type 1) Medicare A & B 2) BCBS    Authorization Time Period BCBS visit limit 91    Authorization - Visit Number 4    Authorization - Number of Visits 75    Progress Note Due on Visit 25    OT Start Time [phone]    OT Stop Time 1027    OT Time Calculation (min) 41 min    Activity Tolerance Patient tolerated treatment well    Behavior During Therapy [facility] for tasks assessed/performed             Past Medical History:  Diagnosis Date   Anxiety    Depression    HTN (hypertension)    resolved with weight loss s/p gastric bypass   Hypothyroidism    Since at least 2007   Osteoporosis    RLS (restless legs syndrome)    Sleep apnea    Stroke [facility])    2013/February 2017    Past Surgical History:  Procedure Laterality Date   BREAST BIOPSY Right    benign   CATARACT EXTRACTION  2018   CHOLECYSTECTOMY     COLONOSCOPY  2004   internal hemorrhoids, left-sided diverticulae, splenic flexure polyp (61m), path unavailable at this time   COLONOSCOPY  04/2011   RMR: Internal and external hemorrhoids, rectal tubular adenoma removed, left-sided diverticulosis   COLONOSCOPY N/A 06/01/2016   Procedure: COLONOSCOPY;  Surgeon: RDaneil Dolin MD;  Location: [facility] ENDO [facility];  Service: Endoscopy;  Laterality: N/A;  0830   ESOPHAGOGASTRODUODENOSCOPY  2004   small hiatal hernia   GASTRIC ROUX-EN-Y  2006   REVERSE SHOULDER ARTHROPLASTY Left 09/29/2021   Procedure: LEFT REVERSE SHOULDER ARTHROPLASTY;  Surgeon: CMordecai Rasmussen MD;  Location: [facility]  ORS;  Service: Orthopedics;  Laterality: Left;    There were no vitals filed for this visit.   Subjective Assessment - 12/09/21 0945     Subjective  S: There is a big knot there where we worked yesterday.    Currently in Pain? No/denies                [facility] - 12/09/21 0945       Assessment   Medical Diagnosis s/p left reverse TSA      Precautions   Precautions Shoulder    Type of Shoulder Precautions See protocol.                      OT Treatments/Exercises (OP) - 12/09/21 0947       Exercises   Exercises Shoulder      Shoulder Exercises: Supine   Protraction PROM;5 reps    Horizontal ABduction PROM;5 reps    External Rotation PROM;5 reps    Internal Rotation PROM;5 reps    Flexion PROM;5 reps    ABduction PROM;5 reps      Shoulder Exercises: Standing   Protraction AROM;10 reps    Horizontal ABduction AROM;10 reps    External Rotation AROM;10 reps  Internal Rotation AROM;10 reps    Flexion AROM;10 reps    ABduction AROM;10 reps      Shoulder Exercises: Stretch   Cross Chest Stretch 2 reps;10 seconds    External Rotation Stretch 2 reps;10 seconds    Wall Stretch - Flexion 2 reps;10 seconds    Wall Stretch - ABduction 2 reps;10 seconds    Other Shoulder Stretches doorway stretch, 2x10"      Modalities   Modalities Ultrasound      Ultrasound   Ultrasound Location anterior shoulder    Ultrasound Parameters 1.5 W/cm2    Ultrasound Goals Pain;Other (Comment)   decrease adhesions     Manual Therapy   Manual Therapy Myofascial release    Manual therapy comments Manual therapy completed prior to exercises.    Myofascial Release Myofascial release and manual stretching completed to left upper arm, upper trapezius, and scapularis region to decrease fascial restrictions and increase joint mobility in a pain free zone.                      OT Short Term Goals - 12/01/21 0848       OT SHORT TERM GOAL #1   Title Pt  will be provided with and educated on HEP to improve LUE mobility required for use during ADLs.    Time 4    Period Weeks    Status Achieved    Target Date 11/05/21      OT SHORT TERM GOAL #2   Title Pt will increase LUE P/ROM to improve ability to donn and doff shirts independently.    Time 4    Period Weeks    Status Achieved      OT SHORT TERM GOAL #3   Title Pt will increase LUE strength to 3/5 or greater to improve ability to reach items at waist to chest height during ADLs.    Time 4    Period Weeks    Status Partially Met               OT Long Term Goals - 12/01/21 0848       OT LONG TERM GOAL #1   Title Pt will decrease LUE pain to 3/10 or less to improve ability to sleep in the bed instead of the recliner.    Time 8    Period Weeks    Status Achieved      OT LONG TERM GOAL #2   Title Pt will increase LUE A/ROM to [facility] to improve ability to perform reaching tasks overhead and behind back during ADLs.    Time 8    Period Weeks    Status On-going      OT LONG TERM GOAL #3   Title Pt will decrease LUE fascial restrictions to min amounts or less to improve mobility required for functional reaching tasks.    Time 8    Period Weeks    Status On-going      OT LONG TERM GOAL #4   Title Pt will increase LUE strength to 4+/5 or greater to improve ability to perform housekeeping tasks requiring lifting or pulling/pushing.    Time 8    Period Weeks    Status On-going      OT LONG TERM GOAL #5   Title Pt will achieve highest level of functioning using LUE as non-dominant during ADL and leisure tasks.    Time 8    Period Weeks    Status On-going  Plan - 12/09/21 1027     Clinical Impression Statement A: Pt reports soreness at anterior shoulder, continued with myofascial release and [place] in this area. Continued with A/ROM and added shoulder stretches today. Pt able to achieve good ROM with stretches. Verbal cuing for form and technique.     Body Structure / Function / Physical Skills ADL;Endurance;Muscle spasms;UE functional use;Fascial restriction;Pain;ROM;IADL;Strength    Plan P: Follow up on soreness and complete US as needed, continue with shoulder stretches and add to HEP    OT Home Exercise Plan eval: table slides, elbow flexion/extension, forearm supination/pronation 12/1: AA/ROM supine; 1/11: A/ROM    Consulted and Agree with Plan of Care Patient             Patient will benefit from skilled therapeutic intervention in order to improve the following deficits and impairments:   Body Structure / Function / Physical Skills: ADL, Endurance, Muscle spasms, UE functional use, Fascial restriction, Pain, ROM, IADL, Strength       Visit Diagnosis: Stiffness of left shoulder, not elsewhere classified  Acute pain of left shoulder  Other symptoms and signs involving the musculoskeletal system    Problem List Patient Active Problem List   Diagnosis Date Noted   Glenohumeral arthritis, left 09/29/2021   Intolerance of continuous positive airway pressure (CPAP) ventilation 02/17/2021   Status post bariatric surgery 02/17/2021   Periodic limb movement disorder (PLMD) 02/17/2021   OSA (obstructive sleep apnea) 02/17/2021   RLS (restless legs syndrome) 01/22/2018   History of colonic polyps    Constipation 05/11/2016   Gait difficulty 01/04/2016   Aphasia 01/04/2016   Acute CVA (cerebrovascular accident) ([place]) 01/04/2016   CVA (cerebral infarction) 01/04/2016   Ataxia    OSA on CPAP 12/02/2013   CTS (carpal tunnel syndrome) 03/18/2013   Wrist fracture 02/04/2013   Distal radius fracture 12/26/2012   Hyperlipidemia 06/30/2012   Elevated blood pressure reading without diagnosis of hypertension 06/29/2012   Stroke ([place]) 06/28/2012   Hypothyroidism 06/28/2012   Anxiety 06/28/2012   Personal history of colonic polyps 04/26/2011   Family history of colonic polyps 04/26/2011   ARTHRITIS, LEFT KNEE 06/06/2010    DERANGEMENT OF POSTERIOR HORN OF MEDIAL MENISCUS 06/06/2010   ANEMIA, B12 DEFICIENCY 02/07/2010   TRIGGER FINGER, THUMB 02/07/2010   SHOULDER PAIN 10/14/2008   BURSITIS, SHOULDER 10/14/2008   TRIGGER FINGER 06/15/2008    Guadelupe Sabin, OTR/L  [phone] 12/09/2021, 10:29 AM  [place], [place], [address] Phone: [phone]   Fax:  [phone]  Name: Virginia [name] MRN: [id number] Date of Birth: 12/29/1945

## 2021-12-13 ENCOUNTER — Other Ambulatory Visit: Payer: Self-pay

## 2021-12-13 ENCOUNTER — Encounter (HOSPITAL_COMMUNITY): Payer: Self-pay | Admitting: Occupational Therapy

## 2021-12-13 ENCOUNTER — Ambulatory Visit (HOSPITAL_COMMUNITY): Payer: Medicare Other | Admitting: Occupational Therapy

## 2021-12-13 DIAGNOSIS — M25612 Stiffness of left shoulder, not elsewhere classified: Secondary | ICD-10-CM | POA: Diagnosis not present

## 2021-12-13 DIAGNOSIS — M25512 Pain in left shoulder: Secondary | ICD-10-CM | POA: Diagnosis not present

## 2021-12-13 DIAGNOSIS — R29898 Other symptoms and signs involving the musculoskeletal system: Secondary | ICD-10-CM

## 2021-12-13 NOTE — Patient Instructions (Signed)
°  1) Flexion Wall Stretch    Face wall, place affected handon wall in front of you. Slide hand up the wall  and lean body in towards the wall. Hold for 10 seconds. Repeat 3-5 times. 1-2 times/day.     2) Towel Stretch with Internal Rotation       Gently pull up (or to the side) your affected arm  behind your back with the assist of a towel. Hold 10 seconds, repeat 3-5 times. 1-2 times/day.             3) Corner Stretch    Stand at a corner of a wall, place your arms on the walls with elbows bent. Lean into the corner until a stretch is felt along the front of your chest and/or shoulders. Hold for 10 seconds. Repeat 3-5X, 1-2 times/day.    4) Posterior Capsule Stretch    Bring the involved arm across chest. Grasp elbow and pull toward chest until you feel a stretch in the back of the upper arm and shoulder. Hold 10 seconds. Repeat 3-5X. Complete 1-2 times/day.     5) External Rotation Stretch:     Place your affected hand on the wall with the elbow bent and gently turn your body the opposite direction until a stretch is felt. Hold 10 seconds, repeat 3-5X. Complete 1-2 times/day.    6) Abduction Stretch    Stand beside wall, place affected hand on wall bedside you. Slide hand up the wall  and lean body in towards the wall. Hold for 10 seconds. Repeat 3-5 times. 1-2 times/day.

## 2021-12-13 NOTE — Therapy (Signed)
[facility] [address] [place], [place], [address] Phone: [phone]   Fax:  [phone]  Occupational Therapy Treatment  Patient Details  Name: Virginia [name] MRN: [id number] Date of Birth: Apr 25, 1946 Referring Provider (OT): Dr. Larena Glassman   Encounter Date: 12/13/2021   OT End of Session - 12/13/21 0901     Visit Number 18    Number of Visits 23    Date for OT Re-Evaluation 12/31/21    Authorization Type 1) Medicare A & B 2) BCBS    Authorization Time Period BCBS visit limit 6    Authorization - Visit Number 5    Authorization - Number of Visits 75    Progress Note Due on Visit 25    OT Start Time [phone]    OT Stop Time 0900    OT Time Calculation (min) 41 min    Activity Tolerance Patient tolerated treatment well    Behavior During Therapy [facility] for tasks assessed/performed             Past Medical History:  Diagnosis Date   Anxiety    Depression    HTN (hypertension)    resolved with weight loss s/p gastric bypass   Hypothyroidism    Since at least 2007   Osteoporosis    RLS (restless legs syndrome)    Sleep apnea    Stroke [facility])    2013/February 2017    Past Surgical History:  Procedure Laterality Date   BREAST BIOPSY Right    benign   CATARACT EXTRACTION  2018   CHOLECYSTECTOMY     COLONOSCOPY  2004   internal hemorrhoids, left-sided diverticulae, splenic flexure polyp (17m), path unavailable at this time   COLONOSCOPY  04/2011   RMR: Internal and external hemorrhoids, rectal tubular adenoma removed, left-sided diverticulosis   COLONOSCOPY N/A 06/01/2016   Procedure: COLONOSCOPY;  Surgeon: RDaneil Dolin MD;  Location: [facility];  Service: Endoscopy;  Laterality: N/A;  0830   ESOPHAGOGASTRODUODENOSCOPY  2004   small hiatal hernia   GASTRIC ROUX-EN-Y  2006   REVERSE SHOULDER ARTHROPLASTY Left 09/29/2021   Procedure: LEFT REVERSE SHOULDER ARTHROPLASTY;  Surgeon: CMordecai Rasmussen MD;  Location: [facility]  ORS;  Service: Orthopedics;  Laterality: Left;    There were no vitals filed for this visit.   Subjective Assessment - 12/13/21 0820     Subjective  S: I've got that spot pinpointed and it's right over the bone.    Currently in Pain? No/denies   only with some movements               [facility] - [facility]       Assessment   Medical Diagnosis s/p left reverse TSA      Precautions   Precautions Shoulder    Type of Shoulder Precautions See protocol.                      OT Treatments/Exercises (OP) - 12/13/21 04270      Exercises   Exercises Shoulder      Shoulder Exercises: Supine   Protraction PROM;5 reps;AROM;10 reps    Horizontal ABduction PROM;5 reps;AROM;10 reps    External Rotation PROM;5 reps;AROM;10 reps    Internal Rotation PROM;5 reps;AROM;10 reps    Flexion PROM;5 reps;AROM;10 reps    ABduction PROM;5 reps;AROM;10 reps      Shoulder Exercises: Standing   Extension Theraband;10 reps    Theraband Level (  Shoulder Extension) Level 2 (Red)    Row Theraband;10 reps    Theraband Level (Shoulder Row) Level 2 (Red)    Retraction Theraband;10 reps    Theraband Level (Shoulder Retraction) Level 2 (Red)      Shoulder Exercises: ROM/Strengthening   Proximal Shoulder Strengthening, Supine 10X each, no rest breaks      Shoulder Exercises: Stretch   Cross Chest Stretch 2 reps;10 seconds    Internal Rotation Stretch 2 reps   horizontal towel 10" holds   External Rotation Stretch 2 reps;10 seconds    Wall Stretch - Flexion 2 reps;10 seconds    Wall Stretch - ABduction 2 reps;10 seconds    Other Shoulder Stretches doorway stretch, 2x10"      Functional Reaching Activities   High Level Pt placing 10 cones on middle shelf of overhead cabinet in flexion, removing in abduction.      Modalities   Modalities Ultrasound      Ultrasound   Ultrasound Location anterior shoulder    Ultrasound Parameters 1.5 W/cm2    Ultrasound Goals  Pain;Other (Comment)   decrease adhesions                   OT Education - 12/13/21 0841     Education Details shoulder stretches    Person(s) Educated Patient    Methods Explanation;Handout;Demonstration;Verbal cues    Comprehension Returned demonstration;Verbalized understanding              OT Short Term Goals - 12/01/21 0848       OT SHORT TERM GOAL #1   Title Pt will be provided with and educated on HEP to improve LUE mobility required for use during ADLs.    Time 4    Period Weeks    Status Achieved    Target Date 11/05/21      OT SHORT TERM GOAL #2   Title Pt will increase LUE P/ROM to improve ability to donn and doff shirts independently.    Time 4    Period Weeks    Status Achieved      OT SHORT TERM GOAL #3   Title Pt will increase LUE strength to 3/5 or greater to improve ability to reach items at waist to chest height during ADLs.    Time 4    Period Weeks    Status Partially Met               OT Long Term Goals - 12/01/21 0848       OT LONG TERM GOAL #1   Title Pt will decrease LUE pain to 3/10 or less to improve ability to sleep in the bed instead of the recliner.    Time 8    Period Weeks    Status Achieved      OT LONG TERM GOAL #2   Title Pt will increase LUE A/ROM to [facility] to improve ability to perform reaching tasks overhead and behind back during ADLs.    Time 8    Period Weeks    Status On-going      OT LONG TERM GOAL #3   Title Pt will decrease LUE fascial restrictions to min amounts or less to improve mobility required for functional reaching tasks.    Time 8    Period Weeks    Status On-going      OT LONG TERM GOAL #4   Title Pt will increase LUE strength to 4+/5 or greater to improve ability to perform  housekeeping tasks requiring lifting or pulling/pushing.    Time 8    Period Weeks    Status On-going      OT LONG TERM GOAL #5   Title Pt will achieve highest level of functioning using LUE as non-dominant  during ADL and leisure tasks.    Time 8    Period Weeks    Status On-going                   Plan - 12/13/21 9622     Clinical Impression Statement A: Pt reports decreased soreness in her shoulder today. Min fascial restrictions noted, pt continues to have one spot of pain at the [facility] joint, [place] completed to address. Continued with passive stretching and A/ROM in supine, pt noted to have improved control on the descent today. Continued with shoulder stretches and added to HEP. Resumed scapular theraband and added functional reaching task. Verbal cuing for form and technique.    Body Structure / Function / Physical Skills ADL;Endurance;Muscle spasms;UE functional use;Fascial restriction;Pain;ROM;IADL;Strength    Plan P: Follow up on HEP, complete A/ROM in standing, functional reaching with squigz    OT Home Exercise Plan eval: table slides, elbow flexion/extension, forearm supination/pronation 12/1: AA/ROM supine; 1/11: A/ROM; 1/17: shoulder stretches    Consulted and Agree with Plan of Care Patient             Patient will benefit from skilled therapeutic intervention in order to improve the following deficits and impairments:   Body Structure / Function / Physical Skills: ADL, Endurance, Muscle spasms, UE functional use, Fascial restriction, Pain, ROM, IADL, Strength       Visit Diagnosis: Stiffness of left shoulder, not elsewhere classified  Acute pain of left shoulder  Other symptoms and signs involving the musculoskeletal system    Problem List Patient Active Problem List   Diagnosis Date Noted   Glenohumeral arthritis, left 09/29/2021   Intolerance of continuous positive airway pressure (CPAP) ventilation 02/17/2021   Status post bariatric surgery 02/17/2021   Periodic limb movement disorder (PLMD) 02/17/2021   OSA (obstructive sleep apnea) 02/17/2021   RLS (restless legs syndrome) 01/22/2018   History of colonic polyps    Constipation 05/11/2016   Gait  difficulty 01/04/2016   Aphasia 01/04/2016   Acute CVA (cerebrovascular accident) ([place]) 01/04/2016   CVA (cerebral infarction) 01/04/2016   Ataxia    OSA on CPAP 12/02/2013   CTS (carpal tunnel syndrome) 03/18/2013   Wrist fracture 02/04/2013   Distal radius fracture 12/26/2012   Hyperlipidemia 06/30/2012   Elevated blood pressure reading without diagnosis of hypertension 06/29/2012   Stroke ([place]) 06/28/2012   Hypothyroidism 06/28/2012   Anxiety 06/28/2012   Personal history of colonic polyps 04/26/2011   Family history of colonic polyps 04/26/2011   ARTHRITIS, LEFT KNEE 06/06/2010   DERANGEMENT OF POSTERIOR HORN OF MEDIAL MENISCUS 06/06/2010   ANEMIA, B12 DEFICIENCY 02/07/2010   TRIGGER FINGER, THUMB 02/07/2010   SHOULDER PAIN 10/14/2008   BURSITIS, SHOULDER 10/14/2008   TRIGGER FINGER 06/15/2008    Guadelupe Sabin, OTR/L  [phone] 12/13/2021, 9:02 AM  [facility] [place], [place], [address] Phone: [phone]   Fax:  [phone]  Name: Virginia [name] MRN: [id number] Date of Birth: 05/25/1946

## 2021-12-15 ENCOUNTER — Encounter (HOSPITAL_COMMUNITY): Payer: Medicare Other | Admitting: Occupational Therapy

## 2021-12-20 ENCOUNTER — Other Ambulatory Visit: Payer: Self-pay

## 2021-12-20 ENCOUNTER — Ambulatory Visit (HOSPITAL_COMMUNITY): Payer: Medicare Other | Admitting: Occupational Therapy

## 2021-12-20 ENCOUNTER — Ambulatory Visit (INDEPENDENT_AMBULATORY_CARE_PROVIDER_SITE_OTHER): Payer: Medicare Other | Admitting: Gastroenterology

## 2021-12-20 ENCOUNTER — Encounter: Payer: Self-pay | Admitting: Gastroenterology

## 2021-12-20 ENCOUNTER — Encounter (HOSPITAL_COMMUNITY): Payer: Self-pay | Admitting: Occupational Therapy

## 2021-12-20 ENCOUNTER — Telehealth: Payer: Self-pay | Admitting: *Deleted

## 2021-12-20 VITALS — BP 130/73 | HR 75 | Temp 96.2°F | Ht 63.0 in | Wt 190.6 lb

## 2021-12-20 DIAGNOSIS — M25612 Stiffness of left shoulder, not elsewhere classified: Secondary | ICD-10-CM

## 2021-12-20 DIAGNOSIS — Z86010 Personal history of colonic polyps: Secondary | ICD-10-CM | POA: Diagnosis not present

## 2021-12-20 DIAGNOSIS — R29898 Other symptoms and signs involving the musculoskeletal system: Secondary | ICD-10-CM | POA: Diagnosis not present

## 2021-12-20 DIAGNOSIS — M25512 Pain in left shoulder: Secondary | ICD-10-CM | POA: Diagnosis not present

## 2021-12-20 NOTE — Progress Notes (Addendum)
Primary Care Physician:  Redmond School, MD  Primary Gastroenterologist:  Garfield Cornea, MD   Chief Complaint  Patient presents with   Colonoscopy    Due for tcs. Constipation doing better    HPI:  Virginia [name] is a 76 y.o. female here to schedule surveillance colonoscopy.  She was due in 2022 for personal history of adenomatous colon polyps. Her last colonoscopy was in 2017.   Overall doing well. Takes Sennakot 2 pills daily. BM 1-2 times per day instead of once per week. No melena. No brbpr. No abdominal pain. Drinking more water. No ugi symptoms.   Last colonoscopy in 2017. - Diverticulosis in the entire examined colon. - Redundant colon. - The examination was otherwise normal on direct and retroflexion views. - No specimens collected.  -Next colonoscopy in 5 years.  Current Outpatient Medications  Medication Sig Dispense Refill   ALPRAZolam (XANAX) 0.5 MG tablet Take 0.5 mg by mouth at bedtime. Pt can take up to 3 tabs daily     amLODipine (NORVASC) 2.5 MG tablet Take 2.5 mg by mouth daily.     Ascorbic Acid (VITAMIN C) 1000 MG tablet Take 1,000 mg by mouth daily.     aspirin EC 81 MG tablet Take 81 mg by mouth daily.     atorvastatin (LIPITOR) 10 MG tablet Take 1 tablet (10 mg total) by mouth daily. (Patient taking differently: Take 10 mg by mouth every evening.) 30 tablet 1   b complex vitamins tablet Take 1 tablet by mouth daily.     Cholecalciferol (VITAMIN D-3) 1000 UNITS CAPS Take 1,000 Units by mouth daily.     DULoxetine (CYMBALTA) 60 MG capsule Take 60 mg by mouth daily.     fluocinonide ointment (LIDEX) 7.51 % Apply 1 application topically 2 (two) times daily as needed (irritation).     losartan (COZAAR) 100 MG tablet TAKE 1 TABLET BY MOUTH EVERY DAY (Patient taking differently: Take 100 mg by mouth daily.) 30 tablet 2   Multiple Vitamins-Minerals (ICAPS AREDS 2 PO) Take 1 capsule by mouth in the morning and at bedtime.     Multiple Vitamins-Minerals  (MULTIVITAMIN WITH MINERALS) tablet Take 1 tablet by mouth daily.     rOPINIRole (REQUIP) 0.5 MG tablet Take 1 tablet (0.5 mg total) by mouth at bedtime. 90 tablet 1   senna-docusate (SENOKOT-S) 8.6-50 MG tablet Take 2 tablets by mouth daily.     SYNTHROID 150 MCG tablet Take 150 mcg by mouth daily before breakfast.     No current facility-administered medications for this visit.    Allergies as of 12/20/2021 - Review Complete 12/20/2021  Allergen Reaction Noted   Augmentin [amoxicillin-pot clavulanate] Anaphylaxis 09/27/2021   Amoxicillin-pot clavulanate Hives and Rash     Past Medical History:  Diagnosis Date   Anxiety    Depression    HTN (hypertension)    resolved with weight loss s/p gastric bypass   Hypothyroidism    Since at least 2007   Osteoporosis    RLS (restless legs syndrome)    Sleep apnea    Stroke [facility])    2013/February 2017    Past Surgical History:  Procedure Laterality Date   BREAST BIOPSY Right    benign   CATARACT EXTRACTION  2018   CHOLECYSTECTOMY     COLONOSCOPY  2004   internal hemorrhoids, left-sided diverticulae, splenic flexure polyp (46mm), path unavailable at this time   COLONOSCOPY  04/2011   RMR: Internal and external hemorrhoids, rectal tubular adenoma  removed, left-sided diverticulosis   COLONOSCOPY N/A 06/01/2016   Procedure: COLONOSCOPY;  Surgeon: Daneil Dolin, MD;  Location: [facility];  Service: Endoscopy;  Laterality: N/A;  0830   ESOPHAGOGASTRODUODENOSCOPY  2004   small hiatal hernia   GASTRIC ROUX-EN-Y  2006   REVERSE SHOULDER ARTHROPLASTY Left 09/29/2021   Procedure: LEFT REVERSE SHOULDER ARTHROPLASTY;  Surgeon: Mordecai Rasmussen, MD;  Location: [facility] ORS;  Service: Orthopedics;  Laterality: Left;    Family History  Problem Relation Age of Onset   Lung cancer Father    Stroke Father    Stroke Mother    Stroke Brother    Hypertension Brother    Stroke Maternal Grandmother    Stroke Maternal Grandfather    Cancer Other         family history    Coronary artery disease Other        family history    Arthritis Other        family history    Cancer Daughter     Social History   Socioeconomic History   Marital status: Divorced    Spouse name: Not on file   Number of children: 1   Years of education: college   Highest education level: Not on file  Occupational History   Occupation: retired     Fish farm manager: RETIRED  Tobacco Use   Smoking status: Former    Types: Cigarettes    Quit date: 11/28/1987    Years since quitting: 34.0   Smokeless tobacco: Never   Tobacco comments:    Quit x 25 plus years  Vaping Use   Vaping Use: Never used  Substance and Sexual Activity   Alcohol use: No    Alcohol/week: 0.0 standard drinks   Drug use: No   Sexual activity: Not on file  Other Topics Concern   Not on file  Social History Narrative   Caffeine 6 cups daily.  Retired/[employer],   2 yrs college,  Divorced, one daughter.   Social Determinants of Health   Financial Resource Strain: Not on file  Food Insecurity: Not on file  Transportation Needs: Not on file  Physical Activity: Not on file  Stress: Not on file  Social Connections: Not on file  Intimate Partner Violence: Not on file      ROS:  General: Negative for anorexia, weight loss, fever, chills, fatigue, weakness. Eyes: Negative for vision changes.  ENT: Negative for hoarseness, difficulty swallowing , nasal congestion. CV: Negative for chest pain, angina, palpitations, dyspnea on exertion, peripheral edema.  Respiratory: Negative for dyspnea at rest, dyspnea on exertion, cough, sputum, wheezing.  GI: See history of present illness. GU:  Negative for dysuria, hematuria, urinary incontinence, urinary frequency, nocturnal urination.  MS: Negative for joint pain, low back pain. Recovering from shoulder replacement three months ago. Doing well but range of motion has not fully returned.  Derm: Negative for rash or itching.  Neuro: Negative for  weakness, abnormal sensation, seizure, frequent headaches, memory loss, confusion.  Psych: Negative for anxiety, depression, suicidal ideation, hallucinations.  Endo: Negative for unusual weight change.  Heme: Negative for bruising or bleeding. Allergy: Negative for rash or hives.    Physical Examination:  BP 130/73    Pulse 75    Temp (!) 96.2 F (35.7 C) (Temporal)    Ht 5\' 3"  (1.6 m)    Wt 190 lb 9.6 oz (86.5 kg)    BMI 33.76 kg/m    General: Well-nourished, well-developed in no  acute distress.  Head: Normocephalic, atraumatic.   Eyes: Conjunctiva pink, no icterus. Mouth: masked. Neck: Supple without thyromegaly, masses, or lymphadenopathy.  Lungs: Clear to auscultation bilaterally.  Heart: Regular rate and rhythm, no murmurs rubs or gallops.  Abdomen: Bowel sounds are normal, nontender, nondistended, no hepatosplenomegaly or masses, no abdominal bruits or    hernia , no rebound or guarding.   Rectal: not performed Extremities: No lower extremity edema. No clubbing or deformities.  Neuro: Alert and oriented x 4 , grossly normal neurologically.  Skin: Warm and dry, no rash or jaundice.   Psych: Alert and cooperative, normal mood and affect.  Labs: Lab Results  Component Value Date   TSH 1.770 11/01/2021   Lab Results  Component Value Date   CREATININE 1.04 (H) 11/01/2021   BUN 16 11/01/2021   NA 142 11/01/2021   K 4.5 11/01/2021   CL 105 11/01/2021   CO2 22 11/01/2021   Lab Results  Component Value Date   ALT 15 11/01/2021   AST 19 11/01/2021   ALKPHOS 262 (H) 11/01/2021   BILITOT 0.2 11/01/2021   Lab Results  Component Value Date   WBC 6.7 09/27/2021   HGB 12.1 09/27/2021   HCT 38.5 09/27/2021   MCV 90.2 09/27/2021   PLT 304 09/27/2021     Imaging Studies: No results found.   Assessment:  H/o adenomatous colon polyps: Due for surveillance colonoscopy at this time. Currently with no GI symptoms. Stools regular on bowel regimen.     Plan: TCS with  RMR. ASA3. Propofol. I have discussed the risks, alternatives, benefits with regards to but not limited to the risk of reaction to medication, bleeding, infection, perforation and the patient is agreeable to proceed. Written consent to be obtained.

## 2021-12-20 NOTE — Therapy (Signed)
[place], [place], [address] Phone: [phone]   Fax:  [phone]  Occupational Therapy Treatment  Patient Details  Name: Virginia [name] MRN: [id number] Date of Birth: 11/29/1945 Referring Provider (OT): Dr. Larena Glassman   Encounter Date: 12/20/2021   OT End of Session - 12/20/21 0853     Visit Number 19    Number of Visits 23    Date for OT Re-Evaluation 12/31/21    Authorization Type 1) Medicare A & B 2) BCBS    Authorization Time Period BCBS visit limit 43    Authorization - Visit Number 6    Authorization - Number of Visits 75    Progress Note Due on Visit 25    OT Start Time [phone]    OT Stop Time 8343    OT Time Calculation (min) 43 min    Activity Tolerance Patient tolerated treatment well    Behavior During Therapy [facility] for tasks assessed/performed             Past Medical History:  Diagnosis Date   Anxiety    Depression    HTN (hypertension)    resolved with weight loss s/p gastric bypass   Hypothyroidism    Since at least 2007   Osteoporosis    RLS (restless legs syndrome)    Sleep apnea    Stroke [facility])    2013/February 2017    Past Surgical History:  Procedure Laterality Date   BREAST BIOPSY Right    benign   CATARACT EXTRACTION  2018   CHOLECYSTECTOMY     COLONOSCOPY  2004   internal hemorrhoids, left-sided diverticulae, splenic flexure polyp (6m), path unavailable at this time   COLONOSCOPY  04/2011   RMR: Internal and external hemorrhoids, rectal tubular adenoma removed, left-sided diverticulosis   COLONOSCOPY N/A 06/01/2016   Procedure: COLONOSCOPY;  Surgeon: RDaneil Dolin MD;  Location: [facility];  Service: Endoscopy;  Laterality: N/A;  0830   ESOPHAGOGASTRODUODENOSCOPY  2004   small hiatal hernia   GASTRIC ROUX-EN-Y  2006   REVERSE SHOULDER ARTHROPLASTY Left 09/29/2021   Procedure: LEFT REVERSE SHOULDER ARTHROPLASTY;  Surgeon: CMordecai Rasmussen MD;  Location: [facility]  ORS;  Service: Orthopedics;  Laterality: Left;    There were no vitals filed for this visit.   Subjective Assessment - 12/20/21 0815     Subjective  S: It's feeling pretty good.    Currently in Pain? No/denies                [facility] - 12/20/21 0815       Assessment   Medical Diagnosis s/p left reverse TSA      Precautions   Precautions Shoulder    Type of Shoulder Precautions See protocol.                      OT Treatments/Exercises (OP) - 12/20/21 0816       Exercises   Exercises Shoulder      Shoulder Exercises: Supine   Protraction PROM;5 reps;AROM;12 reps    Horizontal ABduction PROM;5 reps;AROM;12 reps    External Rotation PROM;5 reps;AROM;12 reps    Internal Rotation PROM;5 reps;AROM;12 reps    Flexion PROM;5 reps;AROM;12 reps    ABduction PROM;5 reps;AROM;12 reps      Shoulder Exercises: Standing   Protraction AROM;12 reps    Horizontal ABduction AROM;12 reps    External Rotation AROM;12 reps  Internal Rotation AROM;12 reps    Flexion AROM;12 reps    ABduction AROM;12 reps    Extension Theraband;10 reps    Theraband Level (Shoulder Extension) Level 2 (Red)    Row Theraband;10 reps    Theraband Level (Shoulder Row) Level 2 (Red)    Retraction Theraband;10 reps    Theraband Level (Shoulder Retraction) Level 2 (Red)      Shoulder Exercises: ROM/Strengthening   UBE (Upper Arm Bike) Level 1 3' forward 3' reverse, pace: 4.5    Proximal Shoulder Strengthening, Supine 10X each, no rest breaks    Proximal Shoulder Strengthening, Seated 10X each, no rest breaks      Functional Reaching Activities   High Level Pt placing squigz on doorway in flexion, removing in abduction                      OT Short Term Goals - 12/01/21 0848       OT SHORT TERM GOAL #1   Title Pt will be provided with and educated on HEP to improve LUE mobility required for use during ADLs.    Time 4    Period Weeks    Status Achieved     Target Date 11/05/21      OT SHORT TERM GOAL #2   Title Pt will increase LUE P/ROM to improve ability to donn and doff shirts independently.    Time 4    Period Weeks    Status Achieved      OT SHORT TERM GOAL #3   Title Pt will increase LUE strength to 3/5 or greater to improve ability to reach items at waist to chest height during ADLs.    Time 4    Period Weeks    Status Partially Met               OT Long Term Goals - 12/01/21 0848       OT LONG TERM GOAL #1   Title Pt will decrease LUE pain to 3/10 or less to improve ability to sleep in the bed instead of the recliner.    Time 8    Period Weeks    Status Achieved      OT LONG TERM GOAL #2   Title Pt will increase LUE A/ROM to [facility] to improve ability to perform reaching tasks overhead and behind back during ADLs.    Time 8    Period Weeks    Status On-going      OT LONG TERM GOAL #3   Title Pt will decrease LUE fascial restrictions to min amounts or less to improve mobility required for functional reaching tasks.    Time 8    Period Weeks    Status On-going      OT LONG TERM GOAL #4   Title Pt will increase LUE strength to 4+/5 or greater to improve ability to perform housekeeping tasks requiring lifting or pulling/pushing.    Time 8    Period Weeks    Status On-going      OT LONG TERM GOAL #5   Title Pt will achieve highest level of functioning using LUE as non-dominant during ADL and leisure tasks.    Time 8    Period Weeks    Status On-going                   Plan - 12/20/21 0825     Clinical Impression Statement A: Pt reports minimal soreness at  anterior shoulder over the weekend. Min fascial restrictions noted therefore just a couple minutes of manual therapy completed to warm up the area then proceeded straight to passive stretching. Increased A/ROM repetitions to 12, completed functional reaching task with squigz, added proximal shoulder strengthening in standing. Verbal cuing for form and  technique, mod fatigue at end of session.    Body Structure / Function / Physical Skills ADL;Endurance;Muscle spasms;UE functional use;Fascial restriction;Pain;ROM;IADL;Strength    Plan P: Complete A/ROM in standing, continue functional reaching, attempt proximal shoulder strengthening on door with washcloth    OT Home Exercise Plan eval: table slides, elbow flexion/extension, forearm supination/pronation 12/1: AA/ROM supine; 1/11: A/ROM; 1/17: shoulder stretches    Consulted and Agree with Plan of Care Patient             Patient will benefit from skilled therapeutic intervention in order to improve the following deficits and impairments:   Body Structure / Function / Physical Skills: ADL, Endurance, Muscle spasms, UE functional use, Fascial restriction, Pain, ROM, IADL, Strength       Visit Diagnosis: Stiffness of left shoulder, not elsewhere classified  Acute pain of left shoulder  Other symptoms and signs involving the musculoskeletal system    Problem List Patient Active Problem List   Diagnosis Date Noted   Glenohumeral arthritis, left 09/29/2021   Intolerance of continuous positive airway pressure (CPAP) ventilation 02/17/2021   Status post bariatric surgery 02/17/2021   Periodic limb movement disorder (PLMD) 02/17/2021   OSA (obstructive sleep apnea) 02/17/2021   RLS (restless legs syndrome) 01/22/2018   History of colonic polyps    Constipation 05/11/2016   Gait difficulty 01/04/2016   Aphasia 01/04/2016   Acute CVA (cerebrovascular accident) ([place]) 01/04/2016   CVA (cerebral infarction) 01/04/2016   Ataxia    OSA on CPAP 12/02/2013   CTS (carpal tunnel syndrome) 03/18/2013   Wrist fracture 02/04/2013   Distal radius fracture 12/26/2012   Hyperlipidemia 06/30/2012   Elevated blood pressure reading without diagnosis of hypertension 06/29/2012   Stroke ([place]) 06/28/2012   Hypothyroidism 06/28/2012   Anxiety 06/28/2012   Personal history of colonic polyps  04/26/2011   Family history of colonic polyps 04/26/2011   ARTHRITIS, LEFT KNEE 06/06/2010   DERANGEMENT OF POSTERIOR HORN OF MEDIAL MENISCUS 06/06/2010   ANEMIA, B12 DEFICIENCY 02/07/2010   TRIGGER FINGER, THUMB 02/07/2010   SHOULDER PAIN 10/14/2008   BURSITIS, SHOULDER 10/14/2008   TRIGGER FINGER 06/15/2008    Guadelupe Sabin, OTR/L  [phone] 12/20/2021, 8:57 AM  [place], [place], [address] Phone: [phone]   Fax:  [phone]  Name: Virginia [name] MRN: [id number] Date of Birth: 08/19/1946

## 2021-12-20 NOTE — Telephone Encounter (Signed)
Offered this Thursday/Friday for procedure but unable to do so. Offered Dr. Abbey Chatters but stated she wants Dr. Gala Romney. Advised we will call her once we have his future schedule.

## 2021-12-20 NOTE — Patient Instructions (Signed)
Colonoscopy to be scheduled. See separate instructions.  ?

## 2021-12-22 ENCOUNTER — Other Ambulatory Visit: Payer: Self-pay

## 2021-12-22 ENCOUNTER — Encounter (HOSPITAL_COMMUNITY): Payer: Self-pay | Admitting: Occupational Therapy

## 2021-12-22 ENCOUNTER — Ambulatory Visit (HOSPITAL_COMMUNITY): Payer: Medicare Other | Admitting: Occupational Therapy

## 2021-12-22 DIAGNOSIS — M25512 Pain in left shoulder: Secondary | ICD-10-CM | POA: Diagnosis not present

## 2021-12-22 DIAGNOSIS — M25612 Stiffness of left shoulder, not elsewhere classified: Secondary | ICD-10-CM

## 2021-12-22 DIAGNOSIS — R29898 Other symptoms and signs involving the musculoskeletal system: Secondary | ICD-10-CM

## 2021-12-22 NOTE — Therapy (Signed)
[place], [place], [address] Phone: [phone]   Fax:  [phone]  Occupational Therapy Treatment  Patient Details  Name: Virginia [name] MRN: [id number] Date of Birth: 1946/09/02 Referring Provider (OT): Dr. Larena Glassman   Encounter Date: 12/22/2021   OT End of Session - 12/22/21 0852     Visit Number 20    Number of Visits 23    Date for OT Re-Evaluation 12/31/21    Authorization Type 1) Medicare A & B 2) BCBS    Authorization Time Period BCBS visit limit 100    Authorization - Visit Number 7    Authorization - Number of Visits 75    Progress Note Due on Visit 25    OT Start Time [phone]    OT Stop Time 0856    OT Time Calculation (min) 39 min    Activity Tolerance Patient tolerated treatment well    Behavior During Therapy [facility] for tasks assessed/performed             Past Medical History:  Diagnosis Date   Anxiety    Depression    HTN (hypertension)    resolved with weight loss s/p gastric bypass   Hypothyroidism    Since at least 2007   Osteoporosis    RLS (restless legs syndrome)    Sleep apnea    Stroke [facility])    2013/February 2017    Past Surgical History:  Procedure Laterality Date   BREAST BIOPSY Right    benign   CATARACT EXTRACTION  2018   CHOLECYSTECTOMY     COLONOSCOPY  2004   internal hemorrhoids, left-sided diverticulae, splenic flexure polyp (47m), path unavailable at this time   COLONOSCOPY  04/2011   RMR: Internal and external hemorrhoids, rectal tubular adenoma removed, left-sided diverticulosis   COLONOSCOPY N/A 06/01/2016   Procedure: COLONOSCOPY;  Surgeon: RDaneil Dolin MD;  Location: [facility];  Service: Endoscopy;  Laterality: N/A;  0830   ESOPHAGOGASTRODUODENOSCOPY  2004   small hiatal hernia   GASTRIC ROUX-EN-Y  2006   REVERSE SHOULDER ARTHROPLASTY Left 09/29/2021   Procedure: LEFT REVERSE SHOULDER ARTHROPLASTY;  Surgeon: CMordecai Rasmussen MD;  Location: [facility]  ORS;  Service: Orthopedics;  Laterality: Left;    There were no vitals filed for this visit.   Subjective Assessment - 12/22/21 0811     Subjective  S: I'm good.    Currently in Pain? No/denies                [facility] - [facility]       Assessment   Medical Diagnosis s/p left reverse TSA      Precautions   Precautions Shoulder    Type of Shoulder Precautions See protocol.                      OT Treatments/Exercises (OP) - 12/22/21 0818       Exercises   Exercises Shoulder      Shoulder Exercises: Supine   Protraction PROM;5 reps    Horizontal ABduction PROM;5 reps    External Rotation PROM;5 reps    Internal Rotation PROM;5 reps    Flexion PROM;5 reps    ABduction PROM;5 reps      Shoulder Exercises: Standing   Protraction AROM;12 reps    Horizontal ABduction AROM;12 reps    External Rotation AROM;12 reps    Internal Rotation AROM;12 reps    Flexion  AROM;12 reps    ABduction AROM;12 reps      Shoulder Exercises: ROM/Strengthening   UBE (Upper Arm Bike) Level 2 3' forward 3' reverse, pace: 7.5    Over Head Lace 2' seated    Proximal Shoulder Strengthening, Seated 10X each, no rest breaks    Other ROM/Strengthening Exercises ball pass behind back for IR and behind head for er, 10X to the right    Other ROM/Strengthening Exercises proximal shoulder strengthening on door with washcloth, 45"      Shoulder Exercises: Stretch   Cross Chest Stretch 2 reps;20 seconds    Internal Rotation Stretch 2 reps   20" holds horizontal towel   External Rotation Stretch 2 reps;20 seconds    Wall Stretch - Flexion 2 reps;20 seconds    Wall Stretch - ABduction 2 reps;20 seconds      Functional Reaching Activities   High Level Pt removing items from middle shelf of kitchen cabinet, putting items from the bottom up to the middle, then replacing removed items onto bottom shelf. All in flexion                      OT Short Term Goals -  12/01/21 0848       OT SHORT TERM GOAL #1   Title Pt will be provided with and educated on HEP to improve LUE mobility required for use during ADLs.    Time 4    Period Weeks    Status Achieved    Target Date 11/05/21      OT SHORT TERM GOAL #2   Title Pt will increase LUE P/ROM to improve ability to donn and doff shirts independently.    Time 4    Period Weeks    Status Achieved      OT SHORT TERM GOAL #3   Title Pt will increase LUE strength to 3/5 or greater to improve ability to reach items at waist to chest height during ADLs.    Time 4    Period Weeks    Status Partially Met               OT Long Term Goals - 12/01/21 0848       OT LONG TERM GOAL #1   Title Pt will decrease LUE pain to 3/10 or less to improve ability to sleep in the bed instead of the recliner.    Time 8    Period Weeks    Status Achieved      OT LONG TERM GOAL #2   Title Pt will increase LUE A/ROM to [facility] to improve ability to perform reaching tasks overhead and behind back during ADLs.    Time 8    Period Weeks    Status On-going      OT LONG TERM GOAL #3   Title Pt will decrease LUE fascial restrictions to min amounts or less to improve mobility required for functional reaching tasks.    Time 8    Period Weeks    Status On-going      OT LONG TERM GOAL #4   Title Pt will increase LUE strength to 4+/5 or greater to improve ability to perform housekeeping tasks requiring lifting or pulling/pushing.    Time 8    Period Weeks    Status On-going      OT LONG TERM GOAL #5   Title Pt will achieve highest level of functioning using LUE as non-dominant during ADL and leisure  tasks.    Time 8    Period Weeks    Status On-going                   Plan - 12/22/21 6803     Clinical Impression Statement A: Pt reports minimal soreness over the past few days. Continued with passive stretching, completed A/ROM in standing then transitioned to shoulder stretches. Pt improving in ROM  during stretching. Added overhead lacing and completed extended functional reaching task in ADL kitchen. Mod fatigue with tasks, verbal cuing for form and technique. Rest breaks provided as needed.    Body Structure / Function / Physical Skills ADL;Endurance;Muscle spasms;UE functional use;Fascial restriction;Pain;ROM;IADL;Strength    Plan P: Add ball on wall, attempt therapy ball strengthening    OT Home Exercise Plan eval: table slides, elbow flexion/extension, forearm supination/pronation 12/1: AA/ROM supine; 1/11: A/ROM; 1/17: shoulder stretches    Consulted and Agree with Plan of Care Patient             Patient will benefit from skilled therapeutic intervention in order to improve the following deficits and impairments:   Body Structure / Function / Physical Skills: ADL, Endurance, Muscle spasms, UE functional use, Fascial restriction, Pain, ROM, IADL, Strength       Visit Diagnosis: Stiffness of left shoulder, not elsewhere classified  Acute pain of left shoulder  Other symptoms and signs involving the musculoskeletal system    Problem List Patient Active Problem List   Diagnosis Date Noted   Glenohumeral arthritis, left 09/29/2021   Intolerance of continuous positive airway pressure (CPAP) ventilation 02/17/2021   Status post bariatric surgery 02/17/2021   Periodic limb movement disorder (PLMD) 02/17/2021   OSA (obstructive sleep apnea) 02/17/2021   RLS (restless legs syndrome) 01/22/2018   History of colonic polyps    Constipation 05/11/2016   Gait difficulty 01/04/2016   Aphasia 01/04/2016   Acute CVA (cerebrovascular accident) ([place]) 01/04/2016   CVA (cerebral infarction) 01/04/2016   Ataxia    OSA on CPAP 12/02/2013   CTS (carpal tunnel syndrome) 03/18/2013   Wrist fracture 02/04/2013   Distal radius fracture 12/26/2012   Hyperlipidemia 06/30/2012   Elevated blood pressure reading without diagnosis of hypertension 06/29/2012   Stroke ([place]) 06/28/2012    Hypothyroidism 06/28/2012   Anxiety 06/28/2012   Personal history of colonic polyps 04/26/2011   Family history of colonic polyps 04/26/2011   ARTHRITIS, LEFT KNEE 06/06/2010   DERANGEMENT OF POSTERIOR HORN OF MEDIAL MENISCUS 06/06/2010   ANEMIA, B12 DEFICIENCY 02/07/2010   TRIGGER FINGER, THUMB 02/07/2010   SHOULDER PAIN 10/14/2008   BURSITIS, SHOULDER 10/14/2008   TRIGGER FINGER 06/15/2008    Guadelupe Sabin, OTR/L  [phone] 12/22/2021, 8:56 AM  [place], [place], [address] Phone: [phone]   Fax:  [phone]  Name: Virginia [name] MRN: [id number] Date of Birth: 02-Dec-1945

## 2021-12-27 ENCOUNTER — Ambulatory Visit (INDEPENDENT_AMBULATORY_CARE_PROVIDER_SITE_OTHER): Payer: Medicare Other | Admitting: Orthopedic Surgery

## 2021-12-27 ENCOUNTER — Encounter (HOSPITAL_COMMUNITY): Payer: Self-pay | Admitting: Occupational Therapy

## 2021-12-27 ENCOUNTER — Encounter: Payer: Self-pay | Admitting: Orthopedic Surgery

## 2021-12-27 ENCOUNTER — Other Ambulatory Visit: Payer: Self-pay

## 2021-12-27 ENCOUNTER — Ambulatory Visit: Payer: Medicare Other

## 2021-12-27 ENCOUNTER — Ambulatory Visit (HOSPITAL_COMMUNITY): Payer: Medicare Other | Admitting: Occupational Therapy

## 2021-12-27 VITALS — Ht 63.0 in | Wt 190.0 lb

## 2021-12-27 DIAGNOSIS — M25612 Stiffness of left shoulder, not elsewhere classified: Secondary | ICD-10-CM

## 2021-12-27 DIAGNOSIS — R29898 Other symptoms and signs involving the musculoskeletal system: Secondary | ICD-10-CM | POA: Diagnosis not present

## 2021-12-27 DIAGNOSIS — Z96612 Presence of left artificial shoulder joint: Secondary | ICD-10-CM

## 2021-12-27 DIAGNOSIS — M25512 Pain in left shoulder: Secondary | ICD-10-CM

## 2021-12-27 NOTE — Progress Notes (Signed)
Orthopaedic Postop Note  Assessment: Virginia [name] is a 76 y.o. female s/p Left Reverse Shoulder Arthroplasty  DOS: 09/29/2021  Plan: Radiographs remained stable.  Her incision looks very good.  She is pleased with her progress to date.  She has 1 area of tenderness, directly in line with the incision, which could represent some scar tissue, or at the site of the subscapularis repair.  Either way, her function is good, and she is pleased overall.  Encouraged her to continue working with therapy, including home exercises.  If it anytime, she would like to return to physical therapy, I have asked her to contact the clinic.  Otherwise, we will see her back in 3 months for repeat evaluation.   Follow-up: Return in about 3 months (around 03/26/2022).  XR at next visit: Left shoulder  Subjective:  Chief Complaint  Patient presents with   Routine Post Op    DOS 09/29/21    History of Present Illness: Virginia [name] is a 76 y.o. female who presents following the above stated procedure.  Surgery was approximately 3 months ago.  She is doing well overall.  She continues to improve her range of motion and overall function.  No issues with her surgical incision.  She has occasional pain in the anterior aspect of her shoulder.  She states that this is the area that restricts her activities at times.  Otherwise, she is not having any issues.  Review of Systems: No fevers or chills No numbness or tingling No Chest Pain No shortness of breath    Objective: Ht 5\' 3"  (1.6 m)    Wt 190 lb (86.2 kg)    BMI 33.66 kg/m   Physical Exam:  Alert and oriented.  No acute distress.  Surgical incision is healing well.  There is no surrounding erythema or drainage.  She has intact sensation throughout the left arm.  Active forward elevation to 160 degrees.  Internal rotation to T12.  External rotation at her side to 40 degrees.  She is able to get her hand to her head.  Fingers are warm and  well-perfused.  IMAGING: I personally ordered and reviewed the following images:  XR of the Left shoulder were obtained in clinic today and demonstrates a reverse shoulder arthroplasty with implants in good position.  No evidence of acute injury or subsidence of implants.  No acute injuries.  Prostheses are in unchanged position compared to prior x-rays.  Impression: Left shoulder arthroplasty in stable position   Mordecai Rasmussen, MD 12/27/2021 9:56 AM

## 2021-12-27 NOTE — Therapy (Signed)
[place] [name], [place], [address] Phone: [phone]   Fax:  [phone]  Occupational Therapy Treatment  Patient Details  Name: Virginia [name] MRN: [id number] Date of Birth: 12-05-1945 Referring Provider (OT): Dr. Larena Glassman   Encounter Date: 12/27/2021   OT End of Session - 12/27/21 1133     Visit Number 21    Number of Visits 23    Date for OT Re-Evaluation 12/31/21    Authorization Type 1) Medicare A & B 2) BCBS    Authorization Time Period BCBS visit limit 14    Authorization - Visit Number 8    Authorization - Number of Visits 75    Progress Note Due on Visit 25    OT Start Time [phone]    OT Stop Time 1025    OT Time Calculation (min) 43 min    Activity Tolerance Patient tolerated treatment well    Behavior During Therapy [facility] for tasks assessed/performed             Past Medical History:  Diagnosis Date   Anxiety    Depression    HTN (hypertension)    resolved with weight loss s/p gastric bypass   Hypothyroidism    Since at least 2007   Osteoporosis    RLS (restless legs syndrome)    Sleep apnea    Stroke [facility])    2013/February 2017    Past Surgical History:  Procedure Laterality Date   BREAST BIOPSY Right    benign   CATARACT EXTRACTION  2018   CHOLECYSTECTOMY     COLONOSCOPY  2004   internal hemorrhoids, left-sided diverticulae, splenic flexure polyp (45m), path unavailable at this time   COLONOSCOPY  04/2011   RMR: Internal and external hemorrhoids, rectal tubular adenoma removed, left-sided diverticulosis   COLONOSCOPY N/A 06/01/2016   Procedure: COLONOSCOPY;  Surgeon: RDaneil Dolin MD;  Location: [facility] ENDO SUITE;  Service: Endoscopy;  Laterality: N/A;  0830   ESOPHAGOGASTRODUODENOSCOPY  2004   small hiatal hernia   GASTRIC ROUX-EN-Y  2006   REVERSE SHOULDER ARTHROPLASTY Left 09/29/2021   Procedure: LEFT REVERSE SHOULDER ARTHROPLASTY;  Surgeon: CMordecai Rasmussen MD;  Location: [facility]  ORS;  Service: Orthopedics;  Laterality: Left;    There were no vitals filed for this visit.   Subjective Assessment - 12/27/21 0943     Subjective  S:  The doctor is pleased with my progress .    Currently in Pain? No/denies                [facility]       Assessment   Medical Diagnosis s/p left reverse TSA      Precautions   Precautions Shoulder    Type of Shoulder Precautions See protocol.                      OT Treatments/Exercises (OP) - 12/27/21 0944       Exercises   Exercises Shoulder      Shoulder Exercises: Supine   Protraction PROM;5 reps    Horizontal ABduction PROM;5 reps    External Rotation PROM;5 reps    Internal Rotation PROM;5 reps    Flexion PROM;5 reps    ABduction PROM;5 reps      Shoulder Exercises: Standing   Protraction AROM;12 reps    Horizontal ABduction AROM;12 reps    External Rotation AROM;12 reps    Internal  Rotation AROM;12 reps    Flexion AROM;12 reps    ABduction AROM;12 reps    Extension Theraband;10 reps    Theraband Level (Shoulder Extension) Level 2 (Red)    Row Theraband;10 reps    Theraband Level (Shoulder Row) Level 2 (Red)    Retraction Theraband;10 reps    Theraband Level (Shoulder Retraction) Level 2 (Red)      Shoulder Exercises: Therapy Ball   Other Therapy Ball Exercises green therapy ball: chest press, flexion, 10X each      Shoulder Exercises: ROM/Strengthening   UBE (Upper Arm Bike) Level 2 3' forward 3' reverse, pace: 7.5    Over Head Lace 2' seated    X to V Arms 10X A/ROM    Proximal Shoulder Strengthening, Seated 10X each, no rest breaks    Ball on Wall 1' flexion green ball    Other ROM/Strengthening Exercises ball pass behind back for IR and behind head for er, 10X to the right      Shoulder Exercises: Stretch   Cross Chest Stretch 2 reps;20 seconds    Internal Rotation Stretch 2 reps   20" horizontal towel   External Rotation Stretch 2 reps;20 seconds     Wall Stretch - Flexion 2 reps;20 seconds    Wall Stretch - ABduction 2 reps;20 seconds                      OT Short Term Goals - 12/01/21 0848       OT SHORT TERM GOAL #1   Title Pt will be provided with and educated on HEP to improve LUE mobility required for use during ADLs.    Time 4    Period Weeks    Status Achieved    Target Date 11/05/21      OT SHORT TERM GOAL #2   Title Pt will increase LUE P/ROM to improve ability to donn and doff shirts independently.    Time 4    Period Weeks    Status Achieved      OT SHORT TERM GOAL #3   Title Pt will increase LUE strength to 3/5 or greater to improve ability to reach items at waist to chest height during ADLs.    Time 4    Period Weeks    Status Partially Met               OT Long Term Goals - 12/01/21 0848       OT LONG TERM GOAL #1   Title Pt will decrease LUE pain to 3/10 or less to improve ability to sleep in the bed instead of the recliner.    Time 8    Period Weeks    Status Achieved      OT LONG TERM GOAL #2   Title Pt will increase LUE A/ROM to [facility] to improve ability to perform reaching tasks overhead and behind back during ADLs.    Time 8    Period Weeks    Status On-going      OT LONG TERM GOAL #3   Title Pt will decrease LUE fascial restrictions to min amounts or less to improve mobility required for functional reaching tasks.    Time 8    Period Weeks    Status On-going      OT LONG TERM GOAL #4   Title Pt will increase LUE strength to 4+/5 or greater to improve ability to perform housekeeping tasks requiring lifting or pulling/pushing.  Time 8    Period Weeks    Status On-going      OT LONG TERM GOAL #5   Title Pt will achieve highest level of functioning using LUE as non-dominant during ADL and leisure tasks.    Time 8    Period Weeks    Status On-going                   Plan - 12/27/21 1002     Clinical Impression Statement A: Pt reports MD is pleased with  her progress. Continued with A/ROM and progressed towards strengthening today. Added therapy ball exercises and ball on wall, max difficulty with lifting ball over shoulder height. Continued with shoulder stretches, scapular theraband, and overhead lacing. Verbal cuing for form and technique, rest breaks provided as needed.    Body Structure / Function / Physical Skills ADL;Endurance;Muscle spasms;UE functional use;Fascial restriction;Pain;ROM;IADL;Strength    Plan P: Reassessment, update HEP, discharge pt    OT Home Exercise Plan eval: table slides, elbow flexion/extension, forearm supination/pronation 12/1: AA/ROM supine; 1/11: A/ROM; 1/17: shoulder stretches    Consulted and Agree with Plan of Care Patient             Patient will benefit from skilled therapeutic intervention in order to improve the following deficits and impairments:   Body Structure / Function / Physical Skills: ADL, Endurance, Muscle spasms, UE functional use, Fascial restriction, Pain, ROM, IADL, Strength       Visit Diagnosis: Stiffness of left shoulder, not elsewhere classified  Acute pain of left shoulder  Other symptoms and signs involving the musculoskeletal system    Problem List Patient Active Problem List   Diagnosis Date Noted   Glenohumeral arthritis, left 09/29/2021   Intolerance of continuous positive airway pressure (CPAP) ventilation 02/17/2021   Status post bariatric surgery 02/17/2021   Periodic limb movement disorder (PLMD) 02/17/2021   OSA (obstructive sleep apnea) 02/17/2021   RLS (restless legs syndrome) 01/22/2018   History of colonic polyps    Constipation 05/11/2016   Gait difficulty 01/04/2016   Aphasia 01/04/2016   Acute CVA (cerebrovascular accident) ([place]) 01/04/2016   CVA (cerebral infarction) 01/04/2016   Ataxia    OSA on CPAP 12/02/2013   CTS (carpal tunnel syndrome) 03/18/2013   Wrist fracture 02/04/2013   Distal radius fracture 12/26/2012   Hyperlipidemia 06/30/2012    Elevated blood pressure reading without diagnosis of hypertension 06/29/2012   Stroke ([place]) 06/28/2012   Hypothyroidism 06/28/2012   Anxiety 06/28/2012   Personal history of colonic polyps 04/26/2011   Family history of colonic polyps 04/26/2011   ARTHRITIS, LEFT KNEE 06/06/2010   DERANGEMENT OF POSTERIOR HORN OF MEDIAL MENISCUS 06/06/2010   ANEMIA, B12 DEFICIENCY 02/07/2010   TRIGGER FINGER, THUMB 02/07/2010   SHOULDER PAIN 10/14/2008   BURSITIS, SHOULDER 10/14/2008   TRIGGER FINGER 06/15/2008    Guadelupe Sabin, OTR/L  [phone] 12/27/2021, 11:35 AM  [place], [place], [address] Phone: [phone]   Fax:  [phone]  Name: Virginia [name] MRN: [id number] Date of Birth: [birth year]

## 2021-12-30 ENCOUNTER — Other Ambulatory Visit: Payer: Self-pay

## 2021-12-30 ENCOUNTER — Ambulatory Visit (HOSPITAL_COMMUNITY): Payer: Medicare Other | Attending: Orthopedic Surgery | Admitting: Occupational Therapy

## 2021-12-30 ENCOUNTER — Encounter (HOSPITAL_COMMUNITY): Payer: Self-pay | Admitting: Occupational Therapy

## 2021-12-30 DIAGNOSIS — M25512 Pain in left shoulder: Secondary | ICD-10-CM | POA: Insufficient documentation

## 2021-12-30 DIAGNOSIS — R29898 Other symptoms and signs involving the musculoskeletal system: Secondary | ICD-10-CM | POA: Insufficient documentation

## 2021-12-30 DIAGNOSIS — M25612 Stiffness of left shoulder, not elsewhere classified: Secondary | ICD-10-CM | POA: Diagnosis not present

## 2021-12-30 NOTE — Patient Instructions (Signed)

## 2021-12-30 NOTE — Therapy (Signed)
[employer] [place], [place], [address] Phone: [phone]   Fax:  [phone]  Occupational Therapy Reassessment, Treatment, Discharge Summary   Patient Details  Name: Virginia [name] MRN: [id number] Date of Birth: 1946/05/16 Referring Provider (OT): Dr. Larena Glassman   Encounter Date: 12/30/2021   OT End of Session - 12/30/21 0805     Visit Number 22    Number of Visits 23    Date for OT Re-Evaluation 12/31/21    Authorization Type 1) Medicare A & B 2) BCBS    Authorization Time Period BCBS visit limit 31    Authorization - Visit Number 9    Authorization - Number of Visits 75    Progress Note Due on Visit 25    OT Start Time 0730    OT Stop Time 0802    OT Time Calculation (min) 32 min    Activity Tolerance Patient tolerated treatment well    Behavior During Therapy [facility] for tasks assessed/performed             Past Medical History:  Diagnosis Date   Anxiety    Depression    HTN (hypertension)    resolved with weight loss s/p gastric bypass   Hypothyroidism    Since at least 2007   Osteoporosis    RLS (restless legs syndrome)    Sleep apnea    Stroke [facility])    2013/February 2017    Past Surgical History:  Procedure Laterality Date   BREAST BIOPSY Right    benign   CATARACT EXTRACTION  2018   CHOLECYSTECTOMY     COLONOSCOPY  2004   internal hemorrhoids, left-sided diverticulae, splenic flexure polyp (70m), path unavailable at this time   COLONOSCOPY  04/2011   RMR: Internal and external hemorrhoids, rectal tubular adenoma removed, left-sided diverticulosis   COLONOSCOPY N/A 06/01/2016   Procedure: COLONOSCOPY;  Surgeon: RDaneil Dolin MD;  Location: [facility] ENDO SUITE;  Service: Endoscopy;  Laterality: N/A;  0830   ESOPHAGOGASTRODUODENOSCOPY  2004   small hiatal hernia   GASTRIC ROUX-EN-Y  2006   REVERSE SHOULDER ARTHROPLASTY Left 09/29/2021   Procedure: LEFT REVERSE SHOULDER ARTHROPLASTY;  Surgeon:  CMordecai Rasmussen MD;  Location: [facility] ORS;  Service: Orthopedics;  Laterality: Left;    There were no vitals filed for this visit.   Subjective Assessment - 12/30/21 0729     Subjective  S: Driving is easier.    Currently in Pain? No/denies                [facility] - 12/30/21 0001       Assessment   Medical Diagnosis s/p left reverse TSA      Precautions   Precautions Shoulder    Type of Shoulder Precautions See protocol.      Observation/Other Assessments   Focus on Therapeutic Outcomes (FOTO)  59/100   29/100 previous     Palpation   Palpation comment trace fascial restrictions along left upper arm, trapezius, and scapular regions      AROM   Overall AROM Comments Assessed seated, er/IR adducted    AROM Assessment Site Shoulder    Right/Left Shoulder Left    Left Shoulder Flexion 136 Degrees   90 previous   Left Shoulder ABduction 116 Degrees   100 previous   Left Shoulder Internal Rotation 90 Degrees   same as previous   Left Shoulder External Rotation 50 Degrees   30 previous  PROM   Overall PROM Comments Assessed supine, er/IR adducted    PROM Assessment Site Shoulder    Right/Left Shoulder Left    Left Shoulder Flexion 144 Degrees   138 previous   Left Shoulder ABduction 147 Degrees   145 previous   Left Shoulder Internal Rotation 90 Degrees   same as previous   Left Shoulder External Rotation 47 Degrees   43 previous     Strength   Overall Strength Comments Assessed seated, er/IR adducted. Assessed via observation, no formal MMT testing    Strength Assessment Site Shoulder    Right/Left Shoulder Left    Left Shoulder Flexion 4-/5   3-/5 previous   Left Shoulder ABduction 4-/5   3-/5 previous   Left Shoulder Internal Rotation 4+/5   3/5 previous   Left Shoulder External Rotation 4/5   3-/5 previous                     OT Treatments/Exercises (OP) - 12/30/21 0758       Exercises   Exercises Shoulder      Shoulder Exercises:  Supine   Protraction PROM;5 reps    Horizontal ABduction PROM;5 reps    External Rotation PROM;5 reps    Internal Rotation PROM;5 reps    Flexion PROM;5 reps    ABduction PROM;5 reps      Shoulder Exercises: Standing   Protraction Strengthening;Theraband;10 reps    Theraband Level (Shoulder Protraction) Level 2 (Red)    Protraction Weight (lbs) 1    Horizontal ABduction Strengthening;Theraband;10 reps    Theraband Level (Shoulder Horizontal ABduction) Level 2 (Red)    Horizontal ABduction Weight (lbs) 1    External Rotation Strengthening;Theraband;10 reps    Theraband Level (Shoulder External Rotation) Level 2 (Red)    External Rotation Weight (lbs) 1    Internal Rotation Strengthening;Theraband;10 reps    Theraband Level (Shoulder Internal Rotation) Level 2 (Red)    Internal Rotation Weight (lbs) 1    Flexion Strengthening;Theraband;10 reps    Theraband Level (Shoulder Flexion) Level 2 (Red)    Shoulder Flexion Weight (lbs) 1    ABduction Strengthening;Theraband;10 reps    Theraband Level (Shoulder ABduction) Level 2 (Red)    Shoulder ABduction Weight (lbs) 1                    OT Education - 12/30/21 0746     Education Details red theraband strengthening    Person(s) Educated Patient    Methods Explanation;Handout;Demonstration;Verbal cues    Comprehension Returned demonstration;Verbalized understanding              OT Short Term Goals - 12/30/21 0800       OT SHORT TERM GOAL #1   Title Pt will be provided with and educated on HEP to improve LUE mobility required for use during ADLs.    Time 4    Period Weeks    Status Achieved    Target Date 11/05/21      OT SHORT TERM GOAL #2   Title Pt will increase LUE P/ROM to improve ability to donn and doff shirts independently.    Time 4    Period Weeks    Status Achieved      OT SHORT TERM GOAL #3   Title Pt will increase LUE strength to 3/5 or greater to improve ability to reach items at waist to chest  height during ADLs.    Time 4    Period  Weeks    Status Achieved               OT Long Term Goals - 12/30/21 0800       OT LONG TERM GOAL #1   Title Pt will decrease LUE pain to 3/10 or less to improve ability to sleep in the bed instead of the recliner.    Time 8    Period Weeks    Status Achieved      OT LONG TERM GOAL #2   Title Pt will increase LUE A/ROM to [facility] to improve ability to perform reaching tasks overhead and behind back during ADLs.    Time 8    Period Weeks    Status Achieved      OT LONG TERM GOAL #3   Title Pt will decrease LUE fascial restrictions to min amounts or less to improve mobility required for functional reaching tasks.    Time 8    Period Weeks    Status Achieved      OT LONG TERM GOAL #4   Title Pt will increase LUE strength to 4+/5 or greater to improve ability to perform housekeeping tasks requiring lifting or pulling/pushing.    Time 8    Period Weeks    Status Partially Met      OT LONG TERM GOAL #5   Title Pt will achieve highest level of functioning using LUE as non-dominant during ADL and leisure tasks.    Time 8    Period Weeks    Status Partially Met                   Plan - 12/30/21 0800     Clinical Impression Statement A: Reassessment completed this session, pt has met all STGs and 3/5 LTGs with remaining LTGs partially met. Pt demonstrates improvement in ROM, strength, and functional use of her RUE. She notes driving and fastening her seatbelt have become easier, continues to have some difficulty with overhead reaching. Pt completed strengthening exercises today and HEP was updated. Verbal cuing for form and technique during tasks.    Body Structure / Function / Physical Skills ADL;Endurance;Muscle spasms;UE functional use;Fascial restriction;Pain;ROM;IADL;Strength    Plan P: Discharge pt    OT Home Exercise Plan eval: table slides, elbow flexion/extension, forearm supination/pronation 12/1: AA/ROM supine; 1/11:  A/ROM; 1/17: shoulder stretches; 2/3: red theraband strengthening    Consulted and Agree with Plan of Care Patient             Patient will benefit from skilled therapeutic intervention in order to improve the following deficits and impairments:   Body Structure / Function / Physical Skills: ADL, Endurance, Muscle spasms, UE functional use, Fascial restriction, Pain, ROM, IADL, Strength       Visit Diagnosis: Stiffness of left shoulder, not elsewhere classified  Acute pain of left shoulder  Other symptoms and signs involving the musculoskeletal system    Problem List Patient Active Problem List   Diagnosis Date Noted   Glenohumeral arthritis, left 09/29/2021   Intolerance of continuous positive airway pressure (CPAP) ventilation 02/17/2021   Status post bariatric surgery 02/17/2021   Periodic limb movement disorder (PLMD) 02/17/2021   OSA (obstructive sleep apnea) 02/17/2021   RLS (restless legs syndrome) 01/22/2018   History of colonic polyps    Constipation 05/11/2016   Gait difficulty 01/04/2016   Aphasia 01/04/2016   Acute CVA (cerebrovascular accident) ([place]) 01/04/2016   CVA (cerebral infarction) 01/04/2016   Ataxia  OSA on CPAP 12/02/2013   CTS (carpal tunnel syndrome) 03/18/2013   Wrist fracture 02/04/2013   Distal radius fracture 12/26/2012   Hyperlipidemia 06/30/2012   Elevated blood pressure reading without diagnosis of hypertension 06/29/2012   Stroke ([place]) 06/28/2012   Hypothyroidism 06/28/2012   Anxiety 06/28/2012   Personal history of colonic polyps 04/26/2011   Family history of colonic polyps 04/26/2011   ARTHRITIS, LEFT KNEE 06/06/2010   DERANGEMENT OF POSTERIOR HORN OF MEDIAL MENISCUS 06/06/2010   ANEMIA, B12 DEFICIENCY 02/07/2010   TRIGGER FINGER, THUMB 02/07/2010   SHOULDER PAIN 10/14/2008   BURSITIS, SHOULDER 10/14/2008   TRIGGER FINGER 06/15/2008    Guadelupe Sabin, OTR/L  [phone] 12/30/2021, 8:05 AM  [place], [place], [address] Phone: [phone]   Fax:  [phone]  Name: Virginia [name] MRN: [id number] Date of Birth: 1946-05-09   OCCUPATIONAL THERAPY DISCHARGE SUMMARY  Visits from Start of Care: 22  Current functional level related to goals / functional outcomes: See above. Pt has met all STGs, 3/5 LTGs with remaining goals partially met. Pt demonstrates ROM and strength WFL, improved functional use of LUE.   Remaining deficits: Difficulty with overhead reaching, decreased strength   Education / Equipment: HEP for stretches and strengthening   Patient agrees to discharge. Patient goals were met. Patient is being discharged due to meeting the stated rehab goals.Marland Kitchen

## 2022-01-01 DIAGNOSIS — Z20822 Contact with and (suspected) exposure to covid-19: Secondary | ICD-10-CM | POA: Diagnosis not present

## 2022-01-18 ENCOUNTER — Encounter (INDEPENDENT_AMBULATORY_CARE_PROVIDER_SITE_OTHER): Payer: Medicare Other | Admitting: Ophthalmology

## 2022-01-24 ENCOUNTER — Encounter (INDEPENDENT_AMBULATORY_CARE_PROVIDER_SITE_OTHER): Payer: Medicare Other | Admitting: Ophthalmology

## 2022-01-24 ENCOUNTER — Telehealth: Payer: Self-pay

## 2022-01-24 NOTE — Telephone Encounter (Signed)
Called pt, offered to schedule TCS w/Propofol ASA 3 w/Dr. Gala Romney. She would like to wait until April. Advised her we will call back when April schedule is available.

## 2022-01-30 ENCOUNTER — Other Ambulatory Visit (HOSPITAL_COMMUNITY): Payer: Self-pay | Admitting: Internal Medicine

## 2022-01-30 DIAGNOSIS — E063 Autoimmune thyroiditis: Secondary | ICD-10-CM | POA: Diagnosis not present

## 2022-01-30 DIAGNOSIS — I10 Essential (primary) hypertension: Secondary | ICD-10-CM | POA: Diagnosis not present

## 2022-01-30 DIAGNOSIS — Z6834 Body mass index (BMI) 34.0-34.9, adult: Secondary | ICD-10-CM | POA: Diagnosis not present

## 2022-01-30 DIAGNOSIS — E2839 Other primary ovarian failure: Secondary | ICD-10-CM

## 2022-01-30 DIAGNOSIS — F419 Anxiety disorder, unspecified: Secondary | ICD-10-CM | POA: Diagnosis not present

## 2022-02-02 ENCOUNTER — Other Ambulatory Visit: Payer: Self-pay

## 2022-02-02 MED ORDER — CLENPIQ 10-3.5-12 MG-GM -GM/160ML PO SOLN: 1.0000 | Freq: Once | ORAL | 0 refills | Status: AC

## 2022-02-02 NOTE — Telephone Encounter (Signed)
Pre-op appt 03/21/22. Appt letter mailed with procedure instructions. ?

## 2022-02-02 NOTE — Telephone Encounter (Signed)
Called pt, TCS scheduled for 03/23/22 at 7:30am. Requested low volume prep. Informed pt she may have to pay out of pocket for low volume prep. Rx for prep sent to pharmacy. Orders entered. ?

## 2022-02-06 ENCOUNTER — Encounter (INDEPENDENT_AMBULATORY_CARE_PROVIDER_SITE_OTHER): Payer: Medicare Other | Admitting: Ophthalmology

## 2022-02-06 ENCOUNTER — Other Ambulatory Visit: Payer: Self-pay

## 2022-02-06 DIAGNOSIS — H353132 Nonexudative age-related macular degeneration, bilateral, intermediate dry stage: Secondary | ICD-10-CM

## 2022-02-06 DIAGNOSIS — H43813 Vitreous degeneration, bilateral: Secondary | ICD-10-CM | POA: Diagnosis not present

## 2022-02-06 DIAGNOSIS — H35342 Macular cyst, hole, or pseudohole, left eye: Secondary | ICD-10-CM

## 2022-02-06 DIAGNOSIS — I10 Essential (primary) hypertension: Secondary | ICD-10-CM

## 2022-02-06 DIAGNOSIS — H35033 Hypertensive retinopathy, bilateral: Secondary | ICD-10-CM

## 2022-02-07 DIAGNOSIS — Z20822 Contact with and (suspected) exposure to covid-19: Secondary | ICD-10-CM | POA: Diagnosis not present

## 2022-02-16 ENCOUNTER — Ambulatory Visit (HOSPITAL_COMMUNITY)
Admission: RE | Admit: 2022-02-16 | Discharge: 2022-02-16 | Disposition: A | Discharge status: Home / Self Care | Payer: Medicare Other | Source: Ambulatory Visit | Attending: Internal Medicine | Admitting: Internal Medicine

## 2022-02-16 ENCOUNTER — Other Ambulatory Visit: Payer: Self-pay

## 2022-02-16 DIAGNOSIS — Z780 Asymptomatic menopausal state: Secondary | ICD-10-CM | POA: Diagnosis not present

## 2022-02-16 DIAGNOSIS — M85851 Other specified disorders of bone density and structure, right thigh: Secondary | ICD-10-CM | POA: Diagnosis not present

## 2022-02-16 DIAGNOSIS — E2839 Other primary ovarian failure: Secondary | ICD-10-CM | POA: Insufficient documentation

## 2022-03-01 ENCOUNTER — Telehealth: Payer: Self-pay | Admitting: Radiology

## 2022-03-01 ENCOUNTER — Ambulatory Visit: Payer: Medicare Other

## 2022-03-01 ENCOUNTER — Ambulatory Visit (INDEPENDENT_AMBULATORY_CARE_PROVIDER_SITE_OTHER): Payer: Medicare Other | Admitting: Orthopedic Surgery

## 2022-03-01 DIAGNOSIS — M25512 Pain in left shoulder: Secondary | ICD-10-CM

## 2022-03-01 NOTE — Patient Instructions (Signed)
Let the shoulder rest for 1-2 weeks. ? ?Use sling as needed ? ?Ibuprofen, up to 3 times daily ? ?Keep your previously scheduled appointment ? ?Call if you continue to have issues and need an earlier appointment.    ?

## 2022-03-01 NOTE — Telephone Encounter (Signed)
Patient called, said her shoulder has been hurting x 1 week, worsening.  She threw a small plastic bag of trash away, and left shld now hurting.  Taking ibu 800 mg bid.  Swelling and heat.  Worked in this pm for appt for eval with Dr Amedeo Kinsman.  ?

## 2022-03-01 NOTE — Progress Notes (Signed)
d 

## 2022-03-02 ENCOUNTER — Encounter: Payer: Self-pay | Admitting: Orthopedic Surgery

## 2022-03-02 NOTE — Progress Notes (Signed)
Orthopaedic Postop Note ? ?Assessment: ?Virginia [name] is a 76 y.o. female s/p Left Reverse Shoulder Arthroplasty ? ?DOS: 09/29/2021 ? ?Plan: ?Radiographs are stable.  No obvious acute injuries.  She has tenderness directly over the acromion.  She has limited range of motion as a result.  She notes acute onset within the last week.  Based on the location of her tenderness, I am concerned about an acromial stress fracture.  Once again, this is not obvious on the x-ray.  If she continues to struggle, we may have to obtain an MRI or CT scan for further evaluation.  I have advised her to take it easy.  Consider using her sling.  Medications as needed.  She has an appointment scheduled in approximately 1 month, and I recommend she maintain this appointment.  If she has any issues in the interim, she should return to clinic sooner. ? ? ?Follow-up: ?Return for Keep appointment; already scheduled . ? ?XR at next visit: Left shoulder ? ?Subjective: ? ?Chief Complaint  ?Patient presents with  ? Shoulder Pain  ?  LEFT shoulder ?S/p total replacement of left shoulder in Nov 2022 ?Patient states she tossed a small bag of trash in the trash can. She says the spot that is hurting has been hurting since the day she had surgery but seems to have gotten aggravated when she tossed the trash in the trash can  ? ? ?History of Present Illness: ?Virginia [name] is a 76 y.o. female who presents following the above stated procedure.  Surgery was approximately 5 months ago.  She had recovered very well.  Her shoulder was continued to improve and she was feeling better.  Recently, she states she tossed the small bag of trash, and started to have pain in the superior lateral aspect of the left shoulder.  This is progressively worsened over the last 1-2 weeks.  She now has limited range of motion as a result.  No fevers or chills.  No redness is appreciated.  She does appreciate some warmth about her shoulder. ? ? ?Review of Systems: ?No  fevers or chills ?No numbness or tingling ?No Chest Pain ?No shortness of breath  ? ? ?Objective: ?There were no vitals taken for this visit. ? ?Physical Exam: ? ?Alert and oriented.  No acute distress. ? ?Surgical incision has healed well.  No surrounding erythema or drainage.  Some warmth is appreciated about the shoulder.  No bruising.  She has tenderness to palpation over the acromion.  Abduction is limited to 60 degrees.  Limited forward flexion.  Fingers are warm and well-perfused. ? ?IMAGING: ?I personally ordered and reviewed the following images: ? ?X-ray of the left shoulder was obtained in clinic today.  No acute injuries are noted.  No callus formation, or fracture about the acromion.  Prostheses remain in an unchanged position.  Glenohumeral joint is reduced. ? ?Impression: Left shoulder x-ray following reverse shoulder arthroplasty, without acute injury. ? ?Mordecai Rasmussen, MD ?03/02/2022 ?10:48 PM ? ? ?

## 2022-03-20 NOTE — Patient Instructions (Addendum)
? ? Virginia [name] ? 03/20/2022  ?  ? '@PREFPERIOPPHARMACY'$ @ ? ? Your procedure is scheduled on .03/23/2022 ? Report to [facility] at 6:00 A.M. ? Call this number if you have problems the morning of surgery: ? [phone] ? ? Remember: ? Please follow the diet and prep instructions given to you by Dr Roseanne Kaufman office. ?  ? Take these medicines the morning of surgery with A SIP OF WATER : Xanax, Norvasc, Cymbalta and Synthroid ?  ? Do not wear jewelry, make-up or nail polish. ? Do not wear lotions, powders, or perfumes, or deodorant. ? Do not shave 48 hours prior to surgery.  Men may shave face and neck. ? Do not bring valuables to the hospital. ? [facility] is not responsible for any belongings or valuables. ? ?Contacts, dentures or bridgework may not be worn into surgery.  Leave your suitcase in the car.  After surgery it may be brought to your room. ? ?For patients admitted to the hospital, discharge time will be determined by your treatment team. ? ?Patients discharged the day of surgery will not be allowed to drive home.  ? ?Name and phone number of your driver:   Family ?Special instructions:  N/A ? ?Please read over the following fact sheets that you were given. ?Care and Recovery After Surgery ? ?Colonoscopy, Adult ?A colonoscopy is a procedure to look at the entire large intestine. This procedure is done using a long, thin, flexible tube that has a camera on the end. ?You may have a colonoscopy: ?As a part of normal colorectal screening. ?If you have certain symptoms, such as: ?A low number of red blood cells in your blood (anemia). ?Diarrhea that does not go away. ?Pain in your abdomen. ?Blood in your stool. ?A colonoscopy can help screen for and diagnose medical problems, including: ?An abnormal growth of cells or tissue (tumor). ?Abnormal growths within the lining of your intestine (polyps). ?Inflammation. ?Areas of bleeding. ?Tell your health care provider about: ?Any allergies you have. ?All medicines  you are taking, including vitamins, herbs, eye drops, creams, and over-the-counter medicines. ?Any problems you or family members have had with anesthetic medicines. ?Any bleeding problems you have. ?Any surgeries you have had. ?Any medical conditions you have. ?Any problems you have had with having bowel movements. ?Whether you are pregnant or may be pregnant. ?What are the risks? ?Generally, this is a safe procedure. However, problems may occur, including: ?Bleeding. ?Damage to your intestine. ?Allergic reactions to medicines given during the procedure. ?Infection. This is rare. ?What happens before the procedure? ?Eating and drinking restrictions ?Follow instructions from your health care provider about eating or drinking restrictions, which may include: ?A few days before the procedure: ?Follow a low-fiber diet. ?Avoid nuts, seeds, dried fruit, raw fruits, and vegetables. ?1-3 days before the procedure: ?Eat only gelatin dessert or ice pops. ?Drink only clear liquids, such as water, clear juice, clear broth or bouillon, black coffee or tea, or clear soft drinks or sports drinks. ?Avoid liquids that contain red or purple dye. ?The day of the procedure: ?Do not eat solid foods. You may continue to drink clear liquids until up to 2 hours before the procedure. ?Do not eat or drink anything starting 2 hours before the procedure, or within the time period that your health care provider recommends. ?Bowel prep ?If you were prescribed a bowel prep to take by mouth (orally) to clean out your colon: ?Take it as told by your health care provider. Starting  the day before your procedure, you will need to drink a large amount of liquid medicine. The liquid will cause you to have many bowel movements of loose stool until your stool becomes almost clear or light green. ?If your skin or the opening between the buttocks (anus) gets irritated from diarrhea, you may relieve the irritation using: ?Wipes with medicine in them, such  as adult wet wipes with aloe and vitamin E. ?A product to soothe skin, such as petroleum jelly. ?If you vomit while drinking the bowel prep: ?Take a break for up to 60 minutes. ?Begin the bowel prep again. ?Call your health care provider if you keep vomiting or you cannot take the bowel prep without vomiting. ?To clean out your colon, you may also be given: ?Laxative medicines. These help you have a bowel movement. ?Instructions for enema use. An enema is liquid medicine injected into your rectum. ?Medicines ?Ask your health care provider about: ?Changing or stopping your regular medicines or supplements. This is especially important if you are taking iron supplements, diabetes medicines, or blood thinners. ?Taking medicines such as aspirin and ibuprofen. These medicines can thin your blood. Do not take these medicines unless your health care provider tells you to take them. ?Taking over-the-counter medicines, vitamins, herbs, and supplements. ?General instructions ?Ask your health care provider what steps will be taken to help prevent infection. These may include washing skin with a germ-killing soap. ?If you will be going home right after the procedure, plan to have a responsible adult: ?Take you home from the hospital or clinic. You will not be allowed to drive. ?Care for you for the time you are told. ?What happens during the procedure? ? ?An IV will be inserted into one of your veins. ?You will be given a medicine to make you fall asleep (general anesthetic). ?You will lie on your side with your knees bent. ?A lubricant will be put on the tube. Then the tube will be: ?Inserted into your anus. ?Gently eased through all parts of your large intestine. ?Air will be sent into your colon to keep it open. This may cause some pressure or cramping. ?Images will be taken with the camera and will appear on a screen. ?A small tissue sample may be removed to be looked at under a microscope (biopsy). The tissue may be sent  to a lab for testing if any signs of problems are found. ?If small polyps are found, they may be removed and checked for cancer cells. ?When the procedure is finished, the tube will be removed. ?The procedure may vary among health care providers and hospitals. ?What happens after the procedure? ?Your blood pressure, heart rate, breathing rate, and blood oxygen level will be monitored until you leave the hospital or clinic. ?You may have a small amount of blood in your stool. ?You may pass gas and have mild cramping or bloating in your abdomen. This is caused by the air that was used to open your colon during the exam. ?If you were given a sedative during the procedure, it can affect you for several hours. Do not drive or operate machinery until your health care provider says that it is safe. ?It is up to you to get the results of your procedure. Ask your health care provider, or the department that is doing the procedure, when your results will be ready. ?Summary ?A colonoscopy is a procedure to look at the entire large intestine. ?Follow instructions from your health care provider about eating and drinking  before the procedure. ?If you were prescribed an oral bowel prep to clean out your colon, take it as told by your health care provider. ?During the colonoscopy, a flexible tube with a camera on its end is inserted into the anus and then passed into all parts of the large intestine. ?This information is not intended to replace advice given to you by your health care provider. Make sure you discuss any questions you have with your health care provider. ?Document Revised: 11/07/2021 Document Reviewed: 07/06/2021 ?Elsevier Patient Education ? [employer]. ? ?Monitored Anesthesia Care ?Anesthesia refers to techniques, procedures, and medicines that help a person stay safe and comfortable during a medical or dental procedure. Monitored anesthesia care, or sedation, is one type of anesthesia. Your anesthesia  specialist may recommend sedation if you will be having a procedure that does not require you to be unconscious. You may have this procedure for: ?Cataract surgery. ?A dental procedure. ?A biopsy. ?A colonosc

## 2022-03-21 ENCOUNTER — Encounter (HOSPITAL_COMMUNITY)
Admission: RE | Admit: 2022-03-21 | Discharge: 2022-03-21 | Disposition: A | Discharge status: Home / Self Care | Payer: Medicare Other | Source: Ambulatory Visit | Attending: Internal Medicine | Admitting: Internal Medicine

## 2022-03-22 ENCOUNTER — Encounter (HOSPITAL_COMMUNITY): Payer: Self-pay | Admitting: Anesthesiology

## 2022-03-22 ENCOUNTER — Telehealth: Payer: Self-pay | Admitting: Internal Medicine

## 2022-03-22 NOTE — Telephone Encounter (Signed)
Called pt, she needs to cancel TCS for tomorrow due to not having a ride. Informed her we will call back to reschedule when Dr. Roseanne Kaufman next schedule is available. ? ?Virginia [name], she was seen 11/2021 is it ok to reschedule TCS? ?

## 2022-03-22 NOTE — Telephone Encounter (Signed)
Pt needs to reschedule her procedure with Dr Gala Romney for tomorrow because she doesn't have anyone to ride with her. Please call [phone] ?

## 2022-03-23 ENCOUNTER — Encounter (HOSPITAL_COMMUNITY): Admission: RE | Payer: Self-pay | Source: Ambulatory Visit

## 2022-03-23 ENCOUNTER — Ambulatory Visit (HOSPITAL_COMMUNITY): Admission: RE | Admit: 2022-03-23 | Payer: Medicare Other | Source: Ambulatory Visit | Admitting: Internal Medicine

## 2022-03-23 SURGERY — COLONOSCOPY WITH PROPOFOL
Anesthesia: Monitor Anesthesia Care

## 2022-03-24 NOTE — Telephone Encounter (Signed)
Recommend virtual visit visit just to make sure we are current on everything. If she prefers inperson ov that's ok too.  ?

## 2022-03-24 NOTE — Telephone Encounter (Signed)
Virginia [name], please schedule virtual visit or in person if pt prefers. She needs to reschedule procedure. ?

## 2022-03-28 ENCOUNTER — Encounter: Payer: Self-pay | Admitting: Orthopedic Surgery

## 2022-03-28 ENCOUNTER — Ambulatory Visit (INDEPENDENT_AMBULATORY_CARE_PROVIDER_SITE_OTHER): Payer: Medicare Other | Admitting: Orthopedic Surgery

## 2022-03-28 ENCOUNTER — Ambulatory Visit (INDEPENDENT_AMBULATORY_CARE_PROVIDER_SITE_OTHER): Payer: Medicare Other

## 2022-03-28 VITALS — Ht 63.0 in | Wt 190.0 lb

## 2022-03-28 DIAGNOSIS — S42125D Nondisplaced fracture of acromial process, left shoulder, subsequent encounter for fracture with routine healing: Secondary | ICD-10-CM | POA: Diagnosis not present

## 2022-03-28 DIAGNOSIS — Z96612 Presence of left artificial shoulder joint: Secondary | ICD-10-CM

## 2022-03-28 NOTE — Telephone Encounter (Signed)
Pt agreed to a virtual appointment with [facility] at 4pm on 04/03/2022.  ?

## 2022-03-28 NOTE — Telephone Encounter (Signed)
Noted  

## 2022-03-28 NOTE — Progress Notes (Signed)
Orthopaedic Postop Note ? ?Assessment: ?Virginia [name] is a 76 y.o. female s/p Left Reverse Shoulder Arthroplasty (DOS: 09/29/2021); recently developed an acromial stress fracture. ? ?Plan: ?Radiographs obtained today demonstrate stable appearance of the prosthesis.  However, there does appear to be a vertical stress fracture of the midportion of the acromion.  No displacement.  This is in the proximity of her pain.  Her pain is improving compared to the last visit, but certain motions still cause a lot of discomfort.  Advised her to remain in the sling, with limited motion for an additional 2 weeks.  Shortly thereafter, she can start to gently work on range of motion.  If the fracture heals, we will not need to proceed with any further intervention.  This was discussed with the patient, and she is in agreement with this plan. ? ? ?Follow-up: ?Return in about 4 weeks (around 04/25/2022). ? ?XR at next visit: Left shoulder ? ?Subjective: ? ?Chief Complaint  ?Patient presents with  ? Routine Post Op  ?  Lt Shoulder DOS 09/29/21  ? ? ?History of Present Illness: ?Virginia [name] is a 76 y.o. female who presents following the above stated procedure.  Surgery was approximately 6 months ago.  I saw her approximately 1 month ago, with sudden onset of pain in the lateral shoulder.  She has remained in a sling.  She continues to have pain with some motions.  However, she states that her pain is improving.  No numbness or tingling.  In particular, she notes improvement in her symptoms when she braces her elbow with the right hand. ? ?Review of Systems: ?No fevers or chills ?No numbness or tingling ?No Chest Pain ?No shortness of breath  ? ? ?Objective: ?Ht '5\' 3"'$  (1.6 m)   Wt 190 lb (86.2 kg)   BMI 33.66 kg/m?  ? ?Physical Exam: ? ?Alert and oriented.  No acute distress. ? ?Surgical incision has healed well.  No surrounding erythema or drainage.  Tender to palpation directly over the mid aspect of the acromion.  No  bruising is appreciated in this area.  Sensation is intact over the axillary nerve distribution.  Fingers are warm and well-perfused. ? ? ?IMAGING: ?I personally ordered and reviewed the following images: ? ?X-ray of the left shoulder was obtained in clinic today.  Prosthesis remains in excellent position.  No evidence of subsidence.  There is a vertically oriented lucent line in the mid aspect of the acromion, best visualized on the scapular Y view.  There is no surrounding callus formation.  No displacement of this fracture. ? ?Impression: Left reverse shoulder arthroplasty in stable position, with fracture of the mid portion of the acromion. ? ?Mordecai Rasmussen, MD ?03/28/2022 ?10:33 PM ? ? ?

## 2022-04-03 ENCOUNTER — Telehealth: Payer: Self-pay | Admitting: *Deleted

## 2022-04-03 ENCOUNTER — Ambulatory Visit: Payer: Medicare Other | Admitting: Adult Health

## 2022-04-03 ENCOUNTER — Encounter: Payer: Self-pay | Admitting: Gastroenterology

## 2022-04-03 ENCOUNTER — Telehealth: Payer: Medicare Other | Admitting: Gastroenterology

## 2022-04-03 ENCOUNTER — Telehealth (INDEPENDENT_AMBULATORY_CARE_PROVIDER_SITE_OTHER): Payer: Medicare Other | Admitting: Gastroenterology

## 2022-04-03 VITALS — Ht 63.0 in | Wt 190.0 lb

## 2022-04-03 DIAGNOSIS — K5900 Constipation, unspecified: Secondary | ICD-10-CM

## 2022-04-03 DIAGNOSIS — Z20822 Contact with and (suspected) exposure to covid-19: Secondary | ICD-10-CM | POA: Diagnosis not present

## 2022-04-03 DIAGNOSIS — Z86010 Personal history of colonic polyps: Secondary | ICD-10-CM | POA: Diagnosis not present

## 2022-04-03 NOTE — Telephone Encounter (Signed)
Pt consented to a virtual visit. 

## 2022-04-03 NOTE — Patient Instructions (Addendum)
We will call to get you scheduled for colonoscopy once the June schedule is out. Be sure to discuss with your surgeon regarding possible positioning during colonoscopy (laying on the left side). I will also see if you can be positioned any other ways to avoid the left shoulder.  ?Start Miralax 17 grams (one capful) twice daily until soft regular stools. Then continue once daily. Continue Sennakot for now but if stools become too loose or too frequent, then you can stop Sennakot.  ?Come by the office and pick up Linzess samples, these will be used starting five days before your colonoscopy along with the Clenpiq you already have at home. Once you have been scheduled, you will be provided with written instructions.  ? ?

## 2022-04-03 NOTE — Telephone Encounter (Signed)
Virginia [name], you are scheduled for a virtual visit with your provider today.  Just as we do with appointments in the office, we must obtain your consent to participate.  Your consent will be active for this visit and any virtual visit you may have with one of our providers in the next 365 days.  If you have a MyChart account, I can also send a copy of this consent to you electronically.  All virtual visits are billed to your insurance company just like a traditional visit in the office.  As this is a virtual visit, video technology does not allow for your provider to perform a traditional examination.  This may limit your provider's ability to fully assess your condition.  If your provider identifies any concerns that need to be evaluated in person or the need to arrange testing such as labs, EKG, etc, we will make arrangements to do so.  Although advances in technology are sophisticated, we cannot ensure that it will always work on either your end or our end.  If the connection with a video visit is poor, we may have to switch to a telephone visit.  With either a video or telephone visit, we are not always able to ensure that we have a secure connection.   I need to obtain your verbal consent now.   Are you willing to proceed with your visit today?  ?

## 2022-04-03 NOTE — Progress Notes (Signed)
? ? ? ? ?Primary Care Physician:  Redmond School, MD ?Primary GI:  Garfield Cornea, MD ? ?  ?Patient Location: Home ? ?Provider Location: [facility] office ? ?Reason for Visit: reschedule colonoscopy ? ?Persons present on the virtual encounter, with roles: Patient, myself (provider), Tammy clifton, CMA (updated meds and allergies) ? ?Total time (minutes) spent on medical discussion: 12 minutes ? ?Due to COVID-19, visit was conducted using Mychart video method.  Visit was requested by patient. ? ?Virtual Visit via Mychart video ? ?I connected with Virginia [name] on 04/03/22 at  4:00 PM EDT by Mychart video and verified that I am speaking with the correct person using two identifiers. ?  ?I discussed the limitations, risks, security and privacy concerns of performing an evaluation and management service by telephone/video and the availability of in person appointments. I also discussed with the patient that there may be a patient responsible charge related to this service. The patient expressed understanding and agreed to proceed. ? ? ?HPI:   ?Virginia [name] is a 76 y.o. female who presents for virtual visit regarding rescheduling colonoscopy.  She was seen back in January to schedule surveillance colonoscopy for history of adenomatous colon polyps.  Last colonoscopy in 2017.  Called recently to reschedule her procedure because she did not have a ride. ? ?Patient had left shoulder replacement 09/2021, about four weeks ago she developed new pain and was noted to have stress fracture of the midportion of the acromion. She is wearing brace most of the time and goes back to see her provider at the end of the month. Right now she is not laying on the left side due to her shoulder.  ? ?She states her stools are more sluggish. BM 2-3 times per week and very dry. No melena, brbpr. Sennakot 2 daily, not as helpful as in the past. She has not tried Miralax or prescription options. No abdominal pain. No ugi symptoms.  ?  ? ?Last  colonoscopy in 2017. ?- Diverticulosis in the entire examined colon. ?- Redundant colon. ?- The examination was otherwise normal on direct and retroflexion views. ?- No specimens collected.  ?-Next colonoscopy in 5 years. ?  ? ? ?Current Outpatient Medications  ?Medication Sig Dispense Refill  ? ALPRAZolam (XANAX) 0.5 MG tablet Take 0.5 mg by mouth at bedtime. Pt can take up to 3 tabs daily as needed    ? amLODipine (NORVASC) 2.5 MG tablet Take 2.5 mg by mouth daily.    ? Ascorbic Acid (VITAMIN C) 1000 MG tablet Take 1,000 mg by mouth daily.    ? aspirin EC 81 MG tablet Take 81 mg by mouth daily.    ? atorvastatin (LIPITOR) 10 MG tablet Take 1 tablet (10 mg total) by mouth daily. (Patient taking differently: Take 10 mg by mouth every evening.) 30 tablet 1  ? b complex vitamins tablet Take 1 tablet by mouth daily.    ? Cholecalciferol (VITAMIN D-3) 1000 UNITS CAPS Take 1,000 Units by mouth daily.    ? DULoxetine (CYMBALTA) 60 MG capsule Take 60 mg by mouth daily.    ? fluocinonide ointment (LIDEX) 2.42 % Apply 1 application topically 2 (two) times daily as needed (irritation).    ? losartan (COZAAR) 100 MG tablet TAKE 1 TABLET BY MOUTH EVERY DAY (Patient taking differently: Take 100 mg by mouth daily.) 30 tablet 2  ? Multiple Vitamins-Minerals (ICAPS AREDS 2 PO) Take 1 capsule by mouth in the morning and at bedtime.    ?  Multiple Vitamins-Minerals (MULTIVITAMIN WITH MINERALS) tablet Take 1 tablet by mouth daily.    ? rOPINIRole (REQUIP) 0.5 MG tablet Take 1 tablet (0.5 mg total) by mouth at bedtime. 90 tablet 1  ? senna-docusate (SENOKOT-S) 8.6-50 MG tablet Take 2 tablets by mouth daily.    ? SYNTHROID 150 MCG tablet Take 150 mcg by mouth daily before breakfast.    ? ?No current facility-administered medications for this visit.  ? ? ?Past Medical History:  ?Diagnosis Date  ? Anxiety   ? Depression   ? HTN (hypertension)   ? resolved with weight loss s/p gastric bypass  ? Hypothyroidism   ? Since at least 2007  ?  Osteoporosis   ? RLS (restless legs syndrome)   ? Sleep apnea   ? Stroke [facility])   ? 2013/February 2017  ? ? ?Past Surgical History:  ?Procedure Laterality Date  ? BREAST BIOPSY Right   ? benign  ? CATARACT EXTRACTION  2018  ? CHOLECYSTECTOMY    ? COLONOSCOPY  2004  ? internal hemorrhoids, left-sided diverticulae, splenic flexure polyp (19m), path unavailable at this time  ? COLONOSCOPY  04/2011  ? RMR: Internal and external hemorrhoids, rectal tubular adenoma removed, left-sided diverticulosis  ? COLONOSCOPY N/A 06/01/2016  ? Procedure: COLONOSCOPY;  Surgeon: RDaneil Dolin MD;  Location: [facility] SUITE;  Service: Endoscopy;  Laterality: N/A;  0830  ? ESOPHAGOGASTRODUODENOSCOPY  2004  ? small hiatal hernia  ? GASTRIC ROUX-EN-Y  2006  ? REVERSE SHOULDER ARTHROPLASTY Left 09/29/2021  ? Procedure: LEFT REVERSE SHOULDER ARTHROPLASTY;  Surgeon: CMordecai Rasmussen MD;  Location: [facility] ORS;  Service: Orthopedics;  Laterality: Left;  ? ? ?Family History  ?Problem Relation Age of Onset  ? Lung cancer Father   ? Stroke Father   ? Stroke Mother   ? Stroke Brother   ? Hypertension Brother   ? Stroke Maternal Grandmother   ? Stroke Maternal Grandfather   ? Cancer Other   ?     family history   ? Coronary artery disease Other   ?     family history   ? Arthritis Other   ?     family history   ? Cancer Daughter   ? ? ?Social History  ? ?Socioeconomic History  ? Marital status: Divorced  ?  Spouse name: Not on file  ? Number of children: 1  ? Years of education: college  ? Highest education level: Not on file  ?Occupational History  ? Occupation: retired   ?  Employer: RETIRED  ?Tobacco Use  ? Smoking status: Former  ?  Types: Cigarettes  ?  Quit date: 11/28/1987  ?  Years since quitting: 34.3  ? Smokeless tobacco: Never  ? Tobacco comments:  ?  Quit x 25 plus years  ?Vaping Use  ? Vaping Use: Never used  ?Substance and Sexual Activity  ? Alcohol use: No  ?  Alcohol/week: 0.0 standard drinks  ? Drug use: No  ? Sexual activity: Not on file   ?Other Topics Concern  ? Not on file  ?Social History Narrative  ? Caffeine 6 cups daily.  Retired/[employer],   2 yrs college,  Divorced, one daughter.  ? ?Social Determinants of Health  ? ?Financial Resource Strain: Not on file  ?Food Insecurity: Not on file  ?Transportation Needs: Not on file  ?Physical Activity: Not on file  ?Stress: Not on file  ?Social Connections: Not on file  ?Intimate Partner Violence: Not on file  ? ? ?  ?  ROS: ? ?General: Negative for anorexia, weight loss, fever, chills, fatigue, weakness. ?Eyes: Negative for vision changes.  ?ENT: Negative for hoarseness, difficulty swallowing , nasal congestion. ?CV: Negative for chest pain, angina, palpitations, dyspnea on exertion, peripheral edema.  ?Respiratory: Negative for dyspnea at rest, dyspnea on exertion, cough, sputum, wheezing.  ?GI: See history of present illness. ?GU:  Negative for dysuria, hematuria, urinary incontinence, urinary frequency, nocturnal urination.  ?MS: Negative for joint pain, low back pain.  ?Derm: Negative for rash or itching.  ?Neuro: Negative for weakness, abnormal sensation, seizure, frequent headaches, memory loss, confusion.  ?Psych: Negative for anxiety, depression, suicidal ideation, hallucinations.  ?Endo: Negative for unusual weight change.  ?Heme: Negative for bruising or bleeding. ?Allergy: Negative for rash or hives. ?  ?Observations/Objective: ? ?Pleasant well nourished female in no acute distress. No SOB. Good color.  ? ? ?Lab Results  ?Component Value Date  ? CREATININE 1.04 (H) 11/01/2021  ? BUN 16 11/01/2021  ? NA 142 11/01/2021  ? K 4.5 11/01/2021  ? CL 105 11/01/2021  ? CO2 22 11/01/2021  ? ?Lab Results  ?Component Value Date  ? WBC 6.7 09/27/2021  ? HGB 12.1 09/27/2021  ? HCT 38.5 09/27/2021  ? MCV 90.2 09/27/2021  ? PLT 304 09/27/2021  ? ? ? ?Assessment and Plan: ? ?H/o adenomatous colon polyps.  Due for surveillance colonoscopy.  She had to cancel recent colonoscopy due to lack of ride.  She  continues to have some issues with constipation, less controlled with Senokot.  BMs couple times per week.  We will add MiraLAX 17 g twice daily until soft stool, then continue once daily.  She has been Clenpiq prep

## 2022-04-10 NOTE — Progress Notes (Signed)
Cardiology Office Note:    Date:  04/14/2022   ID:  Virginia [name], DOB October 21, 1946, MRN [id number]  PCP:  Redmond School, MD   [place] [facility] Cardiologist:  Kate Sable, MD (Inactive)     Referring MD: Redmond School, MD   CC: Palpitations Consulted for the evaluation of palpitations at the behest of Redmond School, MD   History of Present Illness:    Virginia [name] is a 76 y.o. female with a hx of Stroke and HLD, OSA on CPAP, seen 04/14/22.  Patient notes that she is feeling anxious but feels ok.   She has had issues with itching.  No meds.  Has pollen but no changes in Spring time.  About a year ago would have sudden onset racing in her heart after watching TV.  Was also having skipped beats.  Lately no chest pain, chest pressure, shortness of breath unless long walk.  Patient reports prior cardiac testing including and echo from 2017 and a NM stress test with hypertensive response from 2018.   Past Medical History:  Diagnosis Date   Anxiety    Depression    HTN (hypertension)    resolved with weight loss s/p gastric bypass   Hypothyroidism    Since at least 2007   Osteoporosis    RLS (restless legs syndrome)    Sleep apnea    Stress fracture of left shoulder    Stroke [facility])    2013/February 2017    Past Surgical History:  Procedure Laterality Date   BREAST BIOPSY Right    benign   CATARACT EXTRACTION  2018   CHOLECYSTECTOMY     COLONOSCOPY  2004   internal hemorrhoids, left-sided diverticulae, splenic flexure polyp (8m), path unavailable at this time   COLONOSCOPY  04/2011   RMR: Internal and external hemorrhoids, rectal tubular adenoma removed, left-sided diverticulosis   COLONOSCOPY N/A 06/01/2016   Procedure: COLONOSCOPY;  Surgeon: RDaneil Dolin MD;  Location: [facility];  Service: Endoscopy;  Laterality: N/A;  0830   ESOPHAGOGASTRODUODENOSCOPY  2004   small hiatal hernia   GASTRIC ROUX-EN-Y  2006   REVERSE SHOULDER  ARTHROPLASTY Left 09/29/2021   Procedure: LEFT REVERSE SHOULDER ARTHROPLASTY;  Surgeon: CMordecai Rasmussen MD;  Location: [facility] ORS;  Service: Orthopedics;  Laterality: Left;    Current Medications: Current Meds  Medication Sig   ALPRAZolam (XANAX) 0.5 MG tablet Take 0.5 mg by mouth at bedtime. Pt can take up to 3 tabs daily as needed   amLODipine (NORVASC) 2.5 MG tablet Take 2.5 mg by mouth daily.   Ascorbic Acid (VITAMIN C) 1000 MG tablet Take 1,000 mg by mouth daily.   aspirin EC 81 MG tablet Take 81 mg by mouth daily.   atorvastatin (LIPITOR) 10 MG tablet Take 1 tablet (10 mg total) by mouth daily. (Patient taking differently: Take 10 mg by mouth every evening.)   b complex vitamins tablet Take 1 tablet by mouth daily.   Cholecalciferol (VITAMIN D-3) 1000 UNITS CAPS Take 1,000 Units by mouth daily.   DULoxetine (CYMBALTA) 60 MG capsule Take 60 mg by mouth daily.   fluocinonide ointment (LIDEX) 09.98% Apply 1 application topically 2 (two) times daily as needed (irritation).   losartan (COZAAR) 100 MG tablet TAKE 1 TABLET BY MOUTH EVERY DAY (Patient taking differently: Take 100 mg by mouth daily.)   Multiple Vitamins-Minerals (ICAPS AREDS 2 PO) Take 1 capsule by mouth in the morning and at bedtime.   Multiple Vitamins-Minerals (MULTIVITAMIN  WITH MINERALS) tablet Take 1 tablet by mouth daily.   rOPINIRole (REQUIP) 0.5 MG tablet Take 1 tablet (0.5 mg total) by mouth at bedtime. (Patient taking differently: Take 0.5 mg by mouth 2 (two) times daily as needed.)   senna-docusate (SENOKOT-S) 8.6-50 MG tablet Take 2 tablets by mouth daily.   SYNTHROID 150 MCG tablet Take 150 mcg by mouth daily before breakfast.     Allergies:   Augmentin [amoxicillin-pot clavulanate]   Social History   Socioeconomic History   Marital status: Divorced    Spouse name: Not on file   Number of children: 1   Years of education: college   Highest education level: Not on file  Occupational History   Occupation:  retired     Fish farm manager: RETIRED  Tobacco Use   Smoking status: Former    Types: Cigarettes    Quit date: 11/28/1987    Years since quitting: 34.4   Smokeless tobacco: Never   Tobacco comments:    Quit x 25 plus years  Vaping Use   Vaping Use: Never used  Substance and Sexual Activity   Alcohol use: No    Alcohol/week: 0.0 standard drinks   Drug use: No   Sexual activity: Not Currently  Other Topics Concern   Not on file  Social History Narrative   Caffeine 6 cups daily.  Retired/[employer],   2 yrs college,  Divorced, one daughter.   Social Determinants of Health   Financial Resource Strain: Not on file  Food Insecurity: Not on file  Transportation Needs: Not on file  Physical Activity: Not on file  Stress: Not on file  Social Connections: Not on file     Family History: [name] family history includes Arthritis in an other family member; Cancer in her daughter and another family member; Coronary artery disease in an other family member; Hypertension in her brother; Lung cancer in her father; Stroke in her brother, father, maternal grandfather, maternal grandmother, and mother. CHF in mother.  ROS:   Please see the history of present illness.     All other systems reviewed and are negative.  EKGs/Labs/Other Studies Reviewed:    The following studies were reviewed today:  EKG:  EKG is  ordered today.  The ekg ordered today demonstrates  04/14/22: SR 80   Recent Labs: 09/27/2021: Hemoglobin 12.1; Platelets 304 11/01/2021: ALT 15; BUN 16; Creatinine, Ser 1.04; Potassium 4.5; Sodium 142; TSH 1.770  Recent Lipid Panel    Component Value Date/Time   CHOL 184 01/05/2016 0437   TRIG 65 01/05/2016 0437   HDL 61 01/05/2016 0437   CHOLHDL 3.0 01/05/2016 0437   VLDL 13 01/05/2016 0437   LDLCALC 110 (H) 01/05/2016 0437         Physical Exam:    VS:  BP 138/60   Pulse 80   Ht '5\' 3"'$  (1.6 m)   Wt 190 lb (86.2 kg)   SpO2 98%   BMI 33.66 kg/m     Wt Readings from  Last 3 Encounters:  04/14/22 190 lb (86.2 kg)  04/03/22 190 lb (86.2 kg)  03/28/22 190 lb (86.2 kg)     GEN:  Well nourished, well developed in no acute distress HEENT: Normal NECK: No JVD; No carotid bruits LYMPHATICS: No lymphadenopathy CARDIAC: RRR with rare irregular beats, no murmurs, rubs, gallops RESPIRATORY:  Clear to auscultation without rales, wheezing or rhonchi  ABDOMEN: Soft, non-tender, non-distended MUSCULOSKELETAL:  No edema; No deformity  SKIN: Warm and dry NEUROLOGIC:  Alert and oriented x 3 PSYCHIATRIC:  Normal affect   ASSESSMENT:    1. Palpitations   2. Tachycardia   3. Cerebrovascular accident (CVA), unspecified mechanism ([place])   4. Mixed hyperlipidemia   5. OSA (obstructive sleep apnea)    PLAN:    Tachycardia  Palpitations OSA on CPAP - PACs vs PVCs, will get 14 day on live ziopatch - if AF or AFL we discussed stopping ASA, starting AC, and getting echo - if frequent PVCs, will get echo - if SVT , will start AV nodal therapy  Prior Stroke HLD - LDL < 70, continue asa and statin  One year unless the above        Medication Adjustments/Labs and Tests Ordered: Current medicines are reviewed at length with the patient today.  Concerns regarding medicines are outlined above.  Orders Placed This Encounter  Procedures   EKG 12-Lead   No orders of the defined types were placed in this encounter.   Patient Instructions  Medication Instructions:  Your physician recommends that you continue on your current medications as directed. Please refer to the Current Medication list given to you today.   Labwork: None  Testing/Procedures: None  Follow-Up: Follow up with Dr. Gasper Sells in 1 year.  Any Other Special Instructions Will Be Listed Below (If Applicable).     If you need a refill on your cardiac medications before your next appointment, please call your pharmacy.    Signed, Werner Lean, MD  04/14/2022 1:58 PM     [facility]

## 2022-04-14 ENCOUNTER — Encounter: Payer: Self-pay | Admitting: Internal Medicine

## 2022-04-14 ENCOUNTER — Other Ambulatory Visit: Payer: Self-pay | Admitting: Internal Medicine

## 2022-04-14 ENCOUNTER — Ambulatory Visit (INDEPENDENT_AMBULATORY_CARE_PROVIDER_SITE_OTHER): Payer: Medicare Other

## 2022-04-14 ENCOUNTER — Ambulatory Visit (INDEPENDENT_AMBULATORY_CARE_PROVIDER_SITE_OTHER): Payer: Medicare Other | Admitting: Internal Medicine

## 2022-04-14 VITALS — BP 138/60 | HR 80 | Ht 63.0 in | Wt 190.0 lb

## 2022-04-14 DIAGNOSIS — R002 Palpitations: Secondary | ICD-10-CM

## 2022-04-14 DIAGNOSIS — I639 Cerebral infarction, unspecified: Secondary | ICD-10-CM | POA: Diagnosis not present

## 2022-04-14 DIAGNOSIS — R000 Tachycardia, unspecified: Secondary | ICD-10-CM

## 2022-04-14 DIAGNOSIS — E782 Mixed hyperlipidemia: Secondary | ICD-10-CM

## 2022-04-14 DIAGNOSIS — G4733 Obstructive sleep apnea (adult) (pediatric): Secondary | ICD-10-CM | POA: Diagnosis not present

## 2022-04-14 NOTE — Patient Instructions (Signed)
Medication Instructions:  Your physician recommends that you continue on your current medications as directed. Please refer to the Current Medication list given to you today.   Labwork: None  Testing/Procedures: None  Follow-Up: Follow up with Dr. Gasper Sells in 1 year.  Any Other Special Instructions Will Be Listed Below (If Applicable).     If you need a refill on your cardiac medications before your next appointment, please call your pharmacy.

## 2022-04-18 ENCOUNTER — Telehealth: Payer: Self-pay | Admitting: Internal Medicine

## 2022-04-18 NOTE — Telephone Encounter (Signed)
Patient called the company bout her heart monitor because the orange light was blinking fast. Company says the machine is defective. Please advise

## 2022-04-18 NOTE — Telephone Encounter (Signed)
Spoke with pt who states that she spoke with Jun from [place]. Pt reports that Jun informed her that monitor is defective since it is blinking orange. He informed her that she will not be charged for the monitor but will have to have another placed by the office. I spoke with Ailene Ravel who is the [facility] rep for our area. She will ensure that pt is not charged for monitor. Ailene Ravel will change serial number to report new monitor that will be placed in office on Thursday. New serial number is [id number]. Pt agrees to have another monitor placed. Both reports will be combined on final report.

## 2022-04-19 ENCOUNTER — Telehealth: Payer: Self-pay | Admitting: Gastroenterology

## 2022-04-19 NOTE — Telephone Encounter (Signed)
Please let patient know that I discussed with Dr. Gala Romney and he can work with her as far as what side she lays on for colonoscopy. She cannot tolerate laying of left side so she had plans to hold off until seen by ortho (Patient had left shoulder replacement 09/2021, about four weeks ago she developed new pain and was noted to have stress fracture of the midportion of the acromion.).   [facility] clinical have orders from when I saw her couple of weeks ago.  I also noted that she recently saw cardiologist and they are having her wear a heart monitor. I would suggest holding off on scheduling her colonoscopy until she has her results back and knows it's ok to proceed from cardiology standpoint.

## 2022-04-19 NOTE — Telephone Encounter (Signed)
Pt was made aware and verbalized understanding.  

## 2022-04-26 ENCOUNTER — Ambulatory Visit (INDEPENDENT_AMBULATORY_CARE_PROVIDER_SITE_OTHER): Payer: Medicare Other | Admitting: Orthopedic Surgery

## 2022-04-26 ENCOUNTER — Ambulatory Visit (INDEPENDENT_AMBULATORY_CARE_PROVIDER_SITE_OTHER): Payer: Medicare Other

## 2022-04-26 ENCOUNTER — Encounter: Payer: Self-pay | Admitting: Orthopedic Surgery

## 2022-04-26 VITALS — Ht 63.0 in | Wt 190.0 lb

## 2022-04-26 DIAGNOSIS — S42125D Nondisplaced fracture of acromial process, left shoulder, subsequent encounter for fracture with routine healing: Secondary | ICD-10-CM | POA: Diagnosis not present

## 2022-04-26 DIAGNOSIS — Z96612 Presence of left artificial shoulder joint: Secondary | ICD-10-CM

## 2022-04-26 NOTE — Progress Notes (Signed)
Orthopaedic Postop Note  Assessment: Virginia [name] is a 76 y.o. female s/p Left Reverse Shoulder Arthroplasty (DOS: 09/29/2021); recently developed an acromial stress fracture.  Plan: Her pain has improved.  Range of motion has improved.  Radiographs today are without obvious fracture in the acromion, compared to last visit.  It is okay for her to gradually increase her range of motion, and then transition back to strengthening, as she was doing prior to sustaining this injury.  She is in agreement with this plan.  I would like to see her at the approximate 1 year anniversary of surgery.  Follow-up as needed prior to this appointment.   Follow-up: Return in about 5 months (around 09/26/2022).  XR at next visit: Left shoulder  Subjective:  Chief Complaint  Patient presents with   Routine Post Op    Lt TSA DOS 09/29/22    History of Present Illness: Virginia [name] is a 76 y.o. female who presents following the above stated procedure.  Surgery was approximately 7 months ago.  Approximately 6 weeks ago, she developed severe lateral based shoulder pain.  She spent some time in her sling.  Radiographs in clinic demonstrated a stress fracture in the mid aspect of the acromion.  Since I last saw her in clinic, she has been very careful.  She states that approximately 2 weeks ago, she was ready to contact the clinic because the pain was still bothering her.  Subsequently, the pain essentially improved overnight.  Since then, she has felt much better.  Her range of motion is getting better.  She has not started with her physical therapy exercises however.  Review of Systems: No fevers or chills No numbness or tingling No Chest Pain No shortness of breath    Objective: Ht '5\' 3"'$  (1.6 m)   Wt 190 lb (86.2 kg)   BMI 33.66 kg/m   Physical Exam:  Alert and oriented.  No acute distress.  Left shoulder surgical incision is healing well.  No surrounding erythema or drainage.  No  tenderness to palpation directly over the acromion.  Active forward elevation to 110 degrees.  Active abduction to 90 degrees.  Strength testing deferred.  Fingers are warm and well-perfused.  Sensation is intact within the axillary nerve distribution.   IMAGING: I personally ordered and reviewed the following images:  X-ray of the left shoulder was obtained in clinic today.  This was compared to prior x-rays.  There has been no interval change in the positioning or appearance of the prosthesis.  No evidence of subsidence.  There are no lucency around any of the screws or the stem.  The previously visualized vertical fracture in the mid portion of the acromion is not well visualized.  There is a small amount of callus in the superior aspect of the acromion.  Impression: Left reverse shoulder arthroplasty in stable position, with improvement of the acromion stress fracture.  Mordecai Rasmussen, MD 04/26/2022 10:33 PM

## 2022-05-09 DIAGNOSIS — R002 Palpitations: Secondary | ICD-10-CM | POA: Diagnosis not present

## 2022-05-15 ENCOUNTER — Other Ambulatory Visit: Payer: Self-pay | Admitting: Neurology

## 2022-05-17 ENCOUNTER — Encounter: Payer: Self-pay | Admitting: Adult Health

## 2022-05-17 ENCOUNTER — Ambulatory Visit (INDEPENDENT_AMBULATORY_CARE_PROVIDER_SITE_OTHER): Payer: Medicare Other | Admitting: Adult Health

## 2022-05-17 VITALS — BP 131/73 | HR 74 | Ht 63.0 in | Wt 189.0 lb

## 2022-05-17 DIAGNOSIS — G4733 Obstructive sleep apnea (adult) (pediatric): Secondary | ICD-10-CM

## 2022-05-17 DIAGNOSIS — Z9989 Dependence on other enabling machines and devices: Secondary | ICD-10-CM

## 2022-05-17 DIAGNOSIS — I639 Cerebral infarction, unspecified: Secondary | ICD-10-CM | POA: Diagnosis not present

## 2022-05-17 NOTE — Progress Notes (Signed)
PATIENT: Virginia [name] DOB: 02/07/1946  REASON FOR VISIT: follow up HISTORY FROM: patient PRIMARY NEUROLOGIST: Dr. Brett Fairy  Chief Complaint  Patient presents with   OSA on CPAP    Rm 19, 6 month FU  "I don't feel any difference from using CPAP; didn't use when congested and when I had surgery in Nov"     HISTORY OF PRESENT ILLNESS: Today 05/17/22:  Virginia [name] is a 76 year old female with a history of OSA on CPAP. She returns today for follow-up.  She states initially the CPAP was working well for her.  And she noticed the difference.She is fatigued throughout the day.  She does acknowledge she does not have good sleep hygiene.  She does drink caffeine and has screen time before bedtime.    REVIEW OF SYSTEMS: Out of a complete 14 system review of symptoms, the patient complains only of the following symptoms, and all other reviewed systems are negative.  ESS 11  ALLERGIES: Allergies  Allergen Reactions   Augmentin [Amoxicillin-Pot Clavulanate] Anaphylaxis    HOME MEDICATIONS: Outpatient Medications Prior to Visit  Medication Sig Dispense Refill   ALPRAZolam (XANAX) 0.5 MG tablet Take 0.5 mg by mouth at bedtime. Pt can take up to 3 tabs daily as needed     amLODipine (NORVASC) 2.5 MG tablet Take 2.5 mg by mouth daily.     Ascorbic Acid (VITAMIN C) 1000 MG tablet Take 1,000 mg by mouth daily.     aspirin EC 81 MG tablet Take 81 mg by mouth daily.     atorvastatin (LIPITOR) 10 MG tablet Take 1 tablet (10 mg total) by mouth daily. (Patient taking differently: Take 10 mg by mouth every evening.) 30 tablet 1   b complex vitamins tablet Take 1 tablet by mouth daily.     Cholecalciferol (VITAMIN D-3) 1000 UNITS CAPS Take 1,000 Units by mouth daily.     DULoxetine (CYMBALTA) 60 MG capsule Take 60 mg by mouth daily.     fluocinonide ointment (LIDEX) 4.09 % Apply 1 application topically 2 (two) times daily as needed (irritation).     losartan (COZAAR) 100 MG tablet TAKE 1  TABLET BY MOUTH EVERY DAY (Patient taking differently: Take 100 mg by mouth daily.) 30 tablet 2   Multiple Vitamins-Minerals (ICAPS AREDS 2 PO) Take 1 capsule by mouth in the morning and at bedtime.     Multiple Vitamins-Minerals (MULTIVITAMIN WITH MINERALS) tablet Take 1 tablet by mouth daily.     rOPINIRole (REQUIP) 0.5 MG tablet TAKE 1 TABLET BY MOUTH AT BEDTIME. 90 tablet 1   SYNTHROID 150 MCG tablet Take 150 mcg by mouth daily before breakfast.     senna-docusate (SENOKOT-S) 8.6-50 MG tablet Take 2 tablets by mouth daily.     No facility-administered medications prior to visit.    PAST MEDICAL HISTORY: Past Medical History:  Diagnosis Date   Anxiety    Depression    HTN (hypertension)    resolved with weight loss s/p gastric bypass   Hypothyroidism    Since at least 2007   Osteoporosis    RLS (restless legs syndrome)    Sleep apnea    Stress fracture of left shoulder    Stroke [facility])    2013/February 2017    PAST SURGICAL HISTORY: Past Surgical History:  Procedure Laterality Date   BREAST BIOPSY Right    benign   CATARACT EXTRACTION  2018   CHOLECYSTECTOMY     COLONOSCOPY  2004  internal hemorrhoids, left-sided diverticulae, splenic flexure polyp (108m), path unavailable at this time   COLONOSCOPY  04/2011   R[facility]: Internal and external hemorrhoids, rectal tubular adenoma removed, left-sided diverticulosis   COLONOSCOPY N/A 06/01/2016   Procedure: COLONOSCOPY;  Surgeon: RDaneil Dolin MD;  Location[facility];  Service: Endoscopy;  Laterality: N/A;  0830   ESOPHAGOGASTRODUODENOSCOPY  2004   small hiatal hernia   GASTRIC ROUX-EN-Y  2006   REVERSE SHOULDER ARTHROPLASTY Left 09/29/2021   Procedure: LEFT REVERSE SHOULDER ARTHROPLASTY;  Surgeon: CMordecai Rasmussen MD;  Location: [facility] ORS;  Service: Orthopedics;  Laterality: Left;    FAMILY HISTORY: Family History  Problem Relation Age of Onset   Lung cancer Father    Stroke Father    Stroke Mother    Stroke Brother     Hypertension Brother    Stroke Maternal Grandmother    Stroke Maternal Grandfather    Cancer Other        family history    Coronary artery disease Other        family history    Arthritis Other        family history    Cancer Daughter     SOCIAL HISTORY: Social History   Socioeconomic History   Marital status: Divorced    Spouse name: Not on file   Number of children: 1   Years of education: college   Highest education level: Not on file  Occupational History   Occupation: retired     EF[employer] farm manager RETIRED  Tobacco Use   Smoking status: Former    Types: Cigarettes    Quit date: 11/28/1987    Years since quitting: 34.4   Smokeless tobacco: Never   Tobacco comments:    Quit x 25 plus years  Vaping Use   Vaping Use: Never used  Substance and Sexual Activity   Alcohol use: No    Alcohol/week: 0.0 standard drinks of alcohol   Drug use: No   Sexual activity: Not Currently  Other Topics Concern   Not on file  Social History Narrative   Caffeine 6 cups daily.  Retired/[employer],   2 yrs college,  Divorced, one daughter.   Social Determinants of Health   Financial Resource Strain: Not on file  Food Insecurity: Not on file  Transportation Needs: Not on file  Physical Activity: Not on file  Stress: Not on file  Social Connections: Not on file  Intimate Partner Violence: Not on file      PHYSICAL EXAM  Vitals:   05/17/22 1513  BP: 131/73  Pulse: 74  Weight: 189 lb (85.7 kg)  Height: '5\' 3"'$  (1.6 m)   Body mass index is 33.48 kg/m.  Generalized: Well developed, in no acute distress  Chest: Lungs clear to auscultation bilaterally  Neurological examination  Mentation: Alert oriented to time, place, history taking. Follows all commands speech and language fluent Cranial nerve II-XII: Extraocular movements were full, visual field were full on confrontational test Head turning and shoulder shrug  were normal and symmetric. Motor: The motor testing reveals 5 over 5  strength of all 4 extremities. Good symmetric motor tone is noted throughout.  Sensory: Sensory testing is intact to soft touch on all 4 extremities. No evidence of extinction is noted.  Gait and station: Gait is normal.    DIAGNOSTIC DATA (LABS, IMAGING, TESTING) - I reviewed patient records, labs, notes, testing and imaging myself where available.  Lab Results  Component Value Date  WBC 6.7 09/27/2021   HGB 12.1 09/27/2021   HCT 38.5 09/27/2021   MCV 90.2 09/27/2021   PLT 304 09/27/2021      Component Value Date/Time   NA 142 11/01/2021 1528   K 4.5 11/01/2021 1528   CL 105 11/01/2021 1528   CO2 22 11/01/2021 1528   GLUCOSE 81 11/01/2021 1528   GLUCOSE 85 09/27/2021 1359   BUN 16 11/01/2021 1528   CREATININE 1.04 (H) 11/01/2021 1528   CALCIUM 9.4 11/01/2021 1528   PROT 6.4 11/01/2021 1528   ALBUMIN 3.8 11/01/2021 1528   AST 19 11/01/2021 1528   ALT 15 11/01/2021 1528   ALKPHOS 262 (H) 11/01/2021 1528   BILITOT 0.2 11/01/2021 1528   GFRNONAA 55 (L) 09/27/2021 1359   GFRAA >60 09/03/2018 1138   Lab Results  Component Value Date   CHOL 184 01/05/2016   HDL 61 01/05/2016   LDLCALC 110 (H) 01/05/2016   TRIG 65 01/05/2016   CHOLHDL 3.0 01/05/2016   Lab Results  Component Value Date   HGBA1C 5.7 (H) 01/05/2016   No results found for: "VITAMINB12" Lab Results  Component Value Date   TSH 1.770 11/01/2021      ASSESSMENT AND PLAN 76 y.o. year old female  has a past medical history of Anxiety, Depression, HTN (hypertension), Hypothyroidism, Osteoporosis, RLS (restless legs syndrome), Sleep apnea, Stress fracture of left shoulder, and Stroke ([place]). here with:  OSA on CPAP  - CPAP compliance excellent - Good treatment of AHI  - Encourage patient to use CPAP nightly and > 4 hours each night -Discussed good sleep hygiene - F/U in 1 year or sooner if needed    Ward Givens, MSN, NP-C 05/17/2022, 3:24 PM [facility] [address] [place], [place] [address] [phone]

## 2022-05-17 NOTE — Patient Instructions (Signed)
Continue using CPAP nightly and greater than 4 hours each night °If your symptoms worsen or you develop new symptoms please let us know.  ° °

## 2022-05-24 NOTE — Progress Notes (Signed)
Denyse Amass, RN; [place], Samuella Cota so I had to call [employer] to get them to release the device! I am showing all of her DATA now. Yall are tagged.      Previous Messages    ----- Message -----  From: Brandon Melnick, RN  Sent: 05/17/2022   4:55 PM EDT  To: Vanessa Ralphs; Marchelle Gearing   Good afternoon  Megan NP wanted me to check and see if you could see what the issue with pts machine her wifi access, (no antennae)   She has a Melton Krebs 11 (should be remote), had to download with sd card today.  Let me know   She can get on her phone.     Virginia [name] "Manus Gunning"  76 year old female  1946-11-10  Comm Pref:   Acupuncturist

## 2022-05-26 NOTE — Telephone Encounter (Signed)
Heart monitor showed asymptomatic SVT.  Ok to schedule colonoscopy. [facility] clinical should already have orders.

## 2022-05-31 NOTE — Telephone Encounter (Signed)
Spoke with pt. She is scheduled for 8/10 at 10:15am. She is going to come and pick up the linzess samples with instructions/pre-op. She already has prep at home.

## 2022-06-01 ENCOUNTER — Encounter: Payer: Self-pay | Admitting: *Deleted

## 2022-06-05 DIAGNOSIS — N3090 Cystitis, unspecified without hematuria: Secondary | ICD-10-CM | POA: Diagnosis not present

## 2022-06-05 DIAGNOSIS — L509 Urticaria, unspecified: Secondary | ICD-10-CM | POA: Diagnosis not present

## 2022-06-05 DIAGNOSIS — F419 Anxiety disorder, unspecified: Secondary | ICD-10-CM | POA: Diagnosis not present

## 2022-06-05 DIAGNOSIS — E063 Autoimmune thyroiditis: Secondary | ICD-10-CM | POA: Diagnosis not present

## 2022-06-05 DIAGNOSIS — I10 Essential (primary) hypertension: Secondary | ICD-10-CM | POA: Diagnosis not present

## 2022-06-05 DIAGNOSIS — E6609 Other obesity due to excess calories: Secondary | ICD-10-CM | POA: Diagnosis not present

## 2022-06-05 DIAGNOSIS — N390 Urinary tract infection, site not specified: Secondary | ICD-10-CM | POA: Diagnosis not present

## 2022-06-05 DIAGNOSIS — R5383 Other fatigue: Secondary | ICD-10-CM | POA: Diagnosis not present

## 2022-06-05 DIAGNOSIS — Z6831 Body mass index (BMI) 31.0-31.9, adult: Secondary | ICD-10-CM | POA: Diagnosis not present

## 2022-06-07 ENCOUNTER — Other Ambulatory Visit (HOSPITAL_COMMUNITY): Payer: Self-pay | Admitting: Internal Medicine

## 2022-06-07 ENCOUNTER — Other Ambulatory Visit: Payer: Self-pay | Admitting: Internal Medicine

## 2022-06-07 DIAGNOSIS — E2749 Other adrenocortical insufficiency: Secondary | ICD-10-CM | POA: Diagnosis not present

## 2022-06-07 DIAGNOSIS — R748 Abnormal levels of other serum enzymes: Secondary | ICD-10-CM

## 2022-06-15 ENCOUNTER — Ambulatory Visit (HOSPITAL_COMMUNITY)
Admission: RE | Admit: 2022-06-15 | Discharge: 2022-06-15 | Disposition: A | Discharge status: Home / Self Care | Payer: Medicare Other | Source: Ambulatory Visit | Attending: Internal Medicine | Admitting: Internal Medicine

## 2022-06-15 DIAGNOSIS — R748 Abnormal levels of other serum enzymes: Secondary | ICD-10-CM | POA: Diagnosis not present

## 2022-06-15 DIAGNOSIS — R945 Abnormal results of liver function studies: Secondary | ICD-10-CM | POA: Diagnosis not present

## 2022-06-27 ENCOUNTER — Encounter: Payer: Self-pay | Admitting: *Deleted

## 2022-07-04 ENCOUNTER — Encounter (HOSPITAL_COMMUNITY): Payer: Self-pay

## 2022-07-04 ENCOUNTER — Encounter (HOSPITAL_COMMUNITY)
Admission: RE | Admit: 2022-07-04 | Discharge: 2022-07-04 | Disposition: A | Discharge status: Home / Self Care | Payer: Medicare Other | Source: Ambulatory Visit | Attending: Internal Medicine | Admitting: Internal Medicine

## 2022-07-04 ENCOUNTER — Other Ambulatory Visit: Payer: Self-pay

## 2022-07-06 ENCOUNTER — Ambulatory Visit (HOSPITAL_COMMUNITY)
Admission: RE | Admit: 2022-07-06 | Discharge: 2022-07-06 | Disposition: A | Discharge status: Home / Self Care | Payer: Medicare Other | Source: Ambulatory Visit | Attending: Internal Medicine | Admitting: Internal Medicine

## 2022-07-06 ENCOUNTER — Ambulatory Visit (HOSPITAL_COMMUNITY): Payer: Medicare Other | Admitting: Anesthesiology

## 2022-07-06 ENCOUNTER — Ambulatory Visit (HOSPITAL_BASED_OUTPATIENT_CLINIC_OR_DEPARTMENT_OTHER): Payer: Medicare Other | Admitting: Anesthesiology

## 2022-07-06 ENCOUNTER — Encounter (HOSPITAL_COMMUNITY)
Admission: RE | Disposition: A | Discharge status: Home / Self Care | Payer: Self-pay | Source: Ambulatory Visit | Attending: Internal Medicine

## 2022-07-06 DIAGNOSIS — I10 Essential (primary) hypertension: Secondary | ICD-10-CM | POA: Diagnosis not present

## 2022-07-06 DIAGNOSIS — K5730 Diverticulosis of large intestine without perforation or abscess without bleeding: Secondary | ICD-10-CM | POA: Insufficient documentation

## 2022-07-06 DIAGNOSIS — E039 Hypothyroidism, unspecified: Secondary | ICD-10-CM | POA: Diagnosis not present

## 2022-07-06 DIAGNOSIS — Z1211 Encounter for screening for malignant neoplasm of colon: Secondary | ICD-10-CM | POA: Diagnosis not present

## 2022-07-06 DIAGNOSIS — Z87891 Personal history of nicotine dependence: Secondary | ICD-10-CM

## 2022-07-06 DIAGNOSIS — Z86010 Personal history of colonic polyps: Secondary | ICD-10-CM

## 2022-07-06 DIAGNOSIS — Z9884 Bariatric surgery status: Secondary | ICD-10-CM | POA: Insufficient documentation

## 2022-07-06 DIAGNOSIS — Z09 Encounter for follow-up examination after completed treatment for conditions other than malignant neoplasm: Secondary | ICD-10-CM

## 2022-07-06 DIAGNOSIS — G4730 Sleep apnea, unspecified: Secondary | ICD-10-CM | POA: Insufficient documentation

## 2022-07-06 HISTORY — PX: COLONOSCOPY WITH PROPOFOL: SHX5780

## 2022-07-06 SURGERY — COLONOSCOPY WITH PROPOFOL
Anesthesia: General

## 2022-07-06 MED ORDER — PROPOFOL 500 MG/50ML IV EMUL
INTRAVENOUS | Status: DC | PRN
  Administered 2022-07-06: 150 ug/kg/min via INTRAVENOUS

## 2022-07-06 MED ORDER — PHENYLEPHRINE 80 MCG/ML (10ML) SYRINGE FOR IV PUSH (FOR BLOOD PRESSURE SUPPORT)
PREFILLED_SYRINGE | INTRAVENOUS | Status: AC
  Filled 2022-07-06: qty 10

## 2022-07-06 MED ORDER — PROPOFOL 10 MG/ML IV BOLUS
INTRAVENOUS | Status: DC | PRN
  Administered 2022-07-06: 80 mg via INTRAVENOUS
  Administered 2022-07-06: 30 mg via INTRAVENOUS

## 2022-07-06 MED ORDER — LIDOCAINE HCL (CARDIAC) PF 100 MG/5ML IV SOSY
PREFILLED_SYRINGE | INTRAVENOUS | Status: DC | PRN
  Administered 2022-07-06: 60 mg via INTRATRACHEAL

## 2022-07-06 MED ORDER — LACTATED RINGERS IV SOLN: INTRAVENOUS | Status: DC | PRN

## 2022-07-06 MED ORDER — LACTATED RINGERS IV SOLN: INTRAVENOUS | Status: DC

## 2022-07-06 NOTE — Anesthesia Postprocedure Evaluation (Signed)
Anesthesia Post Note  Patient: Virginia [name]  Procedure(s) Performed: COLONOSCOPY WITH PROPOFOL  Patient location during evaluation: Phase II Anesthesia Type: General Level of consciousness: awake and alert Pain management: pain level controlled Vital Signs Assessment: post-procedure vital signs reviewed and stable Respiratory status: spontaneous breathing, nonlabored ventilation and respiratory function stable Cardiovascular status: blood pressure returned to baseline and stable Postop Assessment: no apparent nausea or vomiting Anesthetic complications: no   No notable events documented.   Last Vitals:  Vitals:   07/06/22 0900 07/06/22 1044  BP: 127/63 (!) 95/55  Pulse: 69 80  Resp: 16 18  Temp: 36.4 C 36.4 C  SpO2: 99% 98%    Last Pain:  Vitals:   07/06/22 1044  TempSrc: Oral  PainSc: 0-No pain                 Kristianna Saperstein C Loukisha Gunnerson

## 2022-07-06 NOTE — H&P (Signed)
$'@LOGO'N$ @   Primary Care Physician:  Redmond School, MD Primary Gastroenterologist:  Dr. Gala Romney  Pre-Procedure History & Physical: HPI:  Virginia [name] is a 76 y.o. female here for surveillance colonoscopy distant history of colonic adenoma; negative colonoscopy 2017.  Past Medical History:  Diagnosis Date   Anxiety    Depression    HTN (hypertension)    resolved with weight loss s/p gastric bypass   Hypothyroidism    Since at least 2007   Osteoporosis    RLS (restless legs syndrome)    Sleep apnea    Stress fracture of left shoulder    Stroke [facility])    2013/February 2017    Past Surgical History:  Procedure Laterality Date   BREAST BIOPSY Right    benign   CATARACT EXTRACTION Bilateral 2018   CHOLECYSTECTOMY     COLONOSCOPY  2004   internal hemorrhoids, left-sided diverticulae, splenic flexure polyp (76m), path unavailable at this time   COLONOSCOPY  04/2011   RMR: Internal and external hemorrhoids, rectal tubular adenoma removed, left-sided diverticulosis   COLONOSCOPY N/A 06/01/2016   Procedure: COLONOSCOPY;  Surgeon: RDaneil Dolin MD;  Location: [facility] SUITE;  Service: Endoscopy;  Laterality: N/A;  0830   ESOPHAGOGASTRODUODENOSCOPY  2004   small hiatal hernia   GASTRIC ROUX-EN-Y  2006   REVERSE SHOULDER ARTHROPLASTY Left 09/29/2021   Procedure: LEFT REVERSE SHOULDER ARTHROPLASTY;  Surgeon: CMordecai Rasmussen MD;  Location: [facility] ORS;  Service: Orthopedics;  Laterality: Left;    Prior to Admission medications   Medication Sig Start Date End Date Taking? Authorizing Provider  ALPRAZolam (Duanne Moron 0.5 MG tablet Take 0.5 mg by mouth See admin instructions. Take 0.5 mg at bedtime, may take 0.5 mg dose 2 times daily as needed for anxiety 03/27/11  Yes [provider]  amLODipine (NORVASC) 2.5 MG tablet Take 2.5 mg by mouth daily.   Yes [provider]  Ascorbic Acid (VITAMIN C) 1000 MG tablet Take 1,000 mg by mouth daily.   Yes [provider]   aspirin EC 81 MG tablet Take 81 mg by mouth daily.   Yes [provider]  atorvastatin (LIPITOR) 10 MG tablet Take 1 tablet (10 mg total) by mouth daily. Patient taking differently: Take 10 mg by mouth every evening. 01/22/18  Yes Dohmeier, CAsencion Partridge MD  B Complex-C (SUPER B COMPLEX PO) Take 1 tablet by mouth daily.   Yes [provider]  Cholecalciferol (VITAMIN D) 50 MCG (2000 UT) tablet Take 2,000 Units by mouth daily.   Yes [provider]  diphenhydrAMINE (BENADRYL) 12.5 MG/5ML elixir Take 12.5-25 mg by mouth 4 (four) times daily as needed for allergies.   Yes [provider]  diphenhydrAMINE (BENADRYL) 25 MG tablet Take 25 mg by mouth daily as needed for allergies.   Yes [provider]  DULoxetine (CYMBALTA) 60 MG capsule Take 60 mg by mouth daily.   Yes [provider]  losartan (COZAAR) 100 MG tablet TAKE 1 TABLET BY MOUTH EVERY DAY 08/05/19  Yes KHerminio Commons MD  Multiple Vitamins-Minerals (ICAPS AREDS 2 PO) Take 1 capsule by mouth in the morning and at bedtime.   Yes [provider]  Multiple Vitamins-Minerals (MULTIVITAMIN WITH MINERALS) tablet Take 1 tablet by mouth daily.   Yes [provider]  rOPINIRole (REQUIP) 0.5 MG tablet TAKE 1 TABLET BY MOUTH AT BEDTIME. 05/16/22  Yes Dohmeier, CAsencion Partridge MD  SYNTHROID 150 MCG tablet Take 150 mcg by mouth daily before breakfast. 07/17/21  Yes [provider]  fluocinonide ointment (LIDEX) 3.71 % Apply 1 application topically 2 (two) times daily as needed (irritation).    [provider]    Allergies as of 05/31/2022 - Review Complete 05/17/2022  Allergen Reaction Noted   Augmentin [amoxicillin-pot clavulanate] Anaphylaxis 09/27/2021    Family History  Problem Relation Age of Onset   Lung cancer Father    Stroke Father    Stroke Mother    Stroke Brother    Hypertension Brother    Stroke Maternal Grandmother    Stroke Maternal Grandfather     Cancer Other        family history    Coronary artery disease Other        family history    Arthritis Other        family history    Cancer Daughter     Social History   Socioeconomic History   Marital status: Divorced    Spouse name: Not on file   Number of children: 1   Years of education: college   Highest education level: Not on file  Occupational History   Occupation: retired     Fish farm manager: RETIRED  Tobacco Use   Smoking status: Former    Packs/day: 1.50    Years: 20.00    Total pack years: 30.00    Types: Cigarettes    Quit date: 11/28/1987    Years since quitting: 34.6   Smokeless tobacco: Never   Tobacco comments:    Quit x 25 plus years  Vaping Use   Vaping Use: Never used  Substance and Sexual Activity   Alcohol use: No    Alcohol/week: 0.0 standard drinks of alcohol   Drug use: No   Sexual activity: Not Currently  Other Topics Concern   Not on file  Social History Narrative   Caffeine 6 cups daily.  Retired/[employer],   2 yrs college,  Divorced, one daughter.   Social Determinants of Health   Financial Resource Strain: Not on file  Food Insecurity: Not on file  Transportation Needs: Not on file  Physical Activity: Not on file  Stress: Not on file  Social Connections: Not on file  Intimate Partner Violence: Not on file    Review of Systems: See HPI, otherwise negative ROS  Physical Exam: BP 127/63 (BP Location: Left Arm)   Pulse 69   Temp 97.6 F (36.4 C)   Resp 16   SpO2 99%  General:   Alert,  Well-developed, well-nourished, pleasant and cooperative in NAD Neck:  Supple; no masses or thyromegaly. No significant cervical adenopathy. Lungs:  Clear throughout to auscultation.   No wheezes, crackles, or rhonchi. No acute distress. Heart:  Regular rate and rhythm; no murmurs, clicks, rubs,  or gallops. Abdomen: Non-distended, normal bowel sounds.  Soft and nontender without appreciable mass or hepatosplenomegaly.  Pulses:  Normal pulses  noted. Extremities:  Without clubbing or edema.  Impression/Plan: 76 year old lady here for surveillance colonoscopy.  History of colonic adenomas.  No GI symptoms at this time.  I will offer the patient a surveillance colonoscopy today per plan The risks, benefits, limitations, alternatives and imponderables have been reviewed with the patient. Questions have been answered. All parties are agreeable.      Notice: This dictation was prepared with Dragon dictation along with smaller phrase technology. Any transcriptional errors that result from this process are unintentional and may not be corrected upon review.

## 2022-07-06 NOTE — Op Note (Signed)
[facility] Patient Name: Virginia [name] Procedure Date: 07/06/2022 10:12 AM MRN: [id number] Date of Birth: [birth year] Attending MD: Norvel Richards , MD CSN: [id number] Age: 76 Admit Type: Outpatient Procedure:                Colonoscopy Indications:              High risk colon cancer surveillance: Personal                            history of colonic polyps Providers:                Norvel Richards, MD, Lambert Mody,                            Raphael Gibney, Technician Referring MD:              Medicines:                Propofol per Anesthesia Complications:            No immediate complications. Estimated Blood Loss:     Estimated blood loss: none. Estimated blood loss:                            none. Procedure:                Pre-Anesthesia Assessment:                           - Prior to the procedure, a History and Physical                            was performed, and patient medications and                            allergies were reviewed. [name] tolerance of                            previous anesthesia was also reviewed. The risks                            and benefits of the procedure and the sedation                            options and risks were discussed with the patient.                            All questions were answered, and informed consent                            was obtained. Prior Anticoagulants: The patient has                            taken no previous anticoagulant or antiplatelet                            agents. ASA Grade Assessment: III -  A patient with                            severe systemic disease. After reviewing the risks                            and benefits, the patient was deemed in                            satisfactory condition to undergo the procedure.                           After obtaining informed consent, the colonoscope                            was passed under direct vision.  Throughout the                            procedure, the patient's blood pressure, pulse, and                            oxygen saturations were monitored continuously. The                            [id number]) scope was introduced through the                            anus and advanced to the the cecum, identified by                            appendiceal orifice and ileocecal valve. The                            colonoscopy was performed without difficulty. The                            ileocecal valve, appendiceal orifice, and rectum                            were photographed. The entire colon was well                            visualized. Scope In: 10:27:05 AM Scope Out: 10:39:37 AM Scope Withdrawal Time: 0 hours 5 minutes 56 seconds  Total Procedure Duration: 0 hours 12 minutes 32 seconds  Findings:      The perianal and digital rectal examinations were normal.      Multiple small and large-mouthed diverticula were found in the sigmoid       colon and descending colon.      The exam was otherwise without abnormality. Rectal vault small rectal       mucosa seen well on face. Too small to retroflex. Impression:               - Diverticulosis in the sigmoid colon and in the  descending colon.                           - The examination was otherwise normal.                           - No specimens collected. Moderate Sedation:      Moderate (conscious) sedation was personally administered by an       anesthesia professional. The following parameters were monitored: oxygen       saturation, heart rate, blood pressure, respiratory rate, EKG, adequacy       of pulmonary ventilation, and response to care. Recommendation:           - Patient has a contact number available for                            emergencies. The signs and symptoms of potential                            delayed complications were discussed with the                             patient. Return to normal activities tomorrow.                            Written discharge instructions were provided to the                            patient.                           - Resume previous diet.                           - Continue present medications.                           - No repeat colonoscopy due to age.                           - Return to GI clinic (date not yet determined). Procedure Code(s):        --- Professional ---                           [phone], Colonoscopy, flexible; diagnostic, including                            collection of specimen(s) by brushing or washing,                            when performed (separate procedure) Diagnosis Code(s):        --- Professional ---                           Z86.010, Personal history of colonic polyps  K57.30, Diverticulosis of large intestine without                            perforation or abscess without bleeding CPT copyright 2019 American Medical Association. All rights reserved. The codes documented in this report are preliminary and upon coder review may  be revised to meet current compliance requirements. Cristopher Estimable. Lanetta Figuero, MD Norvel Richards, MD 07/06/2022 10:53:39 AM This report has been signed electronically. Number of Addenda: 0

## 2022-07-06 NOTE — Anesthesia Preprocedure Evaluation (Signed)
Anesthesia Evaluation  Patient identified by MRN, date of birth, ID band Patient awake    Reviewed: Allergy & Precautions, NPO status , Patient's Chart, lab work & pertinent test results  Airway Mallampati: I  TM Distance: >3 FB Neck ROM: Full    Dental  (+) Dental Advisory Given, Teeth Intact   Pulmonary sleep apnea and Continuous Positive Airway Pressure Ventilation , former smoker,    Pulmonary exam normal breath sounds clear to auscultation       Cardiovascular Exercise Tolerance: Good hypertension, Pt. on medications Normal cardiovascular exam Rhythm:Regular Rate:Normal     Neuro/Psych PSYCHIATRIC DISORDERS Anxiety Depression  Neuromuscular disease (RLS) CVA, No Residual Symptoms    GI/Hepatic negative GI ROS, Neg liver ROS, Bowel prep,GASTRIC BYPASS    Endo/Other  Hypothyroidism   Renal/GU negative Renal ROS  negative genitourinary   Musculoskeletal  (+) Arthritis ,   Abdominal   Peds negative pediatric ROS (+)  Hematology  (+) Blood dyscrasia, anemia ,   Anesthesia Other Findings   Reproductive/Obstetrics negative OB ROS                            Anesthesia Physical Anesthesia Plan  ASA: 3  Anesthesia Plan: General   Post-op Pain Management: Minimal or no pain anticipated   Induction: Intravenous  PONV Risk Score and Plan: TIVA  Airway Management Planned: Nasal Cannula and Natural Airway  Additional Equipment:   Intra-op Plan:   Post-operative Plan:   Informed Consent: I have reviewed the patients History and Physical, chart, labs and discussed the procedure including the risks, benefits and alternatives for the proposed anesthesia with the patient or authorized representative who has indicated his/her understanding and acceptance.     Dental advisory given  Plan Discussed with: CRNA and Surgeon  Anesthesia Plan Comments:        Anesthesia Quick  Evaluation

## 2022-07-06 NOTE — Discharge Instructions (Signed)
  Colonoscopy Discharge Instructions  Read the instructions outlined below and refer to this sheet in the next few weeks. These discharge instructions provide you with general information on caring for yourself after you leave the hospital. Your doctor may also give you specific instructions. While your treatment has been planned according to the most current medical practices available, unavoidable complications occasionally occur. If you have any problems or questions after discharge, call Dr. Gala Romney at [phone]. ACTIVITY You may resume your regular activity, but move at a slower pace for the next 24 hours.  Take frequent rest periods for the next 24 hours.  Walking will help get rid of the air and reduce the bloated feeling in your belly (abdomen).  No driving for 24 hours (because of the medicine (anesthesia) used during the test).   Do not sign any important legal documents or operate any machinery for 24 hours (because of the anesthesia used during the test).  NUTRITION Drink plenty of fluids.  You may resume your normal diet as instructed by your doctor.  Begin with a light meal and progress to your normal diet. Heavy or fried foods are harder to digest and may make you feel sick to your stomach (nauseated).  Avoid alcoholic beverages for 24 hours or as instructed.  MEDICATIONS You may resume your normal medications unless your doctor tells you otherwise.  WHAT YOU CAN EXPECT TODAY Some feelings of bloating in the abdomen.  Passage of more gas than usual.  Spotting of blood in your stool or on the toilet paper.  IF YOU HAD POLYPS REMOVED DURING THE COLONOSCOPY: No aspirin products for 7 days or as instructed.  No alcohol for 7 days or as instructed.  Eat a soft diet for the next 24 hours.  FINDING OUT THE RESULTS OF YOUR TEST Not all test results are available during your visit. If your test results are not back during the visit, make an appointment with your caregiver to find out the  results. Do not assume everything is normal if you have not heard from your caregiver or the medical facility. It is important for you to follow up on all of your test results.  SEEK IMMEDIATE MEDICAL ATTENTION IF: You have more than a spotting of blood in your stool.  Your belly is swollen (abdominal distention).  You are nauseated or vomiting.  You have a temperature over 101.  You have abdominal pain or discomfort that is severe or gets worse throughout the day.     Diverticulosis only found today.  A future colonoscopy is not recommended unless new symptoms develop  I briefly looked at your labs in the computer.  It would be a good idea to draw 1 special blood test called an antimitochondrial antibody today.  Look forward to seeing you in the office.  At patient request, I called Vicenta Dunning at [phone] -reviewed findings.

## 2022-07-06 NOTE — Transfer of Care (Signed)
Immediate Anesthesia Transfer of Care Note  Patient: Virginia [name]  Procedure(s) Performed: COLONOSCOPY WITH PROPOFOL  Patient Location: Short Stay  Anesthesia Type:General  Level of Consciousness: sedated  Airway & Oxygen Therapy: Patient Spontanous Breathing  Post-op Assessment: Report given to RN and Post -op Vital signs reviewed and stable  Post vital signs: Reviewed and stable  Last Vitals:  Vitals Value Taken Time  BP 100/45   Temp 36   Pulse 72   Resp 16   SpO2 96     Last Pain:  Vitals:   07/06/22 1025  PainSc: 0-No pain         Complications: No notable events documented.

## 2022-07-07 LAB — MITOCHONDRIAL ANTIBODIES: Mitochondrial M2 Ab, IgG: <20 Units (ref 0.0–20.0)

## 2022-07-11 ENCOUNTER — Encounter (HOSPITAL_COMMUNITY): Payer: Self-pay | Admitting: Internal Medicine

## 2022-07-25 ENCOUNTER — Ambulatory Visit (INDEPENDENT_AMBULATORY_CARE_PROVIDER_SITE_OTHER): Payer: Medicare Other | Admitting: Internal Medicine

## 2022-07-25 ENCOUNTER — Encounter: Payer: Self-pay | Admitting: Internal Medicine

## 2022-07-25 VITALS — BP 115/64 | HR 85 | Temp 97.1°F | Ht 63.0 in | Wt 180.2 lb

## 2022-07-25 DIAGNOSIS — R945 Abnormal results of liver function studies: Secondary | ICD-10-CM

## 2022-07-25 DIAGNOSIS — R7989 Other specified abnormal findings of blood chemistry: Secondary | ICD-10-CM | POA: Diagnosis not present

## 2022-07-25 DIAGNOSIS — R7401 Elevation of levels of liver transaminase levels: Secondary | ICD-10-CM | POA: Diagnosis not present

## 2022-07-25 DIAGNOSIS — I639 Cerebral infarction, unspecified: Secondary | ICD-10-CM | POA: Diagnosis not present

## 2022-07-25 MED ORDER — CHOLESTYRAMINE 4 G PO PACK: 4.0000 g | PACK | Freq: Every day | ORAL | 11 refills | Status: DC

## 2022-07-25 NOTE — Patient Instructions (Signed)
Good to see you again today!  For your itching:  Begin Questran or cholestyramine 4 g packet daily.  Important not to take this medications within 2 hours before or after other medications (but dispense 30 doses with 11 refills)  For your liver:  Hepatic function profile, INR, CBC, serum IgG4 level and serum GGT Further diagnostic testing including MRI may be needed in the near future.  For vitamin deficiency:  Resume calcium supplement daily  You need to supplement to provide fat-soluble vitamin supplementation(vitamin D, E, A, K) -over-the-counter-take daily  Further recommendations to follow.

## 2022-07-25 NOTE — Progress Notes (Unsigned)
Primary Care Physician:  Redmond School, MD Primary Gastroenterologist:  Dr.   Pre-Procedure History & Physical: HPI:  Virginia [name] is a 76 y.o. female here for 10-year history of elevated alkaline phosphatase and more recently pruritus which is severe at times. I saw this lady at the hospital couple of weeks ago for surveillance colonoscopy (history of colon polyps).  She mentions she was coming to see me regarding her itching and elevated alkaline phosphatase.  I believe briefly reviewed the labs at that time.  I went ahead and drew a serum antimitochondrial antibody-came back negative.  In reviewing her labs.  She has elevated alkaline phosphatase noted going back to 2013 at that time, was only about 3 points above the upper limit of normal.  More recently, has climbed steadily into the 300 range.  Other hepatic function profile parameters completely normal. Recent oral ultrasound demonstrated coarse echogenic appearing liver.  Common bile duct 5 mm.  Gallbladder out. He has never been jaundiced.  There is no family history of chronic liver disease.  Does not consume alcohol.  Does not use herbal remedies.  She has a history of osteoporosis and does not take a calcium supplement although she takes vitamin D.  Recent stress fracture left shoulder for which she had to have surgery. Other medical illnesses include history of autoimmune thyroiditis. History of RYGB for weight loss several years ago; lost about 80 pounds.  Past Medical History:  Diagnosis Date   Anxiety    Depression    HTN (hypertension)    resolved with weight loss s/p gastric bypass   Hypothyroidism    Since at least 2007   Osteoporosis    RLS (restless legs syndrome)    Sleep apnea    Stress fracture of left shoulder    Stroke [facility])    2013/February 2017    Past Surgical History:  Procedure Laterality Date   BREAST BIOPSY Right    benign   CATARACT EXTRACTION Bilateral 2018   CHOLECYSTECTOMY      COLONOSCOPY  2004   internal hemorrhoids, left-sided diverticulae, splenic flexure polyp (46m), path unavailable at this time   COLONOSCOPY  04/2011   RMR: Internal and external hemorrhoids, rectal tubular adenoma removed, left-sided diverticulosis   COLONOSCOPY N/A 06/01/2016   Procedure: COLONOSCOPY;  Surgeon: RDaneil Dolin MD;  [facility];  Service: Endoscopy;  Laterality: N/A;  0830   COLONOSCOPY WITH PROPOFOL N/A 07/06/2022   Procedure: COLONOSCOPY WITH PROPOFOL;  Surgeon: RDaneil Dolin MD;  Location: [facility];  Service: Endoscopy;  Laterality: N/A;  10:15am   ESOPHAGOGASTRODUODENOSCOPY  2004   small hiatal hernia   GASTRIC ROUX-EN-Y  2006   REVERSE SHOULDER ARTHROPLASTY Left 09/29/2021   Procedure: LEFT REVERSE SHOULDER ARTHROPLASTY;  Surgeon: CMordecai Rasmussen MD;  Location: [facility] OR[facility];  Service: Orthopedics;  Laterality: Left;    Prior to Admission medications   Medication Sig Start Date End Date Taking? Authorizing Provider  ALPRAZolam (Duanne Moron 0.5 MG tablet Take 0.5 mg by mouth See admin instructions. Take 0.5 mg at bedtime, may take 0.5 mg dose 2 times daily as needed for anxiety 03/27/11  Yes [provider]  amLODipine (NORVASC) 2.5 MG tablet Take 2.5 mg by mouth daily.   Yes [provider]  Ascorbic Acid (VITAMIN C) 1000 MG tablet Take 1,000 mg by mouth daily.   Yes [provider]  aspirin EC 81 MG tablet Take 81 mg by mouth daily.  Yes [provider]  atorvastatin (LIPITOR) 10 MG tablet Take 1 tablet (10 mg total) by mouth daily. Patient taking differently: Take 10 mg by mouth every evening. 01/22/18  Yes Dohmeier, Asencion Partridge, MD  B Complex-C (SUPER B COMPLEX PO) Take 1 tablet by mouth daily.   Yes [provider]  Cholecalciferol (VITAMIN D) 50 MCG (2000 UT) tablet Take 2,000 Units by mouth daily.   Yes [provider]  diphenhydrAMINE (BENADRYL) 12.5 MG/5ML elixir Take 12.5-25 mg by mouth 4 (four) times daily  as needed for allergies.   Yes [provider]  diphenhydrAMINE (BENADRYL) 25 MG tablet Take 25 mg by mouth daily as needed for allergies.   Yes [provider]  DULoxetine (CYMBALTA) 60 MG capsule Take 60 mg by mouth daily.   Yes [provider]  fluocinonide ointment (LIDEX) 5.27 % Apply 1 application topically 2 (two) times daily as needed (irritation).   Yes [provider]  losartan (COZAAR) 100 MG tablet TAKE 1 TABLET BY MOUTH EVERY DAY 08/05/19  Yes Herminio Commons, MD  Multiple Vitamins-Minerals (ICAPS AREDS 2 PO) Take 1 capsule by mouth in the morning and at bedtime.   Yes [provider]  Multiple Vitamins-Minerals (MULTIVITAMIN WITH MINERALS) tablet Take 1 tablet by mouth daily.   Yes [provider]  rOPINIRole (REQUIP) 0.5 MG tablet TAKE 1 TABLET BY MOUTH AT BEDTIME. 05/16/22  Yes Dohmeier, Asencion Partridge, MD  SYNTHROID 150 MCG tablet Take 150 mcg by mouth daily before breakfast. 07/17/21  Yes [provider]    Allergies as of 07/25/2022 - Review Complete 07/25/2022  Allergen Reaction Noted   Augmentin [amoxicillin-pot clavulanate] Anaphylaxis 09/27/2021    Family History  Problem Relation Age of Onset   Lung cancer Father    Stroke Father    Stroke Mother    Stroke Brother    Hypertension Brother    Stroke Maternal Grandmother    Stroke Maternal Grandfather    Cancer Other        family history    Coronary artery disease Other        family history    Arthritis Other        family history    Cancer Daughter     Social History   Socioeconomic History   Marital status: Divorced    Spouse name: Not on file   Number of children: 1   Years of education: college   Highest education level: Not on file  Occupational History   Occupation: retired     Fish farm manager: RETIRED  Tobacco Use   Smoking status: Former    Packs/day: 1.50    Years: 20.00    Total pack years: 30.00    Types: Cigarettes    Quit date:  11/28/1987    Years since quitting: 34.6   Smokeless tobacco: Never   Tobacco comments:    Quit x 25 plus years  Vaping Use   Vaping Use: Never used  Substance and Sexual Activity   Alcohol use: No    Alcohol/week: 0.0 standard drinks of alcohol   Drug use: No   Sexual activity: Not Currently  Other Topics Concern   Not on file  Social History Narrative   Caffeine 6 cups daily.  Retired/[employer],   2 yrs college,  Divorced, one daughter.   Social Determinants of Health   Financial Resource Strain: Not on file  Food Insecurity: Not on file  Transportation Needs: Not on file  Physical Activity:  Not on file  Stress: Not on file  Social Connections: Not on file  Intimate Partner Violence: Not on file    Review of Systems: See HPI, otherwise negative ROS  Physical Exam: BP 115/64 (BP Location: Left Arm, Patient Position: Sitting, Cuff Size: Normal)   Pulse 85   Temp (!) 97.1 F (36.2 C) (Temporal)   Ht '5\' 3"'$  (1.6 m)   Wt 180 lb 3.2 oz (81.7 kg)   SpO2 93%   BMI 31.92 kg/m  General:   Alert,  Well-developed, well-nourished, pleasant and cooperative in NAD Skin: No stigmata of chronic liver disease. Eyes:  Sclera clear, no icterus.   Conjunctiva pink.  No xanthelasma Ears:  Normal auditory acuity. Lungs:  Clear throughout to auscultation.   No wheezes, crackles, or rhonchi. No acute distress. Heart:  Regular rate and rhythm; no murmurs, clicks, rubs,  or gallops. Abdomen: Non-distended, normal bowel sounds.  Soft and nontender without appreciable mass or hepatosplenomegaly.  Pulses:  Normal pulses noted. Extremities:  Without clubbing or edema.  Impression/Pla 76 year old lady presents with a 10-year history of progressively elevation in alkaline phosphatase. Recently significant pruritus.  Alkaline phosphatase down to 300 range.  Notably, her transaminases have always been normal as far as I can tell. AMA recently negative.  Is osteoporosis in part prior gastric bypass  surgery in probable cholestatic liver disease. A negative AMA makes primary biliary cholangitis less likely but is not absolutely excluded.  Very much remaining in the differential diagnosis is primary sclerosing cholangitis.  Ultrasound reveals nonspecific morphologic abnormalities of the liver.  I suspect alkaline phosphatase elevation is indeed coming from the liver/biliary tree. Brief discussion regarding these  potential entities with the patient.  Recommendations:   Begin Questran or cholestyramine 4 g packet daily.  Important not to take this medications within 2 hours before or after other medications (but dispense 30 doses with 11 refills)  For your liver:  Hepatic function profile, INR, CBC, serum IgG4 level and serum GGT Further diagnostic testing including MRI may be needed in the near future.  For vitamin deficiency:  Resume calcium supplement daily  You need to supplement to provide fat-soluble vitamin supplementation(vitamin D, E, A, K) -over-the-counter-take daily  Repeat DEXA scan in 1 to 2 years.  Office visit in 3 months  Further recommendations to follow.   Notice: This dictation was prepared with Dragon dictation along with smaller phrase technology. Any transcriptional errors that result from this process are unintentional and may not be corrected upon review.

## 2022-07-26 ENCOUNTER — Telehealth: Payer: Self-pay

## 2022-07-26 DIAGNOSIS — R7989 Other specified abnormal findings of blood chemistry: Secondary | ICD-10-CM

## 2022-07-26 DIAGNOSIS — R748 Abnormal levels of other serum enzymes: Secondary | ICD-10-CM

## 2022-07-26 LAB — HEPATIC FUNCTION PANEL
ALT: 33 IU/L — ABNORMAL HIGH (ref 0–32)
AST: 30 IU/L (ref 0–40)
Albumin: 4.3 g/dL (ref 3.8–4.8)
Alkaline Phosphatase: 200 IU/L — ABNORMAL HIGH (ref 44–121)
Bilirubin Total: 0.4 mg/dL (ref 0.0–1.2)
Bilirubin, Direct: 0.14 mg/dL (ref 0.00–0.40)
Total Protein: 6.8 g/dL (ref 6.0–8.5)

## 2022-07-26 LAB — CBC WITH DIFFERENTIAL/PLATELET
Basophils Absolute: 0.1 10*3/uL (ref 0.0–0.2)
Basos: 1 %
EOS (ABSOLUTE): 0.6 10*3/uL — ABNORMAL HIGH (ref 0.0–0.4)
Eos: 8 %
Hematocrit: 35.5 % (ref 34.0–46.6)
Hemoglobin: 11.6 g/dL (ref 11.1–15.9)
Immature Grans (Abs): 0 10*3/uL (ref 0.0–0.1)
Immature Granulocytes: 0 %
Lymphocytes Absolute: 1.8 10*3/uL (ref 0.7–3.1)
Lymphs: 24 %
MCH: 28 pg (ref 26.6–33.0)
MCHC: 32.7 g/dL (ref 31.5–35.7)
MCV: 86 fL (ref 79–97)
Monocytes Absolute: 0.9 10*3/uL (ref 0.1–0.9)
Monocytes: 12 %
Neutrophils Absolute: 4.3 10*3/uL (ref 1.4–7.0)
Neutrophils: 55 %
Platelets: 307 10*3/uL (ref 150–450)
RBC: 4.15 x10E6/uL (ref 3.77–5.28)
RDW: 13.4 % (ref 11.7–15.4)
WBC: 7.7 10*3/uL (ref 3.4–10.8)

## 2022-07-26 LAB — PROTIME-INR
INR: 1 (ref 0.9–1.2)
Prothrombin Time: 11.3 s (ref 9.1–12.0)

## 2022-07-26 LAB — GAMMA GT: GGT: 10 IU/L (ref 0–60)

## 2022-07-26 LAB — IGG 4: IgG, Subclass 4: 25 mg/dL (ref 2–96)

## 2022-07-26 NOTE — Telephone Encounter (Signed)
-----   Message from Daneil Dolin, MD sent at 07/26/2022 10:57 AM EDT ----- Arloa Koh find out whether or not patient's left shoulder prosthesis is MRI compatible or not.  Please schedule an MRCP to look at her bile ducts elevated alkaline phosphatase and itching.

## 2022-07-26 NOTE — Addendum Note (Signed)
Addended by: Cheron Every on: 07/26/2022 11:17 AM   Modules accepted: Orders

## 2022-07-26 NOTE — Telephone Encounter (Signed)
Spoke with Virginia [name] in MRI. He stated it was fine for pt to have MRCP done. Just confirm with pt no other implanted devices, pacemaker, pain pump, etc.  Called pt and confirmed she does not.   Called pt back and made aware of MRCP appt details 9/15, arrival 7:30am, npo midnight.

## 2022-07-28 DIAGNOSIS — N3090 Cystitis, unspecified without hematuria: Secondary | ICD-10-CM | POA: Diagnosis not present

## 2022-08-01 ENCOUNTER — Telehealth: Payer: Self-pay

## 2022-08-01 NOTE — Telephone Encounter (Signed)
Pt was made aware and verbalized understanding.  

## 2022-08-01 NOTE — Telephone Encounter (Signed)
Pt called stating that Dr. Gerarda Fraction put her on Macrobid 100 mg every 12 hrs for a UTI and is wanting to know if it is ok for her to take with everything that has been going on with her. Pt states that it stated not to take if you have liver problems. Please advise.

## 2022-08-11 ENCOUNTER — Other Ambulatory Visit (HOSPITAL_COMMUNITY): Payer: Medicare Other

## 2022-08-14 ENCOUNTER — Other Ambulatory Visit: Payer: Self-pay | Admitting: Internal Medicine

## 2022-08-14 ENCOUNTER — Ambulatory Visit (HOSPITAL_COMMUNITY)
Admission: RE | Admit: 2022-08-14 | Discharge: 2022-08-14 | Disposition: A | Discharge status: Home / Self Care | Payer: Medicare Other | Source: Ambulatory Visit | Attending: Internal Medicine | Admitting: Internal Medicine

## 2022-08-14 DIAGNOSIS — R748 Abnormal levels of other serum enzymes: Secondary | ICD-10-CM

## 2022-08-14 DIAGNOSIS — R7989 Other specified abnormal findings of blood chemistry: Secondary | ICD-10-CM | POA: Insufficient documentation

## 2022-08-14 DIAGNOSIS — R109 Unspecified abdominal pain: Secondary | ICD-10-CM | POA: Diagnosis not present

## 2022-08-14 DIAGNOSIS — G8929 Other chronic pain: Secondary | ICD-10-CM | POA: Diagnosis not present

## 2022-08-14 DIAGNOSIS — L299 Pruritus, unspecified: Secondary | ICD-10-CM | POA: Diagnosis not present

## 2022-08-14 MED ORDER — GADOPICLENOL 0.5 MMOL/ML IV SOLN
8.0000 mL | Freq: Once | INTRAVENOUS | Status: AC | PRN
  Administered 2022-08-14: 8 mL via INTRAVENOUS

## 2022-08-16 ENCOUNTER — Ambulatory Visit: Payer: Medicare Other | Admitting: Allergy & Immunology

## 2022-08-17 ENCOUNTER — Other Ambulatory Visit: Payer: Self-pay | Admitting: *Deleted

## 2022-08-17 DIAGNOSIS — R748 Abnormal levels of other serum enzymes: Secondary | ICD-10-CM

## 2022-08-17 DIAGNOSIS — R7989 Other specified abnormal findings of blood chemistry: Secondary | ICD-10-CM

## 2022-08-22 ENCOUNTER — Other Ambulatory Visit (HOSPITAL_COMMUNITY): Payer: Self-pay | Admitting: Internal Medicine

## 2022-08-22 DIAGNOSIS — Z1231 Encounter for screening mammogram for malignant neoplasm of breast: Secondary | ICD-10-CM

## 2022-08-23 ENCOUNTER — Other Ambulatory Visit (HOSPITAL_COMMUNITY): Payer: Self-pay | Admitting: Physician Assistant

## 2022-08-23 DIAGNOSIS — Z01818 Encounter for other preprocedural examination: Secondary | ICD-10-CM

## 2022-08-24 ENCOUNTER — Ambulatory Visit (HOSPITAL_COMMUNITY)
Admission: RE | Admit: 2022-08-24 | Discharge: 2022-08-24 | Disposition: A | Discharge status: Home / Self Care | Payer: Medicare Other | Source: Ambulatory Visit | Attending: Internal Medicine | Admitting: Internal Medicine

## 2022-08-24 ENCOUNTER — Other Ambulatory Visit: Payer: Self-pay | Admitting: Radiology

## 2022-08-24 DIAGNOSIS — R748 Abnormal levels of other serum enzymes: Secondary | ICD-10-CM | POA: Insufficient documentation

## 2022-08-24 DIAGNOSIS — F32A Depression, unspecified: Secondary | ICD-10-CM | POA: Diagnosis not present

## 2022-08-24 DIAGNOSIS — Z1231 Encounter for screening mammogram for malignant neoplasm of breast: Secondary | ICD-10-CM | POA: Insufficient documentation

## 2022-08-24 DIAGNOSIS — Z01818 Encounter for other preprocedural examination: Secondary | ICD-10-CM | POA: Insufficient documentation

## 2022-08-24 DIAGNOSIS — G2581 Restless legs syndrome: Secondary | ICD-10-CM | POA: Insufficient documentation

## 2022-08-24 DIAGNOSIS — Z7982 Long term (current) use of aspirin: Secondary | ICD-10-CM | POA: Insufficient documentation

## 2022-08-24 DIAGNOSIS — F419 Anxiety disorder, unspecified: Secondary | ICD-10-CM | POA: Diagnosis not present

## 2022-08-24 DIAGNOSIS — Z9884 Bariatric surgery status: Secondary | ICD-10-CM | POA: Diagnosis not present

## 2022-08-24 DIAGNOSIS — I10 Essential (primary) hypertension: Secondary | ICD-10-CM | POA: Insufficient documentation

## 2022-08-24 DIAGNOSIS — R7989 Other specified abnormal findings of blood chemistry: Secondary | ICD-10-CM | POA: Diagnosis not present

## 2022-08-25 ENCOUNTER — Ambulatory Visit (HOSPITAL_COMMUNITY)
Admission: RE | Admit: 2022-08-25 | Discharge: 2022-08-25 | Disposition: A | Discharge status: Home / Self Care | Payer: Medicare Other | Source: Ambulatory Visit | Attending: Internal Medicine | Admitting: Internal Medicine

## 2022-08-25 ENCOUNTER — Other Ambulatory Visit: Payer: Self-pay

## 2022-08-25 ENCOUNTER — Encounter (HOSPITAL_COMMUNITY): Payer: Self-pay

## 2022-08-25 DIAGNOSIS — R7989 Other specified abnormal findings of blood chemistry: Secondary | ICD-10-CM | POA: Diagnosis not present

## 2022-08-25 DIAGNOSIS — K760 Fatty (change of) liver, not elsewhere classified: Secondary | ICD-10-CM | POA: Diagnosis not present

## 2022-08-25 DIAGNOSIS — R748 Abnormal levels of other serum enzymes: Secondary | ICD-10-CM

## 2022-08-25 DIAGNOSIS — Z01818 Encounter for other preprocedural examination: Secondary | ICD-10-CM

## 2022-08-25 LAB — CBC
HCT: 37.9 % (ref 36.0–46.0)
Hemoglobin: 11.6 g/dL — ABNORMAL LOW (ref 12.0–15.0)
MCH: 27.9 pg (ref 26.0–34.0)
MCHC: 30.6 g/dL (ref 30.0–36.0)
MCV: 91.1 fL (ref 80.0–100.0)
Platelets: 283 10*3/uL (ref 150–400)
RBC: 4.16 MIL/uL (ref 3.87–5.11)
RDW: 15.2 % (ref 11.5–15.5)
WBC: 6.3 10*3/uL (ref 4.0–10.5)
nRBC: 0 % (ref 0.0–0.2)

## 2022-08-25 LAB — PROTIME-INR
INR: 1.1 (ref 0.8–1.2)
Prothrombin Time: 14.2 seconds (ref 11.4–15.2)

## 2022-08-25 MED ORDER — LIDOCAINE HCL (PF) 1 % IJ SOLN
INTRAMUSCULAR | Status: AC
  Filled 2022-08-25: qty 30

## 2022-08-25 MED ORDER — GELATIN ABSORBABLE 12-7 MM EX MISC
CUTANEOUS | Status: AC
  Filled 2022-08-25: qty 1

## 2022-08-25 MED ORDER — HYDROCODONE-ACETAMINOPHEN 5-325 MG PO TABS: 1.0000 | ORAL_TABLET | ORAL | Status: DC | PRN

## 2022-08-25 MED ORDER — SODIUM CHLORIDE 0.9 % IV SOLN: INTRAVENOUS | Status: DC

## 2022-08-25 MED ORDER — MIDAZOLAM HCL 2 MG/2ML IJ SOLN
INTRAMUSCULAR | Status: AC | PRN
  Administered 2022-08-25: 1 mg via INTRAVENOUS
  Administered 2022-08-25: .5 mg via INTRAVENOUS

## 2022-08-25 MED ORDER — FENTANYL CITRATE (PF) 100 MCG/2ML IJ SOLN
INTRAMUSCULAR | Status: AC
  Filled 2022-08-25: qty 2

## 2022-08-25 MED ORDER — FENTANYL CITRATE (PF) 100 MCG/2ML IJ SOLN
INTRAMUSCULAR | Status: AC | PRN
  Administered 2022-08-25 (×3): 25 ug via INTRAVENOUS

## 2022-08-25 MED ORDER — MIDAZOLAM HCL 2 MG/2ML IJ SOLN
INTRAMUSCULAR | Status: AC
  Filled 2022-08-25: qty 2

## 2022-08-25 NOTE — Procedures (Signed)
  Procedure:  [place] core liver biopsy   Preprocedure diagnosis: Diagnoses of Abnormal LFTs, Elevated alkaline phosphatase level, and Preprocedural examination were pertinent to this visit.  Postprocedure diagnosis: same EBL:    minimal Complications:   none immediate  See full dictation in [employer].  Dillard Cannon MD Main # [phone] Pager  [phone] Mobile [phone]

## 2022-08-25 NOTE — Progress Notes (Signed)
Patient was given discharge instructions. She verbalized understanding. 

## 2022-08-25 NOTE — H&P (Signed)
Chief Complaint: Patient was seen in consultation today for abnormal liver enzymes  Referring Physician(s): Richland M  Supervising Physician: Corrie Mckusick  Patient Status: [facility] - Out-pt  History of Present Illness: Virginia [name] is a 76 y.o. female anxiety, depression, HTN, RLS, s/p Roux en Y gastric bypass in 2006 who presented to her GI doctor with complaint of extreme itchiness. She has a 10 year history of elevated alkaline phophatase, however she feels her symptoms have been progressively worse.   [name] presents to [facility] today in her usual state of health.  She has been NPO.  She does not take blood thinners.  She did hold her aspirin 26m since last Saturday.  She is understanding of the goals of the procedure today and is agreeable to proceed.  Her granddaughter BJerene Pitchwill be provided transportation and post-procedure supervision at home.   Past Medical History:  Diagnosis Date   Anxiety    Depression    HTN (hypertension)    resolved with weight loss s/p gastric bypass   Hypothyroidism    Since at least 2007   Osteoporosis    RLS (restless legs syndrome)    Sleep apnea    Stress fracture of left shoulder    Stroke ([facility]    2013/February 2017    Past Surgical History:  Procedure Laterality Date   BREAST BIOPSY Right    benign   CATARACT EXTRACTION Bilateral 2018   CHOLECYSTECTOMY     COLONOSCOPY  2004   internal hemorrhoids, left-sided diverticulae, splenic flexure polyp (629m, path unavailable at this time   COLONOSCOPY  04/2011   RMR: Internal and external hemorrhoids, rectal tubular adenoma removed, left-sided diverticulosis   COLONOSCOPY N/A 06/01/2016   Procedure: COLONOSCOPY;  Surgeon: RoDaneil DolinMD;  [facility];  Service: Endoscopy;  Laterality: N/A;  0830   COLONOSCOPY WITH PROPOFOL N/A 07/06/2022   Procedure: COLONOSCOPY WITH PROPOFOL;  Surgeon: RoDaneil DolinMD;  [facility];  Service:  Endoscopy;  Laterality: N/A;  10:15am   ESOPHAGOGASTRODUODENOSCOPY  2004   small hiatal hernia   GASTRIC ROUX-EN-Y  2006   REVERSE SHOULDER ARTHROPLASTY Left 09/29/2021   Procedure: LEFT REVERSE SHOULDER ARTHROPLASTY;  Surgeon: CaMordecai RasmussenMD;  Location: [facility];  Service: Orthopedics;  Laterality: Left;    Allergies: Augmentin [amoxicillin-pot clavulanate]  Medications: Prior to Admission medications   Medication Sig Start Date End Date Taking? Authorizing Provider  ALPRAZolam (XDuanne Moron0.5 MG tablet Take 0.5 mg by mouth See admin instructions. Take 0.5 mg at bedtime, may take 0.5 mg dose 2 times daily as needed for anxiety 03/27/11  Yes [provider]  amLODipine (NORVASC) 2.5 MG tablet Take 2.5 mg by mouth daily.   Yes [provider]  Ascorbic Acid (VITAMIN C) 1000 MG tablet Take 1,000 mg by mouth daily.   Yes [provider]  aspirin EC 81 MG tablet Take 81 mg by mouth daily.   Yes [provider]  atorvastatin (LIPITOR) 10 MG tablet Take 1 tablet (10 mg total) by mouth daily. Patient taking differently: Take 10 mg by mouth every evening. 01/22/18  Yes Dohmeier, CaAsencion PartridgeMD  B Complex-C (SUPER B COMPLEX PO) Take 1 tablet by mouth daily.   Yes [provider]  Cholecalciferol (VITAMIN D) 50 MCG (2000 UT) CAPS Take 2,000 Units by mouth daily.   Yes [provider]  cholestyramine (QUESTRAN) 4 g packet Take 1 packet (4 g total) by mouth  daily. 07/25/22  Yes Rourk, Cristopher Estimable, MD  diphenhydrAMINE (BENADRYL) 12.5 MG/5ML elixir Take 12.5-25 mg by mouth 4 (four) times daily as needed for allergies.   Yes [provider]  diphenhydrAMINE (BENADRYL) 25 MG tablet Take 25 mg by mouth daily as needed for allergies.   Yes [provider]  DULoxetine (CYMBALTA) 60 MG capsule Take 60 mg by mouth daily.   Yes [provider]  fluocinonide ointment (LIDEX) 6.76 % Apply 1 application topically 2 (two) times daily as needed  (irritation).   Yes [provider]  losartan (COZAAR) 100 MG tablet TAKE 1 TABLET BY MOUTH EVERY DAY 08/05/19  Yes Herminio Commons, MD  Multiple Vitamins-Minerals (ICAPS AREDS 2 PO) Take 1 capsule by mouth in the morning and at bedtime.   Yes [provider]  Multiple Vitamins-Minerals (MULTIVITAMIN WITH MINERALS) tablet Take 1 tablet by mouth daily.   Yes [provider]  rOPINIRole (REQUIP) 0.5 MG tablet TAKE 1 TABLET BY MOUTH AT BEDTIME. 05/16/22  Yes Dohmeier, Asencion Partridge, MD  SYNTHROID 150 MCG tablet Take 150 mcg by mouth daily before breakfast. 07/17/21  Yes [provider]     Family History  Problem Relation Age of Onset   Lung cancer Father    Stroke Father    Stroke Mother    Stroke Brother    Hypertension Brother    Stroke Maternal Grandmother    Stroke Maternal Grandfather    Cancer Other        family history    Coronary artery disease Other        family history    Arthritis Other        family history    Cancer Daughter     Social History   Socioeconomic History   Marital status: Divorced    Spouse name: Not on file   Number of children: 1   Years of education: college   Highest education level: Not on file  Occupational History   Occupation: retired     Fish farm manager: RETIRED  Tobacco Use   Smoking status: Former    Packs/day: 1.50    Years: 20.00    Total pack years: 30.00    Types: Cigarettes    Quit date: 11/28/1987    Years since quitting: 34.7   Smokeless tobacco: Never   Tobacco comments:    Quit x 25 plus years  Vaping Use   Vaping Use: Never used  Substance and Sexual Activity   Alcohol use: No    Alcohol/week: 0.0 standard drinks of alcohol   Drug use: No   Sexual activity: Not Currently  Other Topics Concern   Not on file  Social History Narrative   Caffeine 6 cups daily.  Retired/[employer],   2 yrs college,  Divorced, one daughter.   Social Determinants of Health   Financial Resource Strain: Not on file   Food Insecurity: Not on file  Transportation Needs: Not on file  Physical Activity: Not on file  Stress: Not on file  Social Connections: Not on file     Review of Systems: A 12 point ROS discussed and pertinent positives are indicated in the HPI above.  All other systems are negative.  Review of Systems  Constitutional:  Negative for fatigue and fever.  Respiratory:  Negative for cough and shortness of breath.   Gastrointestinal:  Negative for abdominal pain.  Genitourinary:  Negative for dysuria.  Musculoskeletal:  Negative for back pain.  Psychiatric/Behavioral:  Negative  for behavioral problems and confusion.     Vital Signs: BP 117/73 (BP Location: Right Arm)   Pulse 77   Temp 97.7 F (36.5 C) (Temporal)   Resp 16   Ht '5\' 3"'  (1.6 m)   Wt 177 lb (80.3 kg)   SpO2 100%   BMI 31.35 kg/m   Physical Exam Vitals and nursing note reviewed.  Constitutional:      General: She is not in acute distress.    Appearance: Normal appearance. She is not ill-appearing.  HENT:     Mouth/Throat:     Mouth: Mucous membranes are moist.     Pharynx: Oropharynx is clear.  Cardiovascular:     Rate and Rhythm: Normal rate and regular rhythm.  Pulmonary:     Effort: Pulmonary effort is normal. No respiratory distress.     Breath sounds: Normal breath sounds.  Abdominal:     General: Abdomen is flat.     Palpations: Abdomen is soft.  Skin:    General: Skin is warm and dry.  Neurological:     General: No focal deficit present.     Mental Status: She is alert and oriented to person, place, and time.  Psychiatric:        Mood and Affect: Mood normal.        Behavior: Behavior normal.        Thought Content: Thought content normal.        Judgment: Judgment normal.      MD Evaluation Airway: WNL Heart: WNL Abdomen: WNL Chest/ Lungs: WNL ASA  Classification: 3 Mallampati/Airway Score: Two   Imaging: MM 3D SCREEN BREAST BILATERAL  Result Date: 08/24/2022 CLINICAL DATA:   Screening. EXAM: DIGITAL SCREENING BILATERAL MAMMOGRAM WITH TOMOSYNTHESIS AND CAD TECHNIQUE: Bilateral screening digital craniocaudal and mediolateral oblique mammograms were obtained. Bilateral screening digital breast tomosynthesis was performed. The images were evaluated with computer-aided detection. COMPARISON:  Previous exam(s). ACR Breast Density Category a: The breast tissue is almost entirely fatty. FINDINGS: There are no findings suspicious for malignancy. IMPRESSION: No mammographic evidence of malignancy. A result letter of this screening mammogram will be mailed directly to the patient. RECOMMENDATION: Screening mammogram in one year. (Code:SM-B-01Y) BI-RADS CATEGORY  1: Negative. Electronically Signed   By: Margarette Canada M.D.   On: 08/24/2022 16:12   MR ABDOMEN MRCP W WO CONTAST  Result Date: 08/15/2022 CLINICAL DATA:  Elevated alkaline phosphatase level. Pruritus. Chronic abdominal pain. EXAM: MRI ABDOMEN WITHOUT AND WITH CONTRAST (INCLUDING MRCP) TECHNIQUE: Multiplanar multisequence MR imaging of the abdomen was performed both before and after the administration of intravenous contrast. Heavily T2-weighted images of the biliary and pancreatic ducts were obtained, and three-dimensional MRCP images were rendered by post processing. CONTRAST:  8 mL Vueway COMPARISON:  None Available. FINDINGS: Lower chest: No acute findings. Hepatobiliary: No hepatic masses identified. Several tiny sub-cm cysts are seen in the left hepatic lobe. No evidence of steatosis on chemical shift imaging. Prior cholecystectomy. No evidence of biliary ductal dilatation, with common bile duct measuring 7 mm. No evidence of choledocholithiasis or biliary stricture. Pancreas: No mass or inflammatory changes. No evidence of pancreatic ductal dilatation or pancreas divisum. Spleen:  Within normal limits in size and appearance. Adrenals/Urinary Tract: No suspicious masses identified. No evidence of hydronephrosis. Stomach/Bowel:  Previous gastric bypass surgery. Otherwise unremarkable. Vascular/Lymphatic: No pathologically enlarged lymph nodes identified. No acute vascular findings. Other:  None. Musculoskeletal:  No suspicious bone lesions identified. IMPRESSION: No acute findings. Prior cholecystectomy. No evidence of  choledocholithiasis or biliary obstruction. Electronically Signed   By: Marlaine Hind M.D.   On: 08/15/2022 11:18   MR 3D Recon At Scanner  Result Date: 08/15/2022 CLINICAL DATA:  Elevated alkaline phosphatase level. Pruritus. Chronic abdominal pain. EXAM: MRI ABDOMEN WITHOUT AND WITH CONTRAST (INCLUDING MRCP) TECHNIQUE: Multiplanar multisequence MR imaging of the abdomen was performed both before and after the administration of intravenous contrast. Heavily T2-weighted images of the biliary and pancreatic ducts were obtained, and three-dimensional MRCP images were rendered by post processing. CONTRAST:  8 mL Vueway COMPARISON:  None Available. FINDINGS: Lower chest: No acute findings. Hepatobiliary: No hepatic masses identified. Several tiny sub-cm cysts are seen in the left hepatic lobe. No evidence of steatosis on chemical shift imaging. Prior cholecystectomy. No evidence of biliary ductal dilatation, with common bile duct measuring 7 mm. No evidence of choledocholithiasis or biliary stricture. Pancreas: No mass or inflammatory changes. No evidence of pancreatic ductal dilatation or pancreas divisum. Spleen:  Within normal limits in size and appearance. Adrenals/Urinary Tract: No suspicious masses identified. No evidence of hydronephrosis. Stomach/Bowel: Previous gastric bypass surgery. Otherwise unremarkable. Vascular/Lymphatic: No pathologically enlarged lymph nodes identified. No acute vascular findings. Other:  None. Musculoskeletal:  No suspicious bone lesions identified. IMPRESSION: No acute findings. Prior cholecystectomy. No evidence of choledocholithiasis or biliary obstruction. Electronically Signed   By: Marlaine Hind M.D.   On: 08/15/2022 11:18    Labs:  CBC: Recent Labs    09/27/21 1359 07/25/22 1517 08/25/22 1115  WBC 6.7 7.7 6.3  HGB 12.1 11.6 11.6*  HCT 38.5 35.5 37.9  PLT 304 307 283    COAGS: Recent Labs    07/25/22 1517  INR 1.0    BMP: Recent Labs    09/27/21 1359 11/01/21 1528  NA 136 142  K 4.1 4.5  CL 103 105  CO2 26 22  GLUCOSE 85 81  BUN 15 16  CALCIUM 9.1 9.4  CREATININE 1.07* 1.04*  GFRNONAA 55*  --     LIVER FUNCTION TESTS: Recent Labs    11/01/21 1528 07/25/22 1517  BILITOT 0.2 0.4  AST 19 30  ALT 15 33*  ALKPHOS 262* 200*  PROT 6.4 6.8  ALBUMIN 3.8 4.3    TUMOR MARKERS: No results for input(s): "AFPTM", "CEA", "CA199", "CHROMGRNA" in the last 8760 hours.  Assessment and Plan: Patient with past medical history of elevated alk phosphatase  presents with complaint of worsening pruritis.  IR consulted for random liver biopsy at the request of Dr. Gala Romney. Case reviewed by Dr. Earleen Newport who approves patient for procedure.  Patient presents today in their usual state of health.  She has been NPO and is not currently on blood thinners as she has held her aspirin for 5 days.   Risks and benefits of biopsy was discussed with the patient and/or patient's family including, but not limited to bleeding, infection, damage to adjacent structures or low yield requiring additional tests.  All of the questions were answered and there is agreement to proceed.  Consent signed and in chart.   Thank you for this interesting consult.  I greatly enjoyed meeting Virginia [name] and look forward to participating in their care.  A copy of this report was sent to the requesting provider on this date.  Electronically Signed: Docia Barrier, PA 08/25/2022, 12:10 PM   I spent a total of  30 Minutes   in face to face in clinical consultation, greater than 50% of which was counseling/coordinating care for  abnormal liver enzymes.

## 2022-08-28 LAB — SURGICAL PATHOLOGY

## 2022-09-01 ENCOUNTER — Other Ambulatory Visit: Payer: Self-pay

## 2022-09-01 DIAGNOSIS — R7989 Other specified abnormal findings of blood chemistry: Secondary | ICD-10-CM

## 2022-09-07 DIAGNOSIS — E6609 Other obesity due to excess calories: Secondary | ICD-10-CM | POA: Diagnosis not present

## 2022-09-07 DIAGNOSIS — D649 Anemia, unspecified: Secondary | ICD-10-CM | POA: Diagnosis not present

## 2022-09-07 DIAGNOSIS — L509 Urticaria, unspecified: Secondary | ICD-10-CM | POA: Diagnosis not present

## 2022-09-07 DIAGNOSIS — Z23 Encounter for immunization: Secondary | ICD-10-CM | POA: Diagnosis not present

## 2022-09-07 DIAGNOSIS — R748 Abnormal levels of other serum enzymes: Secondary | ICD-10-CM | POA: Diagnosis not present

## 2022-09-07 DIAGNOSIS — E063 Autoimmune thyroiditis: Secondary | ICD-10-CM | POA: Diagnosis not present

## 2022-09-07 DIAGNOSIS — R5383 Other fatigue: Secondary | ICD-10-CM | POA: Diagnosis not present

## 2022-09-07 DIAGNOSIS — Z6830 Body mass index (BMI) 30.0-30.9, adult: Secondary | ICD-10-CM | POA: Diagnosis not present

## 2022-09-07 DIAGNOSIS — N3090 Cystitis, unspecified without hematuria: Secondary | ICD-10-CM | POA: Diagnosis not present

## 2022-09-29 ENCOUNTER — Ambulatory Visit: Payer: Medicare Other | Admitting: Orthopedic Surgery

## 2022-10-02 ENCOUNTER — Other Ambulatory Visit: Payer: Self-pay | Admitting: Neurology

## 2022-10-31 ENCOUNTER — Ambulatory Visit (INDEPENDENT_AMBULATORY_CARE_PROVIDER_SITE_OTHER): Payer: Medicare Other | Admitting: Orthopedic Surgery

## 2022-10-31 ENCOUNTER — Ambulatory Visit (INDEPENDENT_AMBULATORY_CARE_PROVIDER_SITE_OTHER): Payer: Medicare Other

## 2022-10-31 ENCOUNTER — Encounter: Payer: Self-pay | Admitting: Orthopedic Surgery

## 2022-10-31 VITALS — BP 124/64 | HR 77 | Ht 63.0 in | Wt 170.0 lb

## 2022-10-31 DIAGNOSIS — Z96612 Presence of left artificial shoulder joint: Secondary | ICD-10-CM

## 2022-11-02 ENCOUNTER — Encounter: Payer: Self-pay | Admitting: Orthopedic Surgery

## 2022-11-02 NOTE — Progress Notes (Signed)
Orthopaedic Postop Note  Assessment: Virginia [name] is a 76 y.o. female s/p Left Reverse Shoulder Arthroplasty (DOS: 09/29/2021); healed acromial stress fracture.  Plan: Virginia [name] is doing well.  Her pain is much better.  She does note limited range of motion as she has been reluctant to push her range of motion following the acromial stress fracture.  Provided reassurance.  Radiographs today look very good.  She has limited pain.  I think it is reasonable for her to start physical therapy again.  She was reluctant due to the issue she had following surgery.  We will place an updated referral.  She will let me know how she is doing.  Follow-up in 6 months.  Follow-up: Return in about 6 months (around 05/02/2023).  XR at next visit: Left shoulder  Subjective:  Chief Complaint  Patient presents with   Routine Post Op    Lt TSA DOS 09/29/21    History of Present Illness: Virginia [name] is a 76 y.o. female who presents following the above stated procedure.  Surgery was approximately 1 year ago.  Her recovery was complicated by an acromial stress fracture, which has since healed.  Her pain is much better.  The pain in her shoulder compared to prior to surgery is also much better.  As result of the stress fracture, she limited her motion.  And is subsequently lost some of her motion.  She is asking physical therapy is a good idea.  Review of Systems: No fevers or chills No numbness or tingling No Chest Pain No shortness of breath    Objective: BP 124/64   Pulse 77   Ht '5\' 3"'$  (1.6 m)   Wt 170 lb (77.1 kg)   BMI 30.11 kg/m   Physical Exam:  Alert and oriented.  No acute distress.  Left shoulder surgical incision is healing well.  No surrounding erythema or drainage.  No tenderness to palpation directly over the acromion.  Active forward elevation to 110 degrees.  Active abduction to 90 degrees.   Fingers are warm and well-perfused.  Sensation is intact within the axillary  nerve distribution.   IMAGING: I personally ordered and reviewed the following images:  X-rays of the left shoulder were obtained in clinic today.  No acute injuries noted.  Well-positioned left shoulder reverse arthroplasty.  No surrounding lucency.  No subsidence of the implants.  No evidence of recent fracture, or healed acromial stress fracture.  Glenohumeral joint is reduced.  : Left reverse shoulder arthroplasty in excellent position without subsidence  Mordecai Rasmussen, MD 11/02/2022 10:47 AM

## 2022-11-16 ENCOUNTER — Ambulatory Visit (INDEPENDENT_AMBULATORY_CARE_PROVIDER_SITE_OTHER): Payer: Medicare Other | Admitting: Allergy & Immunology

## 2022-11-16 ENCOUNTER — Other Ambulatory Visit: Payer: Self-pay

## 2022-11-16 ENCOUNTER — Encounter: Payer: Self-pay | Admitting: Allergy & Immunology

## 2022-11-16 VITALS — BP 114/70 | HR 76 | Temp 98.0°F | Resp 16 | Ht 63.0 in | Wt 165.9 lb

## 2022-11-16 DIAGNOSIS — J310 Chronic rhinitis: Secondary | ICD-10-CM | POA: Diagnosis not present

## 2022-11-16 DIAGNOSIS — L299 Pruritus, unspecified: Secondary | ICD-10-CM | POA: Diagnosis not present

## 2022-11-16 DIAGNOSIS — I639 Cerebral infarction, unspecified: Secondary | ICD-10-CM | POA: Diagnosis not present

## 2022-11-16 DIAGNOSIS — R7989 Other specified abnormal findings of blood chemistry: Secondary | ICD-10-CM

## 2022-11-16 MED ORDER — IPRATROPIUM BROMIDE 0.03 % NA SOLN: 2.0000 | Freq: Three times a day (TID) | NASAL | 3 refills | Status: DC | PRN

## 2022-11-16 NOTE — Patient Instructions (Addendum)
1. Non-allergic rhinitis - Testing today showed: NEGATIVE TO THE ENTIRE PANEL - Copy of test results provided.  - Start taking: Atrovent (ipratropium) 0.03% one spray per nostril 2-3 times daily as needed (CAN BE OVER DRYING) - You can use an extra dose of the antihistamine, if needed, for breakthrough symptoms.  - Consider nasal saline rinses 1-2 times daily to remove allergens from the nasal cavities as well as help with mucous clearance (this is especially helpful to do before the nasal sprays are given)  2. Pruritus - We are going to hold off on further workup for now since you are heading in the right direction - Let's touch base in 3 months to see how things are going.   3. Return in about 3 months (around 02/15/2023).    Please inform us of any Emergency Department visits, hospitalizations, or changes in symptoms. Call us before going to the ED for breathing or allergy symptoms since we might be able to fit you in for a sick visit. Feel free to contact us anytime with any questions, problems, or concerns.  It was a pleasure to meet you today!  Websites that have reliable patient information: 1. American Academy of Asthma, Allergy, and Immunology: www.aaaai.org 2. Food Allergy Research and Education (FARE): foodallergy.org 3. Mothers of Asthmatics: http://www.asthmacommunitynetwork.org 4. American College of Allergy, Asthma, and Immunology: www.acaai.org   COVID-19 Vaccine Information can be found at: [email or web address].[email or web address] For questions related to vaccine distribution or appointments, please email [email or web address] or call [phone].   We realize that you might be concerned about having an allergic reaction to the COVID19 vaccines. To help with that concern, WE ARE OFFERING THE COVID19 VACCINES IN OUR OFFICE! Ask the front desk for dates!     "Like" [place] on Facebook and Instagram for our latest updates!      A  healthy democracy works best when [employer] participate! Make sure you are registered to vote! If you have moved or changed any of your contact information, you will need to get this updated before voting!  In some cases, you MAY be able to register to vote online: [email or web address]      Airborne Adult Perc - 11/16/22 1000     Time Antigen Placed 1016    Allergen Manufacturer Lavella Hammock    Location Back    Number of Test 59    1. Control-Buffer 50% Glycerol Negative    2. Control-Histamine 1 mg/ml 2+    3. Albumin saline Negative    4. [place] Negative    5. [place] Negative    6. Johnson Negative    7. [place] Blue Negative    8. Meadow Fescue Negative    9. Perennial Rye Negative    10. Sweet Vernal Negative    11. Timothy Negative    12. Cocklebur Negative    13. Burweed Marshelder Negative    14. Ragweed, short Negative    15. Ragweed, Giant Negative    16. Plantain,  English Negative    17. Lamb's Quarters Negative    18. Sheep Sorrell Negative    19. Rough Pigweed Negative    20. Marsh Elder, Rough Negative    21. Mugwort, Common Negative    22. Ash mix Negative    23. Birch mix Negative    24. Beech American Negative    25. Box, Elder Negative    26. Cedar, red Negative    27. Cottonwood, [place] Negative    28. Elm mix Negative  29. Hickory Negative    30. Maple mix Negative    31. Oak, [place] mix Negative    32. Pecan Pollen Negative    33. Pine mix Negative    34. Sycamore Eastern Negative    35. Cooter, Black Pollen Negative    36. Alternaria alternata Negative    37. Cladosporium Herbarum Negative    38. Aspergillus mix Negative    39. Penicillium mix Negative    40. Bipolaris sorokiniana (Helminthosporium) Negative    41. Drechslera spicifera (Curvularia) Negative    42. Mucor plumbeus Negative    43. Fusarium moniliforme Negative    44. Aureobasidium pullulans (pullulara) Negative    45. Rhizopus oryzae Negative     46. Botrytis cinera Negative    47. Epicoccum nigrum Negative    48. Phoma betae Negative    49. Candida Albicans Negative    50. Trichophyton mentagrophytes Negative    51. Mite, D Farinae  5,000 AU/ml Negative    52. Mite, D Pteronyssinus  5,000 AU/ml Negative    53. Cat Hair 10,000 BAU/ml Negative    54.  Dog Epithelia Negative    55. Mixed Feathers Negative    56. Horse Epithelia Negative    57. Cockroach, German Negative    58. Mouse Negative    59. Tobacco Leaf Negative             Intradermal - 11/16/22 1056     Time Antigen Placed 1100    Allergen Manufacturer Greer    Location Arm    Number of Test 15    Intradermal Select    Control Negative    [place] Negative    Johnson Negative    7 Grass Negative    Ragweed mix Negative    Weed mix Negative    Tree mix Negative    Mold 1 Negative    Mold 2 Negative    Mold 3 Negative    Mold 4 Negative    Cat Negative    Dog Negative    Cockroach Negative    Mite mix Negative

## 2022-11-16 NOTE — Progress Notes (Signed)
NEW PATIENT  Date of Service/Encounter:  11/16/22  Consult requested by: Redmond School, MD   Assessment:   Non-allergic rhinitis  Pruritus  Elevated alkaline phosphatase - thought to be related to her gallbladder and the pruritus (improved with initiation of cholestyramine)  Plan/Recommendations:   1. Non-allergic rhinitis - Testing today showed: NEGATIVE TO THE ENTIRE PANEL - Copy of test results provided.  - Start taking: Atrovent (ipratropium) 0.03% one spray per nostril 2-3 times daily as needed (CAN BE OVER DRYING) - You can use an extra dose of the antihistamine, if needed, for breakthrough symptoms.  - Consider nasal saline rinses 1-2 times daily to remove allergens from the nasal cavities as well as help with mucous clearance (this is especially helpful to do before the nasal sprays are given)  2. Pruritus - We are going to hold off on further workup for now since you are heading in the right direction - Let's touch base in 3 months to see how things are going.   3. Return in about 3 months (around 02/15/2023).   This note in its entirety was forwarded to the Provider who requested this consultation.  Subjective:   Virginia [name] is a 76 y.o. female presenting today for evaluation of  Chief Complaint  Patient presents with   Establish Care   Urticaria    Pt c/o post nasal drip    Virginia [name] has a history of the following: Patient Active Problem List   Diagnosis Date Noted   Palpitations 04/14/2022   Tachycardia 04/14/2022   Glenohumeral arthritis, left 09/29/2021   Intolerance of continuous positive airway pressure (CPAP) ventilation 02/17/2021   Status post bariatric surgery 02/17/2021   Periodic limb movement disorder (PLMD) 02/17/2021   OSA (obstructive sleep apnea) 02/17/2021   RLS (restless legs syndrome) 01/22/2018   History of colonic polyps    Constipation 05/11/2016   Gait difficulty 01/04/2016   Aphasia 01/04/2016   Acute CVA  (cerebrovascular accident) ([place]) 01/04/2016   CVA (cerebral infarction) 01/04/2016   Ataxia    OSA on CPAP 12/02/2013   CTS (carpal tunnel syndrome) 03/18/2013   Wrist fracture 02/04/2013   Distal radius fracture 12/26/2012   Hyperlipidemia 06/30/2012   Elevated blood pressure reading without diagnosis of hypertension 06/29/2012   Stroke ([place]) 06/28/2012   Hypothyroidism 06/28/2012   Anxiety 06/28/2012   Personal history of colonic polyps 04/26/2011   Family history of colonic polyps 04/26/2011   ARTHRITIS, LEFT KNEE 06/06/2010   DERANGEMENT OF POSTERIOR HORN OF MEDIAL MENISCUS 06/06/2010   ANEMIA, B12 DEFICIENCY 02/07/2010   TRIGGER FINGER, THUMB 02/07/2010   SHOULDER PAIN 10/14/2008   BURSITIS, SHOULDER 10/14/2008   TRIGGER FINGER 06/15/2008    History obtained from: chart review and patient.  Virginia [name] was referred by Redmond School, MD.     Virginia [name] is a 76 y.o. female presenting for an evaluation of itching .  She started having issues three years ago. She talked to her PCP Dr. Gerarda Fraction. She had some blood work that was all normal. This was the extent of it. One year later, she was still itching. She did her own research between these episodes. She decided that it was due to her liver. She was told that it was not her liver because there was no jaundice or other problems noted. Nothing came back positive.   She told her PCP that she was "tired of this shit". She continued to have itching. She reports that she cannot live  like this any longer. This is only itching "from the inside" without any rashes on the outside. She has noted no food triggers or environmental triggers. She itches so bad that she draws blood. She has been "living on Benadryl" around one bottle per month. She takes a sip of it occasionally and this helps a lot. Within five mintues, the symptoms start to decrease. She has not tried longer acting antihistamines. She is mostly having the itching on her back.  She describes this as "minor itching".   She was referred to Dr. Gala Romney and he said that it was her liver. She tells me that there is something wrong with the bile in her system. She was started on cholestyramine which binds her bile and removes it from the body. This has been a god send for her and her symptoms have improved. She was thinking of not coming to me because her symptoms had nearly normalized.   Otherwise, there is no history of other atopic diseases, including asthma, food allergies, drug allergies, stinging insect allergies, or contact dermatitis. There is no significant infectious history. Vaccinations are up to date.    Past Medical History: Patient Active Problem List   Diagnosis Date Noted   Palpitations 04/14/2022   Tachycardia 04/14/2022   Glenohumeral arthritis, left 09/29/2021   Intolerance of continuous positive airway pressure (CPAP) ventilation 02/17/2021   Status post bariatric surgery 02/17/2021   Periodic limb movement disorder (PLMD) 02/17/2021   OSA (obstructive sleep apnea) 02/17/2021   RLS (restless legs syndrome) 01/22/2018   History of colonic polyps    Constipation 05/11/2016   Gait difficulty 01/04/2016   Aphasia 01/04/2016   Acute CVA (cerebrovascular accident) ([place]) 01/04/2016   CVA (cerebral infarction) 01/04/2016   Ataxia    OSA on CPAP 12/02/2013   CTS (carpal tunnel syndrome) 03/18/2013   Wrist fracture 02/04/2013   Distal radius fracture 12/26/2012   Hyperlipidemia 06/30/2012   Elevated blood pressure reading without diagnosis of hypertension 06/29/2012   Stroke ([place]) 06/28/2012   Hypothyroidism 06/28/2012   Anxiety 06/28/2012   Personal history of colonic polyps 04/26/2011   Family history of colonic polyps 04/26/2011   ARTHRITIS, LEFT KNEE 06/06/2010   DERANGEMENT OF POSTERIOR HORN OF MEDIAL MENISCUS 06/06/2010   ANEMIA, B12 DEFICIENCY 02/07/2010   TRIGGER FINGER, THUMB 02/07/2010   SHOULDER PAIN 10/14/2008   BURSITIS, SHOULDER  10/14/2008   TRIGGER FINGER 06/15/2008    Medication List:  Allergies as of 11/16/2022       Reactions   Augmentin [amoxicillin-pot Clavulanate] Anaphylaxis        Medication List        Accurate as of November 16, 2022  1:27 PM. If you have any questions, ask your nurse or doctor.          ALPRAZolam 0.5 MG tablet Commonly known as: XANAX Take 0.5 mg by mouth See admin instructions. Take 0.5 mg at bedtime, may take 0.5 mg dose 2 times daily as needed for anxiety   amLODipine 2.5 MG tablet Commonly known as: NORVASC Take 2.5 mg by mouth daily.   aspirin EC 81 MG tablet Take 81 mg by mouth daily.   atorvastatin 10 MG tablet Commonly known as: LIPITOR Take 1 tablet (10 mg total) by mouth daily. What changed: when to take this   cholestyramine 4 g packet Commonly known as: QUESTRAN Take 1 packet (4 g total) by mouth daily.   diphenhydrAMINE 25 MG tablet Commonly known as: BENADRYL Take 25 mg by  mouth daily as needed for allergies.   diphenhydrAMINE 12.5 MG/5ML elixir Commonly known as: BENADRYL Take 12.5-25 mg by mouth 4 (four) times daily as needed for allergies.   DULoxetine 60 MG capsule Commonly known as: CYMBALTA Take 60 mg by mouth daily.   fluocinonide ointment 0.05 % Commonly known as: LIDEX Apply 1 application topically 2 (two) times daily as needed (irritation).   ICAPS AREDS 2 PO Take 1 capsule by mouth in the morning and at bedtime.   multivitamin with minerals tablet Take 1 tablet by mouth daily.   ipratropium 0.03 % nasal spray Commonly known as: ATROVENT Place 2 sprays into both nostrils 3 (three) times daily as needed for rhinitis. Started by: Valentina Shaggy, MD   losartan 100 MG tablet Commonly known as: COZAAR TAKE 1 TABLET BY MOUTH EVERY DAY   rOPINIRole 0.5 MG tablet Commonly known as: REQUIP TAKE 1 TABLET BY MOUTH AT BEDTIME.   SUPER B COMPLEX PO Take 1 tablet by mouth daily.   Synthroid 150 MCG tablet Generic  drug: levothyroxine Take 150 mcg by mouth daily before breakfast.   vitamin C 1000 MG tablet Take 1,000 mg by mouth daily.   Vitamin D 50 MCG (2000 UT) Caps Take 2,000 Units by mouth daily.        Birth History: non-contributory  Developmental History: non-contributory  Past Surgical History: Past Surgical History:  Procedure Laterality Date   BREAST BIOPSY Right    benign   CATARACT EXTRACTION Bilateral 2018   CHOLECYSTECTOMY     COLONOSCOPY  2004   internal hemorrhoids, left-sided diverticulae, splenic flexure polyp (13m), path unavailable at this time   COLONOSCOPY  04/2011   RMR: Internal and external hemorrhoids, rectal tubular adenoma removed, left-sided diverticulosis   COLONOSCOPY N/A 06/01/2016   Procedure: COLONOSCOPY;  Surgeon: RDaneil Dolin MD;  [facility];  Service: Endoscopy;  Laterality: N/A;  0830   COLONOSCOPY WITH PROPOFOL N/A 07/06/2022   Procedure: COLONOSCOPY WITH PROPOFOL;  Surgeon: RDaneil Dolin MD;  Location: [facility];  Service: Endoscopy;  Laterality: N/A;  10:15am   ESOPHAGOGASTRODUODENOSCOPY  2004   small hiatal hernia   GASTRIC ROUX-EN-Y  2006   REVERSE SHOULDER ARTHROPLASTY Left 09/29/2021   Procedure: LEFT REVERSE SHOULDER ARTHROPLASTY;  Surgeon: CMordecai Rasmussen MD;  Location: [facility];  Service: Orthopedics;  Laterality: Left;     Family History: Family History  Problem Relation Age of Onset   Eczema Mother    Stroke Mother    Lung cancer Father    Stroke Father    Stroke Brother    Hypertension Brother    Stroke Maternal Grandmother    Stroke Maternal Grandfather    Allergic rhinitis Daughter    Cancer Daughter    Cancer Other        family history    Coronary artery disease Other        family history    Arthritis Other        family history      Social History: FDionnalives at home in a house that is 74years old.  There is carpeting throughout the home.  She has gas heating and heat pump for cooling.   There are dogs and cats inside of the home.  There are dust mite covers on the bed, but not the pillows.  She is not a smoker.  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   Review of Systems  Constitutional: Negative.  Negative for chills, fever, malaise/fatigue and weight loss.  HENT: Negative.  Negative for congestion, ear discharge and ear pain.        Positive for postnasal drip.  Eyes:  Negative for pain, discharge and redness.  Respiratory:  Negative for cough, sputum production, shortness of breath and wheezing.   Cardiovascular: Negative.  Negative for chest pain and palpitations.  Gastrointestinal:  Negative for abdominal pain, heartburn, nausea and vomiting.  Skin:  Positive for rash. Negative for itching.  Neurological:  Negative for dizziness and headaches.  Endo/Heme/Allergies:  Negative for environmental allergies. Does not bruise/bleed easily.       Objective:   Blood pressure 114/70, pulse 76, temperature 98 F (36.7 C), temperature source Temporal, resp. rate 16, height '5\' 3"'$  (1.6 m), weight 165 lb 14.4 oz (75.3 kg), SpO2 99 %. Body mass index is 29.39 kg/m.     Physical Exam Vitals reviewed.  Constitutional:      Appearance: She is well-developed.     Comments: Lovely and talkative.  HENT:     Head: Normocephalic and atraumatic.     Right Ear: Tympanic membrane, ear canal and external ear normal. No drainage, swelling or tenderness. Tympanic membrane is not injected, scarred, erythematous, retracted or bulging.     Left Ear: Tympanic membrane, ear canal and external ear normal. No drainage, swelling or tenderness. Tympanic membrane is not injected, scarred, erythematous, retracted or bulging.     Nose: No nasal deformity, septal deviation, mucosal edema or rhinorrhea.     Right Sinus:  No maxillary sinus tenderness or frontal sinus tenderness.     Left Sinus: No maxillary sinus tenderness or frontal sinus tenderness.     Mouth/Throat:     Mouth: Mucous membranes are not pale and not dry.     Pharynx: Uvula midline.  Eyes:     General:        Right eye: No discharge.        Left eye: No discharge.     Conjunctiva/sclera: Conjunctivae normal.     Right eye: Right conjunctiva is not injected. No chemosis.    Left eye: Left conjunctiva is not injected. No chemosis.    Pupils: Pupils are equal, round, and reactive to light.  Cardiovascular:     Rate and Rhythm: Normal rate and regular rhythm.     Heart sounds: Normal heart sounds.  Pulmonary:     Effort: Pulmonary effort is normal. No tachypnea, accessory muscle usage or respiratory distress.     Breath sounds: Normal breath sounds. No wheezing, rhonchi or rales.  Chest:     Chest wall: No tenderness.  Abdominal:     Tenderness: There is no abdominal tenderness. There is no guarding or rebound.  Lymphadenopathy:     Head:     Right side of head: No submandibular, tonsillar or occipital adenopathy.     Left side of head: No submandibular, tonsillar or occipital adenopathy.     Cervical: No cervical adenopathy.  Skin:    General: Skin is warm.     Capillary Refill: Capillary refill takes less than 2 seconds.     Coloration: Skin is not pale.     Findings: No abrasion, erythema, petechiae or rash. Rash is not papular, urticarial or vesicular.  Neurological:  Mental Status: She is alert.      Diagnostic studies:   Allergy Studies:     Airborne Adult Perc - 11/16/22 1000     Time Antigen Placed 1016    Allergen Manufacturer Lavella Hammock    Location Back    Number of Test 59    1. Control-Buffer 50% Glycerol Negative    2. Control-Histamine 1 mg/ml 2+    3. Albumin saline Negative    4. [place] Negative    5. [place] Negative    6. [place] Negative    7. [place] Blue Negative    8. Meadow Fescue Negative     9. Perennial Rye Negative    10. Sweet Vernal Negative    11. Timothy Negative    12. Cocklebur Negative    13. Burweed Marshelder Negative    14. Ragweed, short Negative    15. Ragweed, Giant Negative    16. Plantain,  English Negative    17. Lamb's Quarters Negative    18. Sheep Sorrell Negative    19. Rough Pigweed Negative    20. Marsh Elder, Rough Negative    21. Mugwort, Common Negative    22. Ash mix Negative    23. Birch mix Negative    24. Beech American Negative    25. Box, Elder Negative    26. Cedar, red Negative    27. Cottonwood, [place] Negative    28. Elm mix Negative    29. Hickory Negative    30. Maple mix Negative    31. Oak, [place] mix Negative    32. Pecan Pollen Negative    33. Pine mix Negative    34. Sycamore Eastern Negative    35. Pope, Black Pollen Negative    36. Alternaria alternata Negative    37. Cladosporium Herbarum Negative    38. Aspergillus mix Negative    39. Penicillium mix Negative    40. Bipolaris sorokiniana (Helminthosporium) Negative    41. Drechslera spicifera (Curvularia) Negative    42. Mucor plumbeus Negative    43. Fusarium moniliforme Negative    44. Aureobasidium pullulans (pullulara) Negative    45. Rhizopus oryzae Negative    46. Botrytis cinera Negative    47. Epicoccum nigrum Negative    48. Phoma betae Negative    49. Candida Albicans Negative    50. Trichophyton mentagrophytes Negative    51. Mite, D Farinae  5,000 AU/ml Negative    52. Mite, D Pteronyssinus  5,000 AU/ml Negative    53. Cat Hair 10,000 BAU/ml Negative    54.  Dog Epithelia Negative    55. Mixed Feathers Negative    56. Horse Epithelia Negative    57. Cockroach, German Negative    58. Mouse Negative    59. Tobacco Leaf Negative             Intradermal - 11/16/22 1056     Time Antigen Placed 1100    Allergen Manufacturer Greer    Location Arm    Number of Test 15    Intradermal Select    Control Negative    [place] Negative     [place] Negative    7 Grass Negative    Ragweed mix Negative    Weed mix Negative    Tree mix Negative    Mold 1 Negative    Mold 2 Negative    Mold 3 Negative    Mold 4 Negative    Cat Negative    Dog Negative  Cockroach Negative    Mite mix Negative             Allergy testing results were read and interpreted by myself, documented by clinical staff.         Salvatore Marvel, MD [facility]

## 2022-11-17 ENCOUNTER — Encounter (HOSPITAL_COMMUNITY): Payer: Self-pay | Admitting: Occupational Therapy

## 2022-11-17 ENCOUNTER — Encounter (HOSPITAL_COMMUNITY): Payer: Medicare Other | Admitting: Occupational Therapy

## 2022-11-17 ENCOUNTER — Ambulatory Visit (HOSPITAL_COMMUNITY): Payer: Medicare Other | Attending: Orthopedic Surgery | Admitting: Occupational Therapy

## 2022-11-17 DIAGNOSIS — M25512 Pain in left shoulder: Secondary | ICD-10-CM | POA: Insufficient documentation

## 2022-11-17 DIAGNOSIS — R29898 Other symptoms and signs involving the musculoskeletal system: Secondary | ICD-10-CM | POA: Insufficient documentation

## 2022-11-17 DIAGNOSIS — R7989 Other specified abnormal findings of blood chemistry: Secondary | ICD-10-CM | POA: Diagnosis not present

## 2022-11-17 DIAGNOSIS — M25612 Stiffness of left shoulder, not elsewhere classified: Secondary | ICD-10-CM | POA: Diagnosis not present

## 2022-11-17 NOTE — Patient Instructions (Signed)

## 2022-11-17 NOTE — Therapy (Signed)
OUTPATIENT OCCUPATIONAL THERAPY ORTHO EVALUATION  Patient Name: Virginia [name] MRN: [id number] DOB:29-Jul-1946, 76 y.o., female Today's Date: 11/17/2022  PCP: Dr. Redmond School REFERRING PROVIDER: Dr. Larena Glassman  END OF SESSION:  OT End of Session - 11/17/22 1158     Visit Number 1    Number of Visits 6    Date for OT Re-Evaluation 12/29/22    Authorization Type 1) Medicare A & B 2) BCBS    Progress Note Due on Visit 10    OT Start Time 1115    OT Stop Time 1157    OT Time Calculation (min) 42 min    Activity Tolerance Patient tolerated treatment well    Behavior During Therapy WFL for tasks assessed/performed             Past Medical History:  Diagnosis Date   Anxiety    Depression    HTN (hypertension)    resolved with weight loss s/p gastric bypass   Hypothyroidism    Since at least 2007   Osteoporosis    RLS (restless legs syndrome)    Sleep apnea    Stress fracture of left shoulder    Stroke [facility])    2013/February 2017   Urticaria    Past Surgical History:  Procedure Laterality Date   BREAST BIOPSY Right    benign   CATARACT EXTRACTION Bilateral 2018   CHOLECYSTECTOMY     COLONOSCOPY  2004   internal hemorrhoids, left-sided diverticulae, splenic flexure polyp (60m), path unavailable at this time   COLONOSCOPY  04/2011   RMR: Internal and external hemorrhoids, rectal tubular adenoma removed, left-sided diverticulosis   COLONOSCOPY N/A 06/01/2016   Procedure: COLONOSCOPY;  Surgeon: RDaneil Dolin MD;  [facility];  Service: Endoscopy;  Laterality: N/A;  0830   COLONOSCOPY WITH PROPOFOL N/A 07/06/2022   Procedure: COLONOSCOPY WITH PROPOFOL;  Surgeon: RDaneil Dolin MD;  Location: [facility];  Service: Endoscopy;  Laterality: N/A;  10:15am   ESOPHAGOGASTRODUODENOSCOPY  2004   small hiatal hernia   GASTRIC ROUX-EN-Y  2006   REVERSE SHOULDER ARTHROPLASTY Left 09/29/2021   Procedure: LEFT REVERSE SHOULDER ARTHROPLASTY;  Surgeon:  CMordecai Rasmussen MD;  Location: [facility] OR[facility];  Service: Orthopedics;  Laterality: Left;   Patient Active Problem List   Diagnosis Date Noted   Palpitations 04/14/2022   Tachycardia 04/14/2022   Glenohumeral arthritis, left 09/29/2021   Intolerance of continuous positive airway pressure (CPAP) ventilation 02/17/2021   Status post bariatric surgery 02/17/2021   Periodic limb movement disorder (PLMD) 02/17/2021   OSA (obstructive sleep apnea) 02/17/2021   RLS (restless legs syndrome) 01/22/2018   History of colonic polyps    Constipation 05/11/2016   Gait difficulty 01/04/2016   Aphasia 01/04/2016   Ac[place]bro[place] 01/04/2016   CVA (cerebral infarction) 01/04/2016   Ataxia    OSA on CPAP 12/02/2013   CTS (carpal tunnel syndrome) 03/18/2013   Wrist fracture 02/04/2013   Distal radius fracture 12/26/2012   Hyperlipidemia 06/30/2012   Elevated blood pressure reading without diagnosis of hypertension 06/29/2012   [place]   Hypothyroidism 06/28/2012   Anxiety 06/28/2012   Personal history of colonic polyps 04/26/2011   Family history of colonic polyps 04/26/2011   ARTHRITIS, LEFT KNEE 06/06/2010   DERANGEMENT OF POSTERIOR HORN OF MEDIAL MENISCUS 06/06/2010   ANEMIA, B12 DEFICIENCY 02/07/2010   TRIGGER FINGER, THUMB 02/07/2010   SHOULDER PAIN 10/14/2008   BURSITIS, SHOULDER 10/14/2008   TRIGGER  FINGER 06/15/2008    ONSET DATE: 02/22/22  REFERRING DIAG: Left shoulder pain  THERAPY DIAG:  Stiffness of left shoulder, not elsewhere classified  Acute pain of left shoulder  Other symptoms and signs involving the musculoskeletal system  Rationale for Evaluation and Treatment: Rehabilitation  SUBJECTIVE:   SUBJECTIVE STATEMENT: S: I don't know what I did to injure it.  Pt accompanied by: self  PERTINENT HISTORY: Pt is a 76 y/o female s/p left reverse TSA on 09/29/21 who received OT at this clinic from 10/06/21 to 12/30/21. Pt reports shortly  after finishing therapy her arm began hurting and she had developed a stress fracture in her left shoulder. Pt was in a sling for a few weeks, did some gentle home exercises, and the pain stopped but her ROM has decreased.   PRECAUTIONS: None  WEIGHT BEARING RESTRICTIONS: No  PAIN:  Are you having pain? No  FALLS: Has patient fallen in last 6 months? No  PLOF: Independent  PATIENT GOALS: To regain my ROM and strength to use this arm better.   NEXT MD VISIT: None scheduled  OBJECTIVE:   HAND DOMINANCE: Right  ADLs: Overall ADLs: Pt is having difficulty with reaching forward into cabinets and onto shelves, reaching behind her back for dressing and hygiene. Pt reports LUE is weaker than the RUE, impacting meal preparation and housekeeping.    FUNCTIONAL OUTCOME MEASURES: FOTO: 56/100  UPPER EXTREMITY ROM:     Passive ROM Left eval  Shoulder flexion 145  Shoulder abduction 140  Shoulder internal rotation 90  Shoulder external rotation 45  (Blank rows = not tested)   Active ROM Left eval  Shoulder flexion 86  Shoulder abduction 85  Shoulder internal rotation 90  Shoulder external rotation 29  (Blank rows = not tested)   UPPER EXTREMITY MMT:     MMT Left eval  Shoulder flexion 3+/5  Shoulder abduction 4-/5  Shoulder internal rotation 4+/5  Shoulder external rotation 4-/5  (Blank rows = not tested)   COGNITION: Overall cognitive status: Within functional limits for tasks assessed   TODAY'S TREATMENT:                                                                                                                              DATE: N/A-eval only     PATIENT EDUCATION: Education details: AA/ROM Person educated: Patient Education method: Consulting civil engineer, Media planner, and Handouts Education comprehension: verbalized understanding and returned demonstration  HOME EXERCISE PROGRAM: Eval: AA/ROM  GOALS: Goals reviewed with patient? Yes  SHORT TERM GOALS:  Target date: 12/17/22  Pt will be provided with and educated on HEP to improve mobility required for ADL completion using LUE as non-dominant.   Goal status: INITIAL  2.  Pt will increase LUE A/ROM by at least 30 degrees to improve ability to reach overhead and behind back during dressing and bathing tasks.   Goal status: INITIAL  3.  Pt will increase strength in LUE  to 4/5 or greater to improve ability to carry and lift items during meal preparation or housekeeping tasks.   Goal status: INITIAL  4.  Pt will increase activity tolerance by completing housework tasks without requiring any rest breaks using LUE as assist.   Goal status: INITIAL  5.  Pt will maintain low pain level of 2/10 or less to enable continued functional reaching and active use of LUE during functional task completion.   Goal status: INITIAL  6.  Pt will decrease LUE fascial restrictions to minimal amounts or less to improve ability to perform functional reaching tasks.   Goal status: INITIAL    ASSESSMENT:  CLINICAL IMPRESSION: Patient is a 76 y.o. female who was seen today for occupational therapy evaluation for left shoulder pain.    PERFORMANCE DEFICITS: in functional skills including ADLs, IADLs, ROM, strength, pain, fascial restrictions, and UE functional use  IMPAIRMENTS: are limiting patient from ADLs, IADLs, rest and sleep, and leisure.   COMORBIDITIES: has no other co-morbidities that affects occupational performance. Patient will benefit from skilled OT to address above impairments and improve overall function.  MODIFICATION OR ASSISTANCE TO COMPLETE EVALUATION: No modification of tasks or assist necessary to complete an evaluation.  OT OCCUPATIONAL PROFILE AND HISTORY: Problem focused assessment: Including review of records relating to presenting problem.  CLINICAL DECISION MAKING: LOW - limited treatment options, no task modification necessary  REHAB POTENTIAL: Good  EVALUATION  COMPLEXITY: Low      PLAN:  OT FREQUENCY: 1x/week  OT DURATION: 6 weeks  PLANNED INTERVENTIONS: self care/ADL training, therapeutic exercise, therapeutic activity, neuromuscular re-education, manual therapy, passive range of motion, splinting, electrical stimulation, ultrasound, moist heat, cryotherapy, patient/family education, and DME and/or AE instructions  CONSULTED AND AGREED WITH PLAN OF CARE: Patient  PLAN FOR NEXT SESSION: Follow up on HEP, begin scapular strengthening, proximal shoulder strengthening, A/ROM progressing to strengthening   Guadelupe Sabin, OTR/L  [phone] 11/17/2022, 11:59 AM

## 2022-11-18 LAB — HEPATIC FUNCTION PANEL
ALT: 23 IU/L (ref 0–32)
AST: 23 IU/L (ref 0–40)
Albumin: 4.2 g/dL (ref 3.8–4.8)
Alkaline Phosphatase: 200 IU/L — ABNORMAL HIGH (ref 44–121)
Bilirubin Total: 0.3 mg/dL (ref 0.0–1.2)
Bilirubin, Direct: <0.1 mg/dL (ref 0.00–0.40)
Total Protein: 6.6 g/dL (ref 6.0–8.5)

## 2022-11-22 ENCOUNTER — Encounter (HOSPITAL_COMMUNITY): Payer: Self-pay | Admitting: Occupational Therapy

## 2022-11-22 ENCOUNTER — Ambulatory Visit (HOSPITAL_COMMUNITY): Payer: Medicare Other | Admitting: Occupational Therapy

## 2022-11-22 DIAGNOSIS — M25512 Pain in left shoulder: Secondary | ICD-10-CM | POA: Diagnosis not present

## 2022-11-22 DIAGNOSIS — R29898 Other symptoms and signs involving the musculoskeletal system: Secondary | ICD-10-CM | POA: Diagnosis not present

## 2022-11-22 DIAGNOSIS — M25612 Stiffness of left shoulder, not elsewhere classified: Secondary | ICD-10-CM

## 2022-11-22 NOTE — Therapy (Signed)
OUTPATIENT OCCUPATIONAL THERAPY ORTHO TREATMENT NOTE  Patient Name: Virginia [name] MRN: [id number] DOB:05-17-1946, 76 y.o., female Today's Date: 11/22/2022  PCP: Dr. Redmond School REFERRING PROVIDER: Dr. Larena Glassman  END OF SESSION:  OT End of Session - 11/22/22 1649     Visit Number 2    Number of Visits 6    Date for OT Re-Evaluation 12/29/22    Authorization Type 1) Medicare A & B 2) BCBS    Progress Note Due on Visit 10    OT Start Time 1646    OT Stop Time 1730    OT Time Calculation (min) 44 min    Activity Tolerance Patient tolerated treatment well    Behavior During Therapy [facility] for tasks assessed/performed             Past Medical History:  Diagnosis Date   Anxiety    Depression    HTN (hypertension)    resolved with weight loss s/p gastric bypass   Hypothyroidism    Since at least 2007   Osteoporosis    RLS (restless legs syndrome)    Sleep apnea    Stress fracture of left shoulder    Stroke [facility])    2013/February 2017   Urticaria    Past Surgical History:  Procedure Laterality Date   BREAST BIOPSY Right    benign   CATARACT EXTRACTION Bilateral 2018   CHOLECYSTECTOMY     COLONOSCOPY  2004   internal hemorrhoids, left-sided diverticulae, splenic flexure polyp (57m), path unavailable at this time   COLONOSCOPY  04/2011   RMR: Internal and external hemorrhoids, rectal tubular adenoma removed, left-sided diverticulosis   COLONOSCOPY N/A 06/01/2016   Procedure: COLONOSCOPY;  Surgeon: RDaneil Dolin MD;  [facility];  Service: Endoscopy;  Laterality: N/A;  0830   COLONOSCOPY WITH PROPOFOL N/A 07/06/2022   Procedure: COLONOSCOPY WITH PROPOFOL;  Surgeon: RDaneil Dolin MD;  [facility];  Service: Endoscopy;  Laterality: N/A;  10:15am   ESOPHAGOGASTRODUODENOSCOPY  2004   small hiatal hernia   GASTRIC ROUX-EN-Y  2006   REVERSE SHOULDER ARTHROPLASTY Left 09/29/2021   Procedure: LEFT REVERSE SHOULDER ARTHROPLASTY;  Surgeon:  CMordecai Rasmussen MD;  Location: [facility];  Service: Orthopedics;  Laterality: Left;   Patient Active Problem List   Diagnosis Date Noted   Palpitations 04/14/2022   Tachycardia 04/14/2022   Glenohumeral arthritis, left 09/29/2021   Intolerance of continuous positive airway pressure (CPAP) ventilation 02/17/2021   Status post bariatric surgery 02/17/2021   Periodic limb movement disorder (PLMD) 02/17/2021   OSA (obstructive sleep apnea) 02/17/2021   RLS (restless legs syndrome) 01/22/2018   History of colonic polyps    Constipation 05/11/2016   Gait difficulty 01/04/2016   Aphasia 01/04/2016   [place]   CVA (cerebral infarction) 01/04/2016   Ataxia    OSA on CPAP 12/02/2013   CTS (carpal tunnel syndrome) 03/18/2013   Wrist fracture 02/04/2013   Distal radius fracture 12/26/2012   Hyperlipidemia 06/30/2012   Elevated blood pressure reading without diagnosis of hypertension 06/29/2012   [place]   Hypothyroidism 06/28/2012   Anxiety 06/28/2012   Personal history of colonic polyps 04/26/2011   Family history of colonic polyps 04/26/2011   ARTHRITIS, LEFT KNEE 06/06/2010   DERANGEMENT OF POSTERIOR HORN OF MEDIAL MENISCUS 06/06/2010   ANEMIA, B12 DEFICIENCY 02/07/2010   TRIGGER FINGER, THUMB 02/07/2010   SHOULDER PAIN 10/14/2008   BURSITIS, SHOULDER 10/14/2008  TRIGGER FINGER 06/15/2008    ONSET DATE: 02/22/22  REFERRING DIAG: Left shoulder pain  THERAPY DIAG:  Stiffness of left shoulder, not elsewhere classified  Acute pain of left shoulder  Other symptoms and signs involving the musculoskeletal system  Rationale for Evaluation and Treatment: Rehabilitation  SUBJECTIVE:   SUBJECTIVE STATEMENT: S: "I'm so happy I can start moving my arm more"  PERTINENT HISTORY: Pt is a 76 y/o female s/p left reverse TSA on 09/29/21 who received OT at this clinic from 10/06/21 to 12/30/21. Pt reports shortly after finishing  therapy her arm began hurting and she had developed a stress fracture in her left shoulder. Pt was in a sling for a few weeks, did some gentle home exercises, and the pain stopped but her ROM has decreased.   PRECAUTIONS: None  WEIGHT BEARING RESTRICTIONS: No  PAIN:  Are you having pain? No  FALLS: Has patient fallen in last 6 months? No  PLOF: Independent  PATIENT GOALS: To regain my ROM and strength to use this arm better.   OBJECTIVE:   HAND DOMINANCE: Right  ADLs: Overall ADLs: Pt is having difficulty with reaching forward into cabinets and onto shelves, reaching behind her back for dressing and hygiene. Pt reports LUE is weaker than the RUE, impacting meal preparation and housekeeping.    FUNCTIONAL OUTCOME MEASURES: FOTO: 56/100  UPPER EXTREMITY ROM:     Passive ROM Left eval  Shoulder flexion 145  Shoulder abduction 140  Shoulder internal rotation 90  Shoulder external rotation 45  (Blank rows = not tested)   Active ROM Left eval  Shoulder flexion 86  Shoulder abduction 85  Shoulder internal rotation 90  Shoulder external rotation 29  (Blank rows = not tested)   UPPER EXTREMITY MMT:     MMT Left eval  Shoulder flexion 3+/5  Shoulder abduction 4-/5  Shoulder internal rotation 4+/5  Shoulder external rotation 4-/5  (Blank rows = not tested)   COGNITION: Overall cognitive status: Within functional limits for tasks assessed   TODAY'S TREATMENT:                                                                                                                              DATE:  11/22/22:  -AA/ROM: standing, flexion, abduction, protraction, horizontal abduction, er/IR, x10 -A/ROM: standing, flexion, abduction, protraction, horizontal abduction, er/IR, x10 -Wall Slides: flexion and abduction, x10 each -Pulleys: flexion and abduction, x60" each -Scapular Strengthening: red theraband, extension, retraction, protraction, rows, x10   PATIENT  EDUCATION: Education details: A/ROM Person educated: Patient Education method: Consulting civil engineer, Media planner, and Handouts Education comprehension: verbalized understanding and returned demonstration  HOME EXERCISE PROGRAM: Eval: AA/ROM 12/26: A/ROM  GOALS: Goals reviewed with patient? Yes  SHORT TERM GOALS: Target date: 12/17/22  Pt will be provided with and educated on HEP to improve mobility required for ADL completion using LUE as non-dominant.   Goal status: IN PROGRESS  2.  Pt will increase LUE A/ROM  by at least 30 degrees to improve ability to reach overhead and behind back during dressing and bathing tasks.   Goal status: IN PROGRESS  3.  Pt will increase strength in LUE to 4/5 or greater to improve ability to carry and lift items during meal preparation or housekeeping tasks.   Goal status: IN PROGRESS  4.  Pt will increase activity tolerance by completing housework tasks without requiring any rest breaks using LUE as assist.   Goal status: IN PROGRESS  5.  Pt will maintain low pain level of 2/10 or less to enable continued functional reaching and active use of LUE during functional task completion.   Goal status: IN PROGRESS  6.  Pt will decrease LUE fascial restrictions to minimal amounts or less to improve ability to perform functional reaching tasks.   Goal status: IN PROGRESS    ASSESSMENT:  CLINICAL IMPRESSION: Patient presenting to therapy today with minimal pain and incrementally improving ROM. She tolerated AA/ROM seated well with near full ROM. With A/ROM she is able to reach approximately 65% of full ROM with minimal pain. This session therapist started scapular strengthening, to initiate good movement pattern and begin low level strengthening of the shoulder blades to continue lessening the stress along the shoulder joint itself. Therapist providing verbal cuing for positioning and technique.     PLAN:  OT FREQUENCY: 1x/week  OT DURATION: 6  weeks  PLANNED INTERVENTIONS: self care/ADL training, therapeutic exercise, therapeutic activity, neuromuscular re-education, manual therapy, passive range of motion, splinting, electrical stimulation, ultrasound, moist heat, cryotherapy, patient/family education, and DME and/or AE instructions  CONSULTED AND AGREED WITH PLAN OF CARE: Patient  PLAN FOR NEXT SESSION: Follow up on HEP, scapular strengthening, proximal shoulder strengthening, A/ROM progressing to strengthening   Paulita Fujita, OTR/L  [phone] 11/22/2022, 4:52 PM

## 2022-11-22 NOTE — Patient Instructions (Signed)

## 2022-11-24 ENCOUNTER — Ambulatory Visit (INDEPENDENT_AMBULATORY_CARE_PROVIDER_SITE_OTHER): Payer: Medicare Other | Admitting: Gastroenterology

## 2022-11-24 ENCOUNTER — Encounter: Payer: Self-pay | Admitting: Gastroenterology

## 2022-11-24 VITALS — BP 108/69 | HR 81 | Temp 97.9°F | Ht 63.0 in | Wt 168.0 lb

## 2022-11-24 DIAGNOSIS — R748 Abnormal levels of other serum enzymes: Secondary | ICD-10-CM

## 2022-11-24 DIAGNOSIS — I639 Cerebral infarction, unspecified: Secondary | ICD-10-CM

## 2022-11-24 DIAGNOSIS — K5904 Chronic idiopathic constipation: Secondary | ICD-10-CM | POA: Diagnosis not present

## 2022-11-24 NOTE — Progress Notes (Signed)
GI Office Note    Referring Provider: Redmond School, MD Primary Care Physician:  Redmond School, MD  Primary Gastroenterologist: Garfield Cornea, MD   Chief Complaint   Chief Complaint  Patient presents with   Follow-up    Wants to discuss why her alk phosphatase is elevated.    History of Present Illness   Virginia [name] is a 75 y.o. female presenting today for follow-up.  Last seen in August 2023. Ten year history of elevated alkaline phosphatase more recently with pruritus which is severe at times.   Recent AMA negative.  Echogenic liver on ultrasound.  Common bile duct 5 mm.  No alcohol use.  No chronic liver disease in the family.  Does not use herbal remedies.  History of Roux-en-Y gastric bypass for weight loss several years ago, lost over 80 pounds.  History of autoimmune thyroiditis.  She was started on cholestyramine 4 g daily at last office visit.  Recommended calcium supplement daily, vitamin D,E,A,K OTC daily. Recommended repeat DEXA scan in 1-2 years.     Labs in August 2023: GGT 10, ALT 33, AST 30, total bilirubin 0.4, alkaline phosphatase 200.  IgG4, 25.  Component     Latest Ref Rng 09/03/2018 11/01/2021 07/25/2022 11/17/2022  Albumin     3.8 - 4.8 g/dL 3.5  3.8  4.3  4.2   AST     0 - 40 IU/L _0 ALT     0 - 32 IU/L 20  15  33 (H)  23   Alkaline Phosphatase     44 - 121 IU/L 154 (H)  262 (H)  200 (H)  200 (H)   Total Bilirubin     0.0 - 1.2 mg/dL 0.6  0.2  0.4  0.3     MRCP September 2023: Common bile duct 7 mm.  No evidence of choledocholithiasis or biliary obstruction.  Liver biopsy September 2023: Showed only fatty liver, no evidence of PSC or PBC.  A. LIVER, RIGHT, NEEDLE CORE BIOPSY: - Liver parenchyma with mild steatosis. See comment - No pathologic fibrosis COMMENT:  The biopsy shows liver with a well-preserved architecture. Overall, portal tracts alternate fairly regularly with central veins and normally radiating  hepatocellular trabeculae. Portal tracts are compact and are generally devoid of an inflammatory cell infiltrate. Bile ducts are seen in nearly all portal tracts and are histologically unremarkable. Portal vascular structures are histologically unremarkable. Hepatic lobules show mild, mostly large droplet macrovesicular steatosis ( 5-10%) without evidence of steatohepatitis.  There is no cholestasis or significant inflammation. Central veins are unremarkable. Trichrome and reticulin stains show no evidence of pathologic fibrosis. Iron stain shows no stainable iron. PASD stain is negative for globular intracytoplasmic inclusions.  Colonoscopy 06/2022: diverticulosis.  Today: Questran most days, sometimes finds it hard to fit it in because restrictions of taking with other medications. Miracle for her itching. Chronic constipation, did not feel like Lucrezia Starch has made it worse. Now off benadryl for the most part. Taking some magnesium and two stool softeners per day. Small stools 1-2 times per day.  Stools are softer than before but still remain hard. Has to provide external manipulation to help with defecation with history of pelvic floor prolapse. Has not tried miralax or fiber supplement. No UGI symptoms.    Patient tells me she is actually been itching for 3 years but symptoms have been progressively worsened over the past year.  She is known about  her elevated alkaline phosphatase for years and has been requesting evaluation by PCP just recently able to get referral for this.    Patient reports 30 pound weight loss in the past 6 months.  States she stopped drinking half a gallon of [employer] sweet tea prior to onset of weight loss.  Weight has stabilized over the past month or so and actually gained 2 pounds after she started drinking sweet tea again.  Medications   Current Outpatient Medications  Medication Sig Dispense Refill   ALPRAZolam (XANAX) 0.5 MG tablet Take 0.5 mg by mouth See  admin instructions. Take 0.5 mg at bedtime, may take 0.5 mg dose 2 times daily as needed for anxiety     amLODipine (NORVASC) 2.5 MG tablet Take 2.5 mg by mouth daily.     Ascorbic Acid (VITAMIN C) 1000 MG tablet Take 1,000 mg by mouth daily.     aspirin EC 81 MG tablet Take 81 mg by mouth daily.     atorvastatin (LIPITOR) 10 MG tablet Take 1 tablet (10 mg total) by mouth daily. (Patient taking differently: Take 10 mg by mouth every evening.) 30 tablet 1   B Complex-C (SUPER B COMPLEX PO) Take 1 tablet by mouth daily.     Cholecalciferol (VITAMIN D) 50 MCG (2000 UT) CAPS Take 2,000 Units by mouth daily.     cholestyramine (QUESTRAN) 4 g packet Take 1 packet (4 g total) by mouth daily. 30 each 11   diphenhydrAMINE (BENADRYL) 12.5 MG/5ML elixir Take 12.5-25 mg by mouth 4 (four) times daily as needed for allergies.     diphenhydrAMINE (BENADRYL) 25 MG tablet Take 25 mg by mouth daily as needed for allergies.     DULoxetine (CYMBALTA) 60 MG capsule Take 60 mg by mouth daily.     ipratropium (ATROVENT) 0.03 % nasal spray Place 2 sprays into both nostrils 3 (three) times daily as needed for rhinitis. 30 mL 3   losartan (COZAAR) 100 MG tablet TAKE 1 TABLET BY MOUTH EVERY DAY 30 tablet 2   Multiple Vitamins-Minerals (ICAPS AREDS 2 PO) Take 1 capsule by mouth in the morning and at bedtime.     Multiple Vitamins-Minerals (MULTIVITAMIN WITH MINERALS) tablet Take 1 tablet by mouth daily.     rOPINIRole (REQUIP) 0.5 MG tablet TAKE 1 TABLET BY MOUTH AT BEDTIME. 90 tablet 1   SYNTHROID 150 MCG tablet Take 150 mcg by mouth daily before breakfast.     No current facility-administered medications for this visit.    Allergies   Allergies as of 11/24/2022 - Review Complete 11/24/2022  Allergen Reaction Noted   Augmentin [amoxicillin-pot clavulanate] Anaphylaxis 09/27/2021         Review of Systems   General: Negative for anorexia, unintentional weight loss, fever, chills, fatigue, weakness.itching  improved ENT: Negative for hoarseness, difficulty swallowing , nasal congestion. CV: Negative for chest pain, angina, palpitations, dyspnea on exertion, peripheral edema.  Respiratory: Negative for dyspnea at rest, dyspnea on exertion, cough, sputum, wheezing.  GI: See history of present illness. GU:  Negative for dysuria, hematuria, urinary incontinence, urinary frequency, nocturnal urination.  Endo: Negative for unusual weight change.     Physical Exam   BP 108/69 (BP Location: Right Arm, Patient Position: Sitting, Cuff Size: Normal)   Pulse 81   Temp 97.9 F (36.6 C) (Oral)   Ht _0  (1.6 m)   Wt 168 lb (76.2 kg)   SpO2 98%   BMI 29.76 kg/m    General: Well-nourished, well-developed  in no acute distress.  Eyes: No icterus. Mouth: Oropharyngeal mucosa moist and pink   Abdomen: Bowel sounds are normal, nontender, nondistended, no hepatosplenomegaly or masses,  no abdominal bruits or hernia , no rebound or guarding.  Rectal: not performed  Extremities: No lower extremity edema. No clubbing or deformities. Neuro: Alert and oriented x 4   Skin: Warm and dry, no jaundice.   Psych: Alert and cooperative, normal mood and affect.  Labs   Lab Results  Component Value Date   CREATININE 1.04 (H) 11/01/2021   BUN 16 11/01/2021   NA 142 11/01/2021   K 4.5 11/01/2021   CL 105 11/01/2021   CO2 22 11/01/2021   Lab Results  Component Value Date   ALT 23 11/17/2022   AST 23 11/17/2022   GGT 10 07/25/2022   ALKPHOS 200 (H) 11/17/2022   BILITOT 0.3 11/17/2022   Lab Results  Component Value Date   WBC 6.3 08/25/2022   HGB 11.6  08/25/2022   HCT 37.9 08/25/2022   MCV 91.1 08/25/2022   PLT 283 08/25/2022    Imaging Studies   DG Shoulder Left  Result Date: 11/02/2022 X-rays of the left shoulder were obtained in clinic today.  No acute injuries noted.  Well-positioned left shoulder reverse arthroplasty.  No surrounding lucency.  No subsidence of the implants.  No evidence of  recent fracture, or healed acromial stress fracture.  Glenohumeral joint is reduced.  Impression : Left reverse shoulder arthroplasty in excellent position without subsidence    Assessment/Plan:   Chronic constipation: slightly worsened on cholestyramine. Colonoscopy up to date. She can continue magnesium supplement and stool softeners.  Try adding fiber supplement 3 to 4 g daily preferably in a chewable tablet or gummy.  Benefiber or Fiber Choice good options.  She does not want to try any other liquid forms of medications.  If these are not effective then other choices would be MiraLAX 1 capful daily or prescription option.  Chronically elevated alkaline phosphatase: dating back at least 10 years. In 2013, AP 129. Six years ago AP 174. Four years ago AP 154. In 10/2021, AP 262 (she had shoulder replacement in 09/2021). Most recently AP 200. GGT normal. Noted pruritus for 3 years worse over the past one year. Has responded nicely to cholestyramine. AMA negative. Liver biopsy showed mild steatosis. No cholestasis seen. Portal tracts/bile ducts unremarkable. MRCP unrevealing.   Based on negative work up, would suspect elevated AP due to non-liver source, possibly bone. It is interesting that her pruritus of 3 years responded nicely to [place]. Query trial of ursodiol to see if AP responds. Would also consider referral to determine if bone source contributing. Will discuss with Dr. Gala Romney. Will also run case by hepatology colleagues at [facility].     Laureen Ochs. Bobby Rumpf, Page, [facility]

## 2022-11-24 NOTE — Patient Instructions (Addendum)
For constipation: you can try adding fiber supplement 3-4 grams daily. Prefer form such as chewable tablet or gummy. Benefiber and Fiberchoice make good options. If you still have issues with hard infrequent stools, you can cut back on cholestyramine to every other day to see if you can lessen effects of constipation but still control itching. Miralax one capful daily is also available over the counter for constipation (it is a powder which is a stronger stool softener than the pills you are currently on). I will discuss your case further with Dr. Gala Romney. Anticipate referral to endocrinology to rule out bone source for your elevated alkaline phosphatase. I will discuss possibility of trial of ursodiol as well.

## 2022-11-28 ENCOUNTER — Encounter (HOSPITAL_COMMUNITY): Payer: Self-pay | Admitting: Occupational Therapy

## 2022-11-28 ENCOUNTER — Telehealth: Payer: Self-pay | Admitting: Gastroenterology

## 2022-11-28 ENCOUNTER — Ambulatory Visit (HOSPITAL_COMMUNITY): Payer: Medicare Other | Attending: Orthopedic Surgery | Admitting: Occupational Therapy

## 2022-11-28 ENCOUNTER — Other Ambulatory Visit: Payer: Self-pay

## 2022-11-28 ENCOUNTER — Other Ambulatory Visit: Payer: Self-pay | Admitting: *Deleted

## 2022-11-28 DIAGNOSIS — M25612 Stiffness of left shoulder, not elsewhere classified: Secondary | ICD-10-CM | POA: Insufficient documentation

## 2022-11-28 DIAGNOSIS — M25512 Pain in left shoulder: Secondary | ICD-10-CM | POA: Insufficient documentation

## 2022-11-28 DIAGNOSIS — R29898 Other symptoms and signs involving the musculoskeletal system: Secondary | ICD-10-CM | POA: Insufficient documentation

## 2022-11-28 DIAGNOSIS — R748 Abnormal levels of other serum enzymes: Secondary | ICD-10-CM

## 2022-11-28 DIAGNOSIS — R7989 Other specified abnormal findings of blood chemistry: Secondary | ICD-10-CM

## 2022-11-28 NOTE — Telephone Encounter (Addendum)
Please let patient know that Dr. Gala Romney is advising that we check a serum bile salts level AND to proceed with endocrinology referral for "work up of elevated alkaline phosphatase suspected bone source".   Please arrange following at [place] number, [phone], Fractionated and Total Bile Acids Also get  alkaline phosphatase, isoenzymes.   Please refer to endocrinology  Dr. Cruzita Lederer at [facility].   "work up of elevated alkaline phosphatase suspected bone source".

## 2022-11-28 NOTE — Therapy (Signed)
OUTPATIENT OCCUPATIONAL THERAPY ORTHO TREATMENT NOTE  Patient Name: Virginia [name] MRN: [id number] DOB:[birth year], 77 y.o., female Today's Date: 11/28/2022  PCP: Dr. Redmond School REFERRING PROVIDER: Dr. Larena Glassman  END OF SESSION:  OT End of Session - 11/28/22 0946     Visit Number 3    Number of Visits 6    Date for OT Re-Evaluation 12/29/22    Authorization Type 1) Medicare A & B 2) BCBS    Progress Note Due on Visit 10    OT Start Time 0904    OT Stop Time 0946    OT Time Calculation (min) 42 min    Activity Tolerance Patient tolerated treatment well    Behavior During Therapy [facility] for tasks assessed/performed              Past Medical History:  Diagnosis Date   Anxiety    Depression    HTN (hypertension)    resolved with weight loss s/p gastric bypass   Hypothyroidism    Since at least 2007   Osteoporosis    RLS (restless legs syndrome)    Sleep apnea    Stress fracture of left shoulder    Stroke [facility])    2013/February 2017   Urticaria    Past Surgical History:  Procedure Laterality Date   BREAST BIOPSY Right    benign   CATARACT EXTRACTION Bilateral 2018   CHOLECYSTECTOMY     COLONOSCOPY  2004   internal hemorrhoids, left-sided diverticulae, splenic flexure polyp (56m), path unavailable at this time   COLONOSCOPY  04/2011   RMR: Internal and external hemorrhoids, rectal tubular adenoma removed, left-sided diverticulosis   COLONOSCOPY N/A 06/01/2016   Procedure: COLONOSCOPY;  Surgeon: RDaneil Dolin MD;  Location: [facility] ENDO SUITE;  Service: Endoscopy;  Laterality: N/A;  0830   COLONOSCOPY WITH PROPOFOL N/A 07/06/2022   Procedure: COLONOSCOPY WITH PROPOFOL;  Surgeon: RDaneil Dolin MD;  Location: [facility] ENDO SUITE;  Service: Endoscopy;  Laterality: N/A;  10:15am   ESOPHAGOGASTRODUODENOSCOPY  2004   small hiatal hernia   GASTRIC ROUX-EN-Y  2006   REVERSE SHOULDER ARTHROPLASTY Left 09/29/2021   Procedure: LEFT REVERSE SHOULDER ARTHROPLASTY;  Surgeon:  CMordecai Rasmussen MD;  Location: [facility] ORS;  Service: Orthopedics;  Laterality: Left;   Patient Active Problem List   Diagnosis Date Noted   Elevated alkaline phosphatase level 11/24/2022   Palpitations 04/14/2022   Tachycardia 04/14/2022   Glenohumeral arthritis, left 09/29/2021   Intolerance of continuous positive airway pressure (CPAP) ventilation 02/17/2021   Status post bariatric surgery 02/17/2021   Periodic limb movement disorder (PLMD) 02/17/2021   OSA (obstructive sleep apnea) 02/17/2021   RLS (restless legs syndrome) 01/22/2018   History of colonic polyps    Constipation 05/11/2016   Gait difficulty 01/04/2016   Aphasia 01/04/2016   Acute [place] 01/04/2016   CVA (cerebral infarction) 01/04/2016   Ataxia    OSA on CPAP 12/02/2013   CTS (carpal tunnel syndrome) 03/18/2013   Wrist fracture 02/04/2013   Distal radius fracture 12/26/2012   Hyperlipidemia 06/30/2012   Elevated blood pressure reading without diagnosis of hypertension 06/29/2012   [place] [place] 06/28/[place]   Hypothyroidism 06/28/2012   Anxiety 06/28/2012   Personal history of colonic polyps 04/26/2011   Family history of colonic polyps 04/26/2011   ARTHRITIS, LEFT KNEE 06/06/2010   DERANGEMENT OF POSTERIOR HORN OF MEDIAL MENISCUS 06/06/2010   ANEMIA, B12 DEFICIENCY 02/07/2010   TRIGGER FINGER, THUMB 02/07/2010   SHOULDER PAIN  10/14/2008   BURSITIS, SHOULDER 10/14/2008   TRIGGER FINGER 06/15/2008    ONSET DATE: 02/22/22  REFERRING DIAG: Left shoulder pain  THERAPY DIAG:  Stiffness of left shoulder, not elsewhere classified  Acute pain of left shoulder  Other symptoms and signs involving the musculoskeletal system  Rationale for Evaluation and Treatment: Rehabilitation  SUBJECTIVE:   SUBJECTIVE STATEMENT: S: "My ROM is better except for putting my arm behind my back."  PERTINENT HISTORY: Pt is a 77 y/o female s/p left reverse TSA on 09/29/21 who received OT at this clinic  from 10/06/21 to 12/30/21. Pt reports shortly after finishing therapy her arm began hurting and she had developed a stress fracture in her left shoulder. Pt was in a sling for a few weeks, did some gentle home exercises, and the pain stopped but her ROM has decreased.   PRECAUTIONS: None  WEIGHT BEARING RESTRICTIONS: No  PAIN:  Are you having pain? No  FALLS: Has patient fallen in last 6 months? No  PLOF: Independent  PATIENT GOALS: To regain my ROM and strength to use this arm better.   OBJECTIVE:   HAND DOMINANCE: Right  ADLs: Overall ADLs: Pt is having difficulty with reaching forward into cabinets and onto shelves, reaching behind her back for dressing and hygiene. Pt reports LUE is weaker than the RUE, impacting meal preparation and housekeeping.    FUNCTIONAL OUTCOME MEASURES: FOTO: 56/100  UPPER EXTREMITY ROM:     Passive ROM Left eval  Shoulder flexion 145  Shoulder abduction 140  Shoulder internal rotation 90  Shoulder external rotation 45  (Blank rows = not tested)   Active ROM Left eval  Shoulder flexion 86  Shoulder abduction 85  Shoulder internal rotation 90  Shoulder external rotation 29  (Blank rows = not tested)   UPPER EXTREMITY MMT:     MMT Left eval  Shoulder flexion 3+/5  Shoulder abduction 4-/5  Shoulder internal rotation 4+/5  Shoulder external rotation 4-/5  (Blank rows = not tested)   COGNITION: Overall cognitive status: Within functional limits for tasks assessed   TODAY'S TREATMENT:                                                                                                                              DATE:  11/28/22 -Myofascial release: myofascial release to address facial restrictions in left upper arm, anterior shoulder, trapezius, and scapular regions to decrease pain and increase joint ROM -A/ROM: supine: flexion, abduction, protraction, horizontal abduction, er/IR, x10 -Shoulder stretches: flexion, abduction, er, IR  behind back with horizontal towel, 3x20" holds -Cone pass behind back from left to right working on IR, 10 reps -A/ROM: standing: flexion, abduction, protraction, horizontal abduction, er/IR, x10 -Scapular Strengthening: red theraband, extension, retraction, protraction, rows, x10 -Ball on wall: 1' flexion, 40" abduction -UBE: level 1, 2' forward, 2' reverse, pace: 5.0  11/22/22:  -AA/ROM: standing, flexion, abduction, protraction, horizontal abduction, er/IR, x10 -A/ROM: standing, flexion, abduction, protraction,  horizontal abduction, er/IR, x10 -Wall Slides: flexion and abduction, x10 each -Pulleys: flexion and abduction, x60" each -Scapular Strengthening: red theraband, extension, retraction, protraction, rows, x10   PATIENT EDUCATION: Education details: IR stretch behind back, horizontal towel Person educated: Patient Education method: Explanation, Demonstration, and Handouts Education comprehension: verbalized understanding and returned demonstration  HOME EXERCISE PROGRAM: Eval: AA/ROM 12/26: A/ROM 11/28/22: IR stretch behind back, horizontal towel  GOALS: Goals reviewed with patient? Yes  SHORT TERM GOALS: Target date: 12/17/22  Pt will be provided with and educated on HEP to improve mobility required for ADL completion using LUE as non-dominant.   Goal status: IN PROGRESS  2.  Pt will increase LUE A/ROM by at least 30 degrees to improve ability to reach overhead and behind back during dressing and bathing tasks.   Goal status: IN PROGRESS  3.  Pt will increase strength in LUE to 4/5 or greater to improve ability to carry and lift items during meal preparation or housekeeping tasks.   Goal status: IN PROGRESS  4.  Pt will increase activity tolerance by completing housework tasks without requiring any rest breaks using LUE as assist.   Goal status: IN PROGRESS  5.  Pt will maintain low pain level of 2/10 or less to enable continued functional reaching and active use  of LUE during functional task completion.   Goal status: IN PROGRESS  6.  Pt will decrease LUE fascial restrictions to minimal amounts or less to improve ability to perform functional reaching tasks.   Goal status: IN PROGRESS    ASSESSMENT:  CLINICAL IMPRESSION: Patient reports she is doing well, just has difficulty with IR behind the back. Completed myofascial release today with good vasomotor response. Continued with A/ROM, added shoulder stretches, continue with scapular strengthening, and added ball on wall. Pt with mod fatigue during ball on wall, moderate instability noted during exercise with difficulty keeping ball in the same area, terminated abduction due to pain and fatigue. Verbal cuing for form and technique.    PLAN:  OT FREQUENCY: 1x/week  OT DURATION: 6 weeks  PLANNED INTERVENTIONS: self care/ADL training, therapeutic exercise, therapeutic activity, neuromuscular re-education, manual therapy, passive range of motion, splinting, electrical stimulation, ultrasound, moist heat, cryotherapy, patient/family education, and DME and/or AE instructions  CONSULTED AND AGREED WITH PLAN OF CARE: Patient  PLAN FOR NEXT SESSION: Follow up on HEP, scapular strengthening, proximal shoulder strengthening, A/ROM progressing to strengthening   Guadelupe Sabin, OTR/L  [phone] 11/28/2022, 9:47 AM

## 2022-11-28 NOTE — Telephone Encounter (Signed)
Referral sent 

## 2022-11-28 NOTE — Patient Instructions (Signed)
1) Towel Stretch with Internal Rotation      Gently pull up (or to the side) your affected arm  behind your back with the assist of a towel. Hold 10 seconds, repeat 3-5 times. 1-2 times/day.

## 2022-11-28 NOTE — Telephone Encounter (Signed)
Pt was made aware and verbalized understanding.  

## 2022-11-29 DIAGNOSIS — R748 Abnormal levels of other serum enzymes: Secondary | ICD-10-CM | POA: Diagnosis not present

## 2022-11-29 DIAGNOSIS — R7989 Other specified abnormal findings of blood chemistry: Secondary | ICD-10-CM | POA: Diagnosis not present

## 2022-11-29 NOTE — Addendum Note (Signed)
Addended by: Orland Jarred on: 11/29/2022 01:47 PM   Modules accepted: Orders

## 2022-11-30 DIAGNOSIS — G243 Spasmodic torticollis: Secondary | ICD-10-CM | POA: Diagnosis not present

## 2022-11-30 DIAGNOSIS — Z6829 Body mass index (BMI) 29.0-29.9, adult: Secondary | ICD-10-CM | POA: Diagnosis not present

## 2022-11-30 DIAGNOSIS — E6609 Other obesity due to excess calories: Secondary | ICD-10-CM | POA: Diagnosis not present

## 2022-12-01 LAB — ALKALINE PHOSPHATASE, ISOENZYMES
Alkaline Phosphatase: 198 IU/L — ABNORMAL HIGH (ref 44–121)
BONE FRACTION: 39 % (ref 14–68)
INTESTINAL FRAC.: 5 % (ref 0–18)
LIVER FRACTION: 56 % (ref 18–85)

## 2022-12-01 LAB — BILE ACIDS, TOTAL: Bile Acids Total: 8.1 umol/L (ref 0.0–10.0)

## 2022-12-06 LAB — BILE ACIDS, FRACTIONATED LCMS
Chenodeoxycholic Acids: 3 umol/L
Cholic Acids: 0.7 umol/L
Deoxycholic Acids: 2.8 umol/L
Total Bile Acids: 6.8 umol/L
Ursodeoxycholic Acids: 0.3 umol/L

## 2022-12-06 LAB — SPECIMEN STATUS REPORT

## 2022-12-07 ENCOUNTER — Encounter (HOSPITAL_COMMUNITY): Payer: Medicare Other | Admitting: Occupational Therapy

## 2022-12-11 ENCOUNTER — Ambulatory Visit: Payer: Medicare Other | Admitting: Gastroenterology

## 2022-12-13 ENCOUNTER — Encounter (HOSPITAL_COMMUNITY): Payer: Medicare Other | Admitting: Occupational Therapy

## 2022-12-15 ENCOUNTER — Ambulatory Visit (HOSPITAL_COMMUNITY): Payer: Medicare Other | Admitting: Occupational Therapy

## 2022-12-15 ENCOUNTER — Encounter (HOSPITAL_COMMUNITY): Payer: Self-pay | Admitting: Occupational Therapy

## 2022-12-15 DIAGNOSIS — M25612 Stiffness of left shoulder, not elsewhere classified: Secondary | ICD-10-CM

## 2022-12-15 DIAGNOSIS — R29898 Other symptoms and signs involving the musculoskeletal system: Secondary | ICD-10-CM | POA: Diagnosis not present

## 2022-12-15 DIAGNOSIS — M25512 Pain in left shoulder: Secondary | ICD-10-CM | POA: Diagnosis not present

## 2022-12-15 NOTE — Therapy (Signed)
OUTPATIENT OCCUPATIONAL THERAPY ORTHO TREATMENT NOTE  Patient Name: Virginia [name] MRN: [id number] DOB:09-May-1946, 77 y.o., female Today's Date: 12/15/2022  PCP: Dr. Redmond School REFERRING PROVIDER: Dr. Larena Glassman  END OF SESSION:  OT End of Session - 12/15/22 1529     Visit Number 4    Number of Visits 6    Date for OT Re-Evaluation 12/29/22    Authorization Type 1) Medicare A & B 2) BCBS    Progress Note Due on Visit 10    OT Start Time 1345    OT Stop Time 1426    OT Time Calculation (min) 41 min    Activity Tolerance Patient tolerated treatment well    Behavior During Therapy WFL for tasks assessed/performed               Past Medical History:  Diagnosis Date   Anxiety    Depression    HTN (hypertension)    resolved with weight loss s/p gastric bypass   Hypothyroidism    Since at least 2007   Osteoporosis    RLS (restless legs syndrome)    Sleep apnea    Stress fracture of left shoulder    Stroke [facility])    2013/February 2017   Urticaria    Past Surgical History:  Procedure Laterality Date   BREAST BIOPSY Right    benign   CATARACT EXTRACTION Bilateral 2018   CHOLECYSTECTOMY     COLONOSCOPY  2004   internal hemorrhoids, left-sided diverticulae, splenic flexure polyp (74m), path unavailable at this time   COLONOSCOPY  04/2011   RMR: Internal and external hemorrhoids, rectal tubular adenoma removed, left-sided diverticulosis   COLONOSCOPY N/A 06/01/2016   Procedure: COLONOSCOPY;  Surgeon: RDaneil Dolin MD;  Location: [facility];  Service: Endoscopy;  Laterality: N/A;  0830   COLONOSCOPY WITH PROPOFOL N/A 07/06/2022   Procedure: COLONOSCOPY WITH PROPOFOL;  Surgeon: RDaneil Dolin MD;  Location: [facility];  Service: Endoscopy;  Laterality: N/A;  10:15am   ESOPHAGOGASTRODUODENOSCOPY  2004   small hiatal hernia   GASTRIC ROUX-EN-Y  2006   REVERSE SHOULDER ARTHROPLASTY Left 09/29/2021   Procedure: LEFT REVERSE SHOULDER ARTHROPLASTY;   Surgeon: CMordecai Rasmussen MD;  Location: [facility] OR[facility];  Service: Orthopedics;  Laterality: Left;   Patient Active Problem List   Diagnosis Date Noted   Elevated alkaline phosphatase level 11/24/2022   Palpitations 04/14/2022   Tachycardia 04/14/2022   Glenohumeral arthritis, left 09/29/2021   Intolerance of continuous positive airway pressure (CPAP) ventilation 02/17/2021   Status post bariatric surgery 02/17/2021   Periodic limb movement disorder (PLMD) 02/17/2021   OSA (obstructive sleep apnea) 02/17/2021   RLS (restless legs syndrome) 01/22/2018   History of colonic polyps    Constipation 05/11/2016   Gait difficulty 01/04/2016   Aphasia 01/04/2016   [place]   CVA (cerebral infarction) 01/04/2016   Ataxia    OSA on CPAP 12/02/2013   CTS (carpal tunnel syndrome) 03/18/2013   Wrist fracture 02/04/2013   Distal radius fracture 12/26/2012   Hyperlipidemia 06/30/2012   Elevated blood pressure reading without diagnosis of hypertension 06/29/2012   [place]   Hypothyroidism 06/28/2012   Anxiety 06/28/2012   Personal history of colonic polyps 04/26/2011   Family history of colonic polyps 04/26/2011   ARTHRITIS, LEFT KNEE 06/06/2010   DERANGEMENT OF POSTERIOR HORN OF MEDIAL MENISCUS 06/06/2010   ANEMIA, B12 DEFICIENCY 02/07/2010   TRIGGER FINGER, THUMB 02/07/2010   SHOULDER  PAIN 10/14/2008   BURSITIS, SHOULDER 10/14/2008   TRIGGER FINGER 06/15/2008    ONSET DATE: 02/22/22  REFERRING DIAG: Left shoulder pain  THERAPY DIAG:  Stiffness of left shoulder, not elsewhere classified  Acute pain of left shoulder  Other symptoms and signs involving the musculoskeletal system  Rationale for Evaluation and Treatment: Rehabilitation  SUBJECTIVE:   SUBJECTIVE STATEMENT: S: "I haven't done my exercises like I should have."  PERTINENT HISTORY: Pt is a 77 y/o female s/p left reverse TSA on 09/29/21 who received OT at this clinic  from 10/06/21 to 12/30/21. Pt reports shortly after finishing therapy her arm began hurting and she had developed a stress fracture in her left shoulder. Pt was in a sling for a few weeks, did some gentle home exercises, and the pain stopped but her ROM has decreased.   PRECAUTIONS: None  WEIGHT BEARING RESTRICTIONS: No  PAIN:  Are you having pain? No  FALLS: Has patient fallen in last 6 months? No  PLOF: Independent  PATIENT GOALS: To regain my ROM and strength to use this arm better.   OBJECTIVE:   HAND DOMINANCE: Right  ADLs: Overall ADLs: Pt is having difficulty with reaching forward into cabinets and onto shelves, reaching behind her back for dressing and hygiene. Pt reports LUE is weaker than the RUE, impacting meal preparation and housekeeping.    FUNCTIONAL OUTCOME MEASURES: FOTO: 56/100  UPPER EXTREMITY ROM:     Passive ROM Left eval  Shoulder flexion 145  Shoulder abduction 140  Shoulder internal rotation 90  Shoulder external rotation 45  (Blank rows = not tested)   Active ROM Left eval  Shoulder flexion 86  Shoulder abduction 85  Shoulder internal rotation 90  Shoulder external rotation 29  (Blank rows = not tested)   UPPER EXTREMITY MMT:     MMT Left eval  Shoulder flexion 3+/5  Shoulder abduction 4-/5  Shoulder internal rotation 4+/5  Shoulder external rotation 4-/5  (Blank rows = not tested)   COGNITION: Overall cognitive status: Within functional limits for tasks assessed   TODAY'S TREATMENT:                                                                                                                              DATE:  12/15/22 -Myofascial release: myofascial release to address facial restrictions in left upper arm, anterior shoulder, trapezius, and scapular regions to decrease pain and increase joint ROM -Shoulder Strengthening: supine-flexion, abduction, protraction, horizontal abduction, er/IR, x12 -X to V arms: x12 -Overhead  lacing: pt seated and lacing from top down, then reversing to remove -Scapular theraband: red-row, extension, retraction, 10 reps -Theraband strengthening: red-flexion, er, protraction, 10 reps -UBE: Level 2, 3' forward 3' reverse, pace: 6.5  11/28/22 -Myofascial release: myofascial release to address facial restrictions in left upper arm, anterior shoulder, trapezius, and scapular regions to decrease pain and increase joint ROM -A/ROM: supine: flexion, abduction, protraction, horizontal abduction, er/IR, x10 -Shoulder stretches:  flexion, abduction, er, IR behind back with horizontal towel, 3x20" holds -Cone pass behind back from left to right working on IR, 10 reps -A/ROM: standing: flexion, abduction, protraction, horizontal abduction, er/IR, x10 -Scapular Strengthening: red theraband, extension, retraction, protraction, rows, x10 -Ball on wall: 1' flexion, 40" abduction -UBE: level 1, 2' forward, 2' reverse, pace: 5.0  11/22/22:  -AA/ROM: standing, flexion, abduction, protraction, horizontal abduction, er/IR, x10 -A/ROM: standing, flexion, abduction, protraction, horizontal abduction, er/IR, x10 -Wall Slides: flexion and abduction, x10 each -Pulleys: flexion and abduction, x60" each -Scapular Strengthening: red theraband, extension, retraction, protraction, rows, x10   PATIENT EDUCATION: Education details: Use water bottle or soup can for light weight and add to A/ROM Person educated: Patient Education method: Consulting civil engineer, Media planner, and Handouts Education comprehension: verbalized understanding and returned demonstration  HOME EXERCISE PROGRAM: Eval: AA/ROM 12/26: A/ROM 11/28/22: IR stretch behind back, horizontal towel 12/15/22: Shoulder strengthening with light weight  GOALS: Goals reviewed with patient? Yes  SHORT TERM GOALS: Target date: 12/17/22  Pt will be provided with and educated on HEP to improve mobility required for ADL completion using LUE as non-dominant.    Goal status: IN PROGRESS  2.  Pt will increase LUE A/ROM by at least 30 degrees to improve ability to reach overhead and behind back during dressing and bathing tasks.   Goal status: IN PROGRESS  3.  Pt will increase strength in LUE to 4/5 or greater to improve ability to carry and lift items during meal preparation or housekeeping tasks.   Goal status: IN PROGRESS  4.  Pt will increase activity tolerance by completing housework tasks without requiring any rest breaks using LUE as assist.   Goal status: IN PROGRESS  5.  Pt will maintain low pain level of 2/10 or less to enable continued functional reaching and active use of LUE during functional task completion.   Goal status: IN PROGRESS  6.  Pt will decrease LUE fascial restrictions to minimal amounts or less to improve ability to perform functional reaching tasks.   Goal status: IN PROGRESS    ASSESSMENT:  CLINICAL IMPRESSION: Patient reports her HEP is going well, continues to have difficulty getting her left arm behind her back. Continued with myofascial release, moderate sized muscle knot palpated at bicep region. Continued with passive stretching, pt tolerating ROM WFL. Progressed to strengthening in supine, added x to v arms and overhead lacing. Pt completed scapular theraband and shoulder strengthening, UBE. Pt with mod fatigue, intermittent rest breaks with strengthening tasks. Verbal cuing for form and technique during session.    PLAN:  OT FREQUENCY: 1x/week  OT DURATION: 6 weeks  PLANNED INTERVENTIONS: self care/ADL training, therapeutic exercise, therapeutic activity, neuromuscular re-education, manual therapy, passive range of motion, splinting, electrical stimulation, ultrasound, moist heat, cryotherapy, patient/family education, and DME and/or AE instructions  CONSULTED AND AGREED WITH PLAN OF CARE: Patient  PLAN FOR NEXT SESSION: Continue with strengthening, reassessment.    Guadelupe Sabin, OTR/L   [phone] 12/15/2022, 3:33 PM

## 2022-12-20 ENCOUNTER — Encounter (HOSPITAL_COMMUNITY): Payer: Medicare Other | Admitting: Occupational Therapy

## 2022-12-22 ENCOUNTER — Encounter (HOSPITAL_COMMUNITY): Payer: Medicare Other | Admitting: Occupational Therapy

## 2022-12-22 DIAGNOSIS — L900 Lichen sclerosus et atrophicus: Secondary | ICD-10-CM | POA: Diagnosis not present

## 2022-12-22 DIAGNOSIS — N898 Other specified noninflammatory disorders of vagina: Secondary | ICD-10-CM | POA: Diagnosis not present

## 2022-12-22 DIAGNOSIS — R300 Dysuria: Secondary | ICD-10-CM | POA: Diagnosis not present

## 2022-12-29 ENCOUNTER — Encounter (HOSPITAL_COMMUNITY): Payer: Medicare Other | Admitting: Occupational Therapy

## 2023-01-19 ENCOUNTER — Other Ambulatory Visit: Payer: Self-pay | Admitting: Internal Medicine

## 2023-01-19 ENCOUNTER — Encounter: Payer: Self-pay | Admitting: Internal Medicine

## 2023-01-19 ENCOUNTER — Ambulatory Visit (INDEPENDENT_AMBULATORY_CARE_PROVIDER_SITE_OTHER): Payer: Medicare Other | Admitting: Internal Medicine

## 2023-01-19 VITALS — BP 118/78 | HR 72 | Ht 63.0 in | Wt 165.4 lb

## 2023-01-19 DIAGNOSIS — E039 Hypothyroidism, unspecified: Secondary | ICD-10-CM

## 2023-01-19 DIAGNOSIS — R748 Abnormal levels of other serum enzymes: Secondary | ICD-10-CM

## 2023-01-19 DIAGNOSIS — M8589 Other specified disorders of bone density and structure, multiple sites: Secondary | ICD-10-CM

## 2023-01-19 NOTE — Progress Notes (Addendum)
Patient ID: Virginia [name], female   DOB: [birth year], 77 y.o.   MRN: [id number]  HPI  Virginia [name] is a 77 y.o.-year-old female, referred by her PCP, Dr. Gerarda Fraction, for evaluation and management of high alkaline phosphatase and, per patient's request, her hypothyroidism was also addressed today.  Patient c/o itching that started at ago years.  This year she was sent to a gastroenterologist (Dr. Gala Romney) >> started Cholestyramine. This helped significantly.  She had a liver U/S and a liver Bx >> no significant problems.  After this she was referred to endocrinology for further investigation for elevated alkaline phosphatase.  Pt has had high alkaline phosphatase levels for years: Lab Results  Component Value Date   ALKPHOS 198 (H) 11/29/2022   ALKPHOS 200 (H) 11/17/2022   ALKPHOS 200 (H) 07/25/2022   ALKPHOS 262 (H) 11/01/2021   ALKPHOS 154 (H) 09/03/2018   ALKPHOS 174 (H) 01/04/2016   ALKPHOS 129 (H) 06/28/2012  06/05/2022: Alkaline phosphatase 309  GGT and the rest of the LFTs were normal: Lab Results  Component Value Date   ALT 23 11/17/2022   AST 23 11/17/2022   GGT 10 07/25/2022   ALKPHOS 198 (H) 11/29/2022   BILITOT 0.3 11/17/2022   Component     Latest Ref Rng 07/25/2022 11/29/2022  Alkaline Phosphatase     44 - 121 IU/L  198 (H)   LIVER FRACTION     18 - 85 %  56   BONE FRACTION     14 - 68 %  39   INTESTINAL FRAC.     0 - 18 %  5   GGT     0 - 60 IU/L 10     She had a wrist fracture in the past - 2013-2014: falling on ice.  Reviewed previous DXA scan report -osteopenia: She had a L humerus stress fracture after her reverse shoulder arthroplasty - 2023.  02/16/2022 [facility]) Lumbar spine L1-L3 (L4) Femoral neck (FN) 33% distal radius Ultra distal radius FRAX  T-score +0.5 RFN: -1.2 LFN: -1.0 -1.0 -0.7 MOF: 14.6% Hip fracture risk: 2.0%  Change in BMD from previous DXA test (%) + 15.5%* n/a n/a n/a   (*) statistically significant  No h/o vitamin D deficiency  or osteomalacia. Reviewed available vit D levels: 06/05/2022: Vitamin D 44.9 08/18/2021: Vitamin D 50.1 No results found for: "VD25OH" Pt is on: - calcium 1x a day - vitamin D 2000 units daily - vitamin C 1000 mcg daily - B complex - MVI daily - no iron  She had R en Y 2007.  No h/o consistent hyper/hypocalcemia or hyperparathyroidism. No h/o kidney stones. 06/05/2022: Calcium 9.3 Lab Results  Component Value Date   CALCIUM 9.4 11/01/2021   CALCIUM 9.1 09/27/2021   CALCIUM 8.5 (L) 09/03/2018   CALCIUM 9.6 01/04/2016   CALCIUM 9.5 06/28/2012   No results found for: "PHOS"   No h/o thyrotoxicosis. She has hypothyroidism. Reviewed TSH recent levels:  06/05/2022:  TSH 0.751 Lab Results  Component Value Date   TSH 1.770 11/01/2021   Pt is on Synthroid DAW 150 mcg daily, taken: - in am - fasting - at least 1h from b'fast - + calcium at night - no iron - + multivitamins with it - no PPIs - + on Biotin  No h/o CKD. Last BUN/Cr: 06/05/2022: 15/0.98, GFR 61 Lab Results  Component Value Date   BUN 16 11/01/2021   CREATININE 1.04 (H) 11/01/2021   No history of cancer,  particularly osteosarcoma, or known bone metastases.  No history of diabetes: 06/05/2022: Glucose 94 Lab Results  Component Value Date   HGBA1C 5.7 (H) 01/05/2016   No history of myeloid metaplasia. Latest CBC: Lab Results  Component Value Date   WBC 6.3 08/25/2022   HGB 11.6 (L) 08/25/2022   HCT 37.9 08/25/2022   MCV 91.1 08/25/2022   PLT 283 08/25/2022   She works part-time as an Optometrist.   ROS: Constitutional: no weight gain, no weight loss, + fatigue, no subjective hyperthermia, no subjective hypothermia, no nocturia Eyes: no blurry vision, no xerophthalmia ENT: no sore throat, no nodules palpated in neck, no dysphagia, no odynophagia, no hoarseness, no tinnitus, + hypoacusis Cardiovascular: no CP, no SOB, no palpitations, no leg swelling Respiratory: no cough, no SOB, no  wheezing Gastrointestinal: no N, no V, no D, + C, no acid reflux Musculoskeletal: no muscle, no joint aches Skin: + rash, no hair loss, + easy bruising, + excessive hair growth Neurological: no tremors, no numbness or tingling/no dizziness/no HAs Psychiatric: no depression, + anxiety  I reviewed pt's medications, allergies, PMH, social hx, family hx, and changes were documented in the history of present illness. Otherwise, unchanged from my initial visit note.  Past Medical History:  Diagnosis Date   Anxiety    Depression    HTN (hypertension)    resolved with weight loss s/p gastric bypass   Hypothyroidism    Since at least 2007   Osteoporosis    RLS (restless legs syndrome)    Sleep apnea    Stress fracture of left shoulder    Stroke [facility])    2013/February 2017   Urticaria    Past Surgical History:  Procedure Laterality Date   BREAST BIOPSY Right    benign   CATARACT EXTRACTION Bilateral 2018   CHOLECYSTECTOMY     COLONOSCOPY  2004   internal hemorrhoids, left-sided diverticulae, splenic flexure polyp (32m), path unavailable at this time   COLONOSCOPY  04/2011   RMR: Internal and external hemorrhoids, rectal tubular adenoma removed, left-sided diverticulosis   COLONOSCOPY N/A 06/01/2016   Procedure: COLONOSCOPY;  Surgeon: RDaneil Dolin MD;  Location: [facility];  Service: Endoscopy;  Laterality: N/A;  0830   COLONOSCOPY WITH PROPOFOL N/A 07/06/2022   Procedure: COLONOSCOPY WITH PROPOFOL;  Surgeon: RDaneil Dolin MD;  Location: [facility];  Service: Endoscopy;  Laterality: N/A;  10:15am   ESOPHAGOGASTRODUODENOSCOPY  2004   small hiatal hernia   GASTRIC ROUX-EN-Y  2006   REVERSE SHOULDER ARTHROPLASTY Left 09/29/2021   Procedure: LEFT REVERSE SHOULDER ARTHROPLASTY;  Surgeon: CMordecai Rasmussen MD;  Location: [facility] ORS;  Service: Orthopedics;  Laterality: Left;   Social History   Socioeconomic History   Marital status: Divorced    Spouse name: Not on file   Number  of children: 1   Years of education: college   Highest education level: Not on file  Occupational History   Occupation: retired     [employer] farm manager RETIRED  Tobacco Use   Smoking status: Former    Packs/day: 1.50    Years: 20.00    Total pack years: 30.00    Types: Cigarettes    Quit date: 11/28/1987    Years since quitting: 35.1    Passive exposure: Never   Smokeless tobacco: Never   Tobacco comments:    Quit x 25 plus years  Vaping Use   Vaping Use: Never used  Substance and Sexual Activity   Alcohol use: No  Alcohol/week: 0.0 standard drinks of alcohol   Drug use: No   Sexual activity: Not Currently  Other Topics Concern   Not on file  Social History Narrative   Caffeine 6 cups daily.  Retired/[employer],   2 yrs college,  Divorced, one daughter.   Social Determinants of Health   Financial Resource Strain: Not on file  Food Insecurity: Not on file  Transportation Needs: Not on file  Physical Activity: Not on file  Stress: Not on file  Social Connections: Not on file  Intimate Partner Violence: Not on file   Current Outpatient Medications on File Prior to Visit  Medication Sig Dispense Refill   ALPRAZolam (XANAX) 0.5 MG tablet Take 0.5 mg by mouth See admin instructions. Take 0.5 mg at bedtime, may take 0.5 mg dose 2 times daily as needed for anxiety     amLODipine (NORVASC) 2.5 MG tablet Take 2.5 mg by mouth daily.     Ascorbic Acid (VITAMIN C) 1000 MG tablet Take 1,000 mg by mouth daily.     aspirin EC 81 MG tablet Take 81 mg by mouth daily.     atorvastatin (LIPITOR) 10 MG tablet Take 1 tablet (10 mg total) by mouth daily. (Patient taking differently: Take 10 mg by mouth every evening.) 30 tablet 1   B Complex-C (SUPER B COMPLEX PO) Take 1 tablet by mouth daily.     Cholecalciferol (VITAMIN D) 50 MCG (2000 UT) CAPS Take 2,000 Units by mouth daily.     cholestyramine (QUESTRAN) 4 g packet Take 1 packet (4 g total) by mouth daily. 30 each 11   diphenhydrAMINE (BENADRYL)  12.5 MG/5ML elixir Take 12.5-25 mg by mouth 4 (four) times daily as needed for allergies.     diphenhydrAMINE (BENADRYL) 25 MG tablet Take 25 mg by mouth daily as needed for allergies.     DULoxetine (CYMBALTA) 60 MG capsule Take 60 mg by mouth daily.     ipratropium (ATROVENT) 0.03 % nasal spray Place 2 sprays into both nostrils 3 (three) times daily as needed for rhinitis. 30 mL 3   losartan (COZAAR) 100 MG tablet TAKE 1 TABLET BY MOUTH EVERY DAY 30 tablet 2   Multiple Vitamins-Minerals (ICAPS AREDS 2 PO) Take 1 capsule by mouth in the morning and at bedtime.     Multiple Vitamins-Minerals (MULTIVITAMIN WITH MINERALS) tablet Take 1 tablet by mouth daily.     rOPINIRole (REQUIP) 0.5 MG tablet TAKE 1 TABLET BY MOUTH AT BEDTIME. 90 tablet 1   SYNTHROID 150 MCG tablet Take 150 mcg by mouth daily before breakfast.     No current facility-administered medications on file prior to visit.   Allergies  Allergen Reactions   Augmentin [Amoxicillin-Pot Clavulanate] Anaphylaxis   Family History  Problem Relation Age of Onset   Eczema Mother    Stroke Mother    Lung cancer Father    Stroke Father    Stroke Brother    Hypertension Brother    Stroke Maternal Grandmother    Stroke Maternal Grandfather    Allergic rhinitis Daughter    Cancer Daughter    Cancer Other        family history    Coronary artery disease Other        family history    Arthritis Other        family history    PE: BP 118/78 (BP Location: Right Arm, Patient Position: Sitting, Cuff Size: Normal)   Pulse 72   Ht '5\' 3"'$  (  1.6 m)   Wt 165 lb 6.4 oz (75 kg)   SpO2 98%   BMI 29.30 kg/m  Wt Readings from Last 3 Encounters:  01/19/23 165 lb 6.4 oz (75 kg)  11/24/22 168 lb (76.2 kg)  11/16/22 165 lb 14.4 oz (75.3 kg)   Constitutional: overweight, in NAD Eyes:  EOMI, no exophthalmos ENT: no neck masses, no cervical lymphadenopathy Cardiovascular: RRR, No MRG Respiratory: CTA B Musculoskeletal: no deformities Skin:no  rashes Neurological: no tremor with outstretched hands  Assessment: 1. High alkaline phosphatase  2.  Acquired hypothyroidism  Plan: 1. High alkaline phosphatase -At this visit, I reviewed alkaline phosphatase levels along with the patient.  They were consistently above the normal range. -We reviewed possible alkaline phosphatase to include liver and bone etiologies.  Since her gamma-glutamyl transferase and the rest of her LFTs were normal, the most likely etiology is from bone. -Physiologic causes of elevated alkaline phosphatase are: After fatty meals (however, this is most likely due to the liver alkaline phosphatase fraction), after fracture, in pregnancy, during growth, but these are not pertinent to the patient.  Other possible causes are:  Vitamin D deficiency and associated osteomalacia (vitamin D level in 2022, 2023 and we will repeat this today) osteogenic sarcoma/bone mets or other cancers (no known cancer, no bone pain) hyperparathyroidism  (calcium levels were normal 05/2022 and will repeat this today along with PTH) Hyperthyroidism (TSH normal 05/2022 on Synthroid 150 mcg daily) Uncontrolled diabetes (no history of this) Myeloid metaplasia (no history of this, normal CBC) Paget's disease Idiopathic -The majority of the above etiologies have been ruled out by previous work-up, which leaves [place] with a diagnosis of Paget's disease. -We discussed about what this entails: This is a benign disorder of bone which affects 1 or more bones and is due to increased osteoclast activity with rapid, disordered, osteoblast-induced bone formation -Possible complications from Paget disease can be increased pain in the affected bone, instability of the joint near the lesion, fractures, hearing loss if  Paget affects the skull, and, very rarely CHF and osteosarcoma -Treatment is usually with bisphosphonates but not necessarily in mild, asymptomatic cases -Markers of bone formation and resorption  can further be checked, however, in her case, with normal LFTs, I have no doubt that the excess alkaline phosphatase has a skeletal origin -For now, I suggested to proceed with a bone scan to identify possible Paget's sites, however, we will wait for the results to come back for -If no lesions are found, we will continue to follow her conservatively with yearly alkaline phosphatase levels -If there are lesions, depending on the locations, we may need to treat with zoledronic acid, which is the preferred bisphosphonate in this case.  2.  Acquired hypothyroidism - addressed per patient's request - latest thyroid labs reviewed with pt. >> normal in 05/2022 - she continues on Synthroid 150 mcg daily -she would like to continue with the brand-name - pt feels good on this dose. - we discussed about taking the thyroid hormone every day, with water, >30 minutes before breakfast, separated by >4 hours from acid reflux medications, calcium, iron, multivitamins. Pt. is not taking it correctly as she is taking it with all her morning meds including multivitamins.  I advised her to separate levothyroxine from all of the medications and to move multivitamins at least 4 hours later.  She is on colestyramine which can decrease the absorption of Synthroid, but she is taking this in the afternoon. - will check thyroid  tests in 1.5 months after she is taking it correctly: TSH and fT4  - Total time spent for the visit: 1 hour, in precharting, post charting, obtaining medical information from the chart, reviewing her  previous labs, imaging evaluations, and treatments, reviewing her symptoms, counseling her about her endocrine conditions (please see the discussed topics above), and developing a plan to further investigate and treat them; she had a number of questions which I addressed.   Component     Latest Ref Rng 01/19/2023  Calcium     8.7 - 10.3 mg/dL 9.5   Vitamin D, 25-Hydroxy     30 - 100 ng/mL 46   Alkaline  phosphatase (APISO)     37 - 153 U/L 184 (H)   Amylase, Serum     21 - 101 U/L 67   PTH, Intact     15 - 65 pg/mL 31   Ambulates was checked by mistake by the lab, but will be canceled so that the patient will not be billed for the test. Calcium, vitamin D, PTH are all normal.  Alkaline phosphatase is improving, but still elevated. Will go ahead with the bone scan, as discussed.  Whole-body scan (02/09/2023):   Photopenic defect at LEFT shoulder consistent with prosthesis.   Degenerative type uptake of tracer at [facility] joints, sternoclavicular joints, LEFT wrist, hips, RIGHT knee, ankles.   No additional abnormal sites of tracer uptake are identified.   Specifically, no abnormal osseous tracer accumulation to suggest Paget's disease.   Expected urinary tract and soft tissue distribution of tracer.   IMPRESSION: Scattered degenerative type uptake of tracer as above with postsurgical changes of LEFT shoulder arthroplasty.   Remainder of exam unremarkable.  Philemon Kingdom, MD PhD [facility] Endocrinology

## 2023-01-19 NOTE — Patient Instructions (Addendum)
Please continue Synthroid 150 mcg daily.  Take the thyroid hormone every day, with water, at least 30 minutes before breakfast, separated by at least 4 hours from: - acid reflux medications - calcium - iron - multivitamins  Move Multivitamins in the pm.  Please stop at the lab.  For the thyroid tests, come back in 5-6 weeks.  You should have an endocrinology follow-up appointment in 6 months.  Paget's Disease of Bone Paget's disease is a condition that makes the bones grow faster than normal. Healthy bones rebuild themselves by breaking down old bone and replacing it with new bone on a regular basis. This process is called bone turnover or remodeling and normally slows down with age. In Paget's disease, new bone is formed faster than the old bone can be removed. This may cause bones to: Be larger and weaker than normal. Have abnormal shapes (deformities). Break more easily than healthy bones. Paget's disease may affect just a few bones, or it may affect bones all over the body. Bones in the arms, legs, spine, pelvis, and skull are often affected. What are the causes? The cause of this condition is not known. What increases the risk? The following factors may make you more likely to develop this condition: Having a family history of the disease. Being 44 years of age or older. Being female. Being of European descent. What are the signs or symptoms? Symptoms of this condition may include: Bone pain, neck pain, and headache. Joint pain or stiffness. Tingling or numbness. Legs that bend outward from the hips to the ankles (bowed legs). Bones that break easily. A feeling of warmth in areas of skin that are over the affected bone. Loss of height. You may also have changes in the shape of the skull or other bones. Hearing loss may occur if the skull bones are affected. In some cases, there are no symptoms of this condition. How is this diagnosed? This condition may be diagnosed based  on: A physical exam. Your medical history. Blood and urine tests. Imaging tests, such as X-rays and bone scans. Removal and testing of a bone sample (bone biopsy). This is rarely needed. How is this treated? Treatment for this condition depends on your symptoms and which areas of your body are affected. If you have no symptoms, you may not need treatment. However, you may need treatment if the condition is causing symptoms, affecting your skull, or putting you at risk for broken bones (fractures). The goals of treatment are to relieve bone pain and to prevent the condition from getting worse. Treatment may include: Medicines for pain. This may include NSAIDs, such as ibuprofen. Medicines that slow down abnormal bone turnover (bisphosphonates or calcitonin). Calcium and vitamin D supplements. Devices to help you move around. These are called assistive devices or mobility aids. These may include a cane, walker, or brace. Surgery to treat problems that are caused by the disease. Surgery is rarely used. Follow these instructions at home: Medicines Take over-the-counter and prescription medicines only as told by your health care provider. Take calcium and vitamin D supplements as told by your health care provider. If you are taking a bisphosphonate by mouth: Take the medicine with a large glass of water. Take it in the morning. Do not eat or drink anything for 30 minutes after taking the medicine. Stay upright for 30 minutes after taking the medicine. Avoid lying down during this time. Preventing falls and fractures  This condition may put you at greater risk for  falls and fractures. To help prevent falls: Use a cane or walker as needed. Wear closed-toe shoes that fit well and support your feet. Wear shoes that have rubber soles or low heels. Remove clutter and tripping hazards from all walkways. Use good lighting in all rooms. Use a night-light to help you see during the night. Install and  use handrails on stairways and in the bathroom. Have your eyes checked regularly. If you wear glasses, wear them at all times. Ask your health care provider what activities are safe for you. You may need to avoid activities that put you at risk for falls or injuries, such as contact sports. General instructions  Wear braces as told by your health care provider. You may need to have regular blood tests to monitor your treatment. Keep all follow-up visits. This is important. Contact a health care provider if: You have bone pain or joint pain that gets worse. You have hearing loss. You have swelling in your legs or ankles. You have abdominal pain. You lose your appetite. You are not able to have a bowel movement. You have fatigue or weakness. Get help right away if: You think that you might have a broken bone. You fall. You have chest pain. You have shortness of breath. You have numbness or are not able to move your arms or legs. Summary Paget's disease is a condition that makes the bones grow faster than normal. This causes bones to be larger and weaker than normal, have abnormal shapes (deformities), and break more easily than healthy bones. This condition is more likely to develop in people who have a family history of the disease, are 56 or older, are female, or are of European descent. The goals of treatment are to relieve bone pain and to prevent the condition from getting worse. Treatment may include pain medicine, medicines that slow down abnormal bone turnover (bisphosphonates or calcitonin), and in rare cases, surgery. Keep all follow-up visits. This is important. You may need to have regular blood tests to monitor your treatment. This information is not intended to replace advice given to you by your health care provider. Make sure you discuss any questions you have with your health care provider. Document Revised: 04/29/2020 Document Reviewed: 04/29/2020 Elsevier Patient Education   [place].

## 2023-01-20 LAB — PTH, INTACT AND CALCIUM: Calcium: 9.5 mg/dL (ref 8.7–10.3)

## 2023-01-21 LAB — PTH, INTACT AND CALCIUM: PTH: 31 pg/mL (ref 15–65)

## 2023-01-22 ENCOUNTER — Other Ambulatory Visit: Payer: Self-pay

## 2023-01-22 LAB — ALKALINE PHOSPHATASE: Alkaline phosphatase (APISO): 184 U/L — ABNORMAL HIGH (ref 37–153)

## 2023-01-22 LAB — AMYLASE: Amylase: 67 U/L (ref 21–101)

## 2023-01-22 LAB — VITAMIN D 25 HYDROXY (VIT D DEFICIENCY, FRACTURES): Vit D, 25-Hydroxy: 46 ng/mL (ref 30–100)

## 2023-01-25 ENCOUNTER — Encounter: Payer: Self-pay | Admitting: Radiology

## 2023-01-30 ENCOUNTER — Other Ambulatory Visit (HOSPITAL_COMMUNITY): Payer: Medicare Other

## 2023-01-30 ENCOUNTER — Encounter: Payer: Self-pay | Admitting: Internal Medicine

## 2023-01-30 ENCOUNTER — Inpatient Hospital Stay (HOSPITAL_COMMUNITY): Admission: RE | Admit: 2023-01-30 | Payer: Medicare Other | Source: Ambulatory Visit

## 2023-01-31 ENCOUNTER — Encounter (HOSPITAL_COMMUNITY): Payer: Medicare Other

## 2023-02-07 ENCOUNTER — Encounter (HOSPITAL_COMMUNITY)
Admission: RE | Admit: 2023-02-07 | Discharge: 2023-02-07 | Disposition: A | Discharge status: Home / Self Care | Payer: Medicare Other | Source: Ambulatory Visit | Attending: Internal Medicine | Admitting: Internal Medicine

## 2023-02-07 DIAGNOSIS — R748 Abnormal levels of other serum enzymes: Secondary | ICD-10-CM | POA: Diagnosis not present

## 2023-02-07 DIAGNOSIS — Z96612 Presence of left artificial shoulder joint: Secondary | ICD-10-CM | POA: Diagnosis not present

## 2023-02-07 MED ORDER — TECHNETIUM TC 99M MEDRONATE IV KIT: 20.0000 | PACK | Freq: Once | INTRAVENOUS | Status: DC | PRN

## 2023-02-12 ENCOUNTER — Encounter (INDEPENDENT_AMBULATORY_CARE_PROVIDER_SITE_OTHER): Payer: Medicare Other | Admitting: Ophthalmology

## 2023-02-14 ENCOUNTER — Encounter (INDEPENDENT_AMBULATORY_CARE_PROVIDER_SITE_OTHER): Payer: Medicare Other | Admitting: Ophthalmology

## 2023-02-14 DIAGNOSIS — H35342 Macular cyst, hole, or pseudohole, left eye: Secondary | ICD-10-CM | POA: Diagnosis not present

## 2023-02-14 DIAGNOSIS — H43813 Vitreous degeneration, bilateral: Secondary | ICD-10-CM

## 2023-02-14 DIAGNOSIS — H26492 Other secondary cataract, left eye: Secondary | ICD-10-CM

## 2023-02-14 DIAGNOSIS — H353112 Nonexudative age-related macular degeneration, right eye, intermediate dry stage: Secondary | ICD-10-CM

## 2023-02-14 DIAGNOSIS — I10 Essential (primary) hypertension: Secondary | ICD-10-CM

## 2023-02-14 DIAGNOSIS — H35033 Hypertensive retinopathy, bilateral: Secondary | ICD-10-CM

## 2023-02-26 ENCOUNTER — Telehealth: Payer: Self-pay | Admitting: Internal Medicine

## 2023-02-26 ENCOUNTER — Other Ambulatory Visit: Payer: Self-pay

## 2023-02-26 DIAGNOSIS — E039 Hypothyroidism, unspecified: Secondary | ICD-10-CM

## 2023-02-26 NOTE — Addendum Note (Signed)
Addended by: Adah Salvage F on: 02/26/2023 05:06 PM   Modules accepted: Orders

## 2023-02-26 NOTE — Telephone Encounter (Signed)
Patient called and is requesting that her labs from Dr Cruzita Lederer released so that [facility] would be able to see them.

## 2023-02-27 NOTE — Telephone Encounter (Signed)
Labs released.  

## 2023-02-28 DIAGNOSIS — L900 Lichen sclerosus et atrophicus: Secondary | ICD-10-CM | POA: Diagnosis not present

## 2023-03-06 ENCOUNTER — Ambulatory Visit: Payer: Medicare Other | Admitting: Internal Medicine

## 2023-03-14 ENCOUNTER — Other Ambulatory Visit: Payer: Self-pay

## 2023-03-14 ENCOUNTER — Encounter (INDEPENDENT_AMBULATORY_CARE_PROVIDER_SITE_OTHER): Payer: Medicare Other | Admitting: Ophthalmology

## 2023-03-14 ENCOUNTER — Encounter: Payer: Self-pay | Admitting: Allergy & Immunology

## 2023-03-14 ENCOUNTER — Other Ambulatory Visit: Payer: Medicare Other

## 2023-03-14 ENCOUNTER — Ambulatory Visit (INDEPENDENT_AMBULATORY_CARE_PROVIDER_SITE_OTHER): Payer: Medicare Other | Admitting: Allergy & Immunology

## 2023-03-14 VITALS — BP 122/70 | HR 90 | Temp 98.0°F | Resp 20 | Ht 63.0 in | Wt 168.0 lb

## 2023-03-14 DIAGNOSIS — J310 Chronic rhinitis: Secondary | ICD-10-CM

## 2023-03-14 DIAGNOSIS — E039 Hypothyroidism, unspecified: Secondary | ICD-10-CM | POA: Diagnosis not present

## 2023-03-14 DIAGNOSIS — L299 Pruritus, unspecified: Secondary | ICD-10-CM | POA: Diagnosis not present

## 2023-03-14 DIAGNOSIS — Z961 Presence of intraocular lens: Secondary | ICD-10-CM

## 2023-03-14 MED ORDER — IPRATROPIUM BROMIDE 0.03 % NA SOLN: 2.0000 | Freq: Three times a day (TID) | NASAL | 3 refills | Status: AC | PRN

## 2023-03-14 NOTE — Patient Instructions (Addendum)
1. Non-allergic rhinitis - Previous testing was negative. - We are going to continue with Atrovent (ipratropium) 0.03% one spray per nostril 2-3 times daily as needed (CAN BE OVER DRYING)  2. Pruritus - This is much better!   3. Return in about 1 year (around 03/13/2024).    Please inform us of any Emergency Department visits, hospitalizations, or changes in symptoms. Call us before going to the ED for breathing or allergy symptoms since we might be able to fit you in for a sick visit. Feel free to contact us anytime with any questions, problems, or concerns.  It was a pleasure to see you again today!  Websites that have reliable patient information: 1. American Academy of Asthma, Allergy, and Immunology: www.aaaai.org 2. Food Allergy Research and Education (FARE): foodallergy.org 3. Mothers of Asthmatics: http://www.asthmacommunitynetwork.org 4. American College of Allergy, Asthma, and Immunology: www.acaai.org   COVID-19 Vaccine Information can be found at: [email or web address].[email or web address] For questions related to vaccine distribution or appointments, please email [email or web address] or call [phone].   We realize that you might be concerned about having an allergic reaction to the COVID19 vaccines. To help with that concern, WE ARE OFFERING THE COVID19 VACCINES IN OUR OFFICE! Ask the front desk for dates!     "Like" [place] on Facebook and Instagram for our latest updates!      A healthy democracy works best when [employer] participate! Make sure you are registered to vote! If you have moved or changed any of your contact information, you will need to get this updated before voting!  In some cases, you MAY be able to register to vote online: [email or web address]

## 2023-03-14 NOTE — Progress Notes (Signed)
FOLLOW UP  Date of Service/Encounter:  03/14/23   Assessment:   Non-allergic rhinitis   Pruritus   Elevated alkaline phosphatase - under control with cholestyramine PRN  Hypothyroidism - on Synthroid  Hypercholesterolemia - on atorvastatin  Hypertension - on amlodipine and losartan  Anxiety - on duloxetine and alprazolam  Plan/Recommendations:   1. Non-allergic rhinitis - Previous testing was negative. - We are going to continue with Atrovent (ipratropium) 0.03% one spray per nostril 2-3 times daily as needed (CAN BE OVER DRYING)  2. Pruritus - This is much better!   3. Return in about 1 year (around 03/13/2024).    Subjective:   Virginia [name] is a 77 y.o. female presenting today for follow up of  Chief Complaint  Patient presents with   Follow-up    Pt states she is doing good so far.    Virginia [name] has a history of the following: Patient Active Problem List   Diagnosis Date Noted   Elevated alkaline phosphatase level 11/24/2022   Palpitations 04/14/2022   Tachycardia 04/14/2022   Glenohumeral arthritis, left 09/29/2021   Intolerance of continuous positive airway pressure (CPAP) ventilation 02/17/2021   Status post bariatric surgery 02/17/2021   Periodic limb movement disorder (PLMD) 02/17/2021   OSA (obstructive sleep apnea) 02/17/2021   RLS (restless legs syndrome) 01/22/2018   History of colonic polyps    Constipation 05/11/2016   Gait difficulty 01/04/2016   Aphasia 01/04/2016   Acute CVA (cerebrovascular accident) 01/04/2016   CVA (cerebral infarction) 01/04/2016   Ataxia    OSA on CPAP 12/02/2013   CTS (carpal tunnel syndrome) 03/18/2013   Wrist fracture 02/04/2013   Distal radius fracture 12/26/2012   Hyperlipidemia 06/30/2012   Elevated blood pressure reading without diagnosis of hypertension 06/29/2012   Stroke 06/28/2012   Hypothyroidism 06/28/2012   Anxiety 06/28/2012   Personal history of colonic polyps 04/26/2011    Family history of colonic polyps 04/26/2011   ARTHRITIS, LEFT KNEE 06/06/2010   DERANGEMENT OF POSTERIOR HORN OF MEDIAL MENISCUS 06/06/2010   ANEMIA, B12 DEFICIENCY 02/07/2010   TRIGGER FINGER, THUMB 02/07/2010   SHOULDER PAIN 10/14/2008   BURSITIS, SHOULDER 10/14/2008   TRIGGER FINGER 06/15/2008    History obtained from: chart review and patient.  Tonji is a 77 y.o. female presenting for a follow up visit.  She was last seen in December 2023 for evaluation of nonallergic rhinitis and pruritus.  At that time, we did testing that was negative to the entire panel.  We started Atrovent nasal spray 1 spray per nostril 2-3 times daily.  Her pruritus is already getting better, so we did not do any further testing.  Since last visit, she has done well.   Allergic Rhinitis Symptom History: Her nose is under good control. She has the Atrovent nasal spray and now she is using this one spray per nostril sometime. This is affordable and she is very happy with how it is working.    Skin Symptom History: She is now doing the cholestyramine as needed when the itching is better.  Itching is completely resolved. She is feeling much better today.   She is otherwise doing well. She just finished a busy tax season at [employer], which she continues to enjoy.   Otherwise, there have been no changes to her past medical history, surgical history, family history, or social history.    Review of Systems  Constitutional: Negative.  Negative for chills, fever, malaise/fatigue and weight loss.  HENT:  Negative.  Negative for congestion, ear discharge and ear pain.        Positive for postnasal drip.  Eyes:  Negative for pain, discharge and redness.  Respiratory:  Negative for cough, sputum production, shortness of breath and wheezing.   Cardiovascular: Negative.  Negative for chest pain and palpitations.  Gastrointestinal:  Negative for abdominal pain, heartburn, nausea and vomiting.  Skin:  Negative for  itching and rash.  Neurological:  Negative for dizziness and headaches.  Endo/Heme/Allergies:  Negative for environmental allergies. Does not bruise/bleed easily.       Objective:   Blood pressure 122/70, pulse 90, temperature 98 F (36.7 C), resp. rate 20, height  (1.6 m), weight 168 lb (76.2 kg), SpO2 97 %. Body mass index is 29.76 kg/m.    Physical Exam Vitals reviewed.  Constitutional:      Appearance: She is well-developed.     Comments: Lovely and talkative.  HENT:     Head: Normocephalic and atraumatic.     Right Ear: Tympanic membrane, ear canal and external ear normal. No drainage, swelling or tenderness. Tympanic membrane is not injected, scarred, erythematous, retracted or bulging.     Left Ear: Tympanic membrane, ear canal and external ear normal. No drainage, swelling or tenderness. Tympanic membrane is not injected, scarred, erythematous, retracted or bulging.     Nose: No nasal deformity, septal deviation, mucosal edema or rhinorrhea.     Right Sinus: No maxillary sinus tenderness or frontal sinus tenderness.     Left Sinus: No maxillary sinus tenderness or frontal sinus tenderness.     Mouth/Throat:     Mouth: Mucous membranes are not pale and not dry.     Pharynx: Uvula midline.  Eyes:     General:        Right eye: No discharge.        Left eye: No discharge.     Conjunctiva/sclera: Conjunctivae normal.     Right eye: Right conjunctiva is not injected. No chemosis.    Left eye: Left conjunctiva is not injected. No chemosis.    Pupils: Pupils are equal, round, and reactive to light.  Cardiovascular:     Rate and Rhythm: Normal rate and regular rhythm.     Heart sounds: Normal heart sounds.  Pulmonary:     Effort: Pulmonary effort is normal. No tachypnea, accessory muscle usage or respiratory distress.     Breath sounds: Normal breath sounds. No wheezing, rhonchi or rales.  Chest:     Chest wall: No tenderness.  Lymphadenopathy:     Head:      Right side of head: No submandibular, tonsillar or occipital adenopathy.     Left side of head: No submandibular, tonsillar or occipital adenopathy.     Cervical: No cervical adenopathy.  Skin:    General: Skin is warm.     Capillary Refill: Capillary refill takes less than 2 seconds.     Coloration: Skin is not pale.     Findings: No abrasion, erythema, petechiae or rash. Rash is not papular, urticarial or vesicular.     Comments: No excoriations noted.   Neurological:     Mental Status: She is alert.      Diagnostic studies: none      Malachi Bonds, MD  [facility]

## 2023-03-15 LAB — TSH: TSH: 2.18 u[IU]/mL (ref 0.450–4.500)

## 2023-03-15 LAB — T4, FREE: Free T4: 1.11 ng/dL (ref 0.82–1.77)

## 2023-03-16 ENCOUNTER — Encounter: Payer: Self-pay | Admitting: Allergy & Immunology

## 2023-03-16 ENCOUNTER — Ambulatory Visit: Payer: Medicare Other | Admitting: Internal Medicine

## 2023-03-27 ENCOUNTER — Ambulatory Visit (INDEPENDENT_AMBULATORY_CARE_PROVIDER_SITE_OTHER): Payer: Medicare Other | Admitting: Internal Medicine

## 2023-03-27 ENCOUNTER — Encounter: Payer: Self-pay | Admitting: Internal Medicine

## 2023-03-27 VITALS — BP 104/64 | HR 76 | Temp 97.6°F | Ht 63.0 in | Wt 168.4 lb

## 2023-03-27 DIAGNOSIS — R748 Abnormal levels of other serum enzymes: Secondary | ICD-10-CM

## 2023-03-27 NOTE — Patient Instructions (Signed)
It was good to see you again today!  You have had an extensive/exhaustive evaluation as the cause of the alkaline phosphatase elevation.  We have not found an underlying serious health problem to explain it.  I am glad the cholestyramine/Questran taken a as needed is doing a good job with controlling your itching.  To wrap-up your evaluation, I recommend you go down and see the folks at the [facility] in [place].  You pretty much have had a thorough evaluation.  However, I would like their opinion as to the cause of your elevated alkaline phosphatase.

## 2023-03-27 NOTE — Progress Notes (Unsigned)
Primary Care Physician:  Elfredia Nevins, MD Primary Gastroenterologist:  Dr. Jena Gauss  Pre-Procedure History & Physical: HPI:  Virginia [name] is a 77 y.o. female here for here for follow-up of elevated alkaline phosphatase and associated pruritus.  Questran only being taken a couple times a week pretty much abolished her pruritus.  This lady has had an extensive evaluation as to the cause of her isolated elevated alkaline phosphatase.  Extensive serological evaluation and a liver biopsy demonstrating only mild steatosis no serological evidence of PBC PSC.  No space-occupying lesion.  GGT and bile acids normal.  Moreover, she just had an extensive endocrinology -failing to yield a cause of her elevated alkaline phosphatase.  Past Medical History:  Diagnosis Date   Anxiety    Depression    HTN (hypertension)    resolved with weight loss s/p gastric bypass   Hypothyroidism    Since at least 2007   Osteoporosis    RLS (restless legs syndrome)    Sleep apnea    Stress fracture of left shoulder    Stroke [facility])    2013/February 2017   Urticaria     Past Surgical History:  Procedure Laterality Date   BREAST BIOPSY Right    benign   CATARACT EXTRACTION Bilateral 2018   CHOLECYSTECTOMY     COLONOSCOPY  2004   internal hemorrhoids, left-sided diverticulae, splenic flexure polyp (6mm), path unavailable at this time   COLONOSCOPY  04/2011   RMR: Internal and external hemorrhoids, rectal tubular adenoma removed, left-sided diverticulosis   COLONOSCOPY N/A 06/01/2016   Procedure: COLONOSCOPY;  Surgeon: Corbin Ade, MD;  Location: [facility];  Service: Endoscopy;  Laterality: N/A;  0830   COLONOSCOPY WITH PROPOFOL N/A 07/06/2022   Procedure: COLONOSCOPY WITH PROPOFOL;  Surgeon: Corbin Ade, MD;  Location: [facility];  Service: Endoscopy;  Laterality: N/A;  10:15am   ESOPHAGOGASTRODUODENOSCOPY  2004   small hiatal hernia   GASTRIC ROUX-EN-Y  2006   REVERSE SHOULDER  ARTHROPLASTY Left 09/29/2021   Procedure: LEFT REVERSE SHOULDER ARTHROPLASTY;  Surgeon: Oliver Barre, MD;  Location: [facility];  Service: Orthopedics;  Laterality: Left;    Prior to Admission medications   Medication Sig Start Date End Date Taking? Authorizing Provider  ALPRAZolam Prudy Feeler) 0.5 MG tablet Take 0.5 mg by mouth See admin instructions. Take 0.5 mg at bedtime, may take 0.5 mg dose 2 times daily as needed for anxiety 03/27/11  Yes [provider]  amLODipine (NORVASC) 2.5 MG tablet Take 2.5 mg by mouth daily.   Yes [provider]  Ascorbic Acid (VITAMIN C) 1000 MG tablet Take 1,000 mg by mouth daily.   Yes [provider]  aspirin EC 81 MG tablet Take 81 mg by mouth daily.   Yes [provider]  atorvastatin (LIPITOR) 10 MG tablet Take 1 tablet (10 mg total) by mouth daily. Patient taking differently: Take 10 mg by mouth every evening. 01/22/18  Yes Dohmeier, Porfirio Mylar, MD  B Complex-C (SUPER B COMPLEX PO) Take 1 tablet by mouth daily.   Yes [provider]  Cholecalciferol (VITAMIN D) 50 MCG (2000 UT) CAPS Take 2,000 Units by mouth daily.   Yes [provider]  cholestyramine (QUESTRAN) 4 g packet Take 1 packet (4 g total) by mouth daily. 07/25/22  Yes Michaeljoseph Revolorio, Gerrit Friends, MD  DULoxetine (CYMBALTA) 60 MG capsule Take 60 mg by mouth daily.   Yes [provider]  ipratropium (ATROVENT) 0.03 % nasal spray  Place 2 sprays into both nostrils 3 (three) times daily as needed for rhinitis. 03/14/23  Yes Alfonse Spruce, MD  losartan (COZAAR) 100 MG tablet TAKE 1 TABLET BY MOUTH EVERY DAY 08/05/19  Yes Laqueta Linden, MD  Multiple Vitamins-Minerals (ICAPS AREDS 2 PO) Take 1 capsule by mouth in the morning and at bedtime.   Yes [provider]  Multiple Vitamins-Minerals (MULTIVITAMIN WITH MINERALS) tablet Take 1 tablet by mouth daily.   Yes [provider]  rOPINIRole (REQUIP) 0.5 MG tablet TAKE 1 TABLET BY MOUTH AT  BEDTIME. 05/16/22  Yes Dohmeier, Porfirio Mylar, MD  SYNTHROID 150 MCG tablet Take 150 mcg by mouth daily before breakfast. 07/17/21  Yes [provider]    Allergies as of 03/27/2023 - Review Complete 03/27/2023  Allergen Reaction Noted   Augmentin [amoxicillin-pot clavulanate] Anaphylaxis 09/27/2021    Family History  Problem Relation Age of Onset   Eczema Mother    Stroke Mother    Lung cancer Father    Stroke Father    Stroke Brother    Hypertension Brother    Stroke Maternal Grandmother    Stroke Maternal Grandfather    Allergic rhinitis Daughter    Cancer Daughter    Cancer Other        family history    Coronary artery disease Other        family history    Arthritis Other        family history     Social History   Socioeconomic History   Marital status: Divorced    Spouse name: Not on file   Number of children: 1   Years of education: college   Highest education level: Not on file  Occupational History   Occupation: retired     Associate Professor: RETIRED  Tobacco Use   Smoking status: Former    Packs/day: 1.50    Years: 20.00    Additional pack years: 0.00    Total pack years: 30.00    Types: Cigarettes    Quit date: 11/28/1987    Years since quitting: 35.3    Passive exposure: Never   Smokeless tobacco: Never   Tobacco comments:    Quit x 25 plus years  Vaping Use   Vaping Use: Never used  Substance and Sexual Activity   Alcohol use: No    Alcohol/week: 0.0 standard drinks of alcohol   Drug use: No   Sexual activity: Not Currently  Other Topics Concern   Not on file  Social History Narrative   Caffeine 6 cups daily.  Retired/[employer],   2 yrs college,  Divorced, one daughter.   Social Determinants of Health   Financial Resource Strain: Not on file  Food Insecurity: Not on file  Transportation Needs: Not on file  Physical Activity: Not on file  Stress: Not on file  Social Connections: Not on file  Intimate Partner Violence: Not on file    Review  of Systems: See HPI, otherwise negative ROS  Physical Exam: BP 104/64 (BP Location: Right Arm, Patient Position: Sitting, Cuff Size: Normal)   Pulse 76   Temp 97.6 F (36.4 C) (Oral)   Ht 5\' 3"  (1.6 m)   Wt 168 lb 6.4 oz (76.4 kg)   SpO2 97%   BMI 29.83 kg/m  General:   Alert,  Well-developed, well-nourished, pleasant and cooperative in NAD  Impression/Plan: Impression: 77 year old lady with an isolated elevated alkaline phosphatase over a period of years.  Other liver parameters normal.  Her biopsy and serological markers all negative as to a specific cause of alkaline phosphatase elevation.  Moreover, recent extensive endocrinology workup also negative.  At this point the only other possible explanation here could be AMA negative PBC with sampling error on the liver biopsy.  I would stop short of recommending a course of ursodiol at this point in her evaluation given negative evidence for that entity.  Recommendations:  I agree with continuing Questran/cholestyramine on an as-needed basis.  Remember not to take within 2 hours before after other medications.  To wrap up the liver evaluation, I think it would be worthwhile for patient to go down to [facility] and be seen and let them weigh in on the potential cause of her elevated alkaline phosphatase and associated pruritus.  Will make that referral in the near future.  Further recommendations to follow.        Notice: This dictation was prepared with Dragon dictation along with smaller phrase technology. Any transcriptional errors that result from this process are unintentional and may not be corrected upon review.

## 2023-03-29 ENCOUNTER — Other Ambulatory Visit: Payer: Self-pay | Admitting: *Deleted

## 2023-03-29 DIAGNOSIS — R748 Abnormal levels of other serum enzymes: Secondary | ICD-10-CM

## 2023-05-10 ENCOUNTER — Other Ambulatory Visit: Payer: Self-pay | Admitting: Adult Health

## 2023-05-10 MED ORDER — ROPINIROLE HCL 0.5 MG PO TABS: 0.5000 mg | ORAL_TABLET | Freq: Every day | ORAL | 0 refills | Status: DC

## 2023-05-10 NOTE — Telephone Encounter (Signed)
Pt requesting a refill on rOPINIRole (REQUIP) 0.5 MG tablet. Should be sent to  [facility] [phone]

## 2023-05-23 ENCOUNTER — Encounter: Payer: Self-pay | Admitting: Adult Health

## 2023-05-23 ENCOUNTER — Ambulatory Visit (INDEPENDENT_AMBULATORY_CARE_PROVIDER_SITE_OTHER): Payer: Medicare Other | Admitting: Adult Health

## 2023-05-23 VITALS — BP 104/60 | HR 74 | Ht 63.0 in | Wt 169.0 lb

## 2023-05-23 DIAGNOSIS — G4733 Obstructive sleep apnea (adult) (pediatric): Secondary | ICD-10-CM

## 2023-05-23 DIAGNOSIS — G2581 Restless legs syndrome: Secondary | ICD-10-CM | POA: Diagnosis not present

## 2023-05-23 MED ORDER — ROPINIROLE HCL 0.5 MG PO TABS: 0.5000 mg | ORAL_TABLET | Freq: Every day | ORAL | 3 refills | Status: DC

## 2023-06-21 DIAGNOSIS — Z6829 Body mass index (BMI) 29.0-29.9, adult: Secondary | ICD-10-CM | POA: Diagnosis not present

## 2023-06-21 DIAGNOSIS — Z1231 Encounter for screening mammogram for malignant neoplasm of breast: Secondary | ICD-10-CM | POA: Diagnosis not present

## 2023-06-21 DIAGNOSIS — E663 Overweight: Secondary | ICD-10-CM | POA: Diagnosis not present

## 2023-06-21 DIAGNOSIS — Z0001 Encounter for general adult medical examination with abnormal findings: Secondary | ICD-10-CM | POA: Diagnosis not present

## 2023-06-21 DIAGNOSIS — E063 Autoimmune thyroiditis: Secondary | ICD-10-CM | POA: Diagnosis not present

## 2023-06-21 DIAGNOSIS — I10 Essential (primary) hypertension: Secondary | ICD-10-CM | POA: Diagnosis not present

## 2023-06-21 DIAGNOSIS — Z1331 Encounter for screening for depression: Secondary | ICD-10-CM | POA: Diagnosis not present

## 2023-06-21 DIAGNOSIS — F419 Anxiety disorder, unspecified: Secondary | ICD-10-CM | POA: Diagnosis not present

## 2023-06-21 DIAGNOSIS — R748 Abnormal levels of other serum enzymes: Secondary | ICD-10-CM | POA: Diagnosis not present

## 2023-06-22 DIAGNOSIS — E782 Mixed hyperlipidemia: Secondary | ICD-10-CM | POA: Diagnosis not present

## 2023-06-22 DIAGNOSIS — K760 Fatty (change of) liver, not elsewhere classified: Secondary | ICD-10-CM | POA: Diagnosis not present

## 2023-06-22 DIAGNOSIS — R748 Abnormal levels of other serum enzymes: Secondary | ICD-10-CM | POA: Diagnosis not present

## 2023-07-03 ENCOUNTER — Encounter: Payer: Self-pay | Admitting: Physician Assistant

## 2023-07-03 ENCOUNTER — Ambulatory Visit: Payer: Medicare Other | Attending: Physician Assistant | Admitting: Physician Assistant

## 2023-07-03 VITALS — BP 118/76 | HR 66 | Ht 63.0 in | Wt 169.0 lb

## 2023-07-03 DIAGNOSIS — I6523 Occlusion and stenosis of bilateral carotid arteries: Secondary | ICD-10-CM | POA: Insufficient documentation

## 2023-07-03 DIAGNOSIS — Z8673 Personal history of transient ischemic attack (TIA), and cerebral infarction without residual deficits: Secondary | ICD-10-CM | POA: Diagnosis not present

## 2023-07-03 DIAGNOSIS — E785 Hyperlipidemia, unspecified: Secondary | ICD-10-CM | POA: Diagnosis not present

## 2023-07-03 DIAGNOSIS — I471 Supraventricular tachycardia, unspecified: Secondary | ICD-10-CM | POA: Insufficient documentation

## 2023-07-03 DIAGNOSIS — G4733 Obstructive sleep apnea (adult) (pediatric): Secondary | ICD-10-CM | POA: Diagnosis not present

## 2023-07-03 MED ORDER — METOPROLOL TARTRATE 25 MG PO TABS
ORAL_TABLET | ORAL | 6 refills | Status: AC
Start: 2023-07-03 — End: ?

## 2023-07-03 NOTE — Progress Notes (Signed)
Cardiology Office Note:  .   Date:  07/03/2023  ID:  Virginia [name], DOB 1946/08/17, MRN [id number] PCP: Elfredia Nevins, MD   HeartCare Providers Cardiologist:  Prentice Docker, MD (Inactive)    History of Present Illness: .   Virginia [name] is a 77 y.o. female with history of HLD, CVA, OSA on CPAP who saw Dr. Izora Ribas last year with palpitations. Monitor showed SVT(15 runs) and started on metoprolol.  Patient comes in for yearly f/u. She never took the metoprolol. I reviewed her monitor in detail. She has occasions when she feels a hard beat and fast especially after eating. Doesn't last long and not too bothersome. Walking 1.6 miles 3-4 times a week but had to cut back recently because of knee problems.not using cpap as often as she should.   ROS:    Studies Reviewed: [facility]i [facility] 04/2022 Patient had a minimum heart rate of 55 bpm, maximum heart rate of 203 bpm, and average heart rate of 78 bpm. Predominant underlying rhythm was sinus rhythm. 15 short runs of paroxysmal SVT, longest 1 minute, fastest at 152 bpm. Isolated PVCs were rare (<1.0%). No triggered and diary events.   Asymptomatic paroxysmal SVT.  Echo 2017 Study Conclusions   - Left ventricle: The cavity size was normal. Wall thickness was    normal. Systolic function was normal. The estimated ejection    fraction was in the range of 55% to 60%. Wall motion was normal;    there were no regional wall motion abnormalities. Doppler    parameters are consistent with abnormal left ventricular    relaxation (grade 1 diastolic dysfunction).   Impressions:   - No cardiac source of emboli was indentified. Compared to the    prior study, there has been no significant interval change.     EKG Interpretation Date/Time:  Tuesday July 03 2023 12:39:26 EDT Ventricular Rate:  74 PR Interval:  146 QRS Duration:  76 QT Interval:  380 QTC Calculation: 421 R Axis:   8  Text Interpretation: Normal sinus  rhythm Low voltage QRS When compared with ECG of 27-Sep-2021 14:02, No significant change was found Confirmed by Jacolyn Reedy [id number]) on 07/03/2023 12:43:55 PM    Prior CV Studies: No results found for this or any previous visit from the past 3650 days.       Risk Assessment/Calculations:             Physical Exam:   VS:  BP 118/76   Pulse 66   Ht 5\' 3"  (1.6 m)   Wt 169 lb (76.7 kg)   SpO2 94%   BMI 29.94 kg/m    Wt Readings from Last 3 Encounters:  07/03/23 169 lb (76.7 kg)  05/23/23 169 lb (76.7 kg)  03/27/23 168 lb 6.4 oz (76.4 kg)    GEN: Well nourished, well developed in no acute distress NECK: No JVD; No carotid bruits CARDIAC:  RRR, no murmurs, rubs, gallops RESPIRATORY:  Clear to auscultation without rales, wheezing or rhonchi  ABDOMEN: Soft, non-tender, non-distended EXTREMITIES:  No edema; No deformity   ASSESSMENT AND PLAN: .    SVT/palpitations-occasional symptoms but would like to have metoprolol on hand to take prn.  OSA on CPAP but not using often-encouraged her to use.  History of CVA 1-39% carotid bilaterally 2017-recheck  HLD LDL 46 05/2023 on lipitor        Dispo: f/u in 1 yr.   Signed, Jacolyn Reedy, PA-C

## 2023-07-03 NOTE — Patient Instructions (Signed)
Medication Instructions:   Take Lopressor 12.5 -25 mg as needed   *If you need a refill on your cardiac medications before your next appointment, please call your pharmacy*   Lab Work: NONE   If you have labs (blood work) drawn today and your tests are completely normal, you will receive your results only by: MyChart Message (if you have MyChart) OR A paper copy in the mail If you have any lab test that is abnormal or we need to change your treatment, we will call you to review the results.   Testing/Procedures: Your physician has requested that you have a carotid duplex. This test is an ultrasound of the carotid arteries in your neck. It looks at blood flow through these arteries that supply the brain with blood. Allow one hour for this exam. There are no restrictions or special instructions.    Follow-Up: At [facility], you and your health needs are our priority.  As part of our continuing mission to provide you with exceptional heart care, we have created designated Provider Care Teams.  These Care Teams include your primary Cardiologist (physician) and Advanced Practice Providers (APPs -  Physician Assistants and Nurse Practitioners) who all work together to provide you with the care you need, when you need it.  We recommend signing up for the patient portal called "MyChart".  Sign up information is provided on this After Visit Summary.  MyChart is used to connect with patients for Virtual Visits (Telemedicine).  Patients are able to view lab/test results, encounter notes, upcoming appointments, etc.  Non-urgent messages can be sent to your provider as well.   To learn more about what you can do with MyChart, go to [email or web address].    Your next appointment:   1 year(s)  Provider:   Luane School, MD    Other Instructions Thank you for choosing [facility]!

## 2023-07-10 ENCOUNTER — Ambulatory Visit (HOSPITAL_COMMUNITY)
Admission: RE | Admit: 2023-07-10 | Discharge: 2023-07-10 | Disposition: A | Discharge status: Home / Self Care | Payer: Medicare Other | Source: Ambulatory Visit | Attending: Physician Assistant | Admitting: Physician Assistant

## 2023-07-10 ENCOUNTER — Telehealth: Payer: Self-pay | Admitting: Internal Medicine

## 2023-07-10 DIAGNOSIS — I6523 Occlusion and stenosis of bilateral carotid arteries: Secondary | ICD-10-CM | POA: Diagnosis not present

## 2023-07-10 NOTE — Telephone Encounter (Signed)
Reviewed notes regarding [facility] consultation.  Felt alkaline phosphatase not coming from liver sources.  Metabolic associated liver disease present.  Standard recommendations.  Course of ursodiol.  Fib 4 assessment every 2 to 3 years. Elevated alkaline phosphatase of uncertain origin but felt to be not emanating from the liver.

## 2023-07-20 ENCOUNTER — Encounter: Payer: Self-pay | Admitting: Internal Medicine

## 2023-07-20 ENCOUNTER — Ambulatory Visit: Payer: Medicare Other | Admitting: Internal Medicine

## 2023-07-20 VITALS — BP 120/60 | HR 66 | Ht 63.0 in | Wt 169.4 lb

## 2023-07-20 DIAGNOSIS — E039 Hypothyroidism, unspecified: Secondary | ICD-10-CM | POA: Diagnosis not present

## 2023-07-20 DIAGNOSIS — R748 Abnormal levels of other serum enzymes: Secondary | ICD-10-CM

## 2023-07-20 LAB — TSH: TSH: 0.82 u[IU]/mL (ref 0.35–5.50)

## 2023-07-20 LAB — T4, FREE: Free T4: 0.99 ng/dL (ref 0.60–1.60)

## 2023-07-20 MED ORDER — SYNTHROID 150 MCG PO TABS: 150.0000 ug | ORAL_TABLET | Freq: Every day | ORAL | 3 refills | Status: AC

## 2023-07-20 NOTE — Patient Instructions (Signed)
Please continue Synthroid 150 mcg daily.  Take the thyroid hormone every day, with water, at least 30 minutes before breakfast, separated by at least 4 hours from: - acid reflux medications - calcium - iron - multivitamins  Please return for another visit in 1 year.

## 2023-07-20 NOTE — Progress Notes (Signed)
Patient ID: Virginia [name], female   DOB: [birth year], 77 y.o.   MRN: [id number]  HPI  Virginia [name] is a 77 y.o.-year-old female, initially referred by her PCP, Dr. Sherwood Gambler, [place] for follow-up for high alkaline phosphatase and also hypothyroidism.  Last visit 6 months ago.  Interim history: He is feeling well at today's visit, without complaints.  Her pruritus is much better. Patient saw Dr. Elsie Stain since last visit >> extensive liver investigation was normal but more specialized tests are still pending. One test for PBC (anti-smooth muscle antibody) already came back and it is positive. She was started on Ursodiol.  Reviewed history: Patient c/o itching that started at ago years.  This year she was sent to a gastroenterologist (Dr. Jena Gauss) >> started Cholestyramine. This helped significantly.  She had a liver U/S and a liver Bx >> no significant problems. After this she was referred to endocrinology for further investigation for elevated alkaline phosphatase.  Pt has had high alkaline phosphatase levels for years: 06/22/2023: Alkaline phosphatase 220 (44-121) Lab Results  Component Value Date   ALKPHOS 198 (H) 11/29/2022   ALKPHOS 200 (H) 11/17/2022   ALKPHOS 200 (H) 07/25/2022   ALKPHOS 262 (H) 11/01/2021   ALKPHOS 154 (H) 09/03/2018   ALKPHOS 174 (H) 01/04/2016   ALKPHOS 129 (H) 06/28/2012  06/05/2022: Alkaline phosphatase 309  GGT and the rest of the LFTs were normal: 06/22/2023: GGT 9 (0-60)  Lab Results  Component Value Date   ALT 23 11/17/2022   AST 23 11/17/2022   GGT 10 07/25/2022   ALKPHOS 198 (H) 11/29/2022   BILITOT 0.3 11/17/2022    Component     Latest Ref Rng 07/25/2022 11/29/2022  Alkaline Phosphatase     44 - 121 IU/L  198 (H)   LIVER FRACTION     18 - 85 %  56   BONE FRACTION     14 - 68 %  39   INTESTINAL FRAC.     0 - 18 %  5   GGT     0 - 60 IU/L 10     Whole-body scan (02/09/2023):   Photopenic defect at LEFT shoulder consistent with  prosthesis.   Degenerative type uptake of tracer at [facility] joints, sternoclavicular joints, LEFT wrist, hips, RIGHT knee, ankles.   No additional abnormal sites of tracer uptake are identified.   Specifically, no abnormal osseous tracer accumulation to suggest Paget's disease.   Expected urinary tract and soft tissue distribution of tracer.   IMPRESSION: Scattered degenerative type uptake of tracer as above with postsurgical changes of LEFT shoulder arthroplasty.   Remainder of exam unremarkable.  She had a wrist fracture in the past - 2013-2014: falling on ice.  Reviewed previous DXA scan report -osteopenia: She had a L humerus stress fracture after her reverse shoulder arthroplasty - 2023.  02/16/2022 [facility]) Lumbar spine L1-L3 (L4) Femoral neck (FN) 33% distal radius Ultra distal radius FRAX  T-score +0.5 RFN: -1.2 LFN: -1.0 -1.0 -0.7 MOF: 14.6% Hip fracture risk: 2.0%  Change in BMD from previous DXA test (%) + 15.5%* n/a n/a n/a   (*) statistically significant  No h/o vitamin D deficiency or osteomalacia. Reviewed available vit D levels: Lab Results  Component Value Date   VD25OH 46 01/19/2023  06/05/2022: Vitamin D 44.9 08/18/2021: Vitamin D 50.1  Pt is on: - calcium 1x a day - vitamin D 2000 units daily - vitamin C 1000 mcg daily - B complex - 300 mcg  daily Biotin-last dose last night - MVI daily - no iron  She had R en Y 2007.  No h/o consistent hyper/hypocalcemia or hyperparathyroidism. No h/o kidney stones. 06/22/2023: Calcium 9.7 (8.7-10.3) Lab Results  Component Value Date   PTH 31 01/19/2023   PTH Comment 01/19/2023   PTH CANCELED 01/19/2023   PTH Comment 01/19/2023   CALCIUM CANCELED 01/19/2023   CALCIUM 9.5 01/19/2023   CALCIUM 9.4 11/01/2021   CALCIUM 9.1 09/27/2021   CALCIUM 8.5 (L) 09/03/2018   CALCIUM 9.6 01/04/2016   CALCIUM 9.5 06/28/2012  06/05/2022: Calcium 9.3  No results found for: "PHOS"   No h/o thyrotoxicosis. She has  hypothyroidism. Reviewed TSH recent levels:  Lab Results  Component Value Date   TSH 2.180 03/14/2023   TSH 1.770 11/01/2021  06/05/2022:  TSH 0.751  Pt is on Synthroid DAW 150 mcg daily, taken: - in am - fasting - at least 1h from b'fast - + calcium at night - no iron - + multivitamins with it >> now taken at night - no PPIs - + on Biotin  No h/o CKD. Last BUN/Cr:  06/05/2022: 15/0.98, GFR 61 Lab Results  Component Value Date   BUN 16 11/01/2021   CREATININE 1.04 (H) 11/01/2021   No history of cancer, particularly osteosarcoma, or known bone metastases.  No history of diabetes: 06/05/2022: Glucose 94 Lab Results  Component Value Date   HGBA1C 5.7 (H) 01/05/2016   No history of myeloid metaplasia. Latest CBC: Lab Results  Component Value Date   WBC 6.3 08/25/2022   HGB 11.6 (L) 08/25/2022   HCT 37.9 08/25/2022   MCV 91.1 08/25/2022   PLT 283 08/25/2022   She works part-time as an Airline pilot.  ROS: + See HPI  I reviewed pt's medications, allergies, PMH, social hx, family hx, and changes were documented in the history of present illness. Otherwise, unchanged from my initial visit note.  Past Medical History:  Diagnosis Date   Anxiety    Depression    HTN (hypertension)    resolved with weight loss s/p gastric bypass   Hypothyroidism    Since at least 2007   Osteoporosis    RLS (restless legs syndrome)    Sleep apnea    Stress fracture of left shoulder    Stroke [facility])    2013/February 2017   Urticaria    Past Surgical History:  Procedure Laterality Date   BREAST BIOPSY Right    benign   CATARACT EXTRACTION Bilateral 2018   CHOLECYSTECTOMY     COLONOSCOPY  2004   internal hemorrhoids, left-sided diverticulae, splenic flexure polyp (6mm), path unavailable at this time   COLONOSCOPY  04/2011   RMR: Internal and external hemorrhoids, rectal tubular adenoma removed, left-sided diverticulosis   COLONOSCOPY N/A 06/01/2016   Procedure: COLONOSCOPY;   Surgeon: Corbin Ade, MD;  [facility];  Service: Endoscopy;  Laterality: N/A;  0830   COLONOSCOPY WITH PROPOFOL N/A 07/06/2022   Procedure: COLONOSCOPY WITH PROPOFOL;  Surgeon: Corbin Ade, MD;  Location: [facility];  Service: Endoscopy;  Laterality: N/A;  10:15am   ESOPHAGOGASTRODUODENOSCOPY  2004   small hiatal hernia   GASTRIC ROUX-EN-Y  2006   REVERSE SHOULDER ARTHROPLASTY Left 09/29/2021   Procedure: LEFT REVERSE SHOULDER ARTHROPLASTY;  Surgeon: Oliver Barre, MD;  Location: [facility];  Service: Orthopedics;  Laterality: Left;   Social History   Socioeconomic History   Marital status: Divorced    Spouse name: Not on file  Number of children: 1   Years of education: college   Highest education level: Not on file  Occupational History   Occupation: retired     Associate Professor: RETIRED  Tobacco Use   Smoking status: Former    Current packs/day: 0.00    Average packs/day: 1.5 packs/day for 20.0 years (30.0 ttl pk-yrs)    Types: Cigarettes    Start date: 11/28/1967    Quit date: 11/28/1987    Years since quitting: 35.6    Passive exposure: Never   Smokeless tobacco: Never   Tobacco comments:    Quit x 25 plus years  Vaping Use   Vaping status: Never Used  Substance and Sexual Activity   Alcohol use: No    Alcohol/week: 0.0 standard drinks of alcohol   Drug use: No   Sexual activity: Not Currently  Other Topics Concern   Not on file  Social History Narrative   Caffeine about 3 cups daily.  Retired/[employer],   2 yrs college,  Divorced, one daughter.   Social Determinants of Health   Financial Resource Strain: Not on file  Food Insecurity: Low Risk  (06/22/2023)   Received from [facility]   Food vital sign    Within the past 12 months, you worried that your food would run out before you got money to buy more: Never true    Within the past 12 months, the food you bought just didn't last and you didn't have money to get more. : Never true  Transportation  Needs: Not on file (06/22/2023)  Physical Activity: Not on file  Stress: Not on file  Social Connections: Not on file  Intimate Partner Violence: Not on file   Current Outpatient Medications on File Prior to Visit  Medication Sig Dispense Refill   ALPRAZolam (XANAX) 0.5 MG tablet Take 0.5 mg by mouth See admin instructions. Take 0.5 mg at bedtime, may take 0.5 mg dose 2 times daily as needed for anxiety     amLODipine (NORVASC) 2.5 MG tablet Take 2.5 mg by mouth daily.     Ascorbic Acid (VITAMIN C) 1000 MG tablet Take 1,000 mg by mouth daily.     aspirin EC 81 MG tablet Take 81 mg by mouth daily.     atorvastatin (LIPITOR) 10 MG tablet Take 1 tablet (10 mg total) by mouth daily. (Patient taking differently: Take 10 mg by mouth every evening.) 30 tablet 1   B Complex-C (SUPER B COMPLEX PO) Take 1 tablet by mouth daily.     Cholecalciferol (VITAMIN D) 50 MCG (2000 UT) CAPS Take 2,000 Units by mouth daily.     cholestyramine (QUESTRAN) 4 g packet Take 1 packet (4 g total) by mouth daily. 30 each 11   DULoxetine (CYMBALTA) 60 MG capsule Take 60 mg by mouth daily.     ipratropium (ATROVENT) 0.03 % nasal spray Place 2 sprays into both nostrils 3 (three) times daily as needed for rhinitis. 90 mL 3   losartan (COZAAR) 100 MG tablet TAKE 1 TABLET BY MOUTH EVERY DAY 30 tablet 2   metoprolol tartrate (LOPRESSOR) 25 MG tablet Take 1/2 to 1 Tablet as needed 60 tablet 6   Multiple Vitamins-Minerals (ICAPS AREDS 2 PO) Take 1 capsule by mouth in the morning and at bedtime.     Multiple Vitamins-Minerals (MULTIVITAMIN WITH MINERALS) tablet Take 1 tablet by mouth daily.     rOPINIRole (REQUIP) 0.5 MG tablet Take 1 tablet (0.5 mg total) by mouth at bedtime. 90 tablet  3   SYNTHROID 150 MCG tablet Take 150 mcg by mouth daily before breakfast.     No current facility-administered medications on file prior to visit.   Allergies  Allergen Reactions   Augmentin [Amoxicillin-Pot Clavulanate] Anaphylaxis   Family  History  Problem Relation Age of Onset   Eczema Mother    Stroke Mother    Lung cancer Father    Stroke Father    Stroke Brother    Hypertension Brother    Stroke Maternal Grandmother    Stroke Maternal Grandfather    Allergic rhinitis Daughter    Cancer Daughter    Cancer Other        family history    Coronary artery disease Other        family history    Arthritis Other        family history    PE: BP 120/60   Pulse 66   Ht 5\' 3"  (1.6 m)   Wt 169 lb 6.4 oz (76.8 kg)   SpO2 95%   BMI 30.01 kg/m  Wt Readings from Last 3 Encounters:  07/20/23 169 lb 6.4 oz (76.8 kg)  07/03/23 169 lb (76.7 kg)  05/23/23 169 lb (76.7 kg)   Constitutional: overweight, in NAD Eyes:  EOMI, no exophthalmos ENT: no neck masses, no cervical lymphadenopathy Cardiovascular: RRR, No MRG Respiratory: CTA B Musculoskeletal: no deformities Skin:no rashes Neurological: no tremor with outstretched hands  Assessment: 1. High alkaline phosphatase  2.  Acquired hypothyroidism  Plan: 1. High alkaline phosphatase -Her alkaline phosphatase levels are consistently above the normal range -We reviewed possible causes for the elevated alkaline phosphatase including liver and bone etiologies. -Physiologic causes of elevated alkaline phosphatase are: Checking after fatty meals (however, this is most likely due to the liver alkaline phosphatase fraction), after fracture, in pregnancy, during growth, but these are not pertinent for the patient. Other possible causes are: Liver disease (AST, ALT, GGT were normal; liver biopsy only showed fatty liver; she is seeing Dr. Elsie Stain -more specialized liver tests are pending but anti-smooth muscle antibodies just returned yesterday and they are positive Vitamin D deficiency and associated osteomalacia (vitamin D levels were normal) Osteogenic sarcoma/bone mets or other cancers (no known cancer, no bone pain) Hyperparathyroidism (calcium levels were normal, PTH also  normal) Hyperthyroidism (TSH levels are normal while on Synthroid) Uncontrolled diabetes (no history of this) Myeloid metaplasia (no history of this, normal CBC) Paget's disease (bone scan in 01/2023 did not show any increase uptake site other than due to OA) Idiopathic -As of now, it appears that her elevated alkaline phosphatase could be related to PBC, but will wait for the input from Dr. Elsie Stain.  Patient was already started on ursodiol. -No further endocrine intervention is needed for this  2.  Acquired hypothyroidism - latest thyroid labs reviewed with pt. >> normal: Lab Results  Component Value Date   TSH 2.180 03/14/2023  - she continues on Synthroid 150 mcg daily - pt feels good on this dose. - we discussed about taking the thyroid hormone every day, with water, >30 minutes before breakfast, separated by >4 hours from acid reflux medications, calcium, iron, multivitamins. Pt.was not taking it correctly at last visit that she was taking it together with all of her morning meds including multivitamins.  I advised her to move multivitamins at least 4 hours later.  She is doing this now.  She is also on cholestyramine which can decrease the absorption of Synthroid but she is taking  this in the afternoon. -At today's visit, we will recheck her TFTs and see if we need to decrease the dose Synthroid now that she is taking it correctly.  Needs refills Synthroid.  Component     Latest Ref Rng 07/20/2023  TSH     0.35 - 5.50 uIU/mL 0.82   T4,Free(Direct)     0.60 - 1.60 ng/dL 0.98   TFTs are excellent.  Will refill the same dose of Synthroid.  Carlus Pavlov, MD PhD [facility]

## 2023-09-19 DIAGNOSIS — Z23 Encounter for immunization: Secondary | ICD-10-CM | POA: Diagnosis not present

## 2023-11-02 DIAGNOSIS — H903 Sensorineural hearing loss, bilateral: Secondary | ICD-10-CM | POA: Diagnosis not present

## 2023-11-13 DIAGNOSIS — E2749 Other adrenocortical insufficiency: Secondary | ICD-10-CM | POA: Diagnosis not present

## 2023-11-13 DIAGNOSIS — J029 Acute pharyngitis, unspecified: Secondary | ICD-10-CM | POA: Diagnosis not present

## 2023-11-13 DIAGNOSIS — E6609 Other obesity due to excess calories: Secondary | ICD-10-CM | POA: Diagnosis not present

## 2023-11-13 DIAGNOSIS — I10 Essential (primary) hypertension: Secondary | ICD-10-CM | POA: Diagnosis not present

## 2023-11-13 DIAGNOSIS — Z6832 Body mass index (BMI) 32.0-32.9, adult: Secondary | ICD-10-CM | POA: Diagnosis not present

## 2023-11-16 ENCOUNTER — Ambulatory Visit (HOSPITAL_COMMUNITY)
Admission: RE | Admit: 2023-11-16 | Discharge: 2023-11-16 | Disposition: A | Discharge status: Home / Self Care | Payer: Medicare Other | Source: Ambulatory Visit | Attending: Internal Medicine | Admitting: Internal Medicine

## 2023-11-16 ENCOUNTER — Other Ambulatory Visit (HOSPITAL_COMMUNITY): Payer: Self-pay | Admitting: Internal Medicine

## 2023-11-16 DIAGNOSIS — R490 Dysphonia: Secondary | ICD-10-CM | POA: Diagnosis not present

## 2023-11-16 DIAGNOSIS — R1310 Dysphagia, unspecified: Secondary | ICD-10-CM | POA: Insufficient documentation

## 2023-11-16 DIAGNOSIS — Z6832 Body mass index (BMI) 32.0-32.9, adult: Secondary | ICD-10-CM | POA: Diagnosis not present

## 2023-11-16 DIAGNOSIS — I10 Essential (primary) hypertension: Secondary | ICD-10-CM | POA: Diagnosis not present

## 2023-11-16 DIAGNOSIS — I6522 Occlusion and stenosis of left carotid artery: Secondary | ICD-10-CM | POA: Diagnosis not present

## 2023-11-16 DIAGNOSIS — E6609 Other obesity due to excess calories: Secondary | ICD-10-CM | POA: Diagnosis not present

## 2023-11-16 DIAGNOSIS — R070 Pain in throat: Secondary | ICD-10-CM | POA: Diagnosis not present

## 2023-11-16 DIAGNOSIS — E2749 Other adrenocortical insufficiency: Secondary | ICD-10-CM | POA: Diagnosis not present

## 2023-11-16 MED ORDER — IOHEXOL 300 MG/ML  SOLN
100.0000 mL | Freq: Once | INTRAMUSCULAR | Status: AC | PRN
  Administered 2023-11-16: 75 mL via INTRAVENOUS

## 2023-11-19 DIAGNOSIS — R1310 Dysphagia, unspecified: Secondary | ICD-10-CM | POA: Diagnosis not present

## 2023-11-19 DIAGNOSIS — Z6832 Body mass index (BMI) 32.0-32.9, adult: Secondary | ICD-10-CM | POA: Diagnosis not present

## 2023-11-19 DIAGNOSIS — R490 Dysphonia: Secondary | ICD-10-CM | POA: Diagnosis not present

## 2023-11-19 DIAGNOSIS — E6609 Other obesity due to excess calories: Secondary | ICD-10-CM | POA: Diagnosis not present

## 2023-11-19 DIAGNOSIS — J040 Acute laryngitis: Secondary | ICD-10-CM | POA: Diagnosis not present

## 2023-12-04 DIAGNOSIS — R0989 Other specified symptoms and signs involving the circulatory and respiratory systems: Secondary | ICD-10-CM | POA: Diagnosis not present

## 2024-03-17 ENCOUNTER — Encounter (INDEPENDENT_AMBULATORY_CARE_PROVIDER_SITE_OTHER): Payer: Medicare Other | Admitting: Ophthalmology

## 2024-03-17 DIAGNOSIS — I10 Essential (primary) hypertension: Secondary | ICD-10-CM | POA: Diagnosis not present

## 2024-03-17 DIAGNOSIS — H43813 Vitreous degeneration, bilateral: Secondary | ICD-10-CM | POA: Diagnosis not present

## 2024-03-17 DIAGNOSIS — H35342 Macular cyst, hole, or pseudohole, left eye: Secondary | ICD-10-CM | POA: Diagnosis not present

## 2024-03-17 DIAGNOSIS — H353132 Nonexudative age-related macular degeneration, bilateral, intermediate dry stage: Secondary | ICD-10-CM

## 2024-03-17 DIAGNOSIS — H35033 Hypertensive retinopathy, bilateral: Secondary | ICD-10-CM

## 2024-03-17 DIAGNOSIS — H35372 Puckering of macula, left eye: Secondary | ICD-10-CM | POA: Diagnosis not present

## 2024-03-28 ENCOUNTER — Telehealth: Payer: Self-pay

## 2024-03-28 NOTE — Telephone Encounter (Signed)
 Enter in error

## 2024-04-27 NOTE — Progress Notes (Unsigned)
 PATIENT: Virginia [name] DOB: 10-30-1946  REASON FOR VISIT: follow up HISTORY FROM: patient PRIMARY NEUROLOGIST: Dr. Albertina Hugger  Chief Complaint  Patient presents with   Follow-up    Pt in 19 alone Pt here for cpap f/u Pt hasn't used cpap in a year Pt states has had a very stressful year and when she placed mask on face she became claustrophobic Pt would like a 90 day refill on requip       HISTORY OF PRESENT ILLNESS: Today 04/29/24:  Virginia [name] is a 78 y.o. female with a history of OSA on CPAP and RLS. Returns today for follow-up.  She reports that she has not used her CPAP in a year.  States that she has been under a lot of stress with different things including caring for her daughter.  She states that she does want to start using CPAP again.  Reports that Requip  1 mg at bedtime has helped with her restless legs.  She states that she has noticed that she gets symptoms earlier in the day.  Reports only new medical history is 3 bulging disks in the neck.  Just saw her PCP for this.  Returns today for an evaluation.   05/23/23: Virginia [name] is a 78 y.o. female with a history of OSA on CPAP and RLS. Returns today for follow-up.  She reports that the CPAP is working well.  She states that she temporarily stopped using it but now she has restarted.  She states that there are some nights that her little dog wakes her up and she may not put the CPAP back on.  Continues to take Requip  1 mg before bedtime and that works well for her.  Her download is below       REVIEW OF SYSTEMS: Out of a complete 14 system review of symptoms, the patient complains only of the following symptoms, and all other reviewed systems are negative.   ESS 9  ALLERGIES: Allergies  Allergen Reactions   Augmentin [Amoxicillin-Pot Clavulanate] Anaphylaxis    HOME MEDICATIONS: Outpatient Medications Prior to Visit  Medication Sig Dispense Refill   ALPRAZolam  (XANAX ) 0.5 MG tablet Take 0.5 mg by  mouth See admin instructions. Take 0.5 mg at bedtime, may take 0.5 mg dose 2 times daily as needed for anxiety     amLODipine (NORVASC) 2.5 MG tablet Take 2.5 mg by mouth daily.     Ascorbic Acid (VITAMIN C) 1000 MG tablet Take 1,000 mg by mouth daily.     aspirin  EC 81 MG tablet Take 81 mg by mouth daily.     atorvastatin  (LIPITOR) 10 MG tablet Take 1 tablet (10 mg total) by mouth daily. (Patient taking differently: Take 10 mg by mouth every evening.) 30 tablet 1   B Complex-C (SUPER B COMPLEX PO) Take 1 tablet by mouth daily.     Cholecalciferol  (VITAMIN D ) 50 MCG (2000 UT) CAPS Take 2,000 Units by mouth daily.     cholestyramine  (QUESTRAN ) 4 g packet Take 1 packet (4 g total) by mouth daily. 30 each 11   DULoxetine  (CYMBALTA ) 60 MG capsule Take 60 mg by mouth daily.     ipratropium (ATROVENT ) 0.03 % nasal spray Place 2 sprays into both nostrils 3 (three) times daily as needed for rhinitis. 90 mL 3   losartan  (COZAAR ) 100 MG tablet TAKE 1 TABLET BY MOUTH EVERY DAY 30 tablet 2   metoprolol  tartrate (LOPRESSOR ) 25 MG tablet Take 1/2 to 1 Tablet as needed  60 tablet 6   Multiple Vitamins-Minerals (ICAPS AREDS 2 PO) Take 1 capsule by mouth in the morning and at bedtime.     Multiple Vitamins-Minerals (MULTIVITAMIN WITH MINERALS) tablet Take 1 tablet by mouth daily.     rOPINIRole  (REQUIP ) 0.5 MG tablet Take 1 tablet (0.5 mg total) by mouth at bedtime. 90 tablet 3   SYNTHROID  150 MCG tablet Take 1 tablet (150 mcg total) by mouth daily before breakfast. 90 tablet 3   ursodiol (ACTIGALL) 250 MG tablet Take by mouth in the morning and at bedtime. 2 tablets two times a day     No facility-administered medications prior to visit.    PAST MEDICAL HISTORY: Past Medical History:  Diagnosis Date   Anxiety    Depression    HTN (hypertension)    resolved with weight loss s/p gastric bypass   Hypothyroidism    Since at least 2007   Osteoporosis    RLS (restless legs syndrome)    Sleep apnea     Stress fracture of left shoulder    Stroke [facility])    2013/February 2017   Urticaria     PAST SURGICAL HISTORY: Past Surgical History:  Procedure Laterality Date   BREAST BIOPSY Right    benign   CATARACT EXTRACTION Bilateral 2018   CHOLECYSTECTOMY     COLONOSCOPY  2004   internal hemorrhoids, left-sided diverticulae, splenic flexure polyp (6mm), path unavailable at this time   COLONOSCOPY  04/2011   RMR: Internal and external hemorrhoids, rectal tubular adenoma removed, left-sided diverticulosis   COLONOSCOPY N/A 06/01/2016   Procedure: COLONOSCOPY;  Surgeon: Suzette Espy, MD;  [facility];  Service: Endoscopy;  Laterality: N/A;  0830   COLONOSCOPY WITH PROPOFOL  N/A 07/06/2022   Procedure: COLONOSCOPY WITH PROPOFOL ;  Surgeon: Suzette Espy, MD;  Location[facility];  Service: Endoscopy;  Laterality: N/A;  10:15am   ESOPHAGOGASTRODUODENOSCOPY  2004   small hiatal hernia   GASTRIC ROUX-EN-Y  2006   REVERSE SHOULDER ARTHROPLASTY Left 09/29/2021   Procedure: LEFT REVERSE SHOULDER ARTHROPLASTY;  Surgeon: Tonita Frater, MD;  Location: [facility];  Service: Orthopedics;  Laterality: Left;    FAMILY HISTORY: Family History  Problem Relation Age of Onset   Eczema Mother    Stroke Mother    Lung cancer Father    Stroke Father    Stroke Brother    Hypertension Brother    Stroke Maternal Grandmother    Stroke Maternal Grandfather    Allergic rhinitis Daughter    Cancer Daughter    Cancer Other        family history    Coronary artery disease Other        family history    Arthritis Other        family history    Sleep apnea Neg Hx     SOCIAL HISTORY: Social History   Socioeconomic History   Marital status: Divorced    Spouse name: Not on file   Number of children: 1   Years of education: college   Highest education level: Not on file  Occupational History   Occupation: retired     Associate Professor: RETIRED  Tobacco Use   Smoking status: Former    Current  packs/day: 0.00    Average packs/day: 1.5 packs/day for 20.0 years (30.0 ttl pk-yrs)    Types: Cigarettes    Start date: 11/28/1967    Quit date: 11/28/1987    Years since quitting: 36.4  Passive exposure: Never   Smokeless tobacco: Never   Tobacco comments:    Quit x 25 plus years  Vaping Use   Vaping status: Never Used  Substance and Sexual Activity   Alcohol use: No    Alcohol/week: 0.0 standard drinks of alcohol   Drug use: No   Sexual activity: Not Currently  Other Topics Concern   Not on file  Social History Narrative   Caffeine about 3 cups daily.  Retired/[employer],   2 yrs college,  Divorced, one daughter.   Social Drivers of Corporate investment banker Strain: Not on file  Food Insecurity: Low Risk  (06/22/2023)   Received from [facility]   Hunger Vital Sign    Worried About Running Out of Food in the Last Year: Never true    Ran Out of Food in the Last Year: Never true  Transportation Needs: Not on file (06/22/2023)  Physical Activity: Not on file  Stress: Not on file  Social Connections: Not on file  Intimate Partner Violence: Not on file      PHYSICAL EXAM  Vitals:   04/29/24 1443  BP: (!) 147/72  Pulse: 70  Weight: 192 lb (87.1 kg)  Height: 5\' 3"  (1.6 m)    Body mass index is 34.01 kg/m.  Generalized: Well developed, in no acute distress  Chest: Lungs clear to auscultation bilaterally  Neurological examination  Mentation: Alert oriented to time, place, history taking. Follows all commands speech and language fluent Cranial nerve II-XII: Facial symmetry noted Gait and station: Gait is normal.    DIAGNOSTIC DATA (LABS, IMAGING, TESTING) - I reviewed patient records, labs, notes, testing and imaging myself where available.  Lab Results  Component Value Date   WBC 6.3 08/25/2022   HGB 11.6 (L) 08/25/2022   HCT 37.9 08/25/2022   MCV 91.1 08/25/2022   PLT 283 08/25/2022      Component Value Date/Time   NA 142 11/01/2021 1528   K 4.5  11/01/2021 1528   CL 105 11/01/2021 1528   CO2 22 11/01/2021 1528   GLUCOSE 81 11/01/2021 1528   GLUCOSE 85 09/27/2021 1359   BUN 16 11/01/2021 1528   CREATININE 1.04 (H) 11/01/2021 1528   CALCIUM  CANCELED 01/19/2023 1503   CALCIUM  9.5 01/19/2023 1503   PROT 6.6 11/17/2022 1213   ALBUMIN 4.2 11/17/2022 1213   AST 23 11/17/2022 1213   ALT 23 11/17/2022 1213   ALKPHOS 198 (H) 11/29/2022 0817   BILITOT 0.3 11/17/2022 1213   GFRNONAA 55 (L) 09/27/2021 1359   GFRAA >60 09/03/2018 1138   Lab Results  Component Value Date   CHOL 184 01/05/2016   HDL 61 01/05/2016   LDLCALC 110 (H) 01/05/2016   TRIG 65 01/05/2016   CHOLHDL 3.0 01/05/2016   Lab Results  Component Value Date   HGBA1C 5.7 (H) 01/05/2016   No results found for: "VITAMINB12" Lab Results  Component Value Date   TSH 0.82 07/20/2023      ASSESSMENT AND PLAN 78 y.o. year old female  has a past medical history of Anxiety, Depression, HTN (hypertension), Hypothyroidism, Osteoporosis, RLS (restless legs syndrome), Sleep apnea, Stress fracture of left shoulder, Stroke (HCC), and Urticaria. here with:  OSA on CPAP RLS  - CPAP compliance excellent - Good treatment of AHI  - Encourage patient to use CPAP nightly and > 4 hours each night - Continue Requip  1 mg at bedtime.  Did advise that she could split the dose in half.  Taking 0.5 mg earlier in the afternoon and 0.5 mg at bedtime - F/U in 1 year or sooner if needed    Clem Currier, MSN, NP-C 04/29/2024, 3:11 PM [facility] [address] [place], [place] [address] [phone]

## 2024-04-28 ENCOUNTER — Ambulatory Visit (HOSPITAL_COMMUNITY)
Admission: RE | Admit: 2024-04-28 | Discharge: 2024-04-28 | Disposition: A | Discharge status: Home / Self Care | Source: Ambulatory Visit | Attending: Internal Medicine | Admitting: Internal Medicine

## 2024-04-28 ENCOUNTER — Encounter: Payer: Self-pay | Admitting: *Deleted

## 2024-04-28 ENCOUNTER — Other Ambulatory Visit (HOSPITAL_COMMUNITY): Payer: Self-pay | Admitting: Internal Medicine

## 2024-04-28 DIAGNOSIS — Z6833 Body mass index (BMI) 33.0-33.9, adult: Secondary | ICD-10-CM | POA: Diagnosis not present

## 2024-04-28 DIAGNOSIS — I10 Essential (primary) hypertension: Secondary | ICD-10-CM | POA: Diagnosis not present

## 2024-04-28 DIAGNOSIS — M542 Cervicalgia: Secondary | ICD-10-CM

## 2024-04-28 DIAGNOSIS — E063 Autoimmune thyroiditis: Secondary | ICD-10-CM | POA: Diagnosis not present

## 2024-04-28 DIAGNOSIS — M436 Torticollis: Secondary | ICD-10-CM | POA: Diagnosis not present

## 2024-04-28 DIAGNOSIS — M50321 Other cervical disc degeneration at C4-C5 level: Secondary | ICD-10-CM | POA: Diagnosis not present

## 2024-04-28 DIAGNOSIS — M5481 Occipital neuralgia: Secondary | ICD-10-CM | POA: Diagnosis not present

## 2024-04-28 DIAGNOSIS — E6609 Other obesity due to excess calories: Secondary | ICD-10-CM | POA: Diagnosis not present

## 2024-04-28 DIAGNOSIS — M47812 Spondylosis without myelopathy or radiculopathy, cervical region: Secondary | ICD-10-CM | POA: Diagnosis not present

## 2024-04-29 ENCOUNTER — Ambulatory Visit (INDEPENDENT_AMBULATORY_CARE_PROVIDER_SITE_OTHER): Payer: Medicare Other | Admitting: Adult Health

## 2024-04-29 ENCOUNTER — Encounter: Payer: Self-pay | Admitting: Adult Health

## 2024-04-29 ENCOUNTER — Telehealth: Payer: Self-pay | Admitting: Adult Health

## 2024-04-29 VITALS — BP 147/72 | HR 70 | Ht 63.0 in | Wt 192.0 lb

## 2024-04-29 DIAGNOSIS — G4733 Obstructive sleep apnea (adult) (pediatric): Secondary | ICD-10-CM

## 2024-04-29 DIAGNOSIS — G2581 Restless legs syndrome: Secondary | ICD-10-CM

## 2024-04-29 NOTE — Telephone Encounter (Signed)
Patient called to verify appointment 

## 2024-04-29 NOTE — Patient Instructions (Addendum)
 Your Plan:  Continue Requip  1 mg at bedtime.  Okay to split the dose taking half a tablet early in the afternoon and the other half at bedtime. If your symptoms worsen or you develop new symptoms please let us  know.   Thank you for coming to see us  at [facility]. I hope we have been able to provide you high quality care today.  You may receive a patient satisfaction survey over the next few weeks. We would appreciate your feedback and comments so that we may continue to improve ourselves and the health of our patients.

## 2024-05-05 ENCOUNTER — Other Ambulatory Visit (HOSPITAL_COMMUNITY): Payer: Self-pay | Admitting: Internal Medicine

## 2024-05-05 DIAGNOSIS — M542 Cervicalgia: Secondary | ICD-10-CM

## 2024-05-12 ENCOUNTER — Ambulatory Visit (HOSPITAL_COMMUNITY)
Admission: RE | Admit: 2024-05-12 | Discharge: 2024-05-12 | Disposition: A | Discharge status: Home / Self Care | Source: Ambulatory Visit | Attending: Internal Medicine | Admitting: Internal Medicine

## 2024-05-12 DIAGNOSIS — M542 Cervicalgia: Secondary | ICD-10-CM | POA: Insufficient documentation

## 2024-05-12 DIAGNOSIS — M47812 Spondylosis without myelopathy or radiculopathy, cervical region: Secondary | ICD-10-CM | POA: Diagnosis not present

## 2024-05-12 DIAGNOSIS — M50223 Other cervical disc displacement at C6-C7 level: Secondary | ICD-10-CM | POA: Diagnosis not present

## 2024-05-12 DIAGNOSIS — M4802 Spinal stenosis, cervical region: Secondary | ICD-10-CM | POA: Diagnosis not present

## 2024-05-12 DIAGNOSIS — M5021 Other cervical disc displacement,  high cervical region: Secondary | ICD-10-CM | POA: Diagnosis not present

## 2024-05-13 ENCOUNTER — Ambulatory Visit (INDEPENDENT_AMBULATORY_CARE_PROVIDER_SITE_OTHER): Admitting: Gastroenterology

## 2024-05-13 ENCOUNTER — Encounter: Payer: Self-pay | Admitting: Gastroenterology

## 2024-05-13 VITALS — BP 137/78 | HR 101 | Temp 97.9°F | Ht 63.0 in | Wt 186.8 lb

## 2024-05-13 DIAGNOSIS — R748 Abnormal levels of other serum enzymes: Secondary | ICD-10-CM

## 2024-05-13 DIAGNOSIS — K909 Intestinal malabsorption, unspecified: Secondary | ICD-10-CM | POA: Insufficient documentation

## 2024-05-13 DIAGNOSIS — K760 Fatty (change of) liver, not elsewhere classified: Secondary | ICD-10-CM | POA: Diagnosis not present

## 2024-05-13 DIAGNOSIS — A09 Infectious gastroenteritis and colitis, unspecified: Secondary | ICD-10-CM

## 2024-05-13 DIAGNOSIS — R350 Frequency of micturition: Secondary | ICD-10-CM | POA: Insufficient documentation

## 2024-05-13 DIAGNOSIS — R194 Change in bowel habit: Secondary | ICD-10-CM | POA: Diagnosis not present

## 2024-05-13 DIAGNOSIS — R197 Diarrhea, unspecified: Secondary | ICD-10-CM | POA: Diagnosis not present

## 2024-05-13 DIAGNOSIS — R7301 Impaired fasting glucose: Secondary | ICD-10-CM

## 2024-05-13 NOTE — Patient Instructions (Signed)
 Please complete labs and stool tests. We will be in touch with results as available.

## 2024-05-13 NOTE — Progress Notes (Signed)
 GI Office Note    Referring Provider: Kathyleen Parkins, MD Primary Care Physician:  Kathyleen Parkins, MD  Primary Gastroenterologist: Rheba Cedar, MD   Chief Complaint   Chief Complaint  Patient presents with   Constipation    Having issues with constipation and diarrhea. States that she will having constipation for a while and then will have diarrhea. States that when she has diarrhea there are times there is mucous and the stool floats and appears greasy. Pt also states that there is a knot at the opening of her vagina and is worried that it is her intestines.     History of Present Illness   Virginia [name] is a 78 y.o. female presenting today for change in stools. Last seen 2024 for follow up of chronic elevated alk phos with pruruitus. H/o chronic constipation.   Extensive evaluation for chronically elevated alkphos and prurutis here at [facility]. Evaluated by endocrinology for potential bone source of elevated alkphos but work up negative for Paget's.  Seen at [facility] last year as well. Fibrosis staging with F0-F1 by biopsy. Patient has MASLD. She was started on ursodiol 500mg  BID to see if helps itching and reduces alk phos. PBC profile was negative. Supposed to have six month follow up at [facility] but not completed, has appt soon with Duey Ghent, NP. NIT every 2-3 years with FIB-4 advised. Patient states after itching resolved, she stopped ursodiol, she cannot remember when she stopped medication. No recent labs.     Here today with change in bowels. Symptoms started in March. Constipation for good two weeks. Then flipped over to diarrhea, every thing broke lose. Then alternated between solid to almost liquids. Stools can be greasy and float. Some mild mucous. Having multiple stools daily. Not worse with meals. No change in diet. No change in medications. Has a lot of gas. No nocturnal stools. No melena, brbpr.  Sometime has to stay home due to urgency/fecal  incontinence. Today, feels fine. No abdominal pain.    Remote roux en Y. Takes vitamins chronically due to risk of malabsorption. No recent antibiotics. Recent severe neck pain. Took medrol  dose pak. Has noted increased urinary frequency but no dysuria. Has history of prediabetes.      Also concerns about a bulging she feels near vaginal opening on the right side. Concerned about prolapse. Notes when she is wiping.   Colonoscopy 06/2022: -diverticulosis   Medications   Current Outpatient Medications  Medication Sig Dispense Refill   ALPRAZolam  (XANAX ) 0.5 MG tablet Take 0.5 mg by mouth See admin instructions. Take 0.5 mg at bedtime, may take 0.5 mg dose 2 times daily as needed for anxiety (Patient taking differently: Take 0.5 mg by mouth at bedtime as needed for anxiety. Take 0.5 mg at bedtime, may take 0.5 mg dose 2 times daily as needed for anxiety)     amLODipine (NORVASC) 2.5 MG tablet Take 2.5 mg by mouth daily.     Ascorbic Acid (VITAMIN C) 1000 MG tablet Take 1,000 mg by mouth daily.     aspirin  EC 81 MG tablet Take 81 mg by mouth daily.     atorvastatin  (LIPITOR) 10 MG tablet Take 1 tablet (10 mg total) by mouth daily. 30 tablet 1   B Complex-C (SUPER B COMPLEX PO) Take 1 tablet by mouth daily.     Cholecalciferol  (VITAMIN D ) 50 MCG (2000 UT) CAPS Take 2,000 Units by mouth daily.     DULoxetine  (CYMBALTA ) 60 MG  capsule Take 60 mg by mouth daily.     ipratropium (ATROVENT ) 0.03 % nasal spray Place 2 sprays into both nostrils 3 (three) times daily as needed for rhinitis. 90 mL 3   losartan  (COZAAR ) 100 MG tablet TAKE 1 TABLET BY MOUTH EVERY DAY 30 tablet 2   metoprolol  tartrate (LOPRESSOR ) 25 MG tablet Take 1/2 to 1 Tablet as needed 60 tablet 6   Multiple Vitamins-Minerals (ICAPS AREDS 2 PO) Take 1 capsule by mouth in the morning and at bedtime.     Multiple Vitamins-Minerals (MULTIVITAMIN WITH MINERALS) tablet Take 1 tablet by mouth daily.     rOPINIRole  (REQUIP ) 0.5 MG tablet Take 1  tablet (0.5 mg total) by mouth at bedtime. 90 tablet 3   SYNTHROID  150 MCG tablet Take 1 tablet (150 mcg total) by mouth daily before breakfast. 90 tablet 3   ursodiol (ACTIGALL) 250 MG tablet Take by mouth in the morning and at bedtime. 2 tablets two times a day (Patient not taking: Reported on 05/13/2024)     No current facility-administered medications for this visit.    Allergies   Allergies as of 05/13/2024 - Review Complete 05/13/2024  Allergen Reaction Noted   Augmentin [amoxicillin-pot clavulanate] Anaphylaxis 09/27/2021       Review of Systems   General: Negative for anorexia, weight loss, fever, chills, fatigue, weakness. ENT: Negative for hoarseness, difficulty swallowing , nasal congestion. CV: Negative for chest pain, angina, palpitations, dyspnea on exertion, peripheral edema.  Respiratory: Negative for dyspnea at rest, dyspnea on exertion, cough, sputum, wheezing.  GI: See history of present illness. GU:  Negative for dysuria, hematuria, urinary incontinence, urinary frequency, nocturnal urination.  Endo: Negative for unusual weight change.     Physical Exam   BP 137/78 (BP Location: Right Arm, Patient Position: Sitting, Cuff Size: Large)   Pulse (!) 101   Temp 97.9 F (36.6 C) (Oral)   Ht 5' 3 (1.6 m)   Wt 186 lb 12.8 oz (84.7 kg)   SpO2 99%   BMI 33.09 kg/m    General: Well-nourished, well-developed in no acute distress.  Eyes: No icterus. Mouth: Oropharyngeal mucosa moist and pink   Lungs: Clear to auscultation bilaterally.  Heart: Regular rate and rhythm, no murmurs rubs or gallops.  Abdomen: Bowel sounds are normal, nontender, nondistended, no hepatosplenomegaly or masses,  no abdominal bruits or hernia , no rebound or guarding.  Rectal: not performed. Inspection of vaginal area without bulge today. Patient could not reproduce with standing or with bearing down.  Extremities: No lower extremity edema. No clubbing or deformities. Neuro: Alert and  oriented x 4   Skin: Warm and dry, no jaundice.   Psych: Alert and cooperative, normal mood and affect.  Labs   No recent labs  Imaging Studies   DG Cervical Spine 2 or 3 views Result Date: 05/03/2024 CLINICAL DATA:  Cervicalgia. Bilateral neck pain and limited range of motion. Stiffness. EXAM: CERVICAL SPINE - 2-3 VIEW COMPARISON:  CT cervical spine 01/21/2021 FINDINGS: No evidence of acute fracture or traumatic listhesis. Multilevel spondylosis with disc space height loss and degenerative endplate changes greatest at C4-C5, C5-C6, and C6-C7 where it is moderate. Mild multilevel facet arthropathy. Normal prevertebral soft tissues. IMPRESSION: Multilevel cervical spondylosis, greatest at C4-C5, C5-C6, and C6-C7 where it is moderate. Electronically Signed   By: Rozell Cornet M.D.   On: 05/03/2024 23:58    Assessment/Plan:   Change in stools/diarrhea/fatty stools: differential quite broad includes infectious etiology, malabsorption, thyroid  dysfunction, diabetes,  SIBO, IBD, malignancy less likely. -check stool for GI profile, fecal calprotectin, fecal elastase -TSH, free T4, A1C, TTG IgA, IgA, CBC, CMET, lipase, Sed rate, CRP  Increased urinary frequency without dysuria/impaired fasting glucose: -check A1C  MASLD, elevated alkaline phosphatase: -keep upcoming appointment with [facility]  Trudie Fuse. Harles Lied, MHS, PA-C [facility]

## 2024-05-14 ENCOUNTER — Telehealth: Payer: Self-pay

## 2024-05-14 NOTE — Telephone Encounter (Signed)
 noted

## 2024-05-14 NOTE — Telephone Encounter (Signed)
 Pt called to make you aware that she has to go out of town today and will be getting her labs done next week. She didn't want you to be looking for them.

## 2024-05-20 ENCOUNTER — Telehealth: Payer: Medicare Other | Admitting: Adult Health

## 2024-05-21 DIAGNOSIS — R7301 Impaired fasting glucose: Secondary | ICD-10-CM | POA: Diagnosis not present

## 2024-05-21 DIAGNOSIS — A09 Infectious gastroenteritis and colitis, unspecified: Secondary | ICD-10-CM | POA: Diagnosis not present

## 2024-05-21 DIAGNOSIS — R350 Frequency of micturition: Secondary | ICD-10-CM | POA: Diagnosis not present

## 2024-05-21 DIAGNOSIS — K909 Intestinal malabsorption, unspecified: Secondary | ICD-10-CM | POA: Diagnosis not present

## 2024-05-21 DIAGNOSIS — E785 Hyperlipidemia, unspecified: Secondary | ICD-10-CM | POA: Diagnosis not present

## 2024-05-23 LAB — CBC WITH DIFFERENTIAL/PLATELET
Basophils Absolute: 0 10*3/uL (ref 0.0–0.2)
Basos: 1 %
EOS (ABSOLUTE): 0.6 10*3/uL — ABNORMAL HIGH (ref 0.0–0.4)
Eos: 10 %
Hematocrit: 39.3 % (ref 34.0–46.6)
Hemoglobin: 12.5 g/dL (ref 11.1–15.9)
Immature Grans (Abs): 0 10*3/uL (ref 0.0–0.1)
Immature Granulocytes: 0 %
Lymphocytes Absolute: 1.1 10*3/uL (ref 0.7–3.1)
Lymphs: 18 %
MCH: 28.7 pg (ref 26.6–33.0)
MCHC: 31.8 g/dL (ref 31.5–35.7)
MCV: 90 fL (ref 79–97)
Monocytes Absolute: 0.8 10*3/uL (ref 0.1–0.9)
Monocytes: 14 %
Neutrophils Absolute: 3.4 10*3/uL (ref 1.4–7.0)
Neutrophils: 57 %
Platelets: 315 10*3/uL (ref 150–450)
RBC: 4.36 x10E6/uL (ref 3.77–5.28)
RDW: 13.1 % (ref 11.7–15.4)
WBC: 5.9 10*3/uL (ref 3.4–10.8)

## 2024-05-23 LAB — COMPREHENSIVE METABOLIC PANEL WITH GFR
ALT: 10 IU/L (ref 0–32)
AST: 18 IU/L (ref 0–40)
Albumin: 3.8 g/dL (ref 3.8–4.8)
Alkaline Phosphatase: 222 IU/L — ABNORMAL HIGH (ref 44–121)
BUN/Creatinine Ratio: 13 (ref 12–28)
BUN: 15 mg/dL (ref 8–27)
Bilirubin Total: 0.3 mg/dL (ref 0.0–1.2)
CO2: 21 mmol/L (ref 20–29)
Calcium: 9.4 mg/dL (ref 8.7–10.3)
Chloride: 101 mmol/L (ref 96–106)
Creatinine, Ser: 1.13 mg/dL — ABNORMAL HIGH (ref 0.57–1.00)
Globulin, Total: 2.7 g/dL (ref 1.5–4.5)
Glucose: 127 mg/dL — ABNORMAL HIGH (ref 70–99)
Potassium: 4.3 mmol/L (ref 3.5–5.2)
Sodium: 137 mmol/L (ref 134–144)
Total Protein: 6.5 g/dL (ref 6.0–8.5)
eGFR: 51 mL/min/{1.73_m2} — ABNORMAL LOW (ref >59)

## 2024-05-23 LAB — HEMOGLOBIN A1C
Est. average glucose Bld gHb Est-mCnc: 117 mg/dL
Hgb A1c MFr Bld: 5.7 % — ABNORMAL HIGH (ref 4.8–5.6)

## 2024-05-23 LAB — TISSUE TRANSGLUTAMINASE, IGA: Transglutaminase IgA: <2 U/mL (ref 0–3)

## 2024-05-23 LAB — SEDIMENTATION RATE: Sed Rate: 60 mm/h — ABNORMAL HIGH (ref 0–40)

## 2024-05-23 LAB — TSH+FREE T4
Free T4: 1.1 ng/dL (ref 0.82–1.77)
TSH: 0.598 u[IU]/mL (ref 0.450–4.500)

## 2024-05-23 LAB — C-REACTIVE PROTEIN: CRP: 4 mg/L (ref 0–10)

## 2024-05-23 LAB — IGA: IgA/Immunoglobulin A, Serum: 410 mg/dL (ref 64–422)

## 2024-05-23 LAB — LIPASE: Lipase: 49 U/L (ref 14–85)

## 2024-05-27 DIAGNOSIS — K909 Intestinal malabsorption, unspecified: Secondary | ICD-10-CM | POA: Diagnosis not present

## 2024-05-27 DIAGNOSIS — R350 Frequency of micturition: Secondary | ICD-10-CM | POA: Diagnosis not present

## 2024-05-27 DIAGNOSIS — A09 Infectious gastroenteritis and colitis, unspecified: Secondary | ICD-10-CM | POA: Diagnosis not present

## 2024-05-29 ENCOUNTER — Ambulatory Visit: Payer: Self-pay | Admitting: Gastroenterology

## 2024-05-29 LAB — GI PROFILE, STOOL, PCR

## 2024-05-29 LAB — CALPROTECTIN, FECAL: Calprotectin, Fecal: 97 ug/g (ref 0–120)

## 2024-05-29 LAB — PANCREATIC ELASTASE, FECAL: Pancreatic Elastase, Fecal: >800 ug Elast./g (ref >200)

## 2024-06-17 ENCOUNTER — Other Ambulatory Visit: Payer: Self-pay | Admitting: Adult Health

## 2024-06-18 DIAGNOSIS — M542 Cervicalgia: Secondary | ICD-10-CM | POA: Diagnosis not present

## 2024-06-24 ENCOUNTER — Ambulatory Visit: Admitting: Gastroenterology

## 2024-07-21 ENCOUNTER — Ambulatory Visit: Payer: Medicare Other | Admitting: Internal Medicine

## 2024-07-24 DIAGNOSIS — E6609 Other obesity due to excess calories: Secondary | ICD-10-CM | POA: Diagnosis not present

## 2024-07-24 DIAGNOSIS — E2839 Other primary ovarian failure: Secondary | ICD-10-CM | POA: Diagnosis not present

## 2024-07-24 DIAGNOSIS — I10 Essential (primary) hypertension: Secondary | ICD-10-CM | POA: Diagnosis not present

## 2024-07-24 DIAGNOSIS — E063 Autoimmune thyroiditis: Secondary | ICD-10-CM | POA: Diagnosis not present

## 2024-07-24 DIAGNOSIS — Z6832 Body mass index (BMI) 32.0-32.9, adult: Secondary | ICD-10-CM | POA: Diagnosis not present

## 2024-07-24 DIAGNOSIS — L439 Lichen planus, unspecified: Secondary | ICD-10-CM | POA: Diagnosis not present

## 2024-07-24 DIAGNOSIS — F419 Anxiety disorder, unspecified: Secondary | ICD-10-CM | POA: Diagnosis not present

## 2024-08-07 DIAGNOSIS — M542 Cervicalgia: Secondary | ICD-10-CM | POA: Diagnosis not present

## 2024-08-07 DIAGNOSIS — M9901 Segmental and somatic dysfunction of cervical region: Secondary | ICD-10-CM | POA: Diagnosis not present

## 2024-08-07 DIAGNOSIS — M9903 Segmental and somatic dysfunction of lumbar region: Secondary | ICD-10-CM | POA: Diagnosis not present

## 2024-08-07 DIAGNOSIS — M9902 Segmental and somatic dysfunction of thoracic region: Secondary | ICD-10-CM | POA: Diagnosis not present

## 2024-08-07 DIAGNOSIS — M9907 Segmental and somatic dysfunction of upper extremity: Secondary | ICD-10-CM | POA: Diagnosis not present

## 2024-08-08 DIAGNOSIS — M542 Cervicalgia: Secondary | ICD-10-CM | POA: Diagnosis not present

## 2024-08-08 DIAGNOSIS — M9907 Segmental and somatic dysfunction of upper extremity: Secondary | ICD-10-CM | POA: Diagnosis not present

## 2024-08-08 DIAGNOSIS — M9902 Segmental and somatic dysfunction of thoracic region: Secondary | ICD-10-CM | POA: Diagnosis not present

## 2024-08-08 DIAGNOSIS — M9901 Segmental and somatic dysfunction of cervical region: Secondary | ICD-10-CM | POA: Diagnosis not present

## 2024-08-08 DIAGNOSIS — M9903 Segmental and somatic dysfunction of lumbar region: Secondary | ICD-10-CM | POA: Diagnosis not present

## 2024-08-11 DIAGNOSIS — M9903 Segmental and somatic dysfunction of lumbar region: Secondary | ICD-10-CM | POA: Diagnosis not present

## 2024-08-11 DIAGNOSIS — M542 Cervicalgia: Secondary | ICD-10-CM | POA: Diagnosis not present

## 2024-08-11 DIAGNOSIS — M9901 Segmental and somatic dysfunction of cervical region: Secondary | ICD-10-CM | POA: Diagnosis not present

## 2024-08-11 DIAGNOSIS — M9907 Segmental and somatic dysfunction of upper extremity: Secondary | ICD-10-CM | POA: Diagnosis not present

## 2024-08-11 DIAGNOSIS — M9902 Segmental and somatic dysfunction of thoracic region: Secondary | ICD-10-CM | POA: Diagnosis not present

## 2024-08-12 DIAGNOSIS — M9902 Segmental and somatic dysfunction of thoracic region: Secondary | ICD-10-CM | POA: Diagnosis not present

## 2024-08-12 DIAGNOSIS — M9901 Segmental and somatic dysfunction of cervical region: Secondary | ICD-10-CM | POA: Diagnosis not present

## 2024-08-12 DIAGNOSIS — M9903 Segmental and somatic dysfunction of lumbar region: Secondary | ICD-10-CM | POA: Diagnosis not present

## 2024-08-12 DIAGNOSIS — M9907 Segmental and somatic dysfunction of upper extremity: Secondary | ICD-10-CM | POA: Diagnosis not present

## 2024-08-12 DIAGNOSIS — M542 Cervicalgia: Secondary | ICD-10-CM | POA: Diagnosis not present

## 2024-08-14 DIAGNOSIS — M9901 Segmental and somatic dysfunction of cervical region: Secondary | ICD-10-CM | POA: Diagnosis not present

## 2024-08-14 DIAGNOSIS — M9903 Segmental and somatic dysfunction of lumbar region: Secondary | ICD-10-CM | POA: Diagnosis not present

## 2024-08-14 DIAGNOSIS — M9902 Segmental and somatic dysfunction of thoracic region: Secondary | ICD-10-CM | POA: Diagnosis not present

## 2024-08-14 DIAGNOSIS — M542 Cervicalgia: Secondary | ICD-10-CM | POA: Diagnosis not present

## 2024-08-14 DIAGNOSIS — M9907 Segmental and somatic dysfunction of upper extremity: Secondary | ICD-10-CM | POA: Diagnosis not present

## 2024-08-22 DIAGNOSIS — Z23 Encounter for immunization: Secondary | ICD-10-CM | POA: Diagnosis not present

## 2024-10-10 ENCOUNTER — Ambulatory Visit
Admission: EM | Admit: 2024-10-10 | Discharge: 2024-10-10 | Disposition: A | Discharge status: Home / Self Care | Attending: Family Medicine | Admitting: Family Medicine

## 2024-10-10 DIAGNOSIS — J069 Acute upper respiratory infection, unspecified: Secondary | ICD-10-CM | POA: Diagnosis not present

## 2024-10-10 MED ORDER — PREDNISONE 20 MG PO TABS
40.0000 mg | ORAL_TABLET | Freq: Every day | ORAL | 0 refills | Status: AC
Start: 2024-10-10 — End: ?

## 2024-10-10 MED ORDER — PROMETHAZINE-DM 6.25-15 MG/5ML PO SYRP: 5.0000 mL | ORAL_SOLUTION | Freq: Four times a day (QID) | ORAL | 0 refills | Status: AC | PRN

## 2024-10-10 MED ORDER — AZELASTINE HCL 0.1 % NA SOLN: 1.0000 | Freq: Two times a day (BID) | NASAL | 0 refills | Status: AC

## 2024-10-10 NOTE — ED Provider Notes (Signed)
 [facility]    CSN: [id number] Arrival date & time: 10/10/24  0909      History   Chief Complaint No chief complaint on file.   HPI Virginia [name] is a 78 y.o. female.   Patient presenting today with 5-day history of nasal congestion, sinus pressure, hacking cough, scratchy throat.  Denies fever, chills, chest pain, shortness of breath, abdominal pain, vomiting, diarrhea.  No known history of chronic pulmonary disease.  So far not trying anything over-the-counter for symptoms.    Past Medical History:  Diagnosis Date   Anxiety    Depression    HTN (hypertension)    resolved with weight loss s/p gastric bypass   Hypothyroidism    Since at least 2007   Osteoporosis    RLS (restless legs syndrome)    Sleep apnea    Stress fracture of left shoulder    Stroke (HCC)    2013/February 2017   Urticaria     Patient Active Problem List   Diagnosis Date Noted   Diarrhea 05/13/2024   Increased urinary frequency 05/13/2024   Fatty stools 05/13/2024   Elevated alkaline phosphatase level 11/24/2022   Palpitations 04/14/2022   Tachycardia 04/14/2022   Glenohumeral arthritis, left 09/29/2021   Intolerance of continuous positive airway pressure (CPAP) ventilation 02/17/2021   Status post bariatric surgery 02/17/2021   Periodic limb movement disorder (PLMD) 02/17/2021   OSA (obstructive sleep apnea) 02/17/2021   RLS (restless legs syndrome) 01/22/2018   History of colonic polyps    Constipation 05/11/2016   Gait difficulty 01/04/2016   Aphasia 01/04/2016   Acute CVA (cerebrovascular accident) (HCC) 01/04/2016   Cerebral infarction (HCC) 01/04/2016   Ataxia    OSA on CPAP 12/02/2013   CTS (carpal tunnel syndrome) 03/18/2013   Wrist fracture 02/04/2013   Distal radius fracture 12/26/2012   Hyperlipidemia 06/30/2012   Elevated blood pressure reading without diagnosis of hypertension 06/29/2012   Stroke (HCC) 06/28/2012   Hypothyroidism 06/28/2012   Anxiety  06/28/2012   History of colonic polyps 04/26/2011   Family history of colonic polyps 04/26/2011   ARTHRITIS, LEFT KNEE 06/06/2010   DERANGEMENT OF POSTERIOR HORN OF MEDIAL MENISCUS 06/06/2010   ANEMIA, B12 DEFICIENCY 02/07/2010   TRIGGER FINGER, THUMB 02/07/2010   SHOULDER PAIN 10/14/2008   BURSITIS, SHOULDER 10/14/2008   TRIGGER FINGER 06/15/2008    Past Surgical History:  Procedure Laterality Date   BREAST BIOPSY Right    benign   CATARACT EXTRACTION Bilateral 2018   CHOLECYSTECTOMY     COLONOSCOPY  2004   internal hemorrhoids, left-sided diverticulae, splenic flexure polyp (6mm), path unavailable at this time   COLONOSCOPY  04/2011   [facility]: Internal and external hemorrhoids, rectal tubular adenoma removed, left-sided diverticulosis   COLONOSCOPY N/A 06/01/2016   Procedure: COLONOSCOPY;  Surgeon: Lamar CHRISTELLA Hollingshead, MD;  Location: [facility];  Service: Endoscopy;  Laterality: N/A;  0830   COLONOSCOPY WITH PROPOFOL  N/A 07/06/2022   Procedure: COLONOSCOPY WITH PROPOFOL ;  Surgeon: Hollingshead Lamar CHRISTELLA, MD;  Location: [facility];  Service: Endoscopy;  Laterality: N/A;  10:15am   ESOPHAGOGASTRODUODENOSCOPY  2004   small hiatal hernia   GASTRIC ROUX-EN-Y  2006   REVERSE SHOULDER ARTHROPLASTY Left 09/29/2021   Procedure: LEFT REVERSE SHOULDER ARTHROPLASTY;  Surgeon: Onesimo Oneil LABOR, MD;  Location: [facility];  Service: Orthopedics;  Laterality: Left;    OB History   No obstetric history on file.      Home Medications    Prior  to Admission medications   Medication Sig Start Date End Date Taking? Authorizing Provider  azelastine (ASTELIN) 0.1 % nasal spray Place 1 spray into both nostrils 2 (two) times daily. Use in each nostril as directed 10/10/24  Yes Stuart Vernell Norris, PA-C  predniSONE (DELTASONE) 20 MG tablet Take 2 tablets (40 mg total) by mouth daily with breakfast. 10/10/24  Yes Stuart Vernell Norris, PA-C  promethazine-dextromethorphan (PROMETHAZINE-DM) 6.25-15 MG/5ML syrup  Take 5 mLs by mouth 4 (four) times daily as needed. 10/10/24  Yes Stuart Vernell Norris, PA-C  ALPRAZolam  (XANAX ) 0.5 MG tablet Take 0.5 mg by mouth See admin instructions. Take 0.5 mg at bedtime, may take 0.5 mg dose 2 times daily as needed for anxiety Patient taking differently: Take 0.5 mg by mouth at bedtime as needed for anxiety. Take 0.5 mg at bedtime, may take 0.5 mg dose 2 times daily as needed for anxiety 03/27/11   [provider]  amLODipine (NORVASC) 2.5 MG tablet Take 2.5 mg by mouth daily.    [provider]  Ascorbic Acid (VITAMIN C) 1000 MG tablet Take 1,000 mg by mouth daily.    [provider]  aspirin  EC 81 MG tablet Take 81 mg by mouth daily.    [provider]  atorvastatin  (LIPITOR) 10 MG tablet Take 1 tablet (10 mg total) by mouth daily. 01/22/18   Dohmeier, Dedra, MD  B Complex-C (SUPER B COMPLEX PO) Take 1 tablet by mouth daily.    [provider]  Cholecalciferol  (VITAMIN D ) 50 MCG (2000 UT) CAPS Take 2,000 Units by mouth daily.    [provider]  DULoxetine  (CYMBALTA ) 60 MG capsule Take 60 mg by mouth daily.    [provider]  ipratropium (ATROVENT ) 0.03 % nasal spray Place 2 sprays into both nostrils 3 (three) times daily as needed for rhinitis. 03/14/23   Iva Marty Saltness, MD  losartan  (COZAAR ) 100 MG tablet TAKE 1 TABLET BY MOUTH EVERY DAY 08/05/19   Charls Pearla LABOR, MD  metoprolol  tartrate (LOPRESSOR ) 25 MG tablet Take 1/2 to 1 Tablet as needed 07/03/23   Parthenia Olivia HERO, PA-C  Multiple Vitamins-Minerals (ICAPS AREDS 2 PO) Take 1 capsule by mouth in the morning and at bedtime.    [provider]  Multiple Vitamins-Minerals (MULTIVITAMIN WITH MINERALS) tablet Take 1 tablet by mouth daily.    [provider]  rOPINIRole  (REQUIP ) 1 MG tablet May take 0.5 mg in morning and 0.5 mg at bedtime OR take 1 tablet at Bedtime 06/17/24   Dohmeier, Dedra, MD  SYNTHROID  150 MCG tablet Take 1  tablet (150 mcg total) by mouth daily before breakfast. 07/20/23   Trixie File, MD  ursodiol (ACTIGALL) 250 MG tablet Take by mouth in the morning and at bedtime. 2 tablets two times a day Patient not taking: Reported on 05/13/2024    [provider]    Family History Family History  Problem Relation Age of Onset   Eczema Mother    Stroke Mother    Lung cancer Father    Stroke Father    Stroke Brother    Hypertension Brother    Stroke Maternal Grandmother    Stroke Maternal Grandfather    Allergic rhinitis Daughter    Cancer Daughter    Cancer Other        family history    Coronary artery disease Other        family history    Arthritis Other  family history    Sleep apnea Neg Hx     Social History Social History   Tobacco Use   Smoking status: Former    Current packs/day: 0.00    Average packs/day: 1.5 packs/day for 20.0 years (30.0 ttl pk-yrs)    Types: Cigarettes    Start date: 11/28/1967    Quit date: 11/28/1987    Years since quitting: 36.8    Passive exposure: Never   Smokeless tobacco: Never   Tobacco comments:    Quit x 25 plus years  Vaping Use   Vaping status: Never Used  Substance Use Topics   Alcohol use: No    Alcohol/week: 0.0 standard drinks of alcohol   Drug use: No     Allergies   Augmentin [amoxicillin-pot clavulanate]   Review of Systems Review of Systems Per HPI  Physical Exam Triage Vital Signs ED Triage Vitals  Encounter Vitals Group     BP 10/10/24 0925 120/76     Girls Systolic BP Percentile --      Girls Diastolic BP Percentile --      Boys Systolic BP Percentile --      Boys Diastolic BP Percentile --      Pulse Rate 10/10/24 0925 93     Resp 10/10/24 0925 16     Temp 10/10/24 0925 98.2 F (36.8 C)     Temp Source 10/10/24 0925 Oral     SpO2 10/10/24 0925 98 %     Weight --      Height --      Head Circumference --      Peak Flow --      Pain Score 10/10/24 0926 0     Pain Loc --      Pain  Education --      Exclude from Growth Chart --    No data found.  Updated Vital Signs BP 120/76 (BP Location: Right Arm)   Pulse 93   Temp 98.2 F (36.8 C) (Oral)   Resp 16   SpO2 98%   Visual Acuity Right Eye Distance:   Left Eye Distance:   Bilateral Distance:    Right Eye Near:   Left Eye Near:    Bilateral Near:     Physical Exam Vitals and nursing note reviewed.  Constitutional:      Appearance: Normal appearance.  HENT:     Head: Atraumatic.     Right Ear: Tympanic membrane and external ear normal.     Left Ear: Tympanic membrane and external ear normal.     Nose: Rhinorrhea present.     Mouth/Throat:     Mouth: Mucous membranes are moist.     Pharynx: Posterior oropharyngeal erythema present.  Eyes:     Extraocular Movements: Extraocular movements intact.     Conjunctiva/sclera: Conjunctivae normal.  Cardiovascular:     Rate and Rhythm: Normal rate and regular rhythm.     Heart sounds: Normal heart sounds.  Pulmonary:     Effort: Pulmonary effort is normal.     Breath sounds: Normal breath sounds. No wheezing or rales.  Musculoskeletal:        General: Normal range of motion.     Cervical back: Normal range of motion and neck supple.  Skin:    General: Skin is warm and dry.  Neurological:     Mental Status: She is alert and oriented to person, place, and time.  Psychiatric:        Mood and Affect:  Mood normal.        Thought Content: Thought content normal.      UC Treatments / Results  Labs (all labs ordered are listed, but only abnormal results are displayed) Labs Reviewed - No data to display  EKG   Radiology No results found.  Procedures Procedures (including critical care time)  Medications Ordered in UC Medications - No data to display  Initial Impression / Assessment and Plan / UC Course  I have reviewed the triage vital signs and the nursing notes.  Pertinent labs & imaging results that were available during my care of the  patient were reviewed by me and considered in my medical decision making (see chart for details).     Consistent with viral upper respiratory infection.  Vitals and exam very reassuring today, no evidence of secondary bacterial infection at this time.  Given lingering symptoms particularly inflammatory cough, will treat with several day course of prednisone in addition to Astelin, Phenergan DM, supportive over-the-counter medications and home care.  Return for worsening or unresolving symptoms.   Final Clinical Impressions(s) / UC Diagnoses   Final diagnoses:  Viral URI with cough     Discharge Instructions      In addition to the prescribed medications, you may use Coricidin HBP, saline sinus rinses, Flonase nasal spray, humidifiers, drinking plenty of fluids and getting lots of rest.  Follow-up for significantly worsening symptoms    ED Prescriptions     Medication Sig Dispense Auth. Provider   predniSONE (DELTASONE) 20 MG tablet Take 2 tablets (40 mg total) by mouth daily with breakfast. 6 tablet Stuart Vernell Norris, PA-C   azelastine (ASTELIN) 0.1 % nasal spray Place 1 spray into both nostrils 2 (two) times daily. Use in each nostril as directed 30 mL Stuart Vernell Norris, PA-C   promethazine-dextromethorphan (PROMETHAZINE-DM) 6.25-15 MG/5ML syrup Take 5 mLs by mouth 4 (four) times daily as needed. 100 mL Stuart Vernell Norris, [place]      PDMP not reviewed this encounter.   Stuart Vernell Norris, [place] 10/10/24 1015

## 2024-10-10 NOTE — ED Triage Notes (Signed)
 Pt reports she has sinus pressure, cough, and a sore throat x 5 days

## 2024-10-10 NOTE — Discharge Instructions (Signed)
 In addition to the prescribed medications, you may use Coricidin HBP, saline sinus rinses, Flonase nasal spray, humidifiers, drinking plenty of fluids and getting lots of rest.  Follow-up for significantly worsening symptoms

## 2024-10-30 DIAGNOSIS — J40 Bronchitis, not specified as acute or chronic: Secondary | ICD-10-CM | POA: Diagnosis not present

## 2024-10-30 DIAGNOSIS — J45909 Unspecified asthma, uncomplicated: Secondary | ICD-10-CM | POA: Diagnosis not present

## 2024-11-27 ENCOUNTER — Encounter: Payer: Self-pay | Admitting: Gastroenterology

## 2024-12-17 ENCOUNTER — Ambulatory Visit: Admitting: Internal Medicine

## 2025-01-08 ENCOUNTER — Ambulatory Visit: Admitting: Internal Medicine

## 2025-03-18 ENCOUNTER — Encounter (INDEPENDENT_AMBULATORY_CARE_PROVIDER_SITE_OTHER): Admitting: Ophthalmology

## 2025-04-30 ENCOUNTER — Ambulatory Visit: Admitting: Adult Health
